# Patient Record
Sex: Female | Born: 1935 | Race: White | Hispanic: No | Smoking: Former smoker
Health system: Southern US, Community
[De-identification: ages and names within clinical notes are randomized; demographics above are authoritative.]

## PROBLEM LIST (undated history)

## (undated) DIAGNOSIS — K219 Gastro-esophageal reflux disease without esophagitis: Secondary | ICD-10-CM

## (undated) DIAGNOSIS — K589 Irritable bowel syndrome without diarrhea: Secondary | ICD-10-CM

## (undated) DIAGNOSIS — I2510 Atherosclerotic heart disease of native coronary artery without angina pectoris: Secondary | ICD-10-CM

## (undated) DIAGNOSIS — F0390 Unspecified dementia without behavioral disturbance: Secondary | ICD-10-CM

## (undated) DIAGNOSIS — G250 Essential tremor: Secondary | ICD-10-CM

## (undated) DIAGNOSIS — I10 Essential (primary) hypertension: Secondary | ICD-10-CM

## (undated) DIAGNOSIS — L409 Psoriasis, unspecified: Secondary | ICD-10-CM

## (undated) DIAGNOSIS — I4891 Unspecified atrial fibrillation: Secondary | ICD-10-CM

## (undated) DIAGNOSIS — E785 Hyperlipidemia, unspecified: Secondary | ICD-10-CM

## (undated) HISTORY — DX: Unspecified dementia, unspecified severity, without behavioral disturbance, psychotic disturbance, mood disturbance, and anxiety: F03.90

## (undated) HISTORY — PX: EYE SURGERY: SHX253

## (undated) HISTORY — PX: OTHER SURGICAL HISTORY: SHX169

## (undated) HISTORY — DX: Gastro-esophageal reflux disease without esophagitis: K21.9

## (undated) HISTORY — PX: DOPPLER ECHOCARDIOGRAPHY: SHX263

## (undated) HISTORY — DX: Hyperlipidemia, unspecified: E78.5

## (undated) HISTORY — DX: Atherosclerotic heart disease of native coronary artery without angina pectoris: I25.10

## (undated) HISTORY — PX: CARDIAC SURGERY: SHX584

## (undated) HISTORY — PX: ROTATOR CUFF REPAIR: SHX139

## (undated) HISTORY — DX: Essential tremor: G25.0

---

## 1992-05-17 HISTORY — PX: CORONARY ARTERY BYPASS GRAFT: SHX141

## 1999-12-11 ENCOUNTER — Encounter: Admission: RE | Admit: 1999-12-11 | Discharge: 1999-12-11 | Payer: Self-pay | Admitting: Obstetrics and Gynecology

## 1999-12-11 ENCOUNTER — Encounter: Payer: Self-pay | Admitting: Obstetrics and Gynecology

## 1999-12-30 ENCOUNTER — Ambulatory Visit (HOSPITAL_COMMUNITY): Admission: RE | Admit: 1999-12-30 | Discharge: 1999-12-30 | Payer: Self-pay | Admitting: Family Medicine

## 2000-09-29 ENCOUNTER — Encounter: Admission: RE | Admit: 2000-09-29 | Discharge: 2000-09-29 | Payer: Self-pay | Admitting: Orthopedic Surgery

## 2000-10-04 ENCOUNTER — Encounter: Admission: RE | Admit: 2000-10-04 | Discharge: 2000-10-04 | Payer: Self-pay | Admitting: Orthopedic Surgery

## 2000-10-04 ENCOUNTER — Encounter: Payer: Self-pay | Admitting: Orthopedic Surgery

## 2001-02-17 ENCOUNTER — Encounter: Admission: RE | Admit: 2001-02-17 | Discharge: 2001-02-17 | Payer: Self-pay | Admitting: Family Medicine

## 2001-02-17 ENCOUNTER — Encounter: Payer: Self-pay | Admitting: Family Medicine

## 2001-02-27 ENCOUNTER — Encounter: Payer: Self-pay | Admitting: Orthopedic Surgery

## 2001-02-27 ENCOUNTER — Encounter: Admission: RE | Admit: 2001-02-27 | Discharge: 2001-02-27 | Payer: Self-pay | Admitting: Orthopedic Surgery

## 2001-02-28 ENCOUNTER — Ambulatory Visit (HOSPITAL_BASED_OUTPATIENT_CLINIC_OR_DEPARTMENT_OTHER): Admission: RE | Admit: 2001-02-28 | Discharge: 2001-03-01 | Payer: Self-pay | Admitting: Orthopedic Surgery

## 2001-03-08 ENCOUNTER — Encounter: Admission: RE | Admit: 2001-03-08 | Discharge: 2001-05-01 | Payer: Self-pay | Admitting: Orthopedic Surgery

## 2002-02-23 ENCOUNTER — Encounter: Admission: RE | Admit: 2002-02-23 | Discharge: 2002-02-23 | Payer: Self-pay | Admitting: Family Medicine

## 2002-02-23 ENCOUNTER — Encounter: Payer: Self-pay | Admitting: Family Medicine

## 2002-05-31 ENCOUNTER — Ambulatory Visit (HOSPITAL_COMMUNITY): Admission: RE | Admit: 2002-05-31 | Discharge: 2002-05-31 | Payer: Self-pay | Admitting: *Deleted

## 2003-06-11 ENCOUNTER — Other Ambulatory Visit: Admission: RE | Admit: 2003-06-11 | Discharge: 2003-06-11 | Payer: Self-pay | Admitting: Obstetrics & Gynecology

## 2004-01-10 ENCOUNTER — Encounter: Admission: RE | Admit: 2004-01-10 | Discharge: 2004-01-10 | Payer: Self-pay | Admitting: Orthopedic Surgery

## 2004-03-10 ENCOUNTER — Encounter: Admission: RE | Admit: 2004-03-10 | Discharge: 2004-03-17 | Payer: Self-pay | Admitting: Orthopedic Surgery

## 2006-11-04 ENCOUNTER — Other Ambulatory Visit: Admission: RE | Admit: 2006-11-04 | Discharge: 2006-11-04 | Payer: Self-pay | Admitting: Family Medicine

## 2006-11-22 ENCOUNTER — Inpatient Hospital Stay (HOSPITAL_COMMUNITY): Admission: EM | Admit: 2006-11-22 | Discharge: 2006-11-25 | Payer: Self-pay | Admitting: Emergency Medicine

## 2006-11-23 ENCOUNTER — Ambulatory Visit: Payer: Self-pay | Admitting: Vascular Surgery

## 2006-11-23 ENCOUNTER — Encounter (INDEPENDENT_AMBULATORY_CARE_PROVIDER_SITE_OTHER): Payer: Self-pay | Admitting: Internal Medicine

## 2006-12-28 HISTORY — PX: OTHER SURGICAL HISTORY: SHX169

## 2007-07-14 ENCOUNTER — Encounter: Admission: RE | Admit: 2007-07-14 | Discharge: 2007-07-14 | Payer: Self-pay | Admitting: Orthopedic Surgery

## 2008-02-15 ENCOUNTER — Ambulatory Visit (HOSPITAL_COMMUNITY): Admission: RE | Admit: 2008-02-15 | Discharge: 2008-02-15 | Payer: Self-pay | Admitting: Obstetrics and Gynecology

## 2008-10-25 ENCOUNTER — Encounter: Admission: RE | Admit: 2008-10-25 | Discharge: 2008-10-25 | Payer: Self-pay | Admitting: Allergy and Immunology

## 2008-12-26 ENCOUNTER — Encounter: Admission: RE | Admit: 2008-12-26 | Discharge: 2009-02-10 | Payer: Self-pay | Admitting: Specialist

## 2009-07-12 ENCOUNTER — Inpatient Hospital Stay (HOSPITAL_COMMUNITY): Admission: EM | Admit: 2009-07-12 | Discharge: 2009-07-14 | Payer: Self-pay | Admitting: Emergency Medicine

## 2009-07-14 ENCOUNTER — Encounter (INDEPENDENT_AMBULATORY_CARE_PROVIDER_SITE_OTHER): Payer: Self-pay | Admitting: Internal Medicine

## 2009-07-14 ENCOUNTER — Ambulatory Visit: Payer: Self-pay | Admitting: Vascular Surgery

## 2009-07-14 ENCOUNTER — Encounter: Payer: Self-pay | Admitting: Internal Medicine

## 2010-08-05 LAB — DIFFERENTIAL
Basophils Absolute: 0 10*3/uL (ref 0.0–0.1)
Basophils Relative: 0 % (ref 0–1)
Eosinophils Absolute: 0.2 10*3/uL (ref 0.0–0.7)
Eosinophils Relative: 2 % (ref 0–5)
Lymphocytes Relative: 22 % (ref 12–46)
Lymphs Abs: 2.2 10*3/uL (ref 0.7–4.0)
Monocytes Absolute: 1 10*3/uL (ref 0.1–1.0)
Monocytes Relative: 10 % (ref 3–12)
Neutro Abs: 6.6 10*3/uL (ref 1.7–7.7)
Neutrophils Relative %: 65 % (ref 43–77)

## 2010-08-05 LAB — CK TOTAL AND CKMB (NOT AT ARMC)
CK, MB: 1.5 ng/mL (ref 0.3–4.0)
CK, MB: 2 ng/mL (ref 0.3–4.0)
CK, MB: 2.3 ng/mL (ref 0.3–4.0)
Relative Index: 1.5 (ref 0.0–2.5)
Relative Index: 1.7 (ref 0.0–2.5)
Relative Index: INVALID (ref 0.0–2.5)
Total CK: 134 U/L (ref 7–177)
Total CK: 139 U/L (ref 7–177)
Total CK: 99 U/L (ref 7–177)

## 2010-08-05 LAB — CLOSTRIDIUM DIFFICILE EIA
C difficile Toxins A+B, EIA: NEGATIVE
C difficile Toxins A+B, EIA: NEGATIVE
C difficile Toxins A+B, EIA: NEGATIVE

## 2010-08-05 LAB — BASIC METABOLIC PANEL
BUN: 21 mg/dL (ref 6–23)
CO2: 22 mEq/L (ref 19–32)
Calcium: 8.6 mg/dL (ref 8.4–10.5)
Chloride: 109 mEq/L (ref 96–112)
Creatinine, Ser: 0.83 mg/dL (ref 0.4–1.2)
GFR calc Af Amer: >60 mL/min (ref >60)
GFR calc non Af Amer: >60 mL/min (ref >60)
Glucose, Bld: 98 mg/dL (ref 70–99)
Potassium: 4.3 mEq/L (ref 3.5–5.1)
Sodium: 139 mEq/L (ref 135–145)

## 2010-08-05 LAB — URINALYSIS, ROUTINE W REFLEX MICROSCOPIC
Bilirubin Urine: NEGATIVE
Glucose, UA: NEGATIVE mg/dL
Ketones, ur: NEGATIVE mg/dL
Nitrite: NEGATIVE
Protein, ur: NEGATIVE mg/dL
Specific Gravity, Urine: 1.01 (ref 1.005–1.030)
Urobilinogen, UA: 0.2 mg/dL (ref 0.0–1.0)
pH: 5 (ref 5.0–8.0)

## 2010-08-05 LAB — CBC
HCT: 37.5 % (ref 36.0–46.0)
Hemoglobin: 12.9 g/dL (ref 12.0–15.0)
MCHC: 34.3 g/dL (ref 30.0–36.0)
MCV: 88.5 fL (ref 78.0–100.0)
Platelets: 221 10*3/uL (ref 150–400)
RBC: 4.24 MIL/uL (ref 3.87–5.11)
RDW: 15.3 % (ref 11.5–15.5)
WBC: 10.1 10*3/uL (ref 4.0–10.5)

## 2010-08-05 LAB — POCT CARDIAC MARKERS
CKMB, poc: 1.3 ng/mL (ref 1.0–8.0)
CKMB, poc: 2.8 ng/mL (ref 1.0–8.0)
Myoglobin, poc: 76.9 ng/mL (ref 12–200)
Myoglobin, poc: 77.8 ng/mL (ref 12–200)
Troponin i, poc: <0.05 ng/mL (ref 0.00–0.09)
Troponin i, poc: <0.05 ng/mL (ref 0.00–0.09)

## 2010-08-05 LAB — URINE CULTURE
Colony Count: NO GROWTH
Culture: NO GROWTH
Special Requests: NEGATIVE

## 2010-08-05 LAB — GLUCOSE, CAPILLARY
Glucose-Capillary: 106 mg/dL — ABNORMAL HIGH (ref 70–99)
Glucose-Capillary: 132 mg/dL — ABNORMAL HIGH (ref 70–99)

## 2010-08-05 LAB — HEMOGLOBIN A1C
Hgb A1c MFr Bld: 5.3 % (ref 4.6–6.1)
Mean Plasma Glucose: 105 mg/dL

## 2010-08-05 LAB — TROPONIN I
Troponin I: 0.02 ng/mL (ref 0.00–0.06)
Troponin I: <0.01 ng/mL (ref 0.00–0.06)
Troponin I: <0.01 ng/mL (ref 0.00–0.06)

## 2010-08-05 LAB — ETHANOL: Alcohol, Ethyl (B): <5 mg/dL (ref 0–10)

## 2010-08-05 LAB — TSH: TSH: 1.111 u[IU]/mL (ref 0.350–4.500)

## 2010-08-05 LAB — URINE MICROSCOPIC-ADD ON

## 2010-09-29 NOTE — H&P (Signed)
Tammy [name]                 ACCOUNT NO.:  [id number]   MEDICAL RECORD NO.:  [id number]          PATIENT TYPE:  INP   LOCATION:  1424                         FACILITY:  [facility]   PHYSICIAN:  Tammy [name], M.D.DATE OF BIRTH:  [birth year]   DATE OF ADMISSION:  11/22/2006  DATE OF DISCHARGE:                              HISTORY & PHYSICAL   ATTENDING PHYSICIAN:  Tammy [name], M.D.   PRIMARY CARE PHYSICIAN:  Tammy [name], M.D.   CHIEF COMPLAINT:  Weakness.   HISTORY OF PRESENT ILLNESS:  Patient is a 77 year old white female with  a past medical history of heavy binge-drinking alcohol, and hypertension  who reportedly has been having problems with falls for the past few days  to weeks prior to the incident that occurred last Thursday.  She has  been evaluated, and it is unclear at this time what the results of the  evaluations are, whether it is deconditioning, weakness, orthostatics,  or secondary to secretive alcohol abuse.  However, she has been seeing  Dr. Nicki [name], who is concerned that the patient is having sleep  apnea, which may or may not be related to these falls, because it has  been reported that her heart rate and blood pressure have been elevating  quite highly in the middle of the night.  Patient tells me in regards to  her alcohol abuse, that she may go several days without drinking but can  drink up to two bottles of wine a day.  Last Thursday, she says that she  got weak and she fell.  She did not actually pass out, but she was so  weak she was unable to stand and actually remained in the sitting  position for several days, unable to stand.  A workman who saw her from  outside the house, broke into the home and was able to get her up in the  bed, called family, who stayed with the family.  Said she was feeling  better but then reportedly had another fall, which finally made them  bring her into the emergency room.  In the emergency room, she was  noted  to have extensive bruising but x-ray showed no evidence of any type of  fractures.   Labs of note were a sodium of 126, potassium 3, BUN 45, creatinine 1.6.  She was also found to have a glucose of 160.  Albumin level was low at  2.6, and a CPK level was elevated in the mid 400s, although the MB and  troponin were normal.   Currently, the patient is doing well.  She complains of some generalized  soreness but is feeling better.  She denies any headaches, vision  changes, dysphagia, chest pain, palpitations, shortness of breath,  wheeze, cough, abdominal pain, hematuria, dysuria, constipation,  diarrhea.  She has general significant soreness all throughout her body,  most notably in her leg.  Review of systems is otherwise negative, and  described as above.   Patient's past medical history includes alcohol abuse and hypertension.  Possible sleep apnea, although she has yet to  complete an evaluation for  this.   MEDICATIONS:  Patient is on clobetasol, Dovonex, Fosamax, metoprolol,  Oxytrol, quinapril, and Zocor.   She has allergies to PENICILLIN.   SOCIAL HISTORY:  She denies any tobacco or drug use.  She does admit to  binge drinking, and when she does drink, she drinks about two bottles of  wine a day.   FAMILY HISTORY:  Noncontributory.   PHYSICAL EXAMINATION:  VITALS ON ADMISSION:  Temp 97.8, heart rate 97,  blood pressure 85/47 initially but then improved with IV fluids to  136/82, respirations 22, O2 sat 98% on room air.  GENERAL:  Patient is alert and oriented x3.  No apparent distress.  HEENT:  Normocephalic.  She has significant bruising all across her face  all the way down through the right side of her body.  Her mucous  membranes are dry.  NECK:  No carotid bruits.  HEART:  Regular rate and rhythm.  S1 and S2.  LUNGS:  Clear to auscultation bilaterally.  ABDOMEN:  Soft, nontender, nondistended.  Positive bowel sounds.  EXTREMITIES:  Quite sore to touch.   Difficult to screen for Denna Haggard' sign  with her generalized soreness.   LAB WORK:  White count is 7.9, H&H 10.8 and 30.9.  MCV of 83.  Platelet  count 251, 78% neutrophils.  Sodium 126, potassium 3, chloride 88,  bicarb 25, BUN 45, creatinine 1.6, glucose 159.  LFTs are noted for an  albumin of 2.6.  CPK 455, MB 4.2, troponin I less than 0.05.   X-RAY:  The x-ray of the head showed a few cerebral and cerebellar  atrophies but no acute abnormality.  No fractures of the facial bones,  paranasal sinuses, orbits.  C-spine normal.  Some bullous changes of the  lungs but no signs of any cervical fractures or otherwise.   ASSESSMENT/PLAN:  1. Fall with secondary weakness and mild rhabdomyolysis:  In regards      to the patient's rhabdomyolysis, will plan to aggressively hydrate      and follow CPK levels.  2. Acute renal failure:  See #1.  3. Fall:  The cause of these falls is unclear at this time.  She is      certainly weakened, however, post fall secondary to her      rhabdomyolysis and muscle enzyme breakdown.  We will plan to get PT      evaluation and out of bed with assistance, pain control.  Will also      check a bilateral lower extremity Doppler to rule out deep venous      thrombosis.  In regards to the fall itself, this may be from      alcohol drinking, which is the #1 possibility.  However, it may be      infection versus hypotension from blood pressure medications versus      a possible arrhythmia.  Given the fact that she has previously seen      Dr. Nicki [name], and he is screening her for sleep apnea, will      plan to at least place her on telemetry x24 hours to insure there      are no episodes of arrhythmia.  4. Alcohol abuse:  Patient tells me her last drink was about five days      ago for heavy alcohol, but she does admit      that after her first fall when she was moved back to her bed, she  drank a few beers, so while I am not going to put her on Ativan       withdrawal protocol, I have p.r.n. IV Ativan ordered in case she      does show signs of DT's.  5. Protein calorie malnutrition:  I feel that this is more chronic      secondary to her alcohol abuse.      Tammy [name], M.D.  Electronically Signed     SKK/MEDQ  D:  11/22/2006  T:  11/22/2006  Job:  [id number]   cc:   Tammy [name], M.D.  Fax: [phone]   Tammy [name], M.D.  Fax: [phone]

## 2010-09-29 NOTE — Discharge Summary (Signed)
Tammy [name], Tammy [name]                 ACCOUNT NO.:  [id number]   MEDICAL RECORD NO.:  [id number]          PATIENT TYPE:  INP   LOCATION:  1424                         [facility]ACILITY[facility]   PHYSICIAN:  Corinna L. Lendell Caprice, MDDATE OF BIRTH:  1930-10-13   DATE OF ADMISSION:  11/22/2006  DATE OF DISCHARGE:  11/25/2006                               DISCHARGE SUMMARY   DISCHARGE DIAGNOSES:  1. Recurrent falls and weakness.  2. Mild rhabdomyolysis.  3. Acute renal insufficiency, prerenal azotemia, dehydration.  4. Hyponatremia secondary to alcohol.  5. Hypokalemia secondary to alcohol.  6. Alcohol abuse.  7. Protein calorie malnutrition.  8. Multiple abrasions and contusions from frequent falling.  9. Anemia.   DISCHARGE MEDICATIONS:  Discharge medications are the same as upon  admission:  1. Clobetasol topical as needed.  2. Fosamax weekly.  3. Metoprolol 75 mg twice a day.  4. Oxytrol transdermal.  5. Quinapril, dose unknown.  6. Zocor 40 mg a day.   FOLLOW UP:  Follow up with Dr. Tenny Craw in 1-2 weeks at which time basic  metabolic panel and a total CPK should be checked as well as screening  for continued alcohol use.   DIET:  Regular.   ACTIVITY:  Fall precautions.  I am arranging home physical therapy, home  occupational therapy, and Child psychotherapist.   CONDITION ON DISCHARGE:  Stable.   CONSULTATIONS:  None.   PROCEDURES:  None.   PERTINENT LABORATORY DATA:  On admission, her sodium was 126, potassium  3.0, chloride 88, bicarbonate 25, glucose 159, BUN 45, creatinine 1.6,  albumin 2.6.  SGOT 51, SGPT 48, otherwise normal.  At discharge, her  potassium is 4.0, sodium 138, glucose 130, magnesium 1.8.  Initial CBC  was significant for a hemoglobin of 10.8, hematocrit 30.9.  Hemoglobin  at discharge is 8.8 after hydration, normal platelet count.  Initial  myoglobin was 400.  Initial total CPK was 475.  At discharge, her  creatinine was 0.73 and BUN was 12.  B12 and folate levels  were  adequate.  TSH 0.764.  Urinalysis revealed trace leukocyte esterase,  negative nitrite, negative blood, negative ketones, negative glucose,  RPR nonreactive.   SPECIAL STUDIES AND RADIOLOGY:  EKG showed a lot of baseline artifact  but showed normal sinus rhythm.  Head CT without contrast showed  diffusely enlarged ventricles and subarachnoid spaces.  No fracture,  hemorrhage.  Maxillofacial CT showed no fracture.  CT of the C-spine  showed mild reversal of the normal cervical lordosis.  No fracture or  subluxation.  Mild degenerative changes.  Chest CT showed nothing acute.  Abdominal CT showed multiple sigmoid colon diverticula, nothing acute,  cholelithiasis, and a small amount of pericholecystic fluid.   HISTORY AND HOSPITAL COURSE:  Please see H&P for complete admission  details.  The patient is a 77 year old white female patient of Dr. Tenny Craw  who admits to heavy drinking and had been falling a lot recently.  She  lives alone and apparently fell and was unable to get out of bathtub.  Finally, a handyman found her and got her  into bed.  Apparently she fell  again, and they finally brought her to the emergency room several days  later.  She admitted drinking two bottles of wine a day and felt that  she went through DTs while stuck in the bathtub.  Her vital signs were  significant for an initial blood pressure 85/47 which normalized with IV  fluids.  She had bruising all over her face, arms, right side of her  body, legs and a subacute-appearing abrasion over her right cheek.  She  was alert and oriented.  Dry mucous membranes.  She was admitted to  telemetry and given IV fluids for her dehydration and her  rhabdomyolysis.  Her CPKs decreased.  Her sodium normalized.  Her  potassium was repleted.  She worked with physical therapy and  occupational therapy which recommended skilled nursing facility versus  home health physical therapy and occupational therapy.  She refused   placement.  I am concerned about her living alone, and I have told this  to her and her friend Becky Sax.  She asked me not to call her  daughter, so I did not, but apparently her friend is able to arrange at  least over a few days supervision either at the patient's house or the  patient may stay at her house.  She had no signs of DTs during  hospitalization.  All her outpatient medications were held initially.  I  do not feel that her mildly elevated CPKs and myoglobin were a result of  the Zocor, so I believe she can start this back, but she will need a  repeat CPK in a few weeks and if elevated consider stopping the Zocor.  I am confident that her mild rhabdomyolysis was from having been stuck  in the bathtub for 2 days and multiple other falls.  I asked the social  worker to discuss options with the patient with regard to her drinking  history.  She was not receptive to any suggestions of treatment or AA.  She minimized her drinking and rationalized that it, but I did encourage  her to abstain altogether.  She was slightly anemic on admission, and  with hydration her hemoglobin dropped, but she has no active bleeding.  I have ordered stool Hemoccults, but none have been done.  This may need  to be worked up as an outpatient as well.  The patient's outpatient  medications were able to be resumed.  At this time she has stable vital  signs, is ambulating on her own, and is stable for discharge.      Corinna L. Lendell Caprice, MD  Electronically Signed     CLS/MEDQ  D:  11/25/2006  T:  11/27/2006  Job:  [id number]   cc:   C. Duane Lope, M.D.  Fax: [phone]   Nicki Guadalajara, M.D.  Fax: [phone]

## 2010-10-02 NOTE — Op Note (Signed)
   NAME:  Tammy [name], Tammy [name]                           ACCOUNT NO.:  [id number]   MEDICAL RECORD NO.:  [id number]                   PATIENT TYPE:  AMB   LOCATION:  ENDO                                 FACILITY:  [facility]   PHYSICIAN:  Georgiana Spinner, M.D.                 DATE OF BIRTH:  09-29-1930   DATE OF PROCEDURE:  05/31/2002  DATE OF DISCHARGE:                                 OPERATIVE REPORT   PROCEDURE PERFORMED:  Colonoscopy.   ENDOSCOPIST:  Georgiana Spinner, M.D.   INDICATIONS FOR PROCEDURE:  Hemoccult positivity.   ANESTHESIA:  Demerol 70 mg, Versed 7 mg.   DESCRIPTION OF PROCEDURE:  With the patient mildly sedated in the left  lateral decubitus position, the Olympus video colonoscope was inserted in  the rectum and passed under direct vision to the cecum, identified by the  ileocecal valve and appendiceal orifice.  From this point the colonoscope  was slowly withdrawn taking circumferential views of the entire colonic  mucosa stopping only to photograph diverticulum seen in the sigmoid colon as  we reached the rectum which appeared normal on direct and showed hemorrhoids  on retroflex view.  The endoscope was straightened and withdrawn.  The  patient's vital signs and pulse oximeter remained stable.  The patient  tolerated the procedure well without apparent complications.   FINDINGS:  Internal hemorrhoids, diverticulosis of the sigmoid colon.  Otherwise unremarkable examination.   PLAN:  Repeat examination in five to 10 years.                                                  Georgiana Spinner, M.D.    GMO/MEDQ  D:  05/31/2002  T:  05/31/2002  Job:  [id number]

## 2010-10-02 NOTE — Op Note (Signed)
[place]  Patient:    Tammy [name], Tammy [name] Visit Number: [id number] MRN: [id number]          Service Type: DSU Location: [facility] Attending Physician:  Cornell Barman Dictated by:   Lenard Galloway Chaney Malling, M.D. Proc. Date: 02/28/01 Admit Date:  02/28/2001                             Operative Report  PREOPERATIVE DIAGNOSIS:  Rotator cuff tear, right shoulder.  POSTOPERATIVE DIAGNOSIS:  Rotator cuff tear, right shoulder.  OPERATION PERFORMED:  Diagnostic arthroscopy, right shoulder; debridement of frayed biceps tendon; open acromioplasty and open repair of rotator cuff tear right shoulder using a 6.5 mm biocorkscrew suture anchor from [employer].  SURGEON:  Lenard Galloway. Chaney Malling, M.D.  ANESTHESIA:  General.  DESCRIPTION OF PROCEDURE:  The patient was placed on the operating table in semisitting position with the right arm hanging down.  The retrograde ureteropyelogram and shoulder was prepped with DuraPrep and draped out in the usual manner.  The arthroscope was placed in the posterior portal and the glenohumeral joint was visualized.  The articular cartilage over the humeral head and the glenoid was absolutely normal.  There was slight fraying of the anterior labrum and as this approached the biceps there was some fraying but the base of the biceps was quite stable.  It was palpated with a probe and no SLAP lesion or other systemic abnormalities were noted.  This are was debrided somewhat with intra-articular shaver.  Attention was then turned to the rotator cuff.  Where the rotator cuff inserted into the humeral head, an obvious tear could be seen.  The arthroscope was then passed to the subacromial space.  The subacromial space was cleared and again the rotator cuff tear could be seen.  This could be palpated through an anterolateral portal and the tear of the cuff elevated allowing the probe to enter the glenohumeral joint itself.  Excellent  positioning of the tear was achieved. The MRI did not show a tear.  At this point it was felt that an open repair of the rotator cuff was indicated.  An incision was made over the anterolateral aspect of the shoulder.  A mini incision was used.  A self-retaining retractor was put in place.  Bleeders were coagulated.  Deltoid fibers were released off the anterior aspect of the acromion only.  The subacromial space was then opened and clearly visualized. The bursa was resected. The tear was clearly seen.  Using a power saw, a generous Neer anterior one third acromioplasty was done.  This gave excellent access to the shoulder joint itself.  The edges of the tear could be approximated and closed anatomically.  An Arthrex biocorkscrew with suture anchor was then inserted into the humeral head.  The two limbs of the sutures brought to the edge of the tear and this pulled down to an anatomic position and sutured.  A watertight closure was achieved.  Excellent reconstruction of the rotator cuff was achieved very nicely.  Throughout the procedure, the shoulder was irrigated with copious amounts of antibiotic solution.  The deltoid fibers were then reattached with heavy Vicryl sutures.  The anterior aspect of the acromion.  The subcutaneous tissues were closed with 2-0 Vicryl and stainless steel staples were used to close the skin.  The area of surgery was then infiltrated with Marcaine.  Sterile dressings were applied.  The patient was transferred to  the recovery room in excellent condition. Technically this procedure went extremely well.  DRAINS:  None.  COMPLICATIONS:  None.  The patient will stay overnight for pain control. Dictated by:   Lenard Galloway Chaney Malling, M.D. Attending Physician:  Cornell Barman DD:  02/28/01 TD:  02/28/01 Job: [id number]

## 2011-03-02 LAB — BASIC METABOLIC PANEL
BUN: 12
BUN: 29 — ABNORMAL HIGH
CO2: 25
CO2: 27
Calcium: 7.8 — ABNORMAL LOW
Calcium: 7.8 — ABNORMAL LOW
Chloride: 100
Chloride: 110
Creatinine, Ser: 0.73
Creatinine, Ser: 0.96
GFR calc Af Amer: >60
GFR calc Af Amer: >60
GFR calc non Af Amer: 57 — ABNORMAL LOW
GFR calc non Af Amer: >60
Glucose, Bld: 126 — ABNORMAL HIGH
Glucose, Bld: 130 — ABNORMAL HIGH
Potassium: 2.8 — ABNORMAL LOW
Potassium: 4
Sodium: 132 — ABNORMAL LOW
Sodium: 138

## 2011-03-02 LAB — CBC
HCT: 23.7 — ABNORMAL LOW
HCT: 30.9 — ABNORMAL LOW
Hemoglobin: 10.8 — ABNORMAL LOW
Hemoglobin: 8.3 — ABNORMAL LOW
MCHC: 34.9
MCHC: 35.2
MCV: 83.2
MCV: 83.4
Platelets: 176
Platelets: 251
RBC: 2.85 — ABNORMAL LOW
RBC: 3.7 — ABNORMAL LOW
RDW: 14.8 — ABNORMAL HIGH
RDW: 14.9 — ABNORMAL HIGH
WBC: 4.8
WBC: 7.9

## 2011-03-02 LAB — URINALYSIS, ROUTINE W REFLEX MICROSCOPIC
Bilirubin Urine: NEGATIVE
Glucose, UA: NEGATIVE
Hgb urine dipstick: NEGATIVE
Ketones, ur: NEGATIVE
Nitrite: NEGATIVE
Protein, ur: NEGATIVE
Specific Gravity, Urine: 1.008
Urobilinogen, UA: 0.2
pH: 5.5

## 2011-03-02 LAB — DIFFERENTIAL
Basophils Absolute: 0
Basophils Relative: 1
Eosinophils Absolute: 0
Eosinophils Relative: 0
Lymphocytes Relative: 6 — ABNORMAL LOW
Lymphs Abs: 0.5 — ABNORMAL LOW
Monocytes Absolute: 1.1 — ABNORMAL HIGH
Monocytes Relative: 15 — ABNORMAL HIGH
Neutro Abs: 6.2
Neutrophils Relative %: 78 — ABNORMAL HIGH

## 2011-03-02 LAB — COMPREHENSIVE METABOLIC PANEL
ALT: 48 — ABNORMAL HIGH
AST: 51 — ABNORMAL HIGH
Albumin: 2.6 — ABNORMAL LOW
Alkaline Phosphatase: 83
BUN: 45 — ABNORMAL HIGH
CO2: 25
Calcium: 8.7
Chloride: 88 — ABNORMAL LOW
Creatinine, Ser: 1.6 — ABNORMAL HIGH
GFR calc Af Amer: 38 — ABNORMAL LOW
GFR calc non Af Amer: 32 — ABNORMAL LOW
Glucose, Bld: 159 — ABNORMAL HIGH
Potassium: 3 — ABNORMAL LOW
Sodium: 126 — ABNORMAL LOW
Total Bilirubin: 1
Total Protein: 6.1

## 2011-03-02 LAB — URINE MICROSCOPIC-ADD ON

## 2011-03-02 LAB — POCT CARDIAC MARKERS
CKMB, poc: 4.2
CKMB, poc: 6.1
Myoglobin, poc: 400
Myoglobin, poc: 455
Operator id: 4661
Operator id: 4661
Troponin i, poc: <0.05
Troponin i, poc: <0.05

## 2011-03-02 LAB — CK TOTAL AND CKMB (NOT AT ARMC)
CK, MB: 4.4 — ABNORMAL HIGH
Relative Index: 1.7
Total CK: 252 — ABNORMAL HIGH

## 2011-03-02 LAB — MAGNESIUM: Magnesium: 1.8

## 2011-03-02 LAB — CK: Total CK: 475 — ABNORMAL HIGH

## 2011-03-02 LAB — HEMOGLOBIN AND HEMATOCRIT, BLOOD
HCT: 25 — ABNORMAL LOW
Hemoglobin: 8.8 — ABNORMAL LOW

## 2011-03-02 LAB — VITAMIN B12: Vitamin B-12: 1120 — ABNORMAL HIGH (ref 211–911)

## 2011-03-02 LAB — RPR: RPR Ser Ql: NONREACTIVE

## 2011-03-02 LAB — FOLATE RBC: RBC Folate: 1559 — ABNORMAL HIGH

## 2011-03-02 LAB — TSH: TSH: 0.764

## 2011-05-06 ENCOUNTER — Emergency Department (HOSPITAL_COMMUNITY)
Admission: EM | Admit: 2011-05-06 | Discharge: 2011-05-06 | Disposition: A | Discharge status: Home / Self Care | Payer: Medicare Other | Attending: Emergency Medicine | Admitting: Emergency Medicine

## 2011-05-06 ENCOUNTER — Emergency Department (HOSPITAL_COMMUNITY): Payer: Medicare Other

## 2011-05-06 DIAGNOSIS — S39012A Strain of muscle, fascia and tendon of lower back, initial encounter: Secondary | ICD-10-CM

## 2011-05-06 DIAGNOSIS — S52509A Unspecified fracture of the lower end of unspecified radius, initial encounter for closed fracture: Secondary | ICD-10-CM | POA: Insufficient documentation

## 2011-05-06 DIAGNOSIS — W010XXA Fall on same level from slipping, tripping and stumbling without subsequent striking against object, initial encounter: Secondary | ICD-10-CM | POA: Insufficient documentation

## 2011-05-06 DIAGNOSIS — E7800 Pure hypercholesterolemia, unspecified: Secondary | ICD-10-CM | POA: Insufficient documentation

## 2011-05-06 DIAGNOSIS — I10 Essential (primary) hypertension: Secondary | ICD-10-CM | POA: Insufficient documentation

## 2011-05-06 DIAGNOSIS — M25539 Pain in unspecified wrist: Secondary | ICD-10-CM | POA: Insufficient documentation

## 2011-05-06 DIAGNOSIS — S52609A Unspecified fracture of lower end of unspecified ulna, initial encounter for closed fracture: Secondary | ICD-10-CM | POA: Insufficient documentation

## 2011-05-06 DIAGNOSIS — M545 Low back pain, unspecified: Secondary | ICD-10-CM | POA: Insufficient documentation

## 2011-05-06 DIAGNOSIS — M25559 Pain in unspecified hip: Secondary | ICD-10-CM | POA: Insufficient documentation

## 2011-05-06 DIAGNOSIS — K219 Gastro-esophageal reflux disease without esophagitis: Secondary | ICD-10-CM | POA: Insufficient documentation

## 2011-05-06 DIAGNOSIS — J45909 Unspecified asthma, uncomplicated: Secondary | ICD-10-CM | POA: Insufficient documentation

## 2011-05-06 DIAGNOSIS — IMO0001 Reserved for inherently not codable concepts without codable children: Secondary | ICD-10-CM | POA: Insufficient documentation

## 2011-05-06 DIAGNOSIS — M25439 Effusion, unspecified wrist: Secondary | ICD-10-CM | POA: Insufficient documentation

## 2011-05-06 DIAGNOSIS — IMO0002 Reserved for concepts with insufficient information to code with codable children: Secondary | ICD-10-CM | POA: Insufficient documentation

## 2011-05-06 DIAGNOSIS — S52501A Unspecified fracture of the lower end of right radius, initial encounter for closed fracture: Secondary | ICD-10-CM

## 2011-05-06 DIAGNOSIS — W19XXXA Unspecified fall, initial encounter: Secondary | ICD-10-CM

## 2011-05-06 HISTORY — DX: Gastro-esophageal reflux disease without esophagitis: K21.9

## 2011-05-06 HISTORY — DX: Psoriasis, unspecified: L40.9

## 2011-05-06 HISTORY — DX: Essential (primary) hypertension: I10

## 2011-05-06 MED ORDER — HYDROCODONE-ACETAMINOPHEN 5-325 MG PO TABS
ORAL_TABLET | ORAL | Status: AC
  Administered 2011-05-06: 1 via ORAL
  Filled 2011-05-06: qty 1

## 2011-05-06 MED ORDER — HYDROCODONE-ACETAMINOPHEN 5-325 MG PO TABS
1.0000 | ORAL_TABLET | Freq: Once | ORAL | Status: AC
  Administered 2011-05-06: 1 via ORAL

## 2011-05-06 MED ORDER — HYDROCODONE-ACETAMINOPHEN 5-325 MG PO TABS: 1.0000 | ORAL_TABLET | ORAL | Status: AC | PRN

## 2011-05-06 NOTE — ED Notes (Signed)
R radial fx on xray...waiting for splinting

## 2011-05-06 NOTE — ED Provider Notes (Signed)
History     CSN: [id number]  Arrival date & time 05/06/11  1914   First MD Initiated Contact with Patient 05/06/11 [phone]      Chief Complaint  Patient presents with  . Fall  . Hip Pain  . Wrist Pain    (Consider location/radiation/quality/duration/timing/severity/associated sxs/prior treatment) Patient is a 77 y.o. female presenting with fall. The history is provided by the patient.  Fall The accident occurred 1 to 2 hours ago. The fall occurred while walking. She landed on a hard floor. There was no blood loss. The point of impact was the right hip and right wrist. The pain is present in the right wrist. The pain is at a severity of 7/10. The pain is moderate. She was ambulatory at the scene. Pertinent negatives include no bowel incontinence, no headaches and no tingling.  Pt states she is now having pain in right wrist and right buttocks. States she was able to get up after the fall. Denies head injury, denies headache, nausea, vomiting, abdominal pain. No other complaints.  Past Medical History  Diagnosis Date  . Hypertension   . Asthma   . Psoriasis   . High cholesterol   . Acid reflux     Past Surgical History  Procedure Date  . Cardiac surgery   . Eye surgery   . Rotator cuff repair     bilateral    No family history on file.  History  Substance Use Topics  . Smoking status: Not on file  . Smokeless tobacco: Not on file  . Alcohol Use: No    OB History    Grav Para Term Preterm Abortions TAB SAB Ect Mult Living                  Review of Systems  Constitutional: Negative.   HENT: Negative.   Eyes: Negative.   Respiratory: Negative.   Cardiovascular: Negative.   Gastrointestinal: Negative.  Negative for bowel incontinence.  Genitourinary: Negative.   Musculoskeletal: Positive for myalgias and joint swelling. Negative for back pain.  Skin: Positive for wound.  Neurological: Negative for dizziness, tingling, seizures, weakness, light-headedness and  headaches.  Psychiatric/Behavioral: Negative.     Allergies  Penicillins  Home Medications  No current outpatient prescriptions on file.  BP 163/64  Pulse 71  Temp(Src) 98.1 F (36.7 C) (Oral)  Resp 20  SpO2 97%  Physical Exam  Nursing note and vitals reviewed. Constitutional: She is oriented to person, place, and time. She appears well-developed and well-nourished. No distress.  HENT:  Head: Normocephalic and atraumatic.  Eyes: Conjunctivae and EOM are normal. Pupils are equal, round, and reactive to light.  Neck: Normal range of motion. Neck supple.  Cardiovascular: Normal rate, regular rhythm and normal heart sounds.   Pulmonary/Chest: Effort normal and breath sounds normal. No respiratory distress.  Abdominal: Soft. Bowel sounds are normal. There is no tenderness.  Musculoskeletal:       Tenderness to palpation over lumbar spine, No swelling, bruising, step offs. Pain can be reproduced with palpation over right buttocks into right hip. Pain with right hip flexion, internal and external rotation. Normal right knee. Swelling over right wrist. Small skin tear about 2cm to right dorsal wrist, hemostatic. Pain with palpation over right wrist, pain with flexion, extension, ulnar and radial deviation.  Neurological: She is alert and oriented to person, place, and time.       2+ and equal patellar reflexes bilaterally. Normal sensation of LE. Normal gait  Skin: Skin is warm and dry.  Psychiatric: She has a normal mood and affect.    ED Course  Procedures (including critical care time)  Pt denies getting dizzy, light headed, denies CP, SOB. States mechanical fall. Reports pain in the back, right hip, right wrist. Will get x-rays.  Dg Lumbar Spine Complete  05/06/2011  *RADIOLOGY REPORT*  Clinical Data: Status post fall  LUMBAR SPINE - COMPLETE 4+ VIEW  Comparison: 07/14/2007  Findings:  There is a mild curvature of the lumbar spine which is convex to the right.  Multilevel disc  space narrowing and ventral spurring is noted throughout the lumbar spine.  This is similar to previous exam.  A first degree anterolisthesis of L4 on L5 is noted.  There are no fractures or subluxations identified.  IMPRESSION:  1.  No acute findings. 2.  Lumbar spondylosis.  Original Report Authenticated By: Rosealee Albee, M.D.   Dg Wrist Complete Right  05/06/2011  *RADIOLOGY REPORT*  Clinical Data: Status post fall  RIGHT WRIST - COMPLETE 3+ VIEW  Comparison: None  Findings: There is a distal radius fracture.  There is mild lateral angulation of the distal fracture fragments.  No significant displacement.  Ulnar styloid fractures also noted.  Advanced degenerative changes are present within the basilar joint.  IMPRESSION:  1.  Distal radius fracture.  Per CMS PQRS reporting requirements (PQRS Measure 24): Given the patient's age of greater than 50 and the fracture site (hip, distal radius, or spine), the patient should be tested for osteoporosis using DXA, and the appropriate treatment considered based on the DXA results.  Original Report Authenticated By: Rosealee Albee, M.D.   Dg Hip Bilateral Vito Berger  05/06/2011  *RADIOLOGY REPORT*  Clinical Data: Status post fall  BILATERAL HIP WITH PELVIS - 4+ VIEW  Comparison: 11/22/2006  Findings: Both hips appear located.  There is no fracture or subluxation identified.  No fractures or subluxations.  No radiopaque foreign bodies or soft tissue calcifications.  Mild degenerative changes involve both hips.  Evidence of degenerative disc disease is noted within the lower lumbar spine.  IMPRESSION:  1.  No acute findings. 2.  Mild DJD.  Original Report Authenticated By: Rosealee Albee, M.D.   12:03 PM Right wrist distal radius/ulnar fracture.[name] Pt prefers to go to [facility]. In fact was trying to get in with them for last several weeks for back pain, but unable to get an appointment. Called [place], orthopedics who are not on call today,  refusing to take pt because she is not yet established. Spoke with Dr. Merlyn Lot, will see int he office for follow up. Pt splinted with volar wright splint, will d/c home with pain medications and follow up with Dr. Merlyn Lot.    MDM          Lottie Mussel, PA 05/06/11 1204

## 2011-05-06 NOTE — ED Notes (Signed)
Pt to xray

## 2011-05-06 NOTE — ED Provider Notes (Signed)
Medical screening examination/treatment/procedure(s) were performed by non-physician practitioner and as supervising physician I was immediately available for consultation/collaboration.    .Face to face Exam:  General:  A&Ox3 HEENT:  Atraumatic Resp:  Normal effort Abd:  Nondistended Neuro:No focal deficits     Nelia Shi, MD 05/06/11 2214

## 2011-05-06 NOTE — ED Notes (Signed)
Pt states tripped and fell this am, c/o rt wrist pain, rt hip pain radiating through to mid abd area, denies LOC

## 2012-04-09 HISTORY — PX: OTHER SURGICAL HISTORY: SHX169

## 2012-10-03 ENCOUNTER — Encounter: Payer: Self-pay | Admitting: Cardiology

## 2012-10-06 ENCOUNTER — Ambulatory Visit: Payer: Medicare Other | Admitting: Cardiovascular Disease

## 2012-10-23 ENCOUNTER — Encounter: Payer: Self-pay | Admitting: Cardiovascular Disease

## 2012-10-25 ENCOUNTER — Other Ambulatory Visit: Payer: Self-pay | Admitting: *Deleted

## 2012-10-25 DIAGNOSIS — I110 Hypertensive heart disease with heart failure: Secondary | ICD-10-CM

## 2012-11-02 ENCOUNTER — Ambulatory Visit (HOSPITAL_COMMUNITY)
Admission: RE | Admit: 2012-11-02 | Discharge: 2012-11-02 | Disposition: A | Discharge status: Home / Self Care | Payer: Medicare Other | Source: Ambulatory Visit | Attending: Cardiovascular Disease | Admitting: Cardiovascular Disease

## 2012-11-02 DIAGNOSIS — I10 Essential (primary) hypertension: Secondary | ICD-10-CM

## 2012-11-02 DIAGNOSIS — I110 Hypertensive heart disease with heart failure: Secondary | ICD-10-CM | POA: Insufficient documentation

## 2012-11-02 NOTE — Progress Notes (Signed)
Renal Duplex Completed. Brave Dack, RDMS, RVT  

## 2012-11-07 ENCOUNTER — Encounter: Payer: Self-pay | Admitting: Cardiovascular Disease

## 2012-11-07 ENCOUNTER — Ambulatory Visit (INDEPENDENT_AMBULATORY_CARE_PROVIDER_SITE_OTHER): Payer: Medicare Other | Admitting: Cardiovascular Disease

## 2012-11-07 VITALS — BP 140/70 | HR 57 | Ht 61.5 in | Wt 131.0 lb

## 2012-11-07 DIAGNOSIS — I2510 Atherosclerotic heart disease of native coronary artery without angina pectoris: Secondary | ICD-10-CM

## 2012-11-07 DIAGNOSIS — Z951 Presence of aortocoronary bypass graft: Secondary | ICD-10-CM

## 2012-11-07 DIAGNOSIS — E785 Hyperlipidemia, unspecified: Secondary | ICD-10-CM

## 2012-11-07 DIAGNOSIS — I340 Nonrheumatic mitral (valve) insufficiency: Secondary | ICD-10-CM

## 2012-11-07 DIAGNOSIS — R5383 Other fatigue: Secondary | ICD-10-CM

## 2012-11-07 DIAGNOSIS — I701 Atherosclerosis of renal artery: Secondary | ICD-10-CM

## 2012-11-07 DIAGNOSIS — R5381 Other malaise: Secondary | ICD-10-CM

## 2012-11-07 DIAGNOSIS — Z79899 Other long term (current) drug therapy: Secondary | ICD-10-CM

## 2012-11-07 DIAGNOSIS — I10 Essential (primary) hypertension: Secondary | ICD-10-CM

## 2012-11-07 DIAGNOSIS — I059 Rheumatic mitral valve disease, unspecified: Secondary | ICD-10-CM

## 2012-11-07 NOTE — Patient Instructions (Signed)
Your physician recommends that you schedule a follow-up appointment in: 3 months  Your physician recommends that you return for lab work in: [facility].NMR LIPIDS  Your physician has requested that you have a lexiscan myoview. For further information please visit [email or web address] Please follow instruction sheet, as given.

## 2012-11-09 LAB — CBC
HCT: 34.7 % — ABNORMAL LOW (ref 36.0–46.0)
Hemoglobin: 11.8 g/dL — ABNORMAL LOW (ref 12.0–15.0)
MCH: 28.9 pg (ref 26.0–34.0)
MCHC: 34 g/dL (ref 30.0–36.0)
MCV: 85 fL (ref 78.0–100.0)
Platelets: 257 10*3/uL (ref 150–400)
RBC: 4.08 MIL/uL (ref 3.87–5.11)
RDW: 15.4 % (ref 11.5–15.5)
WBC: 7.1 10*3/uL (ref 4.0–10.5)

## 2012-11-09 LAB — COMPREHENSIVE METABOLIC PANEL
ALT: 13 U/L (ref 0–35)
AST: 19 U/L (ref 0–37)
Albumin: 4.2 g/dL (ref 3.5–5.2)
Alkaline Phosphatase: 59 U/L (ref 39–117)
BUN: 22 mg/dL (ref 6–23)
CO2: 26 mEq/L (ref 19–32)
Calcium: 9.2 mg/dL (ref 8.4–10.5)
Chloride: 105 mEq/L (ref 96–112)
Creat: 0.97 mg/dL (ref 0.50–1.10)
Glucose, Bld: 97 mg/dL (ref 70–99)
Potassium: 4.6 mEq/L (ref 3.5–5.3)
Sodium: 139 mEq/L (ref 135–145)
Total Bilirubin: 0.3 mg/dL (ref 0.3–1.2)
Total Protein: 6.6 g/dL (ref 6.0–8.3)

## 2012-11-09 LAB — TSH: TSH: 1.299 u[IU]/mL (ref 0.350–4.500)

## 2012-11-10 LAB — NMR LIPOPROFILE WITH LIPIDS
Cholesterol, Total: 128 mg/dL (ref <200)
HDL Particle Number: 33.8 umol/L (ref >=30.5)
HDL Size: 9.3 nm (ref >=9.2)
HDL-C: 50 mg/dL (ref >=40)
LDL (calc): 61 mg/dL (ref <100)
LDL Particle Number: 856 nmol/L (ref <1000)
LDL Size: 20.4 nm — ABNORMAL LOW (ref >20.5)
LP-IR Score: 26 (ref <=45)
Large HDL-P: 8.7 umol/L (ref >=4.8)
Large VLDL-P: 1.1 nmol/L (ref <=2.7)
Small LDL Particle Number: 468 nmol/L (ref <=527)
Triglycerides: 84 mg/dL (ref <150)
VLDL Size: 43.7 nm (ref <=46.6)

## 2012-11-15 ENCOUNTER — Encounter (HOSPITAL_COMMUNITY): Payer: Self-pay

## 2012-11-15 ENCOUNTER — Ambulatory Visit (HOSPITAL_COMMUNITY)
Admission: RE | Admit: 2012-11-15 | Discharge: 2012-11-15 | Disposition: A | Discharge status: Home / Self Care | Payer: Medicare Other | Source: Ambulatory Visit | Attending: Internal Medicine | Admitting: Internal Medicine

## 2012-11-15 DIAGNOSIS — R42 Dizziness and giddiness: Secondary | ICD-10-CM | POA: Insufficient documentation

## 2012-11-15 DIAGNOSIS — Z951 Presence of aortocoronary bypass graft: Secondary | ICD-10-CM

## 2012-11-15 DIAGNOSIS — I10 Essential (primary) hypertension: Secondary | ICD-10-CM | POA: Insufficient documentation

## 2012-11-15 DIAGNOSIS — Z87891 Personal history of nicotine dependence: Secondary | ICD-10-CM | POA: Insufficient documentation

## 2012-11-15 DIAGNOSIS — R0602 Shortness of breath: Secondary | ICD-10-CM | POA: Insufficient documentation

## 2012-11-15 DIAGNOSIS — Z8249 Family history of ischemic heart disease and other diseases of the circulatory system: Secondary | ICD-10-CM | POA: Insufficient documentation

## 2012-11-15 MED ORDER — REGADENOSON 0.4 MG/5ML IV SOLN
0.4000 mg | Freq: Once | INTRAVENOUS | Status: AC
  Administered 2012-11-15: 0.4 mg via INTRAVENOUS

## 2012-11-15 MED ORDER — TECHNETIUM TC 99M SESTAMIBI GENERIC - CARDIOLITE
30.2000 | Freq: Once | INTRAVENOUS | Status: AC | PRN
  Administered 2012-11-15: 30.2 via INTRAVENOUS

## 2012-11-15 MED ORDER — TECHNETIUM TC 99M SESTAMIBI GENERIC - CARDIOLITE
10.5000 | Freq: Once | INTRAVENOUS | Status: AC | PRN
  Administered 2012-11-15: 11 via INTRAVENOUS

## 2012-11-15 NOTE — Procedures (Addendum)
[facility] [address] [place] [address] [phone]  Cardiology Nuclear Med Study  Tammy [name] is a 79 y.o. female     MRN : [id number]     DOB: 1930-11-24  Procedure Date: 11/15/2012  Nuclear Med Background Indication for Stress Test:  Graft Patency History:  CABG 1994 Cardiac Risk Factors: Family History - CAD, History of Smoking, Hypertension and Lipids  Symptoms:  Dizziness and SOB   Nuclear Pre-Procedure Caffeine/Decaff Intake:  7:30pm NPO After: 5:30am   IV Site: R Antecubital  IV 0.9% NS with Angio Cath:  22g  Chest Size (in):  n/a IV Started by: Koren Shiver, CNMT  Height: 5\' 1"  (1.549 m)  Cup Size: 36D  BMI:  Body mass index is 24.76 kg/(m^2). Weight:  131 lb (59.421 kg)   Tech Comments:  n/a    Nuclear Med Study 1 or 2 day study: 1 day  Stress Test Type:  Lexiscan  Order Authorizing Provider:  Nicki Guadalajara, MD   Resting Radionuclide: Technetium 56m Sestamibi  Resting Radionuclide Dose: 10.5 mCi   Stress Radionuclide:  Technetium 41m Sestamibi  Stress Radionuclide Dose: 30.2 mCi           Stress Protocol Rest HR: 59 Stress HR: 82  Rest BP: 195/74 Stress BP: 162/71  Exercise Time (min): n/a METS: n/a   Predicted Max HR: 140 bpm % Max HR: 58.57 bpm Rate Pressure Product: 21308  Dose of Adenosine (mg):  n/a Dose of Lexiscan: 0.4 mg  Dose of Atropine (mg): n/a Dose of Dobutamine: n/a mcg/kg/min (at max HR)  Stress Test Technologist: Esperanza Sheets, CCT Nuclear Technologist: Gonzella Lex, CNMT   Rest Procedure:  Myocardial perfusion imaging was performed at rest 45 minutes following the intravenous administration of Technetium 25m Sestamibi. Stress Procedure:  The patient received IV dobutamine and no IV atropine.  There were no significant changes with infusion.  Technetium 28m Sestamibi was injected at peak heart rate and quantitative spect images were obtained after a 45 minute  delay.  Transient Ischemic Dilatation (Normal <1.22):  1.01 Lung/Heart Ratio (Normal <0.45):  0.26 QGS EDV:  55 ml QGS ESV:  11 ml LV Ejection Fraction: 80%  Signed by Gonzella Lex, CNMT  PHYSICIAN INTERPRETATION  Rest ECG: NSR with non-specific ST-T wave changes  Stress ECG: No significant change from baseline ECG and No significant ST segment change suggestive of ischemia.  QPS Raw Data Images:  Mild breast attenuation.  Normal left ventricular size. There is interference from nuclear activity from structures below the diaphragm. This does not affect the ability to read the study.  There is significant intense tracer uptake in the subdiaphragmatic Hepatic & Bowel structures. Stress Images:  Normal homogeneous uptake in all areas of the myocardium. Rest Images:  Normal homogeneous uptake in all areas of the myocardium. Subtraction (SDS):  There is no evidence of scar or ischemia.  Impression Exercise Capacity:  Lexiscan with no exercise. BP Response:  Normal blood pressure response. Clinical Symptoms:  There is dyspnea. ECG Impression:  No significant ECG changes with Lexiscan. Comparison with Prior Nuclear Study: No significant change from previous study  Overall Impression:  Normal stress nuclear study. and Low risk stress nuclear study with no scintigraphic evidence of inducible ischemia or infarction.  LV Wall Motion:  NL LV Function; NL Wall Motion - Increased EF is overestimated due to low LV volumes.   Marykay Lex, MD  11/15/2012 5:40 PM

## 2012-11-16 ENCOUNTER — Encounter: Payer: Self-pay | Admitting: Cardiovascular Disease

## 2012-11-16 DIAGNOSIS — E785 Hyperlipidemia, unspecified: Secondary | ICD-10-CM | POA: Insufficient documentation

## 2012-11-16 DIAGNOSIS — I25119 Atherosclerotic heart disease of native coronary artery with unspecified angina pectoris: Secondary | ICD-10-CM | POA: Insufficient documentation

## 2012-11-16 DIAGNOSIS — I10 Essential (primary) hypertension: Secondary | ICD-10-CM | POA: Insufficient documentation

## 2012-11-16 DIAGNOSIS — I340 Nonrheumatic mitral (valve) insufficiency: Secondary | ICD-10-CM | POA: Insufficient documentation

## 2012-11-16 DIAGNOSIS — I701 Atherosclerosis of renal artery: Secondary | ICD-10-CM | POA: Insufficient documentation

## 2012-11-16 NOTE — Progress Notes (Signed)
Patient ID: Tammy [name], female   DOB: January 20, 1931, 79 y.o.   MRN: [id number]     HPI: Tammy [name], is a 79 y.o. female who presents to the office today for eight-month cardiology evaluation. Tammy [name] has established coronary artery disease in 1994 underwent CABG surgery after she was found to have high-grade left main stenosis. Her last nuclear study was in June 2012 showed normal perfusion. An echo Doppler study in March 2003 showed mild-to-moderate mitral regurgitation, moderate tricuspid regurgitation, mild-to-moderate pulmonary hypertension with estimated RV systolic pressure 45 mm, mild to moderate LA dilatation, aortic sclerosis/calcification, and an ejection fraction greater than 55%. She has documented right renal artery stenosis on renal duplex scanning with the last scan in November 2013 suggesting a equal or greater than 60% diameter reduction by velocity in the right renal artery. The kidneys are otherwise normal in size, symmetrical in shape and had normal cortex and medulla. She does have a history of  hypertension and hyperlipidemia. In the past she also has had some issues with cellulitis of her lower extremity. Over the past 6 months, she denies recent chest pain. At times there is a mild fatigue. She is unaware of palpitations.  Past Medical History  Diagnosis Date  . Hypertension   . Psoriasis   . High cholesterol   . Acid reflux   . Hyperlipidemia   . Asthma     Asthmatic COPD  . GERD (gastroesophageal reflux disease)   . Coronary artery disease     Echo 07/27/2011   . Coronary artery stenosis     , High-grade left main    Past Surgical History  Procedure Laterality Date  . Cardiac surgery    . Eye surgery    . Rotator cuff repair      bilateral  . Coronary artery bypass graft  1994    After catheterization showed 90% ostial left main stenosis  . Doppler echocardiography      Study showed mild to moderate MR with moderate TR, mild to moderate pulmonary  hypertension with an ejection fraction greater that 55%.   . Nuclear perfusion  10/2010, 11/15/2012    Showed normal perfusion, No Ischemia or Infarction, Normal EF  . 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 Right proximal 275 and Mid 152.  . Sleep study  12/28/2006    AHI during total sleep time (3h 19 minutes) was 1.81/hr and during REM sleep at 17.78/hr. Mild sleep apnea during REM sleep.Oxygen staturated rate during REM and NREM was 93.0%.    Allergies  Allergen Reactions  . Penicillins Rash    Current Outpatient Prescriptions  Medication Sig Dispense Refill  . amLODipine (NORVASC) 10 MG tablet Take 10 mg by mouth daily.        . beclomethasone (QVAR) 80 MCG/ACT inhaler Inhale 2 puffs into the lungs 2 (two) times daily.        . Chromium 200 MCG CAPS Take 1 capsule by mouth daily. Hold while in hospital       . co-enzyme Q-10 30 MG capsule Take 200 mg by mouth daily. Hold while in hospital       . donepezil (ARICEPT) 10 MG tablet Take 10 mg by mouth every morning.        . etodolac (LODINE) 400 MG tablet Take 400 mg by mouth 2 (two) times daily.        . fish oil-omega-3  fatty acids 1000 MG capsule Take 1 g by mouth daily.        [name] FLUoxetine (PROZAC) 20 MG capsule Take 20 capsules by mouth daily.      . fluticasone (FLONASE) 50 MCG/ACT nasal spray Place 2 sprays into the nose daily.        . hydrochlorothiazide (HYDRODIURIL) 25 MG tablet Take 25 mg by mouth daily.        . metoprolol (LOPRESSOR) 50 MG tablet Take 50 mg by mouth 2 (two) times daily.        . mirtazapine (REMERON) 15 MG tablet Take 15 tablets by mouth at bedtime.      . Multiple Vitamin (MULITIVITAMIN WITH MINERALS) TABS Take 1 tablet by mouth daily.        [name] omeprazole (PRILOSEC) 20 MG capsule Take 20 mg by mouth daily.        . quinapril (ACCUPRIL) 40 MG tablet Take 40 mg by mouth at bedtime.        . ranitidine (ZANTAC) 300 MG tablet Take 300 mg by  mouth at bedtime.        . simvastatin (ZOCOR) 20 MG tablet Take 20 mg by mouth at bedtime.         No current facility-administered medications for this visit.    Socially she denies any recent tobacco use. She remains active.  ROS is negative for fevers, chills or night sweats. He does have issues at times with psoriasis also has issues with intermittent ankle swelling. Is also a history of asthma. She denies chest pressure. She denies palpitations. She denies bleeding she does have right renal artery stenosis. 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 She denies myalgias. Other system review is negative.  PE BP 140/70  Pulse 57  Ht 5' 1.5" (1.562 m)  Wt 131 lb (59.421 kg)  BMI 24.35 kg/m2  General: Alert, oriented, no distress.  Skin: normal turgor, no rashes HEENT: Normocephalic, atraumatic. Pupils round and reactive; sclera anicteric;no lid lag.  Nose without nasal septal hypertrophy Mouth/Parynx benign; Mallinpatti scale 2 Neck: No JVD, no carotid briuts Lungs: clear to ausculatation and percussion; no wheezing or rales Heart: RRR, s1 s2 normal 1/6 systolic murmur. Abdomen: soft, nontender; no hepatosplenomehaly, BS+; abdominal aorta nontender and not dilated by palpation. Pulses 2+ Extremities: Trivial ankle swelling left greater than right. no clubbing cyanosis, Homan's sign negative  Neurologic: grossly nonfocal  ECG  sinus tachycardia with PAC at 57 beats per minute.  LABS:  BMET    Component Value Date/Time   NA 139 11/09/2012 0805   K 4.6 11/09/2012 0805   CL 105 11/09/2012 0805   CO2 26 11/09/2012 0805   GLUCOSE 97 11/09/2012 0805   BUN 22 11/09/2012 0805   CREATININE 0.97 11/09/2012 0805   CREATININE 0.83 07/11/2009 2337   CALCIUM 9.2 11/09/2012 0805   GFRNONAA >60 07/11/2009 2337   GFRAA  Value: >60        The eGFR has been calculated using the MDRD equation. This calculation has not been validated in all  clinical situations. eGFR's persistently <60 mL/min signify possible Chronic Kidney Disease. 07/11/2009 2337     Hepatic Function Panel     Component Value Date/Time   PROT 6.6 11/09/2012 0805   ALBUMIN 4.2 11/09/2012 0805   AST 19 11/09/2012 0805   ALT 13 11/09/2012 0805   ALKPHOS 59 11/09/2012 0805   BILITOT 0.3 11/09/2012 0805  CBC    Component Value Date/Time   WBC 7.1 11/09/2012 0805   RBC 4.08 11/09/2012 0805   HGB 11.8* 11/09/2012 0805   HCT 34.7* 11/09/2012 0805   PLT 257 11/09/2012 0805   MCV 85.0 11/09/2012 0805   MCH 28.9 11/09/2012 0805   MCHC 34.0 11/09/2012 0805   RDW 15.4 11/09/2012 0805   LYMPHSABS 2.2 07/11/2009 2337   MONOABS 1.0 07/11/2009 2337   EOSABS 0.2 07/11/2009 2337   BASOSABS 0.0 07/11/2009 2337     BNP No results found for this basename: probnp    Lipid Panel     Component Value Date/Time   TRIG 84 11/09/2012 0805   LDLCALC 61 11/09/2012 0805     RADIOLOGY: No results found.    ASSESSMENT AND PLAN: This pain is now 79 years old. She is 20 years status post CBG revascularization surgery. Last nuclear perfusion study was over 2 years ago. I am checking laboratory consisting of CBC, CMet,  NMR lipoprofile and TSH level. I am scheduling her for a two-year followup nuclear perfusion study, 20 years after her bypass surgery to assess continued graft patency, scar/ischemia adjustments will be made based on her blood work results if necessary. I will see her in 3 months for followup evaluation.     Lennette Bihari, MD, [facility]  11/16/2012 2:13 PM

## 2012-11-28 ENCOUNTER — Encounter: Payer: Self-pay | Admitting: *Deleted

## 2012-11-28 NOTE — Progress Notes (Signed)
Letter sent to patient.

## 2012-11-29 ENCOUNTER — Telehealth: Payer: Self-pay | Admitting: Cardiovascular Disease

## 2012-11-29 NOTE — Telephone Encounter (Signed)
Wants lab results from 3 or 4 wks ago please! Also wants to talk to you about her medicine!

## 2012-12-01 ENCOUNTER — Telehealth: Payer: Self-pay | Admitting: *Deleted

## 2012-12-01 NOTE — Telephone Encounter (Signed)
Please review and advise.

## 2012-12-01 NOTE — Telephone Encounter (Signed)
Routed note to Dr. Tresa Endo.

## 2012-12-01 NOTE — Telephone Encounter (Signed)
Informed patient Dr. Tresa Endo wants he to restart her crestor. A. New prescription will be sent to her pharmacy.

## 2012-12-05 ENCOUNTER — Encounter: Payer: Self-pay | Admitting: *Deleted

## 2013-01-09 ENCOUNTER — Other Ambulatory Visit: Payer: Self-pay | Admitting: Allergy and Immunology

## 2013-01-09 DIAGNOSIS — M545 Low back pain, unspecified: Secondary | ICD-10-CM

## 2013-01-09 DIAGNOSIS — IMO0002 Reserved for concepts with insufficient information to code with codable children: Secondary | ICD-10-CM

## 2013-01-18 ENCOUNTER — Ambulatory Visit
Admission: RE | Admit: 2013-01-18 | Discharge: 2013-01-18 | Disposition: A | Discharge status: Home / Self Care | Payer: Medicare Other | Source: Ambulatory Visit | Attending: Allergy and Immunology | Admitting: Allergy and Immunology

## 2013-01-18 DIAGNOSIS — M545 Low back pain, unspecified: Secondary | ICD-10-CM

## 2013-01-18 DIAGNOSIS — IMO0002 Reserved for concepts with insufficient information to code with codable children: Secondary | ICD-10-CM

## 2013-01-18 MED ORDER — GADOBENATE DIMEGLUMINE 529 MG/ML IV SOLN
10.0000 mL | Freq: Once | INTRAVENOUS | Status: AC | PRN
  Administered 2013-01-18: 10 mL via INTRAVENOUS

## 2013-01-22 ENCOUNTER — Other Ambulatory Visit: Payer: Self-pay | Admitting: Gastroenterology

## 2013-01-22 DIAGNOSIS — R1032 Left lower quadrant pain: Secondary | ICD-10-CM

## 2013-01-24 ENCOUNTER — Ambulatory Visit
Admission: RE | Admit: 2013-01-24 | Discharge: 2013-01-24 | Disposition: A | Discharge status: Home / Self Care | Payer: Medicare Other | Source: Ambulatory Visit | Attending: Gastroenterology | Admitting: Gastroenterology

## 2013-01-24 ENCOUNTER — Other Ambulatory Visit: Payer: Medicare Other

## 2013-01-24 DIAGNOSIS — R1032 Left lower quadrant pain: Secondary | ICD-10-CM

## 2013-01-24 MED ORDER — IOHEXOL 300 MG/ML  SOLN
100.0000 mL | Freq: Once | INTRAMUSCULAR | Status: AC | PRN
  Administered 2013-01-24: 100 mL via INTRAVENOUS

## 2013-02-08 ENCOUNTER — Encounter: Payer: Self-pay | Admitting: Cardiovascular Disease

## 2013-02-08 ENCOUNTER — Ambulatory Visit (INDEPENDENT_AMBULATORY_CARE_PROVIDER_SITE_OTHER): Payer: Medicare Other | Admitting: Cardiovascular Disease

## 2013-02-08 VITALS — BP 140/80 | HR 68 | Ht 61.0 in | Wt 129.5 lb

## 2013-02-08 DIAGNOSIS — I10 Essential (primary) hypertension: Secondary | ICD-10-CM

## 2013-02-08 DIAGNOSIS — I701 Atherosclerosis of renal artery: Secondary | ICD-10-CM

## 2013-02-08 DIAGNOSIS — I2510 Atherosclerotic heart disease of native coronary artery without angina pectoris: Secondary | ICD-10-CM

## 2013-02-08 DIAGNOSIS — I340 Nonrheumatic mitral (valve) insufficiency: Secondary | ICD-10-CM

## 2013-02-08 DIAGNOSIS — E785 Hyperlipidemia, unspecified: Secondary | ICD-10-CM

## 2013-02-08 DIAGNOSIS — I059 Rheumatic mitral valve disease, unspecified: Secondary | ICD-10-CM

## 2013-02-08 NOTE — Patient Instructions (Addendum)
Your physician has recommended you make the following change in your medication: cut the simvastatin into half. Increase the coQ 10 to 300 mg.  Your physician recommends that you schedule a follow-up appointment in: 6 months.

## 2013-02-10 ENCOUNTER — Encounter: Payer: Self-pay | Admitting: Cardiovascular Disease

## 2013-02-10 NOTE — Progress Notes (Signed)
Patient ID: Tammy [name], female   DOB: 1931-03-20, 79 y.o.   MRN: [id number]     HPI: Tammy [name], is a 79 y.o. female who presents to the office today for followup cardiology evaluation. I last saw her 3 months ago. Tammy [name] has established coronary artery disease in 1994 underwent CABG surgery after she was found to have high-grade left main stenosis. A nuclear perfusion study in June 2012 showed normal perfusion. An echo Doppler study in March 2013 showed mild-to-moderate mitral regurgitation, moderate tricuspid regurgitation, mild-to-moderate pulmonary hypertension with estimated RV systolic pressure 45 mm, mild to moderate LA dilatation, aortic sclerosis/calcification, and an ejection fraction greater than 55%. She has documented right renal artery stenosis on renal duplex scanning with the last scan in November 2013 suggesting a equal or greater than 60% diameter reduction by velocity in the right renal artery. The kidneys are otherwise normal in size, symmetrical in shape and had normal cortex and medulla. She does have a history of  hypertension and hyperlipidemia. In the past she also has had some issues with cellulitis of her lower extremity.   Tammy [name] recently underwent a two-year followup nuclear perfusion study which was done on 11/16/2011 no significant EKG changes were demonstrated. The study was low risk and did not show any area of scar or infarction. This is unchanged from 2 years previously.  His brain denies chest pain. She states she may need low back surgery. Recently, she started to notice some leg soreness. She denies arthralgias or definitive muscle tenderness.  Past Medical History  Diagnosis Date  . Hypertension   . Psoriasis   . High cholesterol   . Acid reflux   . Hyperlipidemia   . Asthma     Asthmatic COPD  . GERD (gastroesophageal reflux disease)   . Coronary artery disease     Echo 07/27/2011   . Coronary artery stenosis     , High-grade left main     Past Surgical History  Procedure Laterality Date  . Cardiac surgery    . Eye surgery    . Rotator cuff repair      bilateral  . Coronary artery bypass graft  1994    After catheterization showed 90% ostial left main stenosis  . Doppler echocardiography      Study showed mild to moderate MR with moderate TR, mild to moderate pulmonary hypertension with an ejection fraction greater that 55%.   . Nuclear perfusion  10/2010, 11/15/2012    Showed normal perfusion, No Ischemia or Infarction, Normal EF  . 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 Right proximal 275 and Mid 152.  . Sleep study  12/28/2006    AHI during total sleep time (3h 19 minutes) was 1.81/hr and during REM sleep at 17.78/hr. Mild sleep apnea during REM sleep.Oxygen staturated rate during REM and NREM was 93.0%.    Allergies  Allergen Reactions  . Penicillins Rash    Current Outpatient Prescriptions  Medication Sig Dispense Refill  . amLODipine (NORVASC) 10 MG tablet Take 10 mg by mouth daily.        . beclomethasone (QVAR) 80 MCG/ACT inhaler Inhale 2 puffs into the lungs 2 (two) times daily.        [name] co-enzyme Q-10 30 MG capsule Take 200 mg by mouth daily. Hold while in hospital       . donepezil (ARICEPT) 10 MG tablet Take 10  mg by mouth every morning.        . etodolac (LODINE) 400 MG tablet Take 400 mg by mouth 2 (two) times daily.        . fish oil-omega-3 fatty acids 1000 MG capsule Take 1 g by mouth daily. 2 caps in the AM 3 caps in the PM      . FLUoxetine (PROZAC) 20 MG capsule Take 20 capsules by mouth daily.      . fluticasone (FLONASE) 50 MCG/ACT nasal spray Place 2 sprays into the nose daily.        . hydrochlorothiazide (HYDRODIURIL) 25 MG tablet Take 25 mg by mouth daily.        . hydrOXYzine (ATARAX/VISTARIL) 10 MG tablet Take 10 mg by mouth as needed for itching.      . metoprolol (LOPRESSOR) 100 MG tablet Take 100 mg by mouth 2  (two) times daily.      . mirtazapine (REMERON) 15 MG tablet Take 15 tablets by mouth at bedtime.      . Multiple Vitamin (MULITIVITAMIN WITH MINERALS) TABS Take 1 tablet by mouth daily.        [name] omeprazole (PRILOSEC) 20 MG capsule Take 20 mg by mouth daily.        . quinapril (ACCUPRIL) 40 MG tablet Take 40 mg by mouth at bedtime.        . ranitidine (ZANTAC) 300 MG tablet Take 300 mg by mouth at bedtime.        . simvastatin (ZOCOR) 20 MG tablet Take 20 mg by mouth at bedtime.        . TURMERIC PO Take by mouth 2 (two) times daily.      . VENTOLIN HFA 108 (90 BASE) MCG/ACT inhaler        No current facility-administered medications for this visit.    Socially she denies any recent tobacco use. She remains active.  ROS is negative for fevers, chills or night sweats. He does have issues at times with psoriasis also has issues with intermittent ankle swelling. Is also a history of asthma. She denies chest pressure. She denies palpitations. She denies bleeding she does have right renal artery stenosis. 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 She has noted some vague leg discomfort at night. She denies restless legs.. Other system review is negative.  PE BP 140/80  Pulse 68  Ht 5\' 1"  (1.549 m)  Wt 129 lb 8 oz (58.741 kg)  BMI 24.48 kg/m2  General: Alert, oriented, no distress.  Skin: normal turgor, no rashes HEENT: Normocephalic, atraumatic. Pupils round and reactive; sclera anicteric;no lid lag.  Nose without nasal septal hypertrophy Mouth/Parynx benign; Mallinpatti scale 2 Neck: No JVD, no carotid briuts Lungs: clear to ausculatation and percussion; no wheezing or rales Heart: RRR, s1 s2 normal 1/6 systolic murmur. Abdomen: soft, nontender; no hepatosplenomehaly, BS+; abdominal aorta nontender and not dilated by palpation. Pulses 2+ Extremities: Trivial ankle swelling left greater than right. no clubbing cyanosis, Homan's  sign negative  Neurologic: grossly nonfocal  ECG: Normal sinus rhythm at 68 beats per minute. One isolated PVC. Nonspecific ST changes.  LABS:  BMET    Component Value Date/Time   NA 139 11/09/2012 0805   K 4.6 11/09/2012 0805   CL 105 11/09/2012 0805   CO2 26 11/09/2012 0805   GLUCOSE 97 11/09/2012 0805   BUN 22 11/09/2012 0805   CREATININE 0.97 11/09/2012 0805   CREATININE 0.83  07/11/2009 2337   CALCIUM 9.2 11/09/2012 0805   GFRNONAA >60 07/11/2009 2337   GFRAA  Value: >60        The eGFR has been calculated using the MDRD equation. This calculation has not been validated in all clinical situations. eGFR's persistently <60 mL/min signify possible Chronic Kidney Disease. 07/11/2009 2337     Hepatic Function Panel     Component Value Date/Time   PROT 6.6 11/09/2012 0805   ALBUMIN 4.2 11/09/2012 0805   AST 19 11/09/2012 0805   ALT 13 11/09/2012 0805   ALKPHOS 59 11/09/2012 0805   BILITOT 0.3 11/09/2012 0805     CBC    Component Value Date/Time   WBC 7.1 11/09/2012 0805   RBC 4.08 11/09/2012 0805   HGB 11.8* 11/09/2012 0805   HCT 34.7* 11/09/2012 0805   PLT 257 11/09/2012 0805   MCV 85.0 11/09/2012 0805   MCH 28.9 11/09/2012 0805   MCHC 34.0 11/09/2012 0805   RDW 15.4 11/09/2012 0805   LYMPHSABS 2.2 07/11/2009 2337   MONOABS 1.0 07/11/2009 2337   EOSABS 0.2 07/11/2009 2337   BASOSABS 0.0 07/11/2009 2337     BNP No results found for this basename: probnp    Lipid Panel     Component Value Date/Time   TRIG 84 11/09/2012 0805   LDLCALC 61 11/09/2012 0805     RADIOLOGY: No results found.    ASSESSMENT AND PLAN: Ms. Gravette is now 20 years status post her CABG revascularization surgery which was done in 1994. Her most recent nuclear perfusion study is unchanged from 2 years previously and continues to show fairly normal perfusion without scar or ischemia. Several months ago, she did have an aunt or lipoprotein which was excellent and showed an LDL particle #856 and calculated LDL at  61. Her triglycerides were 84, HDL 50, and total cholesterol 128. I'm recommending that she try reducing her simvastatin from 20 mg to 10 mg and increase her Cardizem Q10 supplementation to 300 mg daily. If she does require back surgery I will give her operative clearance. I will see her in 6 months for followup evaluation.    Lennette Bihari, MD, [facility]  02/10/2013 7:26 PM

## 2013-02-12 ENCOUNTER — Other Ambulatory Visit: Payer: Self-pay | Admitting: *Deleted

## 2013-02-12 ENCOUNTER — Telehealth: Payer: Self-pay | Admitting: Cardiovascular Disease

## 2013-02-12 MED ORDER — QUINAPRIL HCL 40 MG PO TABS: 40.0000 mg | ORAL_TABLET | Freq: Every day | ORAL | Status: DC

## 2013-02-12 MED ORDER — AMLODIPINE BESYLATE 10 MG PO TABS: 10.0000 mg | ORAL_TABLET | Freq: Every day | ORAL | Status: DC

## 2013-02-12 NOTE — Telephone Encounter (Signed)
Returned call.  Pt stated refills went to [facility] instead of [facility].  Informed refills will be resent to mail order pharmacy.  Pt verbalized understanding and agreed w/ plan.  Refill(s) sent to pharmacy.

## 2013-02-12 NOTE — Telephone Encounter (Signed)
Her medication refills went to [facility] instead of her mail order pharmacy.  [phone] ([facility])

## 2013-03-30 ENCOUNTER — Other Ambulatory Visit: Payer: Self-pay | Admitting: *Deleted

## 2013-03-30 MED ORDER — METOPROLOL TARTRATE 100 MG PO TABS: 100.0000 mg | ORAL_TABLET | Freq: Two times a day (BID) | ORAL | Status: DC

## 2013-03-30 NOTE — Telephone Encounter (Signed)
Rx was sent to pharmacy electronically. 

## 2013-04-06 ENCOUNTER — Telehealth (HOSPITAL_COMMUNITY): Payer: Self-pay | Admitting: *Deleted

## 2013-04-14 ENCOUNTER — Other Ambulatory Visit: Payer: Self-pay

## 2013-04-14 MED ORDER — DONEPEZIL HCL 10 MG PO TABS: 10.0000 mg | ORAL_TABLET | Freq: Every day | ORAL | Status: DC

## 2013-04-20 ENCOUNTER — Telehealth (HOSPITAL_COMMUNITY): Payer: Self-pay | Admitting: *Deleted

## 2013-08-22 ENCOUNTER — Other Ambulatory Visit: Payer: Self-pay | Admitting: *Deleted

## 2013-08-22 MED ORDER — HYDROCHLOROTHIAZIDE 25 MG PO TABS: 25.0000 mg | ORAL_TABLET | Freq: Every day | ORAL | Status: DC

## 2013-08-22 NOTE — Telephone Encounter (Signed)
Rx refill sent to patient pharmacy   

## 2013-11-02 ENCOUNTER — Ambulatory Visit (INDEPENDENT_AMBULATORY_CARE_PROVIDER_SITE_OTHER): Payer: Medicare Other | Admitting: Cardiovascular Disease

## 2013-11-02 ENCOUNTER — Encounter (HOSPITAL_COMMUNITY): Payer: Self-pay | Admitting: *Deleted

## 2013-11-02 ENCOUNTER — Encounter: Payer: Self-pay | Admitting: Cardiovascular Disease

## 2013-11-02 VITALS — BP 130/52 | HR 55 | Ht 61.0 in | Wt 136.3 lb

## 2013-11-02 DIAGNOSIS — R5381 Other malaise: Secondary | ICD-10-CM

## 2013-11-02 DIAGNOSIS — R5383 Other fatigue: Secondary | ICD-10-CM

## 2013-11-02 DIAGNOSIS — I2510 Atherosclerotic heart disease of native coronary artery without angina pectoris: Secondary | ICD-10-CM

## 2013-11-02 DIAGNOSIS — R0602 Shortness of breath: Secondary | ICD-10-CM

## 2013-11-02 DIAGNOSIS — I272 Pulmonary hypertension, unspecified: Secondary | ICD-10-CM | POA: Insufficient documentation

## 2013-11-02 DIAGNOSIS — R531 Weakness: Secondary | ICD-10-CM | POA: Insufficient documentation

## 2013-11-02 DIAGNOSIS — I10 Essential (primary) hypertension: Secondary | ICD-10-CM

## 2013-11-02 DIAGNOSIS — R5382 Chronic fatigue, unspecified: Secondary | ICD-10-CM

## 2013-11-02 DIAGNOSIS — I059 Rheumatic mitral valve disease, unspecified: Secondary | ICD-10-CM

## 2013-11-02 DIAGNOSIS — E785 Hyperlipidemia, unspecified: Secondary | ICD-10-CM

## 2013-11-02 DIAGNOSIS — I340 Nonrheumatic mitral (valve) insufficiency: Secondary | ICD-10-CM

## 2013-11-02 DIAGNOSIS — I701 Atherosclerosis of renal artery: Secondary | ICD-10-CM

## 2013-11-02 DIAGNOSIS — I2789 Other specified pulmonary heart diseases: Secondary | ICD-10-CM

## 2013-11-02 LAB — COMPREHENSIVE METABOLIC PANEL
ALT: 15 U/L (ref 0–35)
AST: 18 U/L (ref 0–37)
Albumin: 4.1 g/dL (ref 3.5–5.2)
Alkaline Phosphatase: 66 U/L (ref 39–117)
BUN: 16 mg/dL (ref 6–23)
CO2: 28 mEq/L (ref 19–32)
Calcium: 9.8 mg/dL (ref 8.4–10.5)
Chloride: 91 mEq/L — ABNORMAL LOW (ref 96–112)
Creat: 1.21 mg/dL — ABNORMAL HIGH (ref 0.50–1.10)
Glucose, Bld: 116 mg/dL — ABNORMAL HIGH (ref 70–99)
Potassium: 3.7 mEq/L (ref 3.5–5.3)
Sodium: 130 mEq/L — ABNORMAL LOW (ref 135–145)
Total Bilirubin: 0.6 mg/dL (ref 0.2–1.2)
Total Protein: 7 g/dL (ref 6.0–8.3)

## 2013-11-02 LAB — TSH: TSH: 0.917 u[IU]/mL (ref 0.350–4.500)

## 2013-11-02 LAB — CBC
HCT: 37.8 % (ref 36.0–46.0)
Hemoglobin: 13 g/dL (ref 12.0–15.0)
MCH: 29.1 pg (ref 26.0–34.0)
MCHC: 34.4 g/dL (ref 30.0–36.0)
MCV: 84.8 fL (ref 78.0–100.0)
Platelets: 373 10*3/uL (ref 150–400)
RBC: 4.46 MIL/uL (ref 3.87–5.11)
RDW: 14.1 % (ref 11.5–15.5)
WBC: 8.3 10*3/uL (ref 4.0–10.5)

## 2013-11-02 LAB — LIPID PANEL
Cholesterol: 176 mg/dL (ref 0–200)
HDL: 64 mg/dL (ref >39)
LDL Cholesterol: 82 mg/dL (ref 0–99)
Total CHOL/HDL Ratio: 2.8 Ratio
Triglycerides: 151 mg/dL — ABNORMAL HIGH (ref <150)
VLDL: 30 mg/dL (ref 0–40)

## 2013-11-02 NOTE — Patient Instructions (Addendum)
Your physician has recommended you make the following change in your medication: CUT THE AMLODIPINE INTO HALF. TAKE 1/2/ TABLET DAILY. ( 5 MG)  Your physician has requested that you have a renal artery duplex. During this test, an ultrasound is used to evaluate blood flow to the kidneys. Allow one hour for this exam. Do not eat after midnight the day before and avoid carbonated beverages. Take your medications as you usually do.   Your physician has requested that you have an echocardiogram. Echocardiography is a painless test that uses sound waves to create images of your heart. It provides your doctor with information about the size and shape of your heart and how well your heart's chambers and valves are working. This procedure takes approximately one hour. There are no restrictions for this procedure.  Your physician recommends that you return for lab work fasting.  Your physician recommends that you schedule a follow-up appointment in: 2 months.

## 2013-11-02 NOTE — Progress Notes (Signed)
Patient ID: Tammy [name], female   DOB: 02-27-1931, 80 y.o.   MRN: [id number]     HPI: Tammy [name] is a 80 y.o. female who presents to the office today for a 9 month followup cardiology evaluation.   Tammy [name] has established coronary artery disease andunderwent CABG surgery in 1994 after she was found to have high-grade left main stenosis. A nuclear perfusion study in June 2011 showed normal perfusion. An echo Doppler study in March 2013 showed mild-to-moderate mitral regurgitation, moderate tricuspid regurgitation, mild-to-moderate pulmonary hypertension with estimated RV systolic pressure 45 mm, mild to moderate LA dilatation, aortic sclerosis/calcification, and an ejection fraction greater than 55%. She has documented right renal artery stenosis on renal duplex scanning with the last scan in November 2013 suggesting a equal or greater than 60% diameter reduction by velocity in the right renal artery. The kidneys are otherwise normal in size, symmetrical in shape and had normal cortex and medulla. She does have a history of  hypertension and hyperlipidemia. In the past she also has had some issues with cellulitis of her lower extremity.   Tammy [name] underwent a two-year followup nuclear perfusion study on 11/16/2011; no significant EKG changes were demonstrated. The study was low risk and did not show any area of scar or infarction. This is unchanged from 2 years previously.  Since I last saw her, she has had difficulty with diverticulitis.  She notes significant fatigue.  She admits to significant shortness of breath, particularly with activity.  She denies associated chest pressure.  She admits to leg swelling, left greater than right.  She presents now for followup evaluation.  Past Medical History  Diagnosis Date  . Hypertension   . Psoriasis   . High cholesterol   . Acid reflux   . Hyperlipidemia   . Asthma     Asthmatic COPD  . GERD (gastroesophageal reflux disease)   . Coronary  artery disease     Echo 07/27/2011   . Coronary artery stenosis     , High-grade left main    Past Surgical History  Procedure Laterality Date  . Cardiac surgery    . Eye surgery    . Rotator cuff repair      bilateral  . Coronary artery bypass graft  1994    After catheterization showed 90% ostial left main stenosis  . Doppler echocardiography      Study showed mild to moderate MR with moderate TR, mild to moderate pulmonary hypertension with an ejection fraction greater that 55%.   . Nuclear perfusion  10/2010, 11/15/2012    Showed normal perfusion, No Ischemia or Infarction, Normal EF  . 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 Right proximal 275 and Mid 152.  . Sleep study  12/28/2006    AHI during total sleep time (3h 19 minutes) was 1.81/hr and during REM sleep at 17.78/hr. Mild sleep apnea during REM sleep.Oxygen staturated rate during REM and NREM was 93.0%.    Allergies  Allergen Reactions  . Penicillins Rash    Current Outpatient Prescriptions  Medication Sig Dispense Refill  . amLODipine (NORVASC) 10 MG tablet Take 1 tablet (10 mg total) by mouth daily.  90 tablet  3  . beclomethasone (QVAR) 80 MCG/ACT inhaler Inhale 2 puffs into the lungs 2 (two) times daily.        [name] co-enzyme Q-10 30 MG capsule Take 200 mg by mouth daily.  Hold while in hospital       . donepezil (ARICEPT) 10 MG tablet Take 1 tablet (10 mg total) by mouth daily.  90 tablet  0  . etodolac (LODINE) 400 MG tablet Take 400 mg by mouth 2 (two) times daily.        . fish oil-omega-3 fatty acids 1000 MG capsule Take 1 g by mouth daily. 2 caps in the AM 3 caps in the PM      . FLUoxetine (PROZAC) 20 MG capsule Take 20 capsules by mouth daily.      . fluticasone (FLONASE) 50 MCG/ACT nasal spray Place 2 sprays into the nose daily.        . hydrochlorothiazide (HYDRODIURIL) 25 MG tablet Take 1 tablet (25 mg total) by mouth daily.  90 tablet  0    . hydrOXYzine (ATARAX/VISTARIL) 10 MG tablet Take 10 mg by mouth as needed for itching.       . metoprolol (LOPRESSOR) 100 MG tablet Take 1 tablet (100 mg total) by mouth 2 (two) times daily.  180 tablet  3  . mirtazapine (REMERON) 15 MG tablet Take 15 tablets by mouth at bedtime.      . Multiple Vitamin (MULITIVITAMIN WITH MINERALS) TABS Take 1 tablet by mouth daily.        [name] omeprazole (PRILOSEC) 20 MG capsule Take 20 mg by mouth daily.        . quinapril (ACCUPRIL) 40 MG tablet Take 1 tablet (40 mg total) by mouth at bedtime.  90 tablet  3  . ranitidine (ZANTAC) 300 MG tablet Take 300 mg by mouth at bedtime.        . simvastatin (ZOCOR) 20 MG tablet Take 20 mg by mouth at bedtime.        . TURMERIC PO Take by mouth 2 (two) times daily.      . VENTOLIN HFA 108 (90 BASE) MCG/ACT inhaler        No current facility-administered medications for this visit.    Socially , she is divorced.  She denies any recent tobacco use. She has not been able to exercise regularly.  ROS General: Negative; No fevers, chills, or night sweats; was in for fatigue. HEENT: Negative; No changes in vision or hearing, sinus congestion, difficulty swallowing Pulmonary: Negative; No cough, wheezing, shortness of breath, hemoptysis Cardiovascular: See history of present illness; positive for exertional dyspnea No chest pain, presyncope, syncope, palpitations; positive for leg GI: Positive for diverticulitis, and frequent diarrhea;  GU: Positive for renal artery stenosis; No dysuria, hematuria, or difficulty voiding Musculoskeletal: Negative; no myalgias, joint pain, or weakness Hematologic/Oncology: Negative; no easy bruising, bleeding Endocrine: Negative; no heat/cold intolerance; no diabetes Neuro: Negative; no changes in balance, headaches Skin: Negative; No rashes or skin lesions Psychiatric: Negative; No behavioral problems, depression Sleep: Negative; No snoring, daytime sleepiness, hypersomnolence, bruxism,  restless legs, hypnogognic hallucinations, no cataplexy Other comprehensive 14 point system review is negative.  ROS is negative for fevers, chills or night sweats. He does have issues at times with psoriasis also has issues with intermittent ankle swelling. Is also a history of asthma. She denies chest pressure. She denies palpitations. She denies bleeding she does have right renal artery stenosis. 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 She has noted some vague leg discomfort at night. She denies restless legs.. Other system review is negative.  PE BP 130/52  Pulse 55  Ht _0  (1.549 m)  Wt 136 lb 4.8 oz (61.825 kg)  BMI 25.77 kg/m2  General: Alert, oriented, no distress.  Skin: normal turgor, no rashes HEENT: Normocephalic, atraumatic. Pupils round and reactive; sclera anicteric;no lid lag.  Nose without nasal septal hypertrophy Mouth/Parynx benign; Mallinpatti scale 2 Neck: No JVD, no carotid bruits with normal carotid Chest wall: Nontender to palpation Lungs: clear to ausculatation and percussion; no wheezing or rales Heart: RRR, s1 s2 normal 1/6 systolic murmur.  No S3 or S4 gallop.  No diastolic murmur, rubs, thrills or heaves. Abdomen: soft, nontender; no hepatosplenomehaly, BS+; abdominal aorta nontender and not dilated by palpation. Back: No CVA tenderness Pulses 2+ Extremities: 1+ ankle swelling left greater than right. no clubbing cyanosis, Homan's sign negative  Neurologic: grossly nonfocal  ECG (independently read by me): Sinus bradycardia 55 beats per minute.  Nonspecific ST-T changes.  QTc interval 430 ms.  Borderline first degree AV block with a PR interval of 202 ms.  Prior September 2014 ECG: Normal sinus rhythm at 68 beats per minute. One isolated PVC. Nonspecific ST changes.  LABS:  BMET    Component Value Date/Time   NA 139 11/09/2012 0805   K 4.6 11/09/2012 0805   CL 105 11/09/2012 0805   CO2 26  11/09/2012 0805   GLUCOSE 97 11/09/2012 0805   BUN 22 11/09/2012 0805   CREATININE 0.97 11/09/2012 0805   CREATININE 0.83 07/11/2009 2337   CALCIUM 9.2 11/09/2012 0805   GFRNONAA >60 07/11/2009 2337   GFRAA  Value: >60        The eGFR has been calculated using the MDRD equation. This calculation has not been validated in all clinical situations. eGFR's persistently <60 mL/min signify possible Chronic Kidney Disease. 07/11/2009 2337     Hepatic Function Panel     Component Value Date/Time   PROT 6.6 11/09/2012 0805   ALBUMIN 4.2 11/09/2012 0805   AST 19 11/09/2012 0805   ALT 13 11/09/2012 0805   ALKPHOS 59 11/09/2012 0805   BILITOT 0.3 11/09/2012 0805     CBC    Component Value Date/Time   WBC 7.1 11/09/2012 0805   RBC 4.08 11/09/2012 0805   HGB 11.8* 11/09/2012 0805   HCT 34.7* 11/09/2012 0805   PLT 257 11/09/2012 0805   MCV 85.0 11/09/2012 0805   MCH 28.9 11/09/2012 0805   MCHC 34.0 11/09/2012 0805   RDW 15.4 11/09/2012 0805   LYMPHSABS 2.2 07/11/2009 2337   MONOABS 1.0 07/11/2009 2337   EOSABS 0.2 07/11/2009 2337   BASOSABS 0.0 07/11/2009 2337     BNP No results found for this basename: probnp    Lipid Panel     Component Value Date/Time   TRIG 84 11/09/2012 0805   LDLCALC 61 11/09/2012 0805     RADIOLOGY: No results found.    ASSESSMENT AND PLAN: Tammy [name] is 21 years status post her CABG revascularization surgery.  Her nuclear perfusion study in 2013 continued to show fairly normal perfusion without scar or ischemia.  Recently, she has had issues with significant fatigue.  She also notes shortness of breath.  There is leg swelling.  She has a history of hypertension and currently is treated with Accupril 40 mg, Lopressor 100 mg twice a day, HCTZ, 25 mg, in addition to amlodipine, 10 mg.  I am recommending she reduce her amlodipine to 5 mg to see if this can improve her peripheral edema.  I recommended complete discontinuance of Lodine, which he has only rarely been taking.  She  does have a history of documented pulmonary hypertension on her last echo several years ago.  She did have renal artery stenosis documented on duplex imaging and did not return.  Last year for study.  I'm scheduling her for followup renal duplex study to reevaluate her renal artery stenosis.  In light of her dyspnea, she is also will have an 2-D echo Doppler study.  A complete set of blood work will be obtained.  I will see her back in the office in 2 months for reevaluation.   [name], MD, [facility]  11/02/2013 9:24 AM

## 2013-11-06 ENCOUNTER — Other Ambulatory Visit: Payer: Self-pay | Admitting: *Deleted

## 2013-11-06 MED ORDER — HYDROCHLOROTHIAZIDE 25 MG PO TABS: 25.0000 mg | ORAL_TABLET | Freq: Every day | ORAL | Status: DC

## 2013-11-06 MED ORDER — SIMVASTATIN 20 MG PO TABS
20.0000 mg | ORAL_TABLET | Freq: Every day | ORAL | Status: DC
Start: 2013-11-06 — End: 2014-11-29

## 2013-11-06 NOTE — Telephone Encounter (Signed)
Rx was sent to pharmacy electronically. 

## 2013-11-12 ENCOUNTER — Ambulatory Visit (HOSPITAL_COMMUNITY)
Admission: RE | Admit: 2013-11-12 | Discharge: 2013-11-12 | Disposition: A | Discharge status: Home / Self Care | Payer: Medicare Other | Source: Ambulatory Visit | Attending: Cardiology | Admitting: Cardiology

## 2013-11-12 ENCOUNTER — Ambulatory Visit (HOSPITAL_BASED_OUTPATIENT_CLINIC_OR_DEPARTMENT_OTHER)
Admission: RE | Admit: 2013-11-12 | Discharge: 2013-11-12 | Disposition: A | Discharge status: Home / Self Care | Payer: Medicare Other | Source: Ambulatory Visit | Attending: Cardiovascular Disease | Admitting: Cardiovascular Disease

## 2013-11-12 ENCOUNTER — Encounter: Payer: Self-pay | Admitting: *Deleted

## 2013-11-12 DIAGNOSIS — R0602 Shortness of breath: Secondary | ICD-10-CM

## 2013-11-12 DIAGNOSIS — I701 Atherosclerosis of renal artery: Secondary | ICD-10-CM | POA: Insufficient documentation

## 2013-11-12 DIAGNOSIS — I517 Cardiomegaly: Secondary | ICD-10-CM

## 2013-11-12 DIAGNOSIS — I10 Essential (primary) hypertension: Secondary | ICD-10-CM

## 2013-11-12 NOTE — Progress Notes (Signed)
2D Echo Performed 11/12/2013    Tammy [name], RCS

## 2013-11-12 NOTE — Progress Notes (Signed)
Renal Duplex Completed. Caran Storck, BS, RDMS, RVT  

## 2013-11-13 ENCOUNTER — Encounter: Payer: Self-pay | Admitting: Nurse Practitioner

## 2013-11-13 ENCOUNTER — Ambulatory Visit (INDEPENDENT_AMBULATORY_CARE_PROVIDER_SITE_OTHER): Payer: Medicare Other | Admitting: Nurse Practitioner

## 2013-11-13 VITALS — BP 129/55 | HR 66 | Ht 60.0 in | Wt 137.0 lb

## 2013-11-13 DIAGNOSIS — R413 Other amnesia: Secondary | ICD-10-CM

## 2013-11-13 DIAGNOSIS — R259 Unspecified abnormal involuntary movements: Secondary | ICD-10-CM | POA: Insufficient documentation

## 2013-11-13 MED ORDER — DONEPEZIL HCL 10 MG PO TABS: 10.0000 mg | ORAL_TABLET | Freq: Every day | ORAL | Status: DC

## 2013-11-13 NOTE — Progress Notes (Signed)
[facility]  PATIENT: Tammy [name] DOB: March 14, 1931   REASON FOR VISIT: Followup for memory   HISTORY OF PRESENT ILLNESS:Mrs. Tammy [name], a 80 year old female presents to the office with a complaint of memory loss, she also has a tremor which is long-standing and started when she was 80 years old. She was last seen 03/22/2012.  She is alone at todays visit.  The patient states, that her memory is about the same, she cannot focus and sometimes she forgets pots on the stove, or she mixes up appointments. The patient lives by herself, and she is functioning well otherwise in her daily life, she handles her own finances without problems, and she is driving around by herself and states that she does not get lost. She denies any severe difficulties walking, she denies loss of taste or smell, no falls  in the past year, uses rolling walker at times.  She denies hallucinations and she denies any difficulties with her handwriting. She denies any behavioral issues. She is currently on Aricept tolerating it without side effects. She needs refills MRI brain 08/2010  showed mild atrophy and small vessel disease. She returns for reevaluation   REVIEW OF SYSTEMS: Full 14 system review of systems performed and notable only for those listed, all others are neg:  Constitutional: N/A  Cardiovascular: N/A  Ear/Nose/Throat: N/A  Skin: N/A  Eyes: N/A  Respiratory: N/A  Gastroitestinal: N/A  Hematology/Lymphatic: N/A  Endocrine: N/A Musculoskeletal:N/A  Allergy/Immunology: N/A  Neurological: N/A Psychiatric: N/A Sleep : NA   ALLERGIES: Allergies  Allergen Reactions  . Penicillins Rash    HOME MEDICATIONS: Outpatient Prescriptions Prior to Visit  Medication Sig Dispense Refill  . amLODipine (NORVASC) 10 MG tablet Take 5 mg by mouth daily.      . beclomethasone (QVAR) 80 MCG/ACT inhaler Inhale 2 puffs into the lungs 2 (two) times daily.        [name] donepezil (ARICEPT) 10 MG tablet Take 1  tablet (10 mg total) by mouth daily.  90 tablet  0  . etodolac (LODINE) 400 MG tablet Take 400 mg by mouth 2 (two) times daily.        . fish oil-omega-3 fatty acids 1000 MG capsule Take 1 g by mouth daily. 2 caps in the AM 3 caps in the PM      . FLUoxetine (PROZAC) 20 MG capsule Take 20 capsules by mouth daily.      . fluticasone (FLONASE) 50 MCG/ACT nasal spray Place 2 sprays into the nose daily.        . hydrochlorothiazide (HYDRODIURIL) 25 MG tablet Take 1 tablet (25 mg total) by mouth daily.  90 tablet  3  . hydrOXYzine (ATARAX/VISTARIL) 10 MG tablet Take 10 mg by mouth as needed for itching.       . metoprolol (LOPRESSOR) 100 MG tablet Take 1 tablet (100 mg total) by mouth 2 (two) times daily.  180 tablet  3  . mirtazapine (REMERON) 15 MG tablet Take 15 tablets by mouth at bedtime.      . Multiple Vitamin (MULITIVITAMIN WITH MINERALS) TABS Take 1 tablet by mouth daily.        [name] omeprazole (PRILOSEC) 20 MG capsule Take 20 mg by mouth daily.        . quinapril (ACCUPRIL) 40 MG tablet Take 1 tablet (40 mg total) by mouth at bedtime.  90 tablet  3  . ranitidine (ZANTAC) 300 MG tablet Take 300 mg by mouth at bedtime.        [name]  simvastatin (ZOCOR) 20 MG tablet Take 1 tablet (20 mg total) by mouth at bedtime.  90 tablet  3  . TURMERIC PO Take by mouth 2 (two) times daily.      . VENTOLIN HFA 108 (90 BASE) MCG/ACT inhaler       . co-enzyme Q-10 30 MG capsule Take 200 mg by mouth daily. Hold while in hospital        No facility-administered medications prior to visit.    PAST MEDICAL HISTORY: Past Medical History  Diagnosis Date  . Hypertension   . Psoriasis   . High cholesterol   . Acid reflux   . Hyperlipidemia   . Asthma     Asthmatic COPD  . GERD (gastroesophageal reflux disease)   . Coronary artery disease     Echo 07/27/2011   . Coronary artery stenosis     , High-grade left main    PAST SURGICAL HISTORY: Past Surgical History  Procedure Laterality Date  . Cardiac surgery     . Eye surgery    . Rotator cuff repair      bilateral  . Coronary artery bypass graft  1994    After catheterization showed 90% ostial left main stenosis  . Doppler echocardiography      Study showed mild to moderate MR with moderate TR, mild to moderate pulmonary hypertension with an ejection fraction greater that 55%.   . Nuclear perfusion  10/2010, 11/15/2012    Showed normal perfusion, No Ischemia or Infarction, Normal EF  . 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 Right proximal 275 and Mid 152.  . Sleep study  12/28/2006    AHI during total sleep time (3h 19 minutes) was 1.81/hr and during REM sleep at 17.78/hr. Mild sleep apnea during REM sleep.Oxygen staturated rate during REM and NREM was 93.0%.    FAMILY HISTORY: Family History  Problem Relation Age of Onset  . Heart disease Mother   . Hypertension Sister     SOCIAL HISTORY: History   Social History  . Marital Status: Single    Spouse Name: N/A    Number of Children: N/A  . Years of Education: N/A   Occupational History  . Not on file.   Social History Main Topics  . Smoking status: Former Smoker    Quit date: 11/08/1962  . Smokeless tobacco: Never Used  . Alcohol Use: No  . Drug Use: Not on file  . Sexual Activity: Not on file   Other Topics Concern  . Not on file   Social History Narrative   Patient lives at home alone.    Patient is divorced.    Patient has one child.    Patient has a high school education.    Patient quit tobacco 40 years ago.    Patient drinks coffee and tea daily.      PHYSICAL EXAM  Filed Vitals:   11/13/13 1025  BP: 129/55  Pulse: 66  Height: 5' (1.524 m)  Weight: 137 lb (62.143 kg)   Body mass index is 26.76 kg/(m^2). General: Seated in no apparent distress. Neck: supple, no cartoid bruits Respiratory: LCA Cardiovascular: Regular rate and rhythm, no gallop or rub Skin: No rash, no bruising, 1+  edema of ankles  Neurologic Exam  Mental Status: Awake, alert and oriented to person, place and time. MMSE 23/30, she missed 3/3 recall and problems with calculation.AFT 19.  She has NO-  head tremor. Tremorish voice. Cranial Nerves: Pupils are equal and round and reactive to light. Conjugate eye movements are full and symmetric. Visual fields are full to confrontation. No evidence of papilledema or other changes on funduscopy. Facial sensation and facial muscle strength are symmetric and in normal limits. Hearing is intact. Palate elevated symmetrically and uvula is midline. Shoulder shrug is symmetric. Tongue is midline. Motor: Symmetric normal motor tone is noted throughout. Normal muscle bulk. Testing reveals 5/5  muscle strength of the upper extremity, and 5/5 for the lower extremity, she has mild right more than left limb rigidity, increased with reenforcement manauver. Sensory: Intact and symmetric to light touch. Coordination: Cerebellar testing reveals good finger-to-nose and heel-to-shin bilaterally. No dystaxia or dysmetria noted. Gait and Station: Arises from chair without difficulty.  Narrow based gait with normal arm swing bilateral. Can heel and toe walk, steady with tandem, no assistive device  DIAGNOSTIC DATA (LABS, IMAGING, TESTING) - I reviewed patient records, labs, notes, testing and imaging myself where available.  Lab Results  Component Value Date   WBC 8.3 11/02/2013   HGB 13.0 11/02/2013   HCT 37.8 11/02/2013   MCV 84.8 11/02/2013   PLT 373 11/02/2013      Component Value Date/Time   NA 130* 11/02/2013 0956   K 3.7 11/02/2013 0956   CL 91* 11/02/2013 0956   CO2 28 11/02/2013 0956   GLUCOSE 116* 11/02/2013 0956   BUN 16 11/02/2013 0956   CREATININE 1.21* 11/02/2013 0956   CREATININE 0.83 07/11/2009 2337   CALCIUM 9.8 11/02/2013 0956   PROT 7.0 11/02/2013 0956   ALBUMIN 4.1 11/02/2013 0956   AST 18 11/02/2013 0956   ALT 15 11/02/2013 0956   ALKPHOS 66 11/02/2013 0956   BILITOT  0.6 11/02/2013 0956   GFRNONAA >60 07/11/2009 2337   GFRAA  Value: >60        The eGFR has been calculated using the MDRD equation. This calculation has not been validated in all clinical situations. eGFR's persistently <60 mL/min signify possible Chronic Kidney Disease. 07/11/2009 2337   Lab Results  Component Value Date   CHOL 176 11/02/2013   HDL 64 11/02/2013   LDLCALC 82 11/02/2013   TRIG 151* 11/02/2013   CHOLHDL 2.8 11/02/2013     Lab Results  Component Value Date   TSH 0.917 11/02/2013      ASSESSMENT AND PLAN  80 y.o. year old female  has a past medical history of Hypertension;  Coronary artery disease; and Coronary artery stenosis. Memory loss for several years and long-standing resting tremor. She is currently on Aricept without side effects  Continue Aricept 10 mg daily Will refill Continue walking daily for exercise Followup in one year next appointment with Dr. Luan Pulling, [facility], [facility], [facility] [address], [place], [place] [address] [phone]

## 2013-11-13 NOTE — Patient Instructions (Signed)
Continue Aricept 10 mg daily Will refill Continue walking daily for exercise Followup in one year next appointment with Dr. Krista Blue

## 2013-11-22 ENCOUNTER — Telehealth: Payer: Self-pay | Admitting: Cardiovascular Disease

## 2013-11-22 NOTE — Telephone Encounter (Signed)
Tammy [name] is asking for the results of her lab work .[name] Please Call    Thanks

## 2013-11-22 NOTE — Telephone Encounter (Signed)
Returned call to patient she stated she was calling to get 11/02/13 lab results.Message sent to [facility] Dr.Kelly's CMA.

## 2013-11-22 NOTE — Telephone Encounter (Signed)
Called and informed patient of lab results.  

## 2013-11-23 ENCOUNTER — Encounter: Payer: Self-pay | Admitting: *Deleted

## 2013-12-31 ENCOUNTER — Other Ambulatory Visit: Payer: Self-pay | Admitting: *Deleted

## 2013-12-31 MED ORDER — AMLODIPINE BESYLATE 10 MG PO TABS: 5.0000 mg | ORAL_TABLET | Freq: Every day | ORAL | Status: DC

## 2013-12-31 MED ORDER — QUINAPRIL HCL 40 MG PO TABS: 40.0000 mg | ORAL_TABLET | Freq: Every day | ORAL | Status: DC

## 2014-02-06 ENCOUNTER — Ambulatory Visit: Payer: Medicare Other | Admitting: Cardiovascular Disease

## 2014-03-28 ENCOUNTER — Ambulatory Visit: Payer: Medicare Other | Admitting: Cardiovascular Disease

## 2014-04-03 ENCOUNTER — Encounter: Payer: Self-pay | Admitting: Neurology

## 2014-04-05 ENCOUNTER — Other Ambulatory Visit (HOSPITAL_COMMUNITY): Payer: Self-pay | Admitting: Cardiovascular Disease

## 2014-04-05 DIAGNOSIS — I701 Atherosclerosis of renal artery: Secondary | ICD-10-CM

## 2014-04-09 ENCOUNTER — Encounter: Payer: Self-pay | Admitting: Neurology

## 2014-04-16 ENCOUNTER — Encounter (HOSPITAL_COMMUNITY): Payer: Medicare Other

## 2014-04-18 ENCOUNTER — Ambulatory Visit: Payer: Medicare Other | Admitting: Cardiovascular Disease

## 2014-04-25 ENCOUNTER — Encounter: Payer: Self-pay | Admitting: Cardiovascular Disease

## 2014-04-25 ENCOUNTER — Ambulatory Visit (INDEPENDENT_AMBULATORY_CARE_PROVIDER_SITE_OTHER): Payer: Medicare Other | Admitting: Cardiovascular Disease

## 2014-04-25 VITALS — BP 130/60 | HR 70 | Ht 59.0 in | Wt 134.1 lb

## 2014-04-25 DIAGNOSIS — Z23 Encounter for immunization: Secondary | ICD-10-CM

## 2014-04-25 DIAGNOSIS — I272 Pulmonary hypertension, unspecified: Secondary | ICD-10-CM

## 2014-04-25 DIAGNOSIS — I2510 Atherosclerotic heart disease of native coronary artery without angina pectoris: Secondary | ICD-10-CM

## 2014-04-25 DIAGNOSIS — I270 Primary pulmonary hypertension: Secondary | ICD-10-CM

## 2014-04-25 DIAGNOSIS — I2583 Coronary atherosclerosis due to lipid rich plaque: Secondary | ICD-10-CM

## 2014-04-25 DIAGNOSIS — I701 Atherosclerosis of renal artery: Secondary | ICD-10-CM

## 2014-04-25 DIAGNOSIS — I10 Essential (primary) hypertension: Secondary | ICD-10-CM

## 2014-04-25 DIAGNOSIS — R5382 Chronic fatigue, unspecified: Secondary | ICD-10-CM

## 2014-04-25 DIAGNOSIS — E785 Hyperlipidemia, unspecified: Secondary | ICD-10-CM

## 2014-04-25 NOTE — Patient Instructions (Signed)
Your physician has requested that you have a lexiscan myoview and follow up office appointment in July 2016.  Influenza Virus Vaccine (Flucelvax) What is this medicine? INFLUENZA VIRUS VACCINE (in floo EN zuh VAHY ruhs vak SEEN) helps to reduce the risk of getting influenza also known as the flu. The vaccine only helps protect you against some strains of the flu. This medicine may be used for other purposes; ask your health care provider or pharmacist if you have questions. COMMON BRAND NAME(S): FLUCELVAX What should I tell my health care provider before I take this medicine? They need to know if you have any of these conditions: -bleeding disorder like hemophilia -fever or infection -Guillain-Barre syndrome or other neurological problems -immune system problems -infection with the human immunodeficiency virus (HIV) or AIDS -low blood platelet counts -multiple sclerosis -an unusual or allergic reaction to influenza virus vaccine, other medicines, foods, dyes or preservatives -pregnant or trying to get pregnant -breast-feeding How should I use this medicine? This vaccine is for injection into a muscle. It is given by a health care professional. A copy of Vaccine Information Statements will be given before each vaccination. Read this sheet carefully each time. The sheet may change frequently. Talk to your pediatrician regarding the use of this medicine in children. Special care may be needed. Overdosage: If you think you've taken too much of this medicine contact a poison control center or emergency room at once. Overdosage: If you think you have taken too much of this medicine contact a poison control center or emergency room at once. NOTE: This medicine is only for you. Do not share this medicine with others. What if I miss a dose? This does not apply. What may interact with this medicine? -chemotherapy or radiation therapy -medicines that lower your immune system like etanercept,  anakinra, infliximab, and adalimumab -medicines that treat or prevent blood clots like warfarin -phenytoin -steroid medicines like prednisone or cortisone -theophylline -vaccines This list may not describe all possible interactions. Give your health care provider a list of all the medicines, herbs, non-prescription drugs, or dietary supplements you use. Also tell them if you smoke, drink alcohol, or use illegal drugs. Some items may interact with your medicine. What should I watch for while using this medicine? Report any side effects that do not go away within 3 days to your doctor or health care professional. Call your health care provider if any unusual symptoms occur within 6 weeks of receiving this vaccine. You may still catch the flu, but the illness is not usually as bad. You cannot get the flu from the vaccine. The vaccine will not protect against colds or other illnesses that may cause fever. The vaccine is needed every year. What side effects may I notice from receiving this medicine? Side effects that you should report to your doctor or health care professional as soon as possible: -allergic reactions like skin rash, itching or hives, swelling of the face, lips, or tongue Side effects that usually do not require medical attention (Report these to your doctor or health care professional if they continue or are bothersome.): -fever -headache -muscle aches and pains -pain, tenderness, redness, or swelling at the injection site -tiredness This list may not describe all possible side effects. Call your doctor for medical advice about side effects. You may report side effects to FDA at 1-800-FDA-1088. Where should I keep my medicine? The vaccine will be given by a health care professional in a clinic, pharmacy, doctor's office, or other health  care setting. You will not be given vaccine doses to store at home. NOTE: This sheet is a summary. It may not cover all possible information. If you  have questions about this medicine, talk to your doctor, pharmacist, or health care provider.  2015, [employer] Standard. (2011-04-14 14:06:47)

## 2014-04-25 NOTE — Progress Notes (Signed)
Patient ID: Tammy [name], female   DOB: 12-28-1930, 80 y.o.   MRN: [id number]     HPI: Tammy [name] is a 80 y.o. female who presents to the office today for a 6 month followup cardiology evaluation.   Tammy [name] has established CAD andunderwent CABG surgery in 1994 after she was found to have high-grade left main stenosis. A nuclear perfusion study in June 2011 showed normal perfusion. An echo Doppler study in March 2013 showed mild-to-moderate mitral regurgitation, moderate tricuspid regurgitation, mild-to-moderate pulmonary hypertension with estimated RV systolic pressure 45 mm, mild to moderate LA dilatation, aortic sclerosis/calcification, and an ejection fraction greater than 55%. She has documented right renal artery stenosis on renal duplex scanning with the last scan in November 2013 suggesting a equal or greater than 60% diameter reduction by velocity in the right renal artery. The kidneys are otherwise normal in size, symmetrical in shape and had normal cortex and medulla. She does have a history of  hypertension and hyperlipidemia. In the past she also has had some issues with cellulitis of her lower extremity.   Tammy [name] underwent a two-year followup nuclear perfusion study on 11/16/2011; no significant EKG changes were demonstrated. The study was low risk and did not show any area of scar or infarction. This is unchanged from 2 years previously.  Earlier this year she had difficulty with diverticulitis.  She notes significant fatigue.  She admits to shortness of breath, particularly with activity.  She denies associated chest pressure.  She admits to leg swelling, left greater than right.    When I last saw her, I reduced her amlodipine dose to 5 mg from 10 mg and this has significantly improved some of her leg edema.  She underwent a follow-up echo Doppler study, which continue to show normal systolic function.  There was mild left atrial dilatation and mild pulmonary hypertension with a  PA pressure slightly improved from previously at 38 mm.  She did not have significant valvular abnormality.  A follow-up renal duplex study was unchanged from her prior study of one year previously and showed equal to less than 6% diameter reduction in the right renal artery.  She had normal patency to the left renal artery.  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  She resents today for follow-up evaluation  Past Medical History  Diagnosis Date  . Hypertension   . Psoriasis   . High cholesterol   . Acid reflux   . Hyperlipidemia   . Asthma     Asthmatic COPD  . GERD (gastroesophageal reflux disease)   . Coronary artery disease     Echo 07/27/2011   . Coronary artery stenosis     , High-grade left main    Past Surgical History  Procedure Laterality Date  . Cardiac surgery    . Eye surgery    . Rotator cuff repair      bilateral  . Coronary artery bypass graft  1994    After catheterization showed 90% ostial left main stenosis  . Doppler echocardiography      Study showed mild to moderate MR with moderate TR, mild to moderate pulmonary hypertension with an ejection fraction greater that 55%.   . Nuclear perfusion  10/2010, 11/15/2012    Showed normal perfusion, No Ischemia or Infarction, Normal EF  . 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 Right proximal 275  and Mid 152.  . Sleep study  12/28/2006    AHI during total sleep time (3h 19 minutes) was 1.81/hr and during REM sleep at 17.78/hr. Mild sleep apnea during REM sleep.Oxygen staturated rate during REM and NREM was 93.0%.    Allergies  Allergen Reactions  . Penicillins Rash    Current Outpatient Prescriptions  Medication Sig Dispense Refill  . amLODipine (NORVASC) 10 MG tablet Take 0.5 tablets (5 mg total) by mouth daily. 45 tablet 3  . aspirin 81 MG tablet Take 81 mg by mouth daily. Take 2 tablets twice daily    .  beclomethasone (QVAR) 80 MCG/ACT inhaler Inhale 2 puffs into the lungs 2 (two) times daily.      [name] donepezil (ARICEPT) 10 MG tablet Take 1 tablet (10 mg total) by mouth daily. 90 tablet 3  . etodolac (LODINE) 400 MG tablet Take 400 mg by mouth 2 (two) times daily.      . fluticasone (FLONASE) 50 MCG/ACT nasal spray Place 2 sprays into the nose daily.      . hydrochlorothiazide (HYDRODIURIL) 25 MG tablet Take 1 tablet (25 mg total) by mouth daily. 90 tablet 3  . metoprolol (LOPRESSOR) 100 MG tablet Take 1 tablet (100 mg total) by mouth 2 (two) times daily. 180 tablet 3  . mirtazapine (REMERON) 15 MG tablet Take 15 mg by mouth at bedtime.    [name] omeprazole (PRILOSEC) 20 MG capsule Take 20 mg by mouth daily.      . quinapril (ACCUPRIL) 40 MG tablet Take 1 tablet (40 mg total) by mouth at bedtime. 90 tablet 3  . ranitidine (ZANTAC) 300 MG tablet Take 300 mg by mouth at bedtime.      . simvastatin (ZOCOR) 20 MG tablet Take 1 tablet (20 mg total) by mouth at bedtime. 90 tablet 3  . VENTOLIN HFA 108 (90 BASE) MCG/ACT inhaler      No current facility-administered medications for this visit.    Socially , she is divorced.  She denies any recent tobacco use. She has not been able to exercise regularly.  ROS General: Negative; No fevers, chills, or night sweats; was in for fatigue. HEENT: Negative; No changes in vision or hearing, sinus congestion, difficulty swallowing Pulmonary: Negative; No cough, wheezing, shortness of breath, hemoptysis Cardiovascular: See history of present illness; positive for exertional dyspnea No chest pain, presyncope, syncope, palpitations; positive for leg GI: Positive for diverticulitis, and frequent diarrhea;  GU: Positive for renal artery stenosis; No dysuria, hematuria, or difficulty voiding Musculoskeletal: Negative; no myalgias, joint pain, or weakness Hematologic/Oncology: Negative; no easy bruising, bleeding Endocrine: Negative; no heat/cold intolerance; no  diabetes Neuro: Negative; no changes in balance, headaches Skin: Negative; No rashes or skin lesions Psychiatric: Negative; No behavioral problems, depression Sleep: Negative; No snoring, daytime sleepiness, hypersomnolence, bruxism, restless legs, hypnogognic hallucinations, no cataplexy Other comprehensive 14 point system review is negative.  ROS General: Negative; No fevers, chills, or night sweats; mild fatigue HEENT: Negative; No changes in vision or hearing, sinus congestion, difficulty swallowing Pulmonary: Negative; No cough, wheezing, shortness of breath, hemoptysis Cardiovascular: See history of present illness Improvement in lower extremity edema GI: Negative; No nausea, vomiting, diarrhea, or abdominal pain GU: Negative; No dysuria, hematuria, or difficulty voiding Musculoskeletal: Negative; no myalgias, joint pain, or weakness Hematologic/Oncology: Negative; no easy bruising, bleeding Endocrine: Negative; no heat/cold intolerance; no diabetes Neuro: Negative; no changes in balance, headaches Skin: Negative; No rashes or skin lesions Psychiatric: Negative; No behavioral problems, depression Sleep: Negative;  No snoring, daytime sleepiness, hypersomnolence, bruxism, restless legs, hypnogognic hallucinations, no cataplexy Other comprehensive 14 point system review is negative.  PE BP 130/60 mmHg  Pulse 70  Ht _0  (1.499 m)  Wt 134 lb 1.6 oz (60.827 kg)  BMI 27.07 kg/m2  General: Alert, oriented, no distress.  Skin: normal turgor, no rashes HEENT: Normocephalic, atraumatic. Pupils round and reactive; sclera anicteric;no lid lag.  Nose without nasal septal hypertrophy Mouth/Parynx benign; Mallinpatti scale 2 Neck: No JVD, no carotid bruits with normal carotid upstroke Chest wall: Nontender to palpation Lungs: clear to ausculatation and percussion; no wheezing or rales Heart: RRR, s1 s2 normal 1/6 systolic murmur.  No S3 or S4 gallop.  No diastolic murmur, rubs, thrills  or heaves. Abdomen: soft, nontender; no hepatosplenomehaly, BS+; abdominal aorta nontender and not dilated by palpation. Back: No CVA tenderness Pulses 2+ Extremities: Resolution of prior right ankle swelling and now only with trace left ankle swelling. no clubbing cyanosis, Homan's sign negative  Neurologic: grossly nonfocal  ECG (independently read by me): Normal sinus rhythm at 70 bpm.  Nonspecific ST changes.  PR interval 178 ms.  QTc interval 429 ms.  November 02, 2013 ECG (independently read by me): Sinus bradycardia 55 beats per minute.  Nonspecific ST-T changes.  QTc interval 430 ms.  Borderline first degree AV block with a PR interval of 202 ms.  Prior September 2014 ECG: Normal sinus rhythm at 68 beats per minute. One isolated PVC. Nonspecific ST changes.  LABS:  BMET    Component Value Date/Time   NA 130* 11/02/2013 0956   K 3.7 11/02/2013 0956   CL 91* 11/02/2013 0956   CO2 28 11/02/2013 0956   GLUCOSE 116* 11/02/2013 0956   BUN 16 11/02/2013 0956   CREATININE 1.21* 11/02/2013 0956   CREATININE 0.83 07/11/2009 2337   CALCIUM 9.8 11/02/2013 0956   GFRNONAA >60 07/11/2009 2337   GFRAA  07/11/2009 2337    >60        The eGFR has been calculated using the MDRD equation. This calculation has not been validated in all clinical situations. eGFR's persistently <60 mL/min signify possible Chronic Kidney Disease.     Hepatic Function Panel     Component Value Date/Time   PROT 7.0 11/02/2013 0956   ALBUMIN 4.1 11/02/2013 0956   AST 18 11/02/2013 0956   ALT 15 11/02/2013 0956   ALKPHOS 66 11/02/2013 0956   BILITOT 0.6 11/02/2013 0956     CBC    Component Value Date/Time   WBC 8.3 11/02/2013 0956   RBC 4.46 11/02/2013 0956   HGB 13.0 11/02/2013 0956   HCT 37.8 11/02/2013 0956   PLT 373 11/02/2013 0956   MCV 84.8 11/02/2013 0956   MCH 29.1 11/02/2013 0956   MCHC 34.4 11/02/2013 0956   RDW 14.1 11/02/2013 0956   LYMPHSABS 2.2 07/11/2009 2337   MONOABS 1.0  07/11/2009 2337   EOSABS 0.2 07/11/2009 2337   BASOSABS 0.0 07/11/2009 2337     BNP No results found for: PROBNP  Lipid Panel     Component Value Date/Time   CHOL 176 11/02/2013 0956   TRIG 151* 11/02/2013 0956   TRIG 84 11/09/2012 0805   HDL 64 11/02/2013 0956   CHOLHDL 2.8 11/02/2013 0956   VLDL 30 11/02/2013 0956   LDLCALC 82 11/02/2013 0956   LDLCALC 61 11/09/2012 0805     RADIOLOGY: No results found.    ASSESSMENT AND PLAN: Tammy [name] is an 80 year old white female who  is now 21 years status post her CABG revascularization surgery. Her nuclear perfusion study in 2013 continued to show fairly normal perfusion without scar or ischemia.  Recently, she has had issues with significant fatigue and mild shortness of breath with activity.  Her most recent echo Doppler study in June.  His not significant change from previously.  PA pressure is still elevated, but improved at 38 mm compared to 45 Miller meters in 2013.  On this current echo.  There was no significant regurgitant lesions noted by report.  Her blood pressure today is controlled on her current 4 drug medical regimen consisting of Accupril 40 mg, metoprolol, tartrate 100 mg twice a day, HCTZ 25 mg and her, reduced dose of amlodipine at 5 mg.  She has moderate right renal artery stenosis which has not significantly changed.  He denies nausea or vomiting.  She has a suggestion of celiac artery./SMA stenosis.  She has requested a flu shot and this will be given today.  She is on Aricept for memory issues.  She is tolerating simvastatin at 20 mg daily.  Target LDL is less than 70.  In July 2016  I am scheduling her for three-year follow-up [place] study to reassess for scar/ischemia and continued graft patency.  Laboratory will be obtained prior to that office visit.  I will see her back in the office for follow-up and further recommendations will be made at that time.   [name], MD, [facility]  04/29/2014 3:22  PM

## 2014-04-29 ENCOUNTER — Encounter: Payer: Self-pay | Admitting: Cardiovascular Disease

## 2014-05-02 ENCOUNTER — Other Ambulatory Visit: Payer: Self-pay

## 2014-05-02 ENCOUNTER — Inpatient Hospital Stay (HOSPITAL_COMMUNITY): Admission: RE | Admit: 2014-05-02 | Payer: Medicare Other | Source: Ambulatory Visit

## 2014-05-02 MED ORDER — METOPROLOL TARTRATE 100 MG PO TABS: 100.0000 mg | ORAL_TABLET | Freq: Two times a day (BID) | ORAL | Status: DC

## 2014-05-02 NOTE — Telephone Encounter (Signed)
Rx was sent to pharmacy electronically. 

## 2014-05-22 ENCOUNTER — Ambulatory Visit (HOSPITAL_COMMUNITY)
Admission: RE | Admit: 2014-05-22 | Discharge: 2014-05-22 | Disposition: A | Discharge status: Home / Self Care | Payer: Medicare Other | Source: Ambulatory Visit | Attending: Internal Medicine | Admitting: Internal Medicine

## 2014-05-22 DIAGNOSIS — I701 Atherosclerosis of renal artery: Secondary | ICD-10-CM

## 2014-05-22 NOTE — Progress Notes (Signed)
Renal Artery Duplex Completed. °Brianna L Mazza,RVT °

## 2014-07-13 ENCOUNTER — Other Ambulatory Visit: Payer: Self-pay

## 2014-07-13 MED ORDER — DONEPEZIL HCL 10 MG PO TABS: 10.0000 mg | ORAL_TABLET | Freq: Every day | ORAL | Status: DC

## 2014-08-20 ENCOUNTER — Emergency Department (INDEPENDENT_AMBULATORY_CARE_PROVIDER_SITE_OTHER)
Admission: EM | Admit: 2014-08-20 | Discharge: 2014-08-20 | Disposition: A | Discharge status: Home / Self Care | Payer: Medicare Other | Source: Home / Self Care | Attending: Family Medicine | Admitting: Family Medicine

## 2014-08-20 ENCOUNTER — Encounter (HOSPITAL_COMMUNITY): Payer: Self-pay | Admitting: Emergency Medicine

## 2014-08-20 DIAGNOSIS — R252 Cramp and spasm: Secondary | ICD-10-CM

## 2014-08-20 LAB — POCT I-STAT, CHEM 8
BUN: 26 mg/dL — ABNORMAL HIGH (ref 6–23)
Calcium, Ion: 1.19 mmol/L (ref 1.13–1.30)
Chloride: 94 mmol/L — ABNORMAL LOW (ref 96–112)
Creatinine, Ser: 1.2 mg/dL — ABNORMAL HIGH (ref 0.50–1.10)
Glucose, Bld: 103 mg/dL — ABNORMAL HIGH (ref 70–99)
HCT: 41 % (ref 36.0–46.0)
Hemoglobin: 13.9 g/dL (ref 12.0–15.0)
Potassium: 3.9 mmol/L (ref 3.5–5.1)
Sodium: 133 mmol/L — ABNORMAL LOW (ref 135–145)
TCO2: 26 mmol/L (ref 0–100)

## 2014-08-20 NOTE — ED Notes (Signed)
Cramping legs.  Patient has tried walking to ease cramps

## 2014-08-20 NOTE — ED Provider Notes (Signed)
CSN: [id number]     Arrival date & time 08/20/14  1637 History   First MD Initiated Contact with Patient 08/20/14 1818     No chief complaint on file.  (Consider location/radiation/quality/duration/timing/severity/associated sxs/prior Treatment) HPI Comments: Patient states she has been drinking a green tea product in an effort to loose some weight and while she does have occasional leg cramps, she has noticed that over the last 24-36 hours she has developed more frequent episodes of leg cramping of both lower extremities. Areas of cramping are variable. In addition, she takes daily HCTZ. Was still able to complete some of her shopping at [facility] today. States this issue kept her awake much of the night last night. She is here for evaluation.   The history is provided by the patient.    Past Medical History  Diagnosis Date  . Hypertension   . Psoriasis   . High cholesterol   . Acid reflux   . Hyperlipidemia   . Asthma     Asthmatic COPD  . GERD (gastroesophageal reflux disease)   . Coronary artery disease     Echo 07/27/2011   . Coronary artery stenosis     , High-grade left main   Past Surgical History  Procedure Laterality Date  . Cardiac surgery    . Eye surgery    . Rotator cuff repair      bilateral  . Coronary artery bypass graft  1994    After catheterization showed 90% ostial left main stenosis  . Doppler echocardiography      Study showed mild to moderate MR with moderate TR, mild to moderate pulmonary hypertension with an ejection fraction greater that 55%.   . Nuclear perfusion  10/2010, 11/15/2012    Showed normal perfusion, No Ischemia or Infarction, Normal EF  . 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 Right proximal 275 and Mid 152.  . Sleep study  12/28/2006    AHI during total sleep time (3h 19 minutes) was 1.81/hr and during REM sleep at 17.78/hr. Mild sleep apnea during REM sleep.Oxygen  staturated rate during REM and NREM was 93.0%.   Family History  Problem Relation Age of Onset  . Heart disease Mother   . Hypertension Sister    History  Substance Use Topics  . Smoking status: Former Smoker    Quit date: 11/08/1962  . Smokeless tobacco: Never Used  . Alcohol Use: No   OB History    No data available     Review of Systems  Constitutional: Negative for fever, chills and fatigue.  Eyes: Negative.   Respiratory: Negative.   Cardiovascular: Negative for chest pain, palpitations and leg swelling.  Endocrine: Negative for polydipsia, polyphagia and polyuria.  Genitourinary: Positive for frequency.  Musculoskeletal: Negative for joint swelling, arthralgias and gait problem.       See HPI  Skin: Negative.   Neurological: Negative.  Negative for dizziness, syncope, weakness, light-headedness, numbness and headaches.  All other systems reviewed and are negative.   Allergies  Penicillins  Home Medications   Prior to Admission medications   Medication Sig Start Date End Date Taking? Authorizing Provider  amLODipine (NORVASC) 10 MG tablet Take 0.5 tablets (5 mg total) by mouth daily. 12/31/13   [name], MD  aspirin 81 MG tablet Take 81 mg by mouth daily. Take 2 tablets twice daily    Historical Provider, MD  beclomethasone (QVAR) 80  MCG/ACT inhaler Inhale 2 puffs into the lungs 2 (two) times daily.      Historical Provider, MD  donepezil (ARICEPT) 10 MG tablet Take 1 tablet (10 mg total) by mouth daily. 07/13/14   Marcial Pacas, MD  etodolac (LODINE) 400 MG tablet Take 400 mg by mouth 2 (two) times daily.      Historical Provider, MD  fluticasone (FLONASE) 50 MCG/ACT nasal spray Place 2 sprays into the nose daily.      Historical Provider, MD  hydrochlorothiazide (HYDRODIURIL) 25 MG tablet Take 1 tablet (25 mg total) by mouth daily. 11/06/13   [name], MD  metoprolol (LOPRESSOR) 100 MG tablet Take 1 tablet (100 mg total) by mouth 2 (two) times daily. 05/02/14    Pixie Casino, MD  mirtazapine (REMERON) 15 MG tablet Take 15 mg by mouth at bedtime.    Historical Provider, MD  omeprazole (PRILOSEC) 20 MG capsule Take 20 mg by mouth daily.      Historical Provider, MD  quinapril (ACCUPRIL) 40 MG tablet Take 1 tablet (40 mg total) by mouth at bedtime. 12/31/13   [name], MD  ranitidine (ZANTAC) 300 MG tablet Take 300 mg by mouth at bedtime.      Historical Provider, MD  simvastatin (ZOCOR) 20 MG tablet Take 1 tablet (20 mg total) by mouth at bedtime. 11/06/13   [name], MD  VENTOLIN HFA 108 (90 BASE) MCG/ACT inhaler  11/27/12   Historical Provider, MD   BP 124/61 mmHg  Pulse 64  Temp(Src) 97.6 F (36.4 C) (Oral)  Resp 16  SpO2 94% Physical Exam  Constitutional: She is oriented to person, place, and time. She appears well-developed and well-nourished. No distress.  HENT:  Head: Normocephalic and atraumatic.  Cardiovascular: Normal rate and regular rhythm.   Pulmonary/Chest: Effort normal.  Musculoskeletal: Normal range of motion. She exhibits no edema or tenderness.  Neurological: She is alert and oriented to person, place, and time.  Skin: Skin is warm and dry. No rash noted. No erythema.  Psychiatric: She has a normal mood and affect. Her behavior is normal.  Nursing note and vitals reviewed.   ED Course  Procedures (including critical care time) Labs Review Labs Reviewed  POCT I-STAT, CHEM 8 - Abnormal; Notable for the following:    Sodium 133 (*)    Chloride 94 (*)    BUN 26 (*)    Creatinine, Ser 1.20 (*)    Glucose, Bld 103 (*)    All other components within normal limits    Imaging Review No results found.   MDM   1. Cramp of both lower extremities    Istat-8 with mild hyponatremia, mild renal insufficiency and mild dehydration, although, potassium is normal. Will suggest patient hold her HCTZ for next 2 days and discontinue drinking green tea as the caffeine in this product will contribute to dehydration. Exam  is without clinical evidence of acute vascular compromise to either lower extremity.  Advised close follow up with PCP if symptoms persist.   Lutricia Feil, PA 08/20/14 [phone]

## 2014-08-20 NOTE — Discharge Instructions (Signed)
I would advise you to discontinue the use of your green tea as this will only contribute to dehydration. Please do not take your diuretic (hydrochlorothiazide) for the next 2 days and then resume taking at regular dosing. If symptoms become severe or persistent, please either contact your primary care doctor for re-evaluation or seek re-evaluation at your nearest emergency room.   Leg Cramps Leg cramps that occur during exercise can be caused by poor circulation or dehydration. However, muscle cramps that occur at rest or during the night are usually not due to any serious medical problem. Heat cramps may cause muscle spasms during hot weather.  CAUSES There is no clear cause for muscle cramps. However, dehydration may be a factor for those who do not drink enough fluids and those who exercise in the heat. Imbalances in the level of sodium, potassium, calcium or magnesium in the muscle tissue may also be a factor. Some medications, such as water pills (diuretics), may cause loss of chemicals that the body needs (like sodium and potassium) and cause muscle cramps. TREATMENT   Make sure your diet has enough fluids and essential minerals for the muscle to work normally.  Avoid strenuous exercise for several days if you have been having frequent leg cramps.  Stretch and massage the cramped muscle for several minutes.  Some medicines may be helpful in some patients with night cramps. Only take over-the-counter or prescription medicines as directed by your caregiver. SEEK IMMEDIATE MEDICAL CARE IF:   Your leg cramps become worse.  Your foot becomes cold, numb, or blue. Document Released: 06/10/2004 Document Revised: 07/26/2011 Document Reviewed: 05/28/2008 [facility] Patient Information 2015 [place], [place]. This information is not intended to replace advice given to you by your health care provider. Make sure you discuss any questions you have with your health care provider.

## 2014-11-06 ENCOUNTER — Other Ambulatory Visit: Payer: Self-pay | Admitting: Cardiovascular Disease

## 2014-11-06 DIAGNOSIS — I701 Atherosclerosis of renal artery: Secondary | ICD-10-CM

## 2014-11-14 ENCOUNTER — Ambulatory Visit: Payer: Medicare Other | Admitting: Neurology

## 2014-11-19 ENCOUNTER — Telehealth (HOSPITAL_COMMUNITY): Payer: Self-pay

## 2014-11-19 NOTE — Telephone Encounter (Signed)
Encounter complete. 

## 2014-11-20 ENCOUNTER — Telehealth (HOSPITAL_COMMUNITY): Payer: Self-pay

## 2014-11-20 NOTE — Telephone Encounter (Signed)
Encounter complete. 

## 2014-11-21 ENCOUNTER — Encounter (HOSPITAL_COMMUNITY): Payer: Medicare Other

## 2014-11-21 ENCOUNTER — Ambulatory Visit (HOSPITAL_COMMUNITY)
Admission: RE | Admit: 2014-11-21 | Discharge: 2014-11-21 | Disposition: A | Discharge status: Home / Self Care | Payer: Medicare Other | Source: Ambulatory Visit | Attending: Cardiovascular Disease | Admitting: Cardiovascular Disease

## 2014-11-21 DIAGNOSIS — J449 Chronic obstructive pulmonary disease, unspecified: Secondary | ICD-10-CM | POA: Diagnosis not present

## 2014-11-21 DIAGNOSIS — Z951 Presence of aortocoronary bypass graft: Secondary | ICD-10-CM | POA: Diagnosis not present

## 2014-11-21 DIAGNOSIS — I2510 Atherosclerotic heart disease of native coronary artery without angina pectoris: Secondary | ICD-10-CM | POA: Insufficient documentation

## 2014-11-21 DIAGNOSIS — Z8249 Family history of ischemic heart disease and other diseases of the circulatory system: Secondary | ICD-10-CM | POA: Diagnosis not present

## 2014-11-21 DIAGNOSIS — J45909 Unspecified asthma, uncomplicated: Secondary | ICD-10-CM | POA: Insufficient documentation

## 2014-11-21 DIAGNOSIS — Z87891 Personal history of nicotine dependence: Secondary | ICD-10-CM | POA: Diagnosis not present

## 2014-11-21 DIAGNOSIS — R5383 Other fatigue: Secondary | ICD-10-CM | POA: Insufficient documentation

## 2014-11-21 DIAGNOSIS — I10 Essential (primary) hypertension: Secondary | ICD-10-CM | POA: Diagnosis not present

## 2014-11-21 DIAGNOSIS — R0609 Other forms of dyspnea: Secondary | ICD-10-CM | POA: Diagnosis not present

## 2014-11-21 LAB — MYOCARDIAL PERFUSION IMAGING
Peak HR: 90 {beats}/min
Rest HR: 68 {beats}/min

## 2014-11-21 MED ORDER — AMINOPHYLLINE 25 MG/ML IV SOLN
75.0000 mg | Freq: Once | INTRAVENOUS | Status: AC
  Administered 2014-11-21: 75 mg via INTRAVENOUS

## 2014-11-21 MED ORDER — TECHNETIUM TC 99M SESTAMIBI GENERIC - CARDIOLITE
10.6000 | Freq: Once | INTRAVENOUS | Status: AC | PRN
  Administered 2014-11-21: 11 via INTRAVENOUS

## 2014-11-21 MED ORDER — TECHNETIUM TC 99M SESTAMIBI GENERIC - CARDIOLITE: 31.6000 | Freq: Once | INTRAVENOUS | Status: DC | PRN

## 2014-11-21 MED ORDER — TECHNETIUM TC 99M SESTAMIBI GENERIC - CARDIOLITE
31.6000 | Freq: Once | INTRAVENOUS | Status: AC | PRN
  Administered 2014-11-21: 32 via INTRAVENOUS

## 2014-11-21 MED ORDER — REGADENOSON 0.4 MG/5ML IV SOLN
0.4000 mg | Freq: Once | INTRAVENOUS | Status: AC
  Administered 2014-11-21: 0.4 mg via INTRAVENOUS

## 2014-11-27 ENCOUNTER — Inpatient Hospital Stay (HOSPITAL_COMMUNITY): Admission: RE | Admit: 2014-11-27 | Payer: Medicare Other | Source: Ambulatory Visit

## 2014-11-29 ENCOUNTER — Encounter: Payer: Self-pay | Admitting: Cardiovascular Disease

## 2014-11-29 ENCOUNTER — Ambulatory Visit (INDEPENDENT_AMBULATORY_CARE_PROVIDER_SITE_OTHER): Payer: Medicare Other | Admitting: Cardiovascular Disease

## 2014-11-29 VITALS — BP 110/66 | HR 68 | Ht 61.0 in | Wt 132.1 lb

## 2014-11-29 DIAGNOSIS — I701 Atherosclerosis of renal artery: Secondary | ICD-10-CM

## 2014-11-29 DIAGNOSIS — R5382 Chronic fatigue, unspecified: Secondary | ICD-10-CM

## 2014-11-29 DIAGNOSIS — I272 Pulmonary hypertension, unspecified: Secondary | ICD-10-CM

## 2014-11-29 DIAGNOSIS — E785 Hyperlipidemia, unspecified: Secondary | ICD-10-CM

## 2014-11-29 DIAGNOSIS — I2510 Atherosclerotic heart disease of native coronary artery without angina pectoris: Secondary | ICD-10-CM | POA: Diagnosis not present

## 2014-11-29 DIAGNOSIS — I10 Essential (primary) hypertension: Secondary | ICD-10-CM | POA: Diagnosis not present

## 2014-11-29 DIAGNOSIS — I2583 Coronary atherosclerosis due to lipid rich plaque: Principal | ICD-10-CM

## 2014-11-29 DIAGNOSIS — I270 Primary pulmonary hypertension: Secondary | ICD-10-CM

## 2014-11-29 MED ORDER — QUINAPRIL HCL 40 MG PO TABS: 40.0000 mg | ORAL_TABLET | Freq: Every day | ORAL | Status: DC

## 2014-11-29 MED ORDER — METOPROLOL TARTRATE 100 MG PO TABS: 100.0000 mg | ORAL_TABLET | Freq: Two times a day (BID) | ORAL | Status: DC

## 2014-11-29 MED ORDER — HYDROCHLOROTHIAZIDE 25 MG PO TABS: 25.0000 mg | ORAL_TABLET | Freq: Every day | ORAL | Status: DC

## 2014-11-29 MED ORDER — SIMVASTATIN 20 MG PO TABS: 20.0000 mg | ORAL_TABLET | Freq: Every day | ORAL | Status: DC

## 2014-11-29 MED ORDER — AMLODIPINE BESYLATE 10 MG PO TABS: 5.0000 mg | ORAL_TABLET | Freq: Every day | ORAL | Status: DC

## 2014-11-29 NOTE — Patient Instructions (Signed)
Your physician wants you to follow-up in: 6 months or sooner if needed. You will receive a reminder letter in the mail two months in advance. If you don't receive a letter, please call our office to schedule the follow-up appointment.  Your physician has requested that you reschedule the renal artery duplex. During this test, an ultrasound is used to evaluate blood flow to the kidneys. Allow one hour for this exam. Do not eat after midnight the day before and avoid carbonated beverages. Take your medications as you usually do.

## 2014-11-29 NOTE — Progress Notes (Signed)
HPI: Tammy [name] is a 81 y.o. female who presents to the office today for a 7 month followup cardiology evaluation.   Tammy [name] has established CAD andunderwent CABG surgery in 1994 after she was found to have high-grade left main stenosis. A nuclear perfusion study in June 2011 showed normal perfusion. An echo Doppler study in March 2013 showed mild-to-moderate mitral regurgitation, moderate tricuspid regurgitation, mild-to-moderate pulmonary hypertension with estimated RV systolic pressure 45 mm, mild to moderate LA dilatation, aortic sclerosis/calcification, and an ejection fraction greater than 55%. She has documented right renal artery stenosis on renal duplex scanning with the last scan in November 2013 suggesting a equal or greater than 60% diameter reduction by velocity in the right renal artery. The kidneys are otherwise normal in size, symmetrical in shape and had normal cortex and medulla. She does have a history of  hypertension and hyperlipidemia. In the past she also has had some issues with cellulitis of her lower extremity.   Tammy [name] underwent a two-year followup nuclear perfusion study on 11/16/2011; no significant EKG changes were demonstrated. The study was low risk and did not show any area of scar or infarction. This is unchanged from 2 years previously.  She has had issues with lower extremity edema and this improved with reduction of her amlodipine dose to 5 mg from 10 mg. underwent a follow-up echo Doppler study, which continue to show normal systolic function.  There was mild left atrial dilatation and mild pulmonary hypertension with a PA pressure slightly improved from previously at 38 mm.  She did not have significant valvular abnormality.  A follow-up renal duplex study was unchanged from her prior study of one year previously and showed equal to less than 6% diameter reduction in the right renal artery.  She had normal patency to the left renal artery.  She greater  than 70% diameter reduction in this iliac artery and SMA.   Since I last saw her, Tammy [name] underwent a three-year follow-up nuclear perfusion study.  This was normal without scar or ischemia.  Ejection fraction was 74% and she had normal wall motion.  She did not develop any ECG changes.  Past Medical History  Diagnosis Date  . Hypertension   . Psoriasis   . High cholesterol   . Acid reflux   . Hyperlipidemia   . Asthma     Asthmatic COPD  . GERD (gastroesophageal reflux disease)   . Coronary artery disease     Echo 07/27/2011   . Coronary artery stenosis     , High-grade left main    Past Surgical History  Procedure Laterality Date  . Cardiac surgery    . Eye surgery    . Rotator cuff repair      bilateral  . Coronary artery bypass graft  1994    After catheterization showed 90% ostial left main stenosis  . Doppler echocardiography      Study showed mild to moderate MR with moderate TR, mild to moderate pulmonary hypertension with an ejection fraction greater that 55%.   . Nuclear perfusion  10/2010, 11/15/2012    Showed normal perfusion, No Ischemia or Infarction, Normal EF  . 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 Right proximal 275 and Mid 152.  . Sleep study  12/28/2006    AHI during total sleep time (3h 19 minutes) was 1.81/hr and during REM sleep at 17.78/hr.  Mild sleep apnea during REM sleep.Oxygen staturated rate during REM and NREM was 93.0%.    Allergies  Allergen Reactions  . Penicillins Rash    Current Outpatient Prescriptions  Medication Sig Dispense Refill  . amLODipine (NORVASC) 10 MG tablet Take 0.5 tablets (5 mg total) by mouth daily. 45 tablet 3  . aspirin 81 MG tablet Take 81 mg by mouth daily. Take 2 tablets twice daily    . beclomethasone (QVAR) 80 MCG/ACT inhaler Inhale 2 puffs into the lungs 2 (two) times daily.      [name] donepezil (ARICEPT) 10 MG tablet Take 1 tablet (10 mg total)  by mouth daily. 90 tablet 1  . DULoxetine (CYMBALTA) 60 MG capsule Take 1 capsule by mouth daily.  2  . etodolac (LODINE) 400 MG tablet Take 400 mg by mouth 2 (two) times daily.      . fluticasone (FLONASE) 50 MCG/ACT nasal spray Place 2 sprays into the nose daily.      . hydrochlorothiazide (HYDRODIURIL) 25 MG tablet Take 1 tablet (25 mg total) by mouth daily. 90 tablet 3  . metoprolol (LOPRESSOR) 100 MG tablet Take 1 tablet (100 mg total) by mouth 2 (two) times daily. 180 tablet 3  . mirtazapine (REMERON) 15 MG tablet Take 15 mg by mouth at bedtime.    [name] omeprazole (PRILOSEC) 20 MG capsule Take 20 mg by mouth daily.      . quinapril (ACCUPRIL) 40 MG tablet Take 1 tablet (40 mg total) by mouth at bedtime. 90 tablet 3  . ranitidine (ZANTAC) 300 MG tablet Take 300 mg by mouth at bedtime.      . simvastatin (ZOCOR) 20 MG tablet Take 1 tablet (20 mg total) by mouth at bedtime. 90 tablet 3  . VENTOLIN HFA 108 (90 BASE) MCG/ACT inhaler      No current facility-administered medications for this visit.    Socially , she is divorced.  She denies any recent tobacco use. She has not been able to exercise regularly.  ROS General: Negative; No fevers, chills, or night sweats; was in for fatigue. HEENT: Negative; No changes in vision or hearing, sinus congestion, difficulty swallowing Pulmonary: Negative; No cough, wheezing, shortness of breath, hemoptysis Cardiovascular: See history of present illness; positive for exertional dyspnea No chest pain, presyncope, syncope, palpitations; positive for leg GI: Positive for diverticulitis, and frequent diarrhea;  GU: Positive for renal artery stenosis; No dysuria, hematuria, or difficulty voiding Musculoskeletal: Negative; no myalgias, joint pain, or weakness Hematologic/Oncology: Negative; no easy bruising, bleeding Endocrine: Negative; no heat/cold intolerance; no diabetes Neuro: Negative; no changes in balance, headaches Skin: Negative; No rashes or skin  lesions Psychiatric: Negative; No behavioral problems, depression Sleep: Negative; No snoring, daytime sleepiness, hypersomnolence, bruxism, restless legs, hypnogognic hallucinations, no cataplexy Other comprehensive 14 point system review is negative.   PE BP 110/66 mmHg  Pulse 68  Ht 5' 1"  (1.549 m)  Wt 132 lb 1.6 oz (59.92 kg)  BMI 24.97 kg/m2   Wt Readings from Last 3 Encounters:  11/29/14 132 lb 1.6 oz (59.92 kg)  11/21/14 134 lb (60.782 kg)  04/25/14 134 lb 1.6 oz (60.827 kg)   General: Alert, oriented, no distress.  Skin: normal turgor, no rashes HEENT: Normocephalic, atraumatic. Pupils round and reactive; sclera anicteric;no lid lag.  Nose without nasal septal hypertrophy Mouth/Parynx benign; Mallinpatti scale 2 Neck: No JVD, no carotid bruits with normal carotid upstroke Chest wall: Nontender to palpation Lungs: clear to ausculatation and percussion; no wheezing or rales  Heart: RRR, s1 s2 normal 1/6 systolic murmur.  No S3 or S4 gallop.  No diastolic murmur, rubs, thrills or heaves. Abdomen: soft, nontender; no hepatosplenomehaly, BS+; abdominal aorta nontender and not dilated by palpation. Back: No CVA tenderness Pulses 2+ Extremities: Resolution of prior right ankle swelling and now only with trace left ankle swelling. no clubbing cyanosis, Homan's sign negative  Neurologic: grossly nonfocal  ECG (independently read by me): Normal sinus rhythm at 70 bpm.  Nonspecific ST changes.  PR interval 178 ms.  QTc interval 429 ms.  November 02, 2013 ECG (independently read by me): Sinus bradycardia 55 beats per minute.  Nonspecific ST-T changes.  QTc interval 430 ms.  Borderline first degree AV block with a PR interval of 202 ms.  Prior September 2014 ECG: Normal sinus rhythm at 68 beats per minute. One isolated PVC. Nonspecific ST changes.  LABS:  BMP Latest Ref Rng 08/20/2014 11/02/2013 11/09/2012  Glucose 70 - 99 mg/dL 103(H) 116(H) 97  BUN 6 - 23 mg/dL 26(H) 16 22  Creatinine  0.50 - 1.10 mg/dL 1.20(H) 1.21(H) 0.97  Sodium 135 - 145 mmol/L 133(L) 130(L) 139  Potassium 3.5 - 5.1 mmol/L 3.9 3.7 4.6  Chloride 96 - 112 mmol/L 94(L) 91(L) 105  CO2 19 - 32 mEq/L - 28 26  Calcium 8.4 - 10.5 mg/dL - 9.8 9.2   Hepatic Function Latest Ref Rng 11/02/2013 11/09/2012 11/22/2006  Total Protein 6.0 - 8.3 g/dL 7.0 6.6 6.1  Albumin 3.5 - 5.2 g/dL 4.1 4.2 2.6(L)  AST 0 - 37 U/L 18 19 51(H)  ALT 0 - 35 U/L 15 13 48(H)  Alk Phosphatase 39 - 117 U/L 66 59 83  Total Bilirubin 0.2 - 1.2 mg/dL 0.6 0.3 1.0   CBC Latest Ref Rng 08/20/2014 11/02/2013 11/09/2012  WBC 4.0 - 10.5 K/uL - 8.3 7.1  Hemoglobin 12.0 - 15.0 g/dL 13.9 13.0 11.8(L)  Hematocrit 36.0 - 46.0 % 41.0 37.8 34.7(L)  Platelets 150 - 400 K/uL - 373 257   Lab Results  Component Value Date   MCV 84.8 11/02/2013   MCV 85.0 11/09/2012   MCV 88.5 07/11/2009   Lab Results  Component Value Date   TSH 0.917 11/02/2013   Lab Results  Component Value Date   HGBA1C  07/12/2009    5.3 (NOTE) The ADA recommends the following therapeutic goal for glycemic control related to Hgb A1c measurement: Goal of therapy: <6.5 Hgb A1c  Reference: American Diabetes Association: Clinical Practice Recommendations 2010, Diabetes Care, 2010, 33: (Suppl  1).   Lipid Panel     Component Value Date/Time   CHOL 176 11/02/2013 0956   CHOL 128 11/09/2012 0805   TRIG 151* 11/02/2013 0956   TRIG 84 11/09/2012 0805   HDL 64 11/02/2013 0956   HDL 50 11/09/2012 0805   CHOLHDL 2.8 11/02/2013 0956   VLDL 30 11/02/2013 0956   LDLCALC 82 11/02/2013 0956   LDLCALC 61 11/09/2012 0805    RADIOLOGY: No results found.    ASSESSMENT AND PLAN: Tammy [name] is an 81 year old white female who is  22 years status post her CABG revascularization surgery. She has had issues with significant fatigue and mild shortness of breath with activity.  Her most recent echo Doppler study in June 2015 was not significantly change from previously.  PA pressure is still  elevated, but improved at 38 mm compared to 45 mmHg in 2013.  There was no significant regurgitant lesions noted by report.  She has a history  of hypertension and her blood pressure today is well controlled on amlodipine 5 mg, HCTZ 25 mg, metoprolol 100 mg twice a day, and quinapril 40 mg daily.  I reviewed her most recent nuclear perfusion study which was completed on 11/21/2014.  This continues to show normal perfusion without scar or ischemia and normal function.  She has a history of hyperlipidemia and has been tolerating simvastatin 20 mg.  She has right renal artery stenosis which we have been following with duplex imaging.  She is scheduled for follow-up renal Doppler study, but this has not yet been done.  I reviewed her most recent laboratory from April 2016.  Creatinine is 1.2.  I will see her in 6 months for cardiology reevaluation.  Time spent: 25 minutes   [name], MD, [facility]  11/29/2014 1:17 PM

## 2014-12-17 ENCOUNTER — Ambulatory Visit (HOSPITAL_COMMUNITY)
Admission: RE | Admit: 2014-12-17 | Discharge: 2014-12-17 | Disposition: A | Discharge status: Home / Self Care | Payer: Medicare Other | Source: Ambulatory Visit | Attending: Cardiovascular Disease | Admitting: Cardiovascular Disease

## 2014-12-17 DIAGNOSIS — I701 Atherosclerosis of renal artery: Secondary | ICD-10-CM | POA: Diagnosis present

## 2014-12-18 ENCOUNTER — Encounter: Payer: Self-pay | Admitting: *Deleted

## 2014-12-25 ENCOUNTER — Other Ambulatory Visit: Payer: Self-pay | Admitting: Cardiovascular Disease

## 2014-12-25 NOTE — Telephone Encounter (Signed)
Rx(s) sent to pharmacy electronically.  

## 2015-01-02 ENCOUNTER — Telehealth: Payer: Self-pay | Admitting: Cardiovascular Disease

## 2015-01-02 NOTE — Telephone Encounter (Signed)
Patient c/o Palpitations:  High priority if patient c/o lightheadedness and shortness of breath.  1. How long have you been having palpitations? 3-4 wks   2. Are you currently experiencing lightheadedness and shortness of breath? lightheadedness  3. Have you checked your BP and heart rate? (document readings) she didn't write it down   4. Are you experiencing any other symptoms? She denied any other symptoms

## 2015-01-02 NOTE — Telephone Encounter (Signed)
Spoke w/ patient.  She notes for ~3 weeks she has woken up feeling panicked, shaky around or after midnight.  She checks BP, notes BP and HR high. Last night was 167/108 w/ HR 90. She notes normal HR in 70s.  VS taken this AM: BP 168/70 w/ HR 71.  She thought might be panic attacks, might go away - notes never had this problem before, however.  Patient also explains just prior to 3 weeks ago she'd had a couple episodes of nightmares and had on her own stopped her Cymbalta. She did not inform PCP.  I advised informing PCP of med change - she reassures me she tapered the dose rather than stopping abruptly. I reinforced contacting PCP to apprise of med change. Informed her I would route additional concerns to Dr. Claiborne Billings.

## 2015-01-06 NOTE — Telephone Encounter (Signed)
Contacted pt. She denied any problems w/ waking up gasping for breath or related - states problem occurred before sleep typically.  Reinforced instruction to seek PCP care. Pt voiced understanding.

## 2015-01-06 NOTE — Telephone Encounter (Signed)
Agree; also make certain not any sleep apnea if  Awakening gasping for breath

## 2015-02-18 ENCOUNTER — Ambulatory Visit (INDEPENDENT_AMBULATORY_CARE_PROVIDER_SITE_OTHER): Payer: Medicare Other | Admitting: Allergy and Immunology

## 2015-02-18 ENCOUNTER — Encounter: Payer: Self-pay | Admitting: Allergy and Immunology

## 2015-02-18 VITALS — BP 114/82 | HR 68 | Resp 16 | Ht 59.0 in

## 2015-02-18 DIAGNOSIS — K219 Gastro-esophageal reflux disease without esophagitis: Secondary | ICD-10-CM

## 2015-02-18 DIAGNOSIS — J4540 Moderate persistent asthma, uncomplicated: Secondary | ICD-10-CM

## 2015-02-18 DIAGNOSIS — J387 Other diseases of larynx: Secondary | ICD-10-CM | POA: Diagnosis not present

## 2015-02-18 DIAGNOSIS — J3089 Other allergic rhinitis: Secondary | ICD-10-CM | POA: Diagnosis not present

## 2015-02-18 NOTE — Progress Notes (Signed)
[place]  Follow-up Note  Subjective  Tammy [name] is a 81 y.o. female who returns to the [facility] [place] in re-evaluation of the following:  HPI Comments: . 1. Moderate persistent asthma, uncomplicated Tammy [name] returns to this clinic noting improvement with her coughing and wheezing and SOB as a result of therapy initiated on August 6th. However, she still has dyspnea when she atempt to exert herself although she can walk at a slow pace within her house and across parking lots without too much difficulty. Her requirement for SABA has dropped to less than one time per week. She does not awaken at night with asthma symptoms. She continues on a combination of Dulera 200 and Qvar 80, two puffs twice daily for each.  2. LPRD (laryngopharyngeal reflux disease) Tammy [name] believes her reflux is doing well on her current therapy which includes omperazole 20mg  twice daily plus ranitidine 300mg  in evening.  3. Other allergic rhinitis Tammy [name] has not had any significant rhinitic symptoms while using her flonase on most days.      Current outpatient prescriptions:  .  amLODipine (NORVASC) 10 MG tablet, Take 0.5 tablets (5 mg total) by mouth daily., Disp: 45 tablet, Rfl: 3 .  aspirin 81 MG tablet, Take 81 mg by mouth daily. Take 2 tablets twice daily, Disp: , Rfl:  .  beclomethasone (QVAR) 80 MCG/ACT inhaler, Inhale 2 puffs into the lungs 2 (two) times daily.  , Disp: , Rfl:  .  donepezil (ARICEPT) 10 MG tablet, Take 1 tablet (10 mg total) by mouth daily., Disp: 90 tablet, Rfl: 1 .  etodolac (LODINE) 400 MG tablet, Take 400 mg by mouth 2 (two) times daily.  , Disp: , Rfl:  .  fluticasone (FLONASE) 50 MCG/ACT nasal spray, Place 2 sprays into the nose daily.  , Disp: , Rfl:  .  hydrochlorothiazide (HYDRODIURIL) 25 MG tablet, Take 1 tablet (25 mg total) by mouth daily., Disp: 90 tablet, Rfl: 3 .  metoprolol (LOPRESSOR) 100 MG tablet, Take  1 tablet (100 mg total) by mouth 2 (two) times daily., Disp: 180 tablet, Rfl: 3 .  mirtazapine (REMERON) 15 MG tablet, Take 15 mg by mouth at bedtime., Disp: , Rfl:  .  omeprazole (PRILOSEC) 20 MG capsule, Take 20 mg by mouth daily.  , Disp: , Rfl:  .  quinapril (ACCUPRIL) 40 MG tablet, TAKE 1 BY MOUTH AT BEDTIME, Disp: 90 tablet, Rfl: 3 .  ranitidine (ZANTAC) 300 MG tablet, Take 300 mg by mouth at bedtime.  , Disp: , Rfl:  .  simvastatin (ZOCOR) 20 MG tablet, Take 1 tablet (20 mg total) by mouth at bedtime., Disp: 90 tablet, Rfl: 3 .  VENTOLIN HFA 108 (90 BASE) MCG/ACT inhaler, , Disp: , Rfl:  .  DULoxetine (CYMBALTA) 60 MG capsule, Take 1 capsule by mouth daily., Disp: , Rfl: 2 .  quinapril (ACCUPRIL) 40 MG tablet, Take 1 tablet (40 mg total) by mouth at bedtime. (Patient not taking: Reported on 02/18/2015), Disp: 90 tablet, Rfl: 3  No orders of the defined types were placed in this encounter.    Allergies  Allergen Reactions  . Penicillins Rash    Review of Systems  Constitutional: Negative.   HENT: Negative.   Eyes: Negative.   Respiratory: Negative.   Cardiovascular:       Dyspnea on exertion  Gastrointestinal: Negative.   Skin: Negative.      Objective:   Filed Vitals:  02/18/15 1659  BP: 114/82  Pulse: 68  Resp: 16    Physical Exam  Constitutional: She is oriented to person, place, and time and well-developed, well-nourished, and in no distress. No distress.  HENT:  Right Ear: External ear normal.  Left Ear: External ear normal.  Mouth/Throat: Oropharynx is clear and moist.  Eyes: Conjunctivae are normal.  Neck: No JVD present. No tracheal deviation present. No thyromegaly present.  Cardiovascular: Normal rate, regular rhythm and normal heart sounds.  Exam reveals no gallop.   No murmur heard. Pulmonary/Chest: No stridor. No respiratory distress. She has no wheezes. She has no rales. She exhibits no tenderness.  Musculoskeletal: She exhibits no edema.   Lymphadenopathy:    She has no cervical adenopathy.  Neurological: She is alert and oriented to person, place, and time.  Skin: No rash noted. She is not diaphoretic. No erythema. No pallor.    Diagnostics:    Spirometry was performed and demonstrated an FEV1 of 1.03 at 65 % of predicted.  Blood test results from 08 January 2015 identified a eosinophil count of 200 and an IgE level of 89 IU/ML  Assessment and Plan:   1. Moderate persistent asthma, uncomplicated   2. LPRD (laryngopharyngeal reflux disease)   3. Other allergic rhinitis         1. Continue:   A. Dulera 200 two puffs two times per day  B. Qvar 80 two puffs two times per day  C. Omeprazole 20mg  one tablet two times per day  D. Ranitidine 300 one time per day - PM  E. flonase one spray each nostril one time per day  2. If Needed;   A. Ventolin HFA  B. Cetirizine   3. Get flu vaccine  4. Submit for Xolair approval  5. Return in 8 weeks  I will submit Tammy [name] for omalizumab approval in the hope of getting better control of her asthma while decreasing her medication requirement. Her respiratory symptoms are most likely a collection of various heart and lung issues, including pulmonary hypertension and asthma and we will give her a 6 month trial of omalizumab to see if this asthma directed therapy help her symptoms. If there is no change at that point then we will d/c this biological agent.    Allena Katz, MD [place]

## 2015-02-18 NOTE — Patient Instructions (Addendum)
  1. Continue:   A. Dulera 200 two puffs two times per day  B. Qvar 80 two puffs two times per day  C. Omeprazole 20mg  one tablet two times per day  D. Ranitidine 300 one time per day - PM  E. flonase one spray each nostril one time per day  2. If Needed;   A. Ventolin HFA  B. Cetirizine   3. Get flu vaccine  4. Submit for Xolair approval  5. Return in 8 weeks

## 2015-04-25 ENCOUNTER — Other Ambulatory Visit: Payer: Self-pay | Admitting: Allergy and Immunology

## 2015-07-18 ENCOUNTER — Other Ambulatory Visit: Payer: Self-pay | Admitting: Allergy and Immunology

## 2015-07-18 MED ORDER — FLUTICASONE PROPIONATE 50 MCG/ACT NA SUSP: NASAL | Status: DC

## 2015-07-18 MED ORDER — BECLOMETHASONE DIPROPIONATE 80 MCG/ACT IN AERS: 2.0000 | INHALATION_SPRAY | Freq: Two times a day (BID) | RESPIRATORY_TRACT | Status: DC

## 2015-07-18 NOTE — Telephone Encounter (Signed)
Pt called and needs rx called into a new pharmacy qvar 80 and flonase to [place]. Pt number [phone].

## 2015-07-21 ENCOUNTER — Telehealth: Payer: Self-pay | Admitting: Cardiovascular Disease

## 2015-07-21 MED ORDER — AMLODIPINE BESYLATE 10 MG PO TABS: 5.0000 mg | ORAL_TABLET | Freq: Every day | ORAL | Status: DC

## 2015-07-21 MED ORDER — HYDROCHLOROTHIAZIDE 25 MG PO TABS: 25.0000 mg | ORAL_TABLET | Freq: Every day | ORAL | Status: DC

## 2015-07-21 MED ORDER — METOPROLOL TARTRATE 100 MG PO TABS: 100.0000 mg | ORAL_TABLET | Freq: Two times a day (BID) | ORAL | Status: DC

## 2015-07-21 MED ORDER — OMEPRAZOLE 20 MG PO CPDR: 20.0000 mg | DELAYED_RELEASE_CAPSULE | Freq: Every day | ORAL | Status: DC

## 2015-07-21 MED ORDER — SIMVASTATIN 20 MG PO TABS: 20.0000 mg | ORAL_TABLET | Freq: Every day | ORAL | Status: DC

## 2015-07-21 MED ORDER — QUINAPRIL HCL 40 MG PO TABS: 40.0000 mg | ORAL_TABLET | Freq: Every day | ORAL | Status: DC

## 2015-07-21 MED ORDER — RANITIDINE HCL 300 MG PO TABS: 300.0000 mg | ORAL_TABLET | Freq: Every day | ORAL | Status: DC

## 2015-07-21 NOTE — Telephone Encounter (Signed)
New Message  Pt requesting to speka w/ RN concerning her meds- could not remember name. Please call back and discuss.

## 2015-07-21 NOTE — Telephone Encounter (Signed)
Pt called, needed all cardiac medications refilled to local pharmacy - confirmed she uses [facility] [address] sent. Pt also set up for return appt w/ Dr. Claiborne Billings. Advised to call if further concerns.

## 2015-08-05 ENCOUNTER — Ambulatory Visit (INDEPENDENT_AMBULATORY_CARE_PROVIDER_SITE_OTHER): Payer: Medicare HMO | Admitting: Allergy and Immunology

## 2015-08-05 VITALS — BP 118/70 | HR 84 | Resp 16 | Ht 60.0 in | Wt 132.0 lb

## 2015-08-05 DIAGNOSIS — K219 Gastro-esophageal reflux disease without esophagitis: Secondary | ICD-10-CM

## 2015-08-05 DIAGNOSIS — J387 Other diseases of larynx: Secondary | ICD-10-CM | POA: Diagnosis not present

## 2015-08-05 DIAGNOSIS — J4540 Moderate persistent asthma, uncomplicated: Secondary | ICD-10-CM | POA: Diagnosis not present

## 2015-08-05 DIAGNOSIS — J3089 Other allergic rhinitis: Secondary | ICD-10-CM | POA: Diagnosis not present

## 2015-08-05 MED ORDER — OMEPRAZOLE 20 MG PO CPDR
20.0000 mg | DELAYED_RELEASE_CAPSULE | Freq: Two times a day (BID) | ORAL | Status: DC
Start: 2015-08-05 — End: 2016-06-17

## 2015-08-05 MED ORDER — FLUTICASONE FUROATE-VILANTEROL 200-25 MCG/INH IN AEPB: 1.0000 | INHALATION_SPRAY | Freq: Every day | RESPIRATORY_TRACT | Status: DC

## 2015-08-05 NOTE — Progress Notes (Signed)
Follow-up Note  Referring Provider: Harle Battiest, MD Primary Provider: Harle Battiest, MD Date of Office Visit: 08/05/2015  Subjective:   Tammy [name] (DOB: 31-May-1930) is a 82 y.o. female who returns to the [facility] on 08/05/2015 in re-evaluation of the following:  HPI Comments: Tammy [name] returns to this clinic in reevaluation of her respiratory tract issues. She has a combination of various heart and lung issues including pulmonary hypertension and asthma that has been somewhat active over the course of the past several years especially this past winter. She unfortunately cannot afford her medications because of the expense involved with these inhalers and she's only using them on a intermittent basis. At this point she is doing relatively well regarding her breathing and does not need to use any short acting bronchodilator. She does not really exercise any large degree. We tried to get her on Xolair but for some logistical issue this was unsuccessful.  One of Tammy [name]'s big problems recently is the fact that she's had chronic diarrhea that is really gotten out of control. She is seen Dr. Amedeo Plenty in the past for this issue has performed a colonoscopy and told her that she had diverticulitis. She uses Imodium on a daily basis. She scheduled to see [facility] GI department this week to further investigate this issue. It does not sound as though she's ever tried colestipol.  Her nose is been doing quite well on her current medical therapy. She does not believe that she's had much problems with either reflux or problems with her throat while consistently using treatment directed against reflux.     Medication List       This list is accurate as of: 08/05/15  3:41 PM.  Always use your most recent med list.               amLODipine 10 MG tablet  Commonly known as:  NORVASC  Take 0.5 tablets (5 mg total) by mouth daily.     aspirin 81 MG tablet  Take  81 mg by mouth daily. Take 2 tablets twice daily     beclomethasone 80 MCG/ACT inhaler  Commonly known as:  QVAR  Inhale 2 puffs into the lungs 2 (two) times daily.     donepezil 10 MG tablet  Commonly known as:  ARICEPT  Take 1 tablet (10 mg total) by mouth daily.     DULoxetine 60 MG capsule  Commonly known as:  CYMBALTA  Take 1 capsule by mouth daily. Reported on 08/05/2015     etodolac 400 MG tablet  Commonly known as:  LODINE  Take 400 mg by mouth 2 (two) times daily.     fluticasone 50 MCG/ACT nasal spray  Commonly known as:  FLONASE  USE 1 TO 2 SPRAYS IN EACH NOSTRIL ONCE DAILY FOR STUFFY NOSE OR DRAINAGE     hydrochlorothiazide 25 MG tablet  Commonly known as:  HYDRODIURIL  Take 1 tablet (25 mg total) by mouth daily.     metoprolol 100 MG tablet  Commonly known as:  LOPRESSOR  Take 1 tablet (100 mg total) by mouth 2 (two) times daily.     mirtazapine 15 MG tablet  Commonly known as:  REMERON  Take 15 mg by mouth at bedtime. Reported on 08/05/2015     mometasone-formoterol 100-5 MCG/ACT Aero  Commonly known as:  DULERA  Inhale 2 puffs into the lungs 2 (two) times daily.     omeprazole 20 MG  capsule  Commonly known as:  PRILOSEC  Take 1 capsule (20 mg total) by mouth daily.     quinapril 40 MG tablet  Commonly known as:  ACCUPRIL  Take 1 tablet (40 mg total) by mouth daily.     ranitidine 300 MG tablet  Commonly known as:  ZANTAC  Take 1 tablet (300 mg total) by mouth at bedtime.     simvastatin 20 MG tablet  Commonly known as:  ZOCOR  Take 1 tablet (20 mg total) by mouth at bedtime.     VENTOLIN HFA 108 (90 Base) MCG/ACT inhaler  Generic drug:  albuterol        Past Medical History  Diagnosis Date  . Hypertension   . Psoriasis   . High cholesterol   . Acid reflux   . Hyperlipidemia   . Asthma     Asthmatic COPD  . GERD (gastroesophageal reflux disease)   . Coronary artery disease     Echo 07/27/2011   . Coronary artery stenosis     ,  High-grade left main    Past Surgical History  Procedure Laterality Date  . Cardiac surgery    . Eye surgery    . Rotator cuff repair      bilateral  . Coronary artery bypass graft  1994    After catheterization showed 90% ostial left main stenosis  . Doppler echocardiography      Study showed mild to moderate MR with moderate TR, mild to moderate pulmonary hypertension with an ejection fraction greater that 55%.   . Nuclear perfusion  10/2010, 11/15/2012    Showed normal perfusion, No Ischemia or Infarction, Normal EF  . 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 Right proximal 275 and Mid 152.  . Sleep study  12/28/2006    AHI during total sleep time (3h 19 minutes) was 1.81/hr and during REM sleep at 17.78/hr. Mild sleep apnea during REM sleep.Oxygen staturated rate during REM and NREM was 93.0%.    Allergies  Allergen Reactions  . Penicillins Rash    Review of systems negative except as noted in HPI / PMHx or noted below:  Review of Systems  Constitutional: Negative.   HENT: Negative.   Eyes: Negative.   Respiratory: Negative.   Cardiovascular: Negative.   Gastrointestinal: Negative.   Genitourinary: Negative.   Musculoskeletal: Negative.   Skin: Negative.   Neurological: Negative.   Endo/Heme/Allergies: Negative.   Psychiatric/Behavioral: Negative.      Objective:   Filed Vitals:   08/05/15 1515  BP: 118/70  Pulse: 84  Resp: 16   Height: 5' (152.4 cm)  Weight: 132 lb (59.875 kg)   Physical Exam  Constitutional: She is well-developed, well-nourished, and in no distress.  Raspy voice  HENT:  Head: Normocephalic.  Right Ear: Tympanic membrane, external ear and ear canal normal.  Left Ear: Tympanic membrane, external ear and ear canal normal.  Nose: Nose normal. No mucosal edema or rhinorrhea.  Mouth/Throat: Uvula is midline, oropharynx is clear and moist and mucous membranes are normal. No  oropharyngeal exudate.  Eyes: Conjunctivae are normal.  Neck: Trachea normal. No tracheal tenderness present. No tracheal deviation present. No thyromegaly present.  Cardiovascular: Normal rate, regular rhythm, S1 normal, S2 normal and normal heart sounds.   No murmur heard. Pulmonary/Chest: Breath sounds normal. No stridor. No respiratory distress. She has no wheezes. She has no rales.  Musculoskeletal: She exhibits no  edema.  Lymphadenopathy:       Head (right side): No tonsillar adenopathy present.       Head (left side): No tonsillar adenopathy present.    She has no cervical adenopathy.    She has no axillary adenopathy.  Neurological: She is alert. Gait normal.  Skin: No rash noted. She is not diaphoretic. No erythema. Nails show no clubbing.  Psychiatric: Mood and affect normal.    Diagnostics:    Spirometry was performed and demonstrated an FEV1 of 0.75 at 46 % of predicted.  The patient had an Asthma Control Test with the following results:  .    Assessment and Plan:   1. Moderate persistent asthma, uncomplicated   2. LPRD (laryngopharyngeal reflux disease)   3. Other allergic rhinitis      1. Use the following every day:   A. Breo 200 one inhalation one time per day (No Ovar or Dulera)  B. Omeprazole 20mg  one tablet two times per day  C. Ranitidine 300 one time per day - PM  D. flonase one spray each nostril one time per day  2. If Needed;   A. Ventolin HFA  B. Cetirizine  3. Submit for Xolair approval  4. Colestipol? Ask GI doctor during [facility] visit  5. Return in 8 weeks  I will attempt to get Eveleigh on Xolair in the hope of minimizing her respiratory tract symptoms and minimizing the use of her medications which basically she just cannot afford. I'll see her back in his clinic in 8 weeks. Hopefully by that time we can work through the logistics of getting Xolair approved. She'll contact me during the interval should there be a significant problem.  Allena Katz, MD [place]

## 2015-08-05 NOTE — Patient Instructions (Addendum)
  1. Use the following every day:   A. Breo 200 one inhalation one time per day (No Qvar or Dulera)  B. Omeprazole 20mg  one tablet two times per day  C. Ranitidine 300 one time per day - PM  D. flonase one spray each nostril one time per day  2. If Needed;   A. Ventolin HFA  B. Cetirizine  3. Submit for Xolair approval  4. Colestipol? Ask GI doctor during [facility] visit  5. Return in 8 weeks

## 2015-08-06 ENCOUNTER — Encounter: Payer: Self-pay | Admitting: Allergy and Immunology

## 2015-08-07 DIAGNOSIS — R159 Full incontinence of feces: Secondary | ICD-10-CM | POA: Insufficient documentation

## 2015-08-07 DIAGNOSIS — K529 Noninfective gastroenteritis and colitis, unspecified: Secondary | ICD-10-CM | POA: Insufficient documentation

## 2015-08-07 DIAGNOSIS — R152 Fecal urgency: Secondary | ICD-10-CM | POA: Insufficient documentation

## 2015-08-07 DIAGNOSIS — Z800 Family history of malignant neoplasm of digestive organs: Secondary | ICD-10-CM | POA: Insufficient documentation

## 2015-09-10 ENCOUNTER — Ambulatory Visit (INDEPENDENT_AMBULATORY_CARE_PROVIDER_SITE_OTHER): Payer: Medicare HMO | Admitting: Cardiovascular Disease

## 2015-09-10 ENCOUNTER — Encounter: Payer: Self-pay | Admitting: Cardiovascular Disease

## 2015-09-10 VITALS — BP 118/62 | HR 64 | Ht 60.0 in | Wt 141.1 lb

## 2015-09-10 DIAGNOSIS — I272 Other secondary pulmonary hypertension: Secondary | ICD-10-CM

## 2015-09-10 DIAGNOSIS — I10 Essential (primary) hypertension: Secondary | ICD-10-CM | POA: Diagnosis not present

## 2015-09-10 DIAGNOSIS — I2583 Coronary atherosclerosis due to lipid rich plaque: Secondary | ICD-10-CM

## 2015-09-10 DIAGNOSIS — I2510 Atherosclerotic heart disease of native coronary artery without angina pectoris: Secondary | ICD-10-CM | POA: Diagnosis not present

## 2015-09-10 DIAGNOSIS — E785 Hyperlipidemia, unspecified: Secondary | ICD-10-CM

## 2015-09-10 DIAGNOSIS — R0609 Other forms of dyspnea: Secondary | ICD-10-CM

## 2015-09-10 DIAGNOSIS — I340 Nonrheumatic mitral (valve) insufficiency: Secondary | ICD-10-CM | POA: Diagnosis not present

## 2015-09-10 DIAGNOSIS — R0600 Dyspnea, unspecified: Secondary | ICD-10-CM

## 2015-09-10 MED ORDER — FUROSEMIDE 20 MG PO TABS: 20.0000 mg | ORAL_TABLET | Freq: Every day | ORAL | Status: DC

## 2015-09-10 NOTE — Patient Instructions (Signed)
Your physician has requested that you have an echocardiogram. Echocardiography is a painless test that uses sound waves to create images of your heart. It provides your doctor with information about the size and shape of your heart and how well your heart's chambers and valves are working. This procedure takes approximately one hour. There are no restrictions for this procedure.   Your physician recommends that you return for lab work in: 2 weeks.  Your physician has recommended you make the following change in your medication:   1.) STOP the HCTZ.  2.) start new furosemide prescription.  Your physician recommends that you schedule a follow-up appointment in: 3 months.

## 2015-09-11 ENCOUNTER — Encounter: Payer: Self-pay | Admitting: Cardiovascular Disease

## 2015-09-11 DIAGNOSIS — R0600 Dyspnea, unspecified: Secondary | ICD-10-CM | POA: Insufficient documentation

## 2015-09-11 DIAGNOSIS — R0609 Other forms of dyspnea: Secondary | ICD-10-CM | POA: Insufficient documentation

## 2015-09-11 NOTE — Progress Notes (Signed)
Patient ID: Tammy [name], female   DOB: July 20, 1930, 82 y.o.   MRN: [id number]     Primary M.D.: Gus Height  HPI: Tammy [name] is a 82 y.o. female who presents to the office today for a 7 month followup cardiology evaluation.   Tammy [name] has established CAD andunderwent CABG surgery in 1994 after she was found to have high-grade left main stenosis. A nuclear perfusion study in June 2011 showed normal perfusion. An echo Doppler study in March 2013 showed mild-to-moderate mitral regurgitation, moderate tricuspid regurgitation, mild-to-moderate pulmonary hypertension with estimated RV systolic pressure 45 mm, mild to moderate LA dilatation, aortic sclerosis/calcification, and an ejection fraction greater than 55%. She has documented right renal artery stenosis on renal duplex scanning with the last scan in November 2013 suggesting a equal or greater than 60% diameter reduction by velocity in the right renal artery. The kidneys are otherwise normal in size, symmetrical in shape and had normal cortex and medulla. She does have a history of  hypertension and hyperlipidemia. In the past she also has had some issues with cellulitis of her lower extremity.   A two-year followup nuclear perfusion study on 11/16/2011; no significant EKG changes were demonstrated. The study was low risk and did not show any area of scar or infarction. This is unchanged from 2 years previously.  She has had issues with lower extremity edema and this improved with reduction of her amlodipine dose to 5 mg from 10 mg. A follow-up echo Doppler study continued to show normal systolic function.  There was mild left atrial dilatation and mild pulmonary hypertension with a PA pressure slightly improved from previously at 38 mm.  She did not have significant valvular abnormality.  A follow-up renal duplex study was unchanged from her prior study of one year previously and showed equal to less than 6% diameter reduction in the right renal  artery.  She had normal patency to the left renal artery.  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   She underwent a follow-up nuclear perfusion study in July 2016.  This was normal without scar or ischemia.  Ejection fraction was 74% and she had normal wall motion.  She did not develop any ECG changes.  Presently, she denies any episodes of chest pain.  She has a history of asthma.  She has noticed more shortness of breath and has been having more difficulty with leg swelling, particularly left greater than right despite taking hydrochlorothiazide 25 mg daily.  She has been taking Lodine 43 disc.  She denies palpitations.  She states lab work was recently done by Dr. Harrington Challenger.  She presents for follow-up evaluation.  Past Medical History  Diagnosis Date  . Hypertension   . Psoriasis   . High cholesterol   . Acid reflux   . Hyperlipidemia   . Asthma     Asthmatic COPD  . GERD (gastroesophageal reflux disease)   . Coronary artery disease     Echo 07/27/2011   . Coronary artery stenosis     , High-grade left main    Past Surgical History  Procedure Laterality Date  . Cardiac surgery    . Eye surgery    . Rotator cuff repair      bilateral  . Coronary artery bypass graft  1994    After catheterization showed 90% ostial left main stenosis  . Doppler echocardiography      Study showed mild to moderate MR with moderate  TR, mild to moderate pulmonary hypertension with an ejection fraction greater that 55%.   . Nuclear perfusion  10/2010, 11/15/2012    Showed normal perfusion, No Ischemia or Infarction, Normal EF  . 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 Right proximal 275 and Mid 152.  . Sleep study  12/28/2006    AHI during total sleep time (3h 19 minutes) was 1.81/hr and during REM sleep at 17.78/hr. Mild sleep apnea during REM sleep.Oxygen staturated rate during REM and NREM was 93.0%.     Allergies  Allergen Reactions  . Penicillins Rash and Hives    Current Outpatient Prescriptions  Medication Sig Dispense Refill  . amLODipine (NORVASC) 10 MG tablet Take 0.5 tablets (5 mg total) by mouth daily. 45 tablet 3  . aspirin 81 MG tablet Take 81 mg by mouth daily. Take 2 tablets twice daily    . donepezil (ARICEPT) 10 MG tablet Take 1 tablet (10 mg total) by mouth daily. 90 tablet 1  . DULoxetine (CYMBALTA) 60 MG capsule Take 1 capsule by mouth daily. Reported on 08/05/2015  2  . etodolac (LODINE) 400 MG tablet Take 400 mg by mouth 2 (two) times daily.      . fluticasone (FLONASE) 50 MCG/ACT nasal spray USE 1 TO 2 SPRAYS IN EACH NOSTRIL ONCE DAILY FOR STUFFY NOSE OR DRAINAGE 16 g 2  . metoprolol (LOPRESSOR) 100 MG tablet Take 1 tablet (100 mg total) by mouth 2 (two) times daily. 180 tablet 3  . mirtazapine (REMERON) 15 MG tablet Take 15 mg by mouth at bedtime. Reported on 08/05/2015    . mometasone-formoterol (DULERA) 100-5 MCG/ACT AERO Inhale 2 puffs into the lungs 2 (two) times daily.    [name] omeprazole (PRILOSEC) 20 MG capsule Take 1 capsule (20 mg total) by mouth 2 (two) times daily before a meal. 60 capsule 5  . quinapril (ACCUPRIL) 40 MG tablet Take 1 tablet (40 mg total) by mouth daily. 90 tablet 3  . ranitidine (ZANTAC) 300 MG tablet Take 1 tablet (300 mg total) by mouth at bedtime. 90 tablet 3  . simvastatin (ZOCOR) 20 MG tablet Take 1 tablet (20 mg total) by mouth at bedtime. 90 tablet 3  . VENTOLIN HFA 108 (90 BASE) MCG/ACT inhaler     . furosemide (LASIX) 20 MG tablet Take 1 tablet (20 mg total) by mouth daily. 30 tablet 6   No current facility-administered medications for this visit.    Socially , she is divorced.  She denies any recent tobacco use. She has not been able to exercise regularly.  ROS General: Negative; No fevers, chills, or night sweats; was in for fatigue. HEENT: Negative; No changes in vision or hearing, sinus congestion, difficulty  swallowing Pulmonary: Negative; No cough, wheezing, shortness of breath, hemoptysis Cardiovascular: See history of present illness; positive for leg edema GI: Positive for diverticulitis, and frequent diarrhea;  GU: Positive for renal artery stenosis; No dysuria, hematuria, or difficulty voiding Musculoskeletal: Negative; no myalgias, joint pain, or weakness Hematologic/Oncology: Negative; no easy bruising, bleeding Endocrine: Negative; no heat/cold intolerance; no diabetes Neuro: Negative; no changes in balance, headaches Skin: Negative; No rashes or skin lesions Psychiatric: Negative; No behavioral problems, depression Sleep: Negative; No snoring, daytime sleepiness, hypersomnolence, bruxism, restless legs, hypnogognic hallucinations, no cataplexy Other comprehensive 14 point system review is negative.   PE BP 118/62 mmHg  Pulse 64  Ht 5' (1.524 m)  Wt 141 lb  1 oz (63.986 kg)  BMI 27.55 kg/m2   Wt Readings from Last 3 Encounters:  09/10/15 141 lb 1 oz (63.986 kg)  08/05/15 132 lb (59.875 kg)  11/29/14 132 lb 1.6 oz (59.92 kg)   General: Alert, oriented, no distress.  Skin: normal turgor, no rashes HEENT: Normocephalic, atraumatic. Pupils round and reactive; sclera anicteric;no lid lag.  Nose without nasal septal hypertrophy Mouth/Parynx benign; Mallinpatti scale 2 Neck: No JVD, no carotid bruits with normal carotid upstroke Chest wall: Nontender to palpation Lungs: clear to ausculatation and percussion; no wheezing or rales Heart: RRR, s1 s2 normal 1/6 systolic murmur.  No S3 or S4 gallop.  No diastolic murmur, rubs, thrills or heaves. Abdomen: soft, nontender; no hepatosplenomehaly, BS+; abdominal aorta nontender and not dilated by palpation. Back: No CVA tenderness Pulses 2+ Extremities: Bilateral lower extremity swelling, left leg greater than right.. no clubbing cyanosis, Homan's sign negative  Neurologic: grossly nonfocal  ECG (independently read by me): Normal sinus  rhythm at 68 bpm.  Normal intervals.  No ST segment changes.  July 2016 ECG (independently read by me): Normal sinus rhythm at 70 bpm.  Nonspecific ST changes.  PR interval 178 ms.  QTc interval 429 ms.  November 02, 2013 ECG (independently read by me): Sinus bradycardia 55 beats per minute.  Nonspecific ST-T changes.  QTc interval 430 ms.  Borderline first degree AV block with a PR interval of 202 ms.  Prior September 2014 ECG: Normal sinus rhythm at 68 beats per minute. One isolated PVC. Nonspecific ST changes.  LABS:  BMP Latest Ref Rng 08/20/2014 11/02/2013 11/09/2012  Glucose 70 - 99 mg/dL 103(H) 116(H) 97  BUN 6 - 23 mg/dL 26(H) 16 22  Creatinine 0.50 - 1.10 mg/dL 1.20(H) 1.21(H) 0.97  Sodium 135 - 145 mmol/L 133(L) 130(L) 139  Potassium 3.5 - 5.1 mmol/L 3.9 3.7 4.6  Chloride 96 - 112 mmol/L 94(L) 91(L) 105  CO2 19 - 32 mEq/L - 28 26  Calcium 8.4 - 10.5 mg/dL - 9.8 9.2   Hepatic Function Latest Ref Rng 11/02/2013 11/09/2012 11/22/2006  Total Protein 6.0 - 8.3 g/dL 7.0 6.6 6.1  Albumin 3.5 - 5.2 g/dL 4.1 4.2 2.6(L)  AST 0 - 37 U/L 18 19 51(H)  ALT 0 - 35 U/L 15 13 48(H)  Alk Phosphatase 39 - 117 U/L 66 59 83  Total Bilirubin 0.2 - 1.2 mg/dL 0.6 0.3 1.0   CBC Latest Ref Rng 08/20/2014 11/02/2013 11/09/2012  WBC 4.0 - 10.5 K/uL - 8.3 7.1  Hemoglobin 12.0 - 15.0 g/dL 13.9 13.0 11.8(L)  Hematocrit 36.0 - 46.0 % 41.0 37.8 34.7(L)  Platelets 150 - 400 K/uL - 373 257   Lab Results  Component Value Date   MCV 84.8 11/02/2013   MCV 85.0 11/09/2012   MCV 88.5 07/11/2009   Lab Results  Component Value Date   TSH 0.917 11/02/2013   Lab Results  Component Value Date   HGBA1C  07/12/2009    5.3 (NOTE) The ADA recommends the following therapeutic goal for glycemic control related to Hgb A1c measurement: Goal of therapy: <6.5 Hgb A1c  Reference: American Diabetes Association: Clinical Practice Recommendations 2010, Diabetes Care, 2010, 33: (Suppl  1).   Lipid Panel     Component Value  Date/Time   CHOL 176 11/02/2013 0956   CHOL 128 11/09/2012 0805   TRIG 151* 11/02/2013 0956   TRIG 84 11/09/2012 0805   HDL 64 11/02/2013 0956   HDL 50 11/09/2012 0805  CHOLHDL 2.8 11/02/2013 0956   VLDL 30 11/02/2013 0956   LDLCALC 82 11/02/2013 0956   LDLCALC 61 11/09/2012 0805    RADIOLOGY: No results found.    ASSESSMENT AND PLAN: Tammy [name] is an 82 year old white female who is  23 years status post her CABG revascularization surgery in 1994. She has had issues with significant fatigue and mild shortness of breath with activity.  Her last echo Doppler study in June 2015 was not significantly change from previously.  PA pressure is still elevated, but improved at 38 mm compared to 45 mmHg in 2013.  Her last nuclear perfusion study in July 2016 continues to show normal perfusion without scar or ischemia.  She's not having anginal symptoms.  Her blood pressure today is controlled on her regimen consisting of amlodipine 5 mg, HCTZ 25 mg, metoprolol 100 mg twice a day, quinapril 40 mg daily.  However, she continues to have significant leg swelling despite HCTZ.  I have recommended that she discontinue HC TZ and will start furosemide initially at 20 mg.  I will try to obtain the blood work that had been recently done by Dr. Harrington Challenger.  I will recheck a be met in 2 weeks following change to furosemide.  She has a history of hyperlipidemia and continues to be on simvastatin 20 mg.  She denies myalgias.  There is GERD which is controlled with ranitidine.  I have suggested she try reducing her lower 9 and only take as an as-needed basis.  She has noticed more shortness of breath.  I have suggested a follow-up echo Doppler evaluation.  I will see her in 3 months for reevaluation. Time spent: 25 minutes   [name], MD, [facility]  09/11/2015 1:00 PM

## 2015-10-01 ENCOUNTER — Other Ambulatory Visit: Payer: Self-pay

## 2015-10-01 ENCOUNTER — Other Ambulatory Visit: Payer: Medicare HMO

## 2015-10-01 ENCOUNTER — Ambulatory Visit (HOSPITAL_COMMUNITY): Payer: Medicare HMO | Attending: Cardiology

## 2015-10-01 DIAGNOSIS — I10 Essential (primary) hypertension: Secondary | ICD-10-CM | POA: Diagnosis not present

## 2015-10-01 DIAGNOSIS — E785 Hyperlipidemia, unspecified: Secondary | ICD-10-CM | POA: Insufficient documentation

## 2015-10-01 DIAGNOSIS — I2510 Atherosclerotic heart disease of native coronary artery without angina pectoris: Secondary | ICD-10-CM | POA: Diagnosis not present

## 2015-10-02 LAB — BASIC METABOLIC PANEL
BUN: 26 mg/dL — ABNORMAL HIGH (ref 7–25)
CO2: 21 mmol/L (ref 20–31)
Calcium: 9 mg/dL (ref 8.6–10.4)
Chloride: 104 mmol/L (ref 98–110)
Creat: 1.11 mg/dL — ABNORMAL HIGH (ref 0.60–0.88)
Glucose, Bld: 93 mg/dL (ref 65–99)
Potassium: 4.6 mmol/L (ref 3.5–5.3)
Sodium: 141 mmol/L (ref 135–146)

## 2015-10-07 ENCOUNTER — Ambulatory Visit: Payer: Medicare HMO | Admitting: Allergy and Immunology

## 2015-12-22 ENCOUNTER — Ambulatory Visit: Payer: Medicare HMO | Admitting: Cardiovascular Disease

## 2015-12-31 ENCOUNTER — Inpatient Hospital Stay (HOSPITAL_COMMUNITY)
Admission: EM | Admit: 2015-12-31 | Discharge: 2016-01-06 | DRG: 309 | Disposition: A | Discharge status: Skilled Nursing Facility | Payer: Medicare HMO | Attending: Internal Medicine | Admitting: Internal Medicine

## 2015-12-31 ENCOUNTER — Emergency Department (HOSPITAL_COMMUNITY): Payer: Medicare HMO

## 2015-12-31 ENCOUNTER — Encounter (HOSPITAL_COMMUNITY): Payer: Self-pay

## 2015-12-31 DIAGNOSIS — R413 Other amnesia: Secondary | ICD-10-CM | POA: Diagnosis present

## 2015-12-31 DIAGNOSIS — I480 Paroxysmal atrial fibrillation: Secondary | ICD-10-CM | POA: Diagnosis not present

## 2015-12-31 DIAGNOSIS — Z79899 Other long term (current) drug therapy: Secondary | ICD-10-CM

## 2015-12-31 DIAGNOSIS — L409 Psoriasis, unspecified: Secondary | ICD-10-CM | POA: Diagnosis present

## 2015-12-31 DIAGNOSIS — I2510 Atherosclerotic heart disease of native coronary artery without angina pectoris: Secondary | ICD-10-CM | POA: Diagnosis present

## 2015-12-31 DIAGNOSIS — Z9181 History of falling: Secondary | ICD-10-CM

## 2015-12-31 DIAGNOSIS — Y92009 Unspecified place in unspecified non-institutional (private) residence as the place of occurrence of the external cause: Secondary | ICD-10-CM | POA: Diagnosis present

## 2015-12-31 DIAGNOSIS — E860 Dehydration: Secondary | ICD-10-CM

## 2015-12-31 DIAGNOSIS — I959 Hypotension, unspecified: Secondary | ICD-10-CM | POA: Diagnosis not present

## 2015-12-31 DIAGNOSIS — J449 Chronic obstructive pulmonary disease, unspecified: Secondary | ICD-10-CM | POA: Diagnosis present

## 2015-12-31 DIAGNOSIS — Z8249 Family history of ischemic heart disease and other diseases of the circulatory system: Secondary | ICD-10-CM

## 2015-12-31 DIAGNOSIS — W19XXXA Unspecified fall, initial encounter: Secondary | ICD-10-CM | POA: Diagnosis present

## 2015-12-31 DIAGNOSIS — I10 Essential (primary) hypertension: Secondary | ICD-10-CM | POA: Diagnosis present

## 2015-12-31 DIAGNOSIS — F0390 Unspecified dementia without behavioral disturbance: Secondary | ICD-10-CM | POA: Diagnosis present

## 2015-12-31 DIAGNOSIS — I272 Other secondary pulmonary hypertension: Secondary | ICD-10-CM | POA: Diagnosis present

## 2015-12-31 DIAGNOSIS — K219 Gastro-esophageal reflux disease without esophagitis: Secondary | ICD-10-CM | POA: Diagnosis present

## 2015-12-31 DIAGNOSIS — Z880 Allergy status to penicillin: Secondary | ICD-10-CM

## 2015-12-31 DIAGNOSIS — R410 Disorientation, unspecified: Secondary | ICD-10-CM | POA: Diagnosis present

## 2015-12-31 DIAGNOSIS — F10239 Alcohol dependence with withdrawal, unspecified: Secondary | ICD-10-CM | POA: Diagnosis present

## 2015-12-31 DIAGNOSIS — I4891 Unspecified atrial fibrillation: Secondary | ICD-10-CM | POA: Diagnosis present

## 2015-12-31 DIAGNOSIS — R159 Full incontinence of feces: Secondary | ICD-10-CM | POA: Diagnosis present

## 2015-12-31 DIAGNOSIS — Z87891 Personal history of nicotine dependence: Secondary | ICD-10-CM

## 2015-12-31 DIAGNOSIS — N179 Acute kidney failure, unspecified: Secondary | ICD-10-CM

## 2015-12-31 DIAGNOSIS — E785 Hyperlipidemia, unspecified: Secondary | ICD-10-CM | POA: Diagnosis present

## 2015-12-31 DIAGNOSIS — R296 Repeated falls: Secondary | ICD-10-CM | POA: Diagnosis present

## 2015-12-31 DIAGNOSIS — Z7982 Long term (current) use of aspirin: Secondary | ICD-10-CM

## 2015-12-31 DIAGNOSIS — Z7951 Long term (current) use of inhaled steroids: Secondary | ICD-10-CM

## 2015-12-31 DIAGNOSIS — G4730 Sleep apnea, unspecified: Secondary | ICD-10-CM | POA: Diagnosis present

## 2015-12-31 DIAGNOSIS — Z951 Presence of aortocoronary bypass graft: Secondary | ICD-10-CM

## 2015-12-31 HISTORY — DX: Unspecified atrial fibrillation: I48.91

## 2015-12-31 LAB — CBC
HCT: 39.1 % (ref 36.0–46.0)
Hemoglobin: 13.3 g/dL (ref 12.0–15.0)
MCH: 29.1 pg (ref 26.0–34.0)
MCHC: 34 g/dL (ref 30.0–36.0)
MCV: 85.6 fL (ref 78.0–100.0)
Platelets: 293 10*3/uL (ref 150–400)
RBC: 4.57 MIL/uL (ref 3.87–5.11)
RDW: 14.1 % (ref 11.5–15.5)
WBC: 13 10*3/uL — ABNORMAL HIGH (ref 4.0–10.5)

## 2015-12-31 LAB — BASIC METABOLIC PANEL
Anion gap: 15 (ref 5–15)
BUN: 8 mg/dL (ref 6–20)
CO2: 21 mmol/L — ABNORMAL LOW (ref 22–32)
Calcium: 8.6 mg/dL — ABNORMAL LOW (ref 8.9–10.3)
Chloride: 93 mmol/L — ABNORMAL LOW (ref 101–111)
Creatinine, Ser: 1.33 mg/dL — ABNORMAL HIGH (ref 0.44–1.00)
GFR calc Af Amer: 42 mL/min — ABNORMAL LOW (ref >60)
GFR calc non Af Amer: 36 mL/min — ABNORMAL LOW (ref >60)
Glucose, Bld: 116 mg/dL — ABNORMAL HIGH (ref 65–99)
Potassium: 4.6 mmol/L (ref 3.5–5.1)
Sodium: 129 mmol/L — ABNORMAL LOW (ref 135–145)

## 2015-12-31 LAB — CK: Total CK: 195 U/L (ref 38–234)

## 2015-12-31 LAB — D-DIMER, QUANTITATIVE (NOT AT ARMC): D-Dimer, Quant: 0.56 ug/mL-FEU — ABNORMAL HIGH (ref 0.00–0.50)

## 2015-12-31 MED ORDER — SODIUM CHLORIDE 0.9 % IV BOLUS (SEPSIS)
1000.0000 mL | Freq: Once | INTRAVENOUS | Status: AC
  Administered 2015-12-31: 1000 mL via INTRAVENOUS

## 2015-12-31 MED ORDER — DILTIAZEM HCL 100 MG IV SOLR
5.0000 mg/h | INTRAVENOUS | Status: DC
  Administered 2015-12-31: 5 mg/h via INTRAVENOUS

## 2015-12-31 MED ORDER — ENOXAPARIN SODIUM 60 MG/0.6ML ~~LOC~~ SOLN
60.0000 mg | Freq: Once | SUBCUTANEOUS | Status: AC
  Administered 2016-01-01: 60 mg via SUBCUTANEOUS
  Filled 2015-12-31: qty 0.6

## 2015-12-31 MED ORDER — DILTIAZEM HCL 25 MG/5ML IV SOLN
20.0000 mg | Freq: Once | INTRAVENOUS | Status: AC
  Administered 2015-12-31: 20 mg via INTRAVENOUS
  Filled 2015-12-31: qty 5

## 2015-12-31 NOTE — ED Provider Notes (Signed)
[facility] DEPT Provider Note   CSN: [id number] Arrival date & time: 12/31/15  2011     History   Chief Complaint Chief Complaint  Patient presents with  . Atrial Fibrillation  . Fall    HPI Tammy [name] is a 82 y.o. female.  Pt lives by herself and fell 2-3 days ago.  She said that she stumbled and fell.  The pt's daughter has been calling pt and unable to get ahold of her and went to check on her today.  The pt is not sure why she fell.  She denies any pain.  The pt was found to be in a.fib when EMS arrived.  HR in the 180s.  EMS gave pt 10 mg of cardizem and 500 cc of NS.  The pt's HR has improved, but it is still high.       Past Medical History:  Diagnosis Date  . Acid reflux   . Asthma    Asthmatic COPD  . Coronary artery disease    Echo 07/27/2011   . Coronary artery stenosis    , High-grade left main  . GERD (gastroesophageal reflux disease)   . High cholesterol   . Hyperlipidemia   . Hypertension   . Psoriasis     Patient Active Problem List   Diagnosis Date Noted  . Exertional dyspnea 09/11/2015  . Memory loss 11/13/2013  . Abnormal involuntary movements(781.0) 11/13/2013  . Pulmonary hypertension ([place]) 11/02/2013  . Fatigue 11/02/2013  . CAD (coronary artery disease) 11/16/2012  . Mitral regurgitation 11/16/2012  . Renal artery stenosis ([place]) 11/16/2012  . Hyperlipidemia with target LDL less than 70 11/16/2012  . Essential hypertension 11/16/2012    Past Surgical History:  Procedure Laterality Date  . CARDIAC SURGERY    . CORONARY ARTERY BYPASS GRAFT  1994   After catheterization showed 90% ostial left main stenosis  . DOPPLER ECHOCARDIOGRAPHY     Study showed mild to moderate MR with moderate TR, mild to moderate pulmonary hypertension with an ejection fraction greater that 55%.   [name] EYE SURGERY    . Nuclear Perfusion  10/2010, 11/15/2012   Showed normal perfusion, No Ischemia or Infarction, Normal EF  . Renal Scan Duplex  04/09/2012   Showing greater than 50% diameter reduction in the celiac artery and SMA.. Right proximal 275 and Mid 152.  [name] ROTATOR CUFF REPAIR     bilateral  . Sleep Study  12/28/2006   AHI during total sleep time (3h 19 minutes) was 1.81/hr and during REM sleep at 17.78/hr. Mild sleep apnea during REM sleep.Oxygen staturated rate during REM and NREM was 93.0%.    OB History    No data available       Home Medications    Prior to Admission medications   Medication Sig Start Date End Date Taking? Authorizing Provider  amLODipine (NORVASC) 10 MG tablet Take 0.5 tablets (5 mg total) by mouth daily. 07/21/15  Yes [name], MD  aspirin 81 MG tablet Take 81 mg by mouth daily. Take 2 tablets twice daily   Yes Historical Provider, MD  donepezil (ARICEPT) 10 MG tablet Take 1 tablet (10 mg total) by mouth daily. 07/13/14  Yes Marcial Pacas, MD  DULoxetine (CYMBALTA) 60 MG capsule Take 1 capsule by mouth daily. Reported on 08/05/2015 09/13/14  Yes Historical Provider, MD  fluticasone (FLONASE) 50 MCG/ACT nasal spray USE 1 TO 2 SPRAYS IN EACH NOSTRIL ONCE DAILY FOR STUFFY NOSE OR DRAINAGE 07/18/15  Yes Randall Hiss  Kevan Rosebush, MD  furosemide (LASIX) 20 MG tablet Take 1 tablet (20 mg total) by mouth daily. 09/10/15  Yes [name], MD  metoprolol (LOPRESSOR) 100 MG tablet Take 1 tablet (100 mg total) by mouth 2 (two) times daily. 07/21/15  Yes [name], MD  mirtazapine (REMERON) 15 MG tablet Take 15 mg by mouth at bedtime. Reported on 08/05/2015   Yes Historical Provider, MD  omeprazole (PRILOSEC) 20 MG capsule Take 1 capsule (20 mg total) by mouth 2 (two) times daily before a meal. 08/05/15  Yes Jiles Prows, MD  quinapril (ACCUPRIL) 40 MG tablet Take 1 tablet (40 mg total) by mouth daily. 07/21/15  Yes [name], MD  ranitidine (ZANTAC) 300 MG tablet Take 1 tablet (300 mg total) by mouth at bedtime. 07/21/15  Yes [name], MD  simvastatin (ZOCOR) 20 MG tablet Take 1 tablet (20 mg total) by mouth at bedtime. 07/21/15   Yes [name], MD  VENTOLIN HFA 108 (90 BASE) MCG/ACT inhaler  11/27/12  Yes Historical Provider, MD  etodolac (LODINE) 400 MG tablet Take 400 mg by mouth 2 (two) times daily.      Historical Provider, MD  mometasone-formoterol (DULERA) 100-5 MCG/ACT AERO Inhale 2 puffs into the lungs 2 (two) times daily.    Historical Provider, MD    Family History Family History  Problem Relation Age of Onset  . Heart disease Mother   . Hypertension Sister     Social History Social History  Substance Use Topics  . Smoking status: Former Smoker    Quit date: 11/08/1962  . Smokeless tobacco: Never Used  . Alcohol use No     Allergies   Penicillins   Review of Systems Review of Systems  Neurological: Positive for weakness.  All other systems reviewed and are negative.    Physical Exam Updated Vital Signs BP (!) 136/53   Pulse (!) 124   Temp 97.9 F (36.6 C) (Oral)   Resp 25   SpO2 100%   Physical Exam  Constitutional: She is oriented to person, place, and time. She appears well-developed and well-nourished.  HENT:  Head: Normocephalic and atraumatic.  Right Ear: External ear normal.  Left Ear: External ear normal.  Nose: Nose normal.  Mouth/Throat: Oropharynx is clear and moist.  Eyes: Conjunctivae and EOM are normal. Pupils are equal, round, and reactive to light.  Neck: Normal range of motion. Neck supple.  Cardiovascular: Normal heart sounds and intact distal pulses.  An irregularly irregular rhythm present. Tachycardia present.   Pulmonary/Chest: Effort normal and breath sounds normal.  Abdominal: Soft. Bowel sounds are normal.  Musculoskeletal: Normal range of motion.  Neurological: She is alert and oriented to person, place, and time.  Skin: Skin is warm and dry.  Skin tears, and red areas c/w lying in 1 spot  Psychiatric: She has a normal mood and affect. Her behavior is normal. Judgment and thought content normal.  Nursing note and vitals reviewed.    ED  Treatments / Results  Labs (all labs ordered are listed, but only abnormal results are displayed) Labs Reviewed  BASIC METABOLIC PANEL - Abnormal; Notable for the following:       Result Value   Sodium 129 (*)    Chloride 93 (*)    CO2 21 (*)    Glucose, Bld 116 (*)    Creatinine, Ser 1.33 (*)    Calcium 8.6 (*)    GFR calc non Af Amer 36 (*)  GFR calc Af Amer 42 (*)    All other components within normal limits  CBC - Abnormal; Notable for the following:    WBC 13.0 (*)    All other components within normal limits  D-DIMER, QUANTITATIVE (NOT AT [facility]) - Abnormal; Notable for the following:    D-Dimer, Quant 0.56 (*)    All other components within normal limits  CK  MYOGLOBIN, SERUM  URINALYSIS, ROUTINE W REFLEX MICROSCOPIC (NOT AT [facility])    EKG  EKG Interpretation  Date/Time:  Wednesday December 31 2015 20:23:01 EDT Ventricular Rate:  141 PR Interval:    QRS Duration: 80 QT Interval:  293 QTC Calculation: 449 R Axis:   62 Text Interpretation:  Atrial fibrillation with rapid V-rate Repolarization abnormality, prob rate related Confirmed by [facility] MD, Shakyra Mattera ([id number]) on 12/31/2015 10:26:46 PM       Radiology Dg Chest 2 View  Result Date: 12/31/2015 CLINICAL DATA:  82 year old female with shortness of breath. atrial fibrillation. Fall 3 days ago. Initial encounter. EXAM: CHEST  2 VIEW COMPARISON:  07/11/2009 and earlier. FINDINGS: Semi upright AP and lateral views of the chest. Increase cardiomegaly. Stable sequelae of CABG. No pneumothorax. No pulmonary edema or pleural effusion. No confluent pulmonary opacity. Eventration of the diaphragm again noted. New line osteopenia. Visualized tracheal air column is within normal limits. No acute osseous abnormality identified. IMPRESSION: No acute cardiopulmonary abnormality. Cardiomegaly has progressed since 2011. Calcified aortic atherosclerosis. Electronically Signed   By: Genevie Ann M.D.   On: 12/31/2015 21:29   Ct Head Wo  Contrast  Result Date: 12/31/2015 CLINICAL DATA:  Status post fall. May have been on the ground for 2 days. Initial encounter. EXAM: CT HEAD WITHOUT CONTRAST TECHNIQUE: Contiguous axial images were obtained from the base of the skull through the vertex without intravenous contrast. COMPARISON:  CT of the head performed 07/11/2009 FINDINGS: Brain: There is no evidence of acute infarction, mass lesion, or intra- or extra-axial hemorrhage on CT. Prominence of the ventricles and sulci reflects moderate cortical volume loss. Mild cerebellar atrophy is noted. A chronic lacunar infarct is noted at the right cerebellar hemisphere. The brainstem and fourth ventricle are within normal limits. The basal ganglia are unremarkable in appearance. The cerebral hemispheres demonstrate grossly normal gray-white differentiation. No mass effect or midline shift is seen. Vascular: No hyperdense vessel or unexpected calcification. Skull: There is no evidence of fracture; visualized osseous structures are unremarkable in appearance. Sinuses/Orbits: The visualized portions of the orbits are within normal limits. The paranasal sinuses and mastoid air cells are well-aerated. Other: Mild soft tissue swelling is noted lateral to the left orbit. IMPRESSION: 1. No acute intracranial pathology seen on CT. 2. Mild soft tissue swelling lateral to the left orbit. 3. Moderate cortical volume loss noted. 4. Chronic lacunar infarct at the right cerebellar hemisphere. Electronically Signed   By: Garald Balding M.D.   On: 12/31/2015 23:48    Procedures Procedures (including critical care time)  Medications Ordered in ED Medications  diltiazem (CARDIZEM) 100 mg in dextrose 5 % 100 mL (1 mg/mL) infusion (7.5 mg/hr Intravenous Rate/Dose Change 12/31/15 2307)  enoxaparin (LOVENOX) injection 60 mg (not administered)  metoprolol (LOPRESSOR) injection 5 mg (not administered)  sodium chloride 0.9 % bolus 1,000 mL (0 mLs Intravenous Stopped 12/31/15  2307)  diltiazem (CARDIZEM) injection 20 mg (20 mg Intravenous Given 12/31/15 2134)  sodium chloride 0.9 % bolus 1,000 mL (0 mLs Intravenous Stopped 12/31/15 2307)     Initial Impression / Assessment  and Plan / ED Course  I have reviewed the triage vital signs and the nursing notes.  Pertinent labs & imaging results that were available during my care of the patient were reviewed by me and considered in my medical decision making (see chart for details).  Clinical Course   Hr still in the 120s after cardizem bolus then drip.  So, I ordered lopressor 5 mg.  Pt has also had 2L of IVfs.  The pt's head Ct ok, so I ordered lovenox for the a.fib.  Pt will be d/w the hospitalists for admission.  Final Clinical Impressions(s) / ED Diagnoses   Final diagnoses:  Atrial fibrillation with RVR ([place])  Fall, initial encounter  AKI (acute kidney injury) ([place])  Dehydration    New Prescriptions New Prescriptions   No medications on file     Isla Pence, MD 01/02/16 [phone]

## 2015-12-31 NOTE — ED Notes (Signed)
Pt aware of need for urine  

## 2015-12-31 NOTE — ED Triage Notes (Signed)
Pt from home with a fall. unknown time down estimated 2-3days. Daughter talked to pt on Sunday and pt was functioning at baseline. upon EMS arrival pt was in AFIB. No known history. Pt not complaining of pain

## 2015-12-31 NOTE — ED Notes (Signed)
Patient transported to X-ray 

## 2016-01-01 ENCOUNTER — Inpatient Hospital Stay (HOSPITAL_COMMUNITY): Payer: Medicare HMO

## 2016-01-01 ENCOUNTER — Encounter (HOSPITAL_COMMUNITY): Payer: Self-pay | Admitting: Internal Medicine

## 2016-01-01 DIAGNOSIS — K219 Gastro-esophageal reflux disease without esophagitis: Secondary | ICD-10-CM | POA: Diagnosis present

## 2016-01-01 DIAGNOSIS — W19XXXD Unspecified fall, subsequent encounter: Secondary | ICD-10-CM

## 2016-01-01 DIAGNOSIS — I4891 Unspecified atrial fibrillation: Secondary | ICD-10-CM

## 2016-01-01 DIAGNOSIS — N179 Acute kidney failure, unspecified: Secondary | ICD-10-CM | POA: Diagnosis present

## 2016-01-01 DIAGNOSIS — F10239 Alcohol dependence with withdrawal, unspecified: Secondary | ICD-10-CM | POA: Diagnosis present

## 2016-01-01 DIAGNOSIS — Z951 Presence of aortocoronary bypass graft: Secondary | ICD-10-CM | POA: Diagnosis not present

## 2016-01-01 DIAGNOSIS — W19XXXA Unspecified fall, initial encounter: Secondary | ICD-10-CM

## 2016-01-01 DIAGNOSIS — Z7951 Long term (current) use of inhaled steroids: Secondary | ICD-10-CM | POA: Diagnosis not present

## 2016-01-01 DIAGNOSIS — I480 Paroxysmal atrial fibrillation: Secondary | ICD-10-CM | POA: Diagnosis present

## 2016-01-01 DIAGNOSIS — R159 Full incontinence of feces: Secondary | ICD-10-CM | POA: Diagnosis present

## 2016-01-01 DIAGNOSIS — Z87891 Personal history of nicotine dependence: Secondary | ICD-10-CM | POA: Diagnosis not present

## 2016-01-01 DIAGNOSIS — E785 Hyperlipidemia, unspecified: Secondary | ICD-10-CM

## 2016-01-01 DIAGNOSIS — Z880 Allergy status to penicillin: Secondary | ICD-10-CM | POA: Diagnosis not present

## 2016-01-01 DIAGNOSIS — R410 Disorientation, unspecified: Secondary | ICD-10-CM | POA: Diagnosis present

## 2016-01-01 DIAGNOSIS — Z79899 Other long term (current) drug therapy: Secondary | ICD-10-CM | POA: Diagnosis not present

## 2016-01-01 DIAGNOSIS — Z8249 Family history of ischemic heart disease and other diseases of the circulatory system: Secondary | ICD-10-CM | POA: Diagnosis not present

## 2016-01-01 DIAGNOSIS — F0390 Unspecified dementia without behavioral disturbance: Secondary | ICD-10-CM | POA: Diagnosis present

## 2016-01-01 DIAGNOSIS — R296 Repeated falls: Secondary | ICD-10-CM | POA: Diagnosis present

## 2016-01-01 DIAGNOSIS — I10 Essential (primary) hypertension: Secondary | ICD-10-CM

## 2016-01-01 DIAGNOSIS — I272 Other secondary pulmonary hypertension: Secondary | ICD-10-CM | POA: Diagnosis present

## 2016-01-01 DIAGNOSIS — Z7982 Long term (current) use of aspirin: Secondary | ICD-10-CM | POA: Diagnosis not present

## 2016-01-01 DIAGNOSIS — Y92009 Unspecified place in unspecified non-institutional (private) residence as the place of occurrence of the external cause: Secondary | ICD-10-CM | POA: Diagnosis present

## 2016-01-01 DIAGNOSIS — I25119 Atherosclerotic heart disease of native coronary artery with unspecified angina pectoris: Secondary | ICD-10-CM | POA: Diagnosis not present

## 2016-01-01 DIAGNOSIS — E860 Dehydration: Secondary | ICD-10-CM

## 2016-01-01 DIAGNOSIS — J449 Chronic obstructive pulmonary disease, unspecified: Secondary | ICD-10-CM | POA: Diagnosis present

## 2016-01-01 DIAGNOSIS — I959 Hypotension, unspecified: Secondary | ICD-10-CM | POA: Diagnosis not present

## 2016-01-01 DIAGNOSIS — G4730 Sleep apnea, unspecified: Secondary | ICD-10-CM | POA: Diagnosis present

## 2016-01-01 DIAGNOSIS — Z9181 History of falling: Secondary | ICD-10-CM | POA: Diagnosis not present

## 2016-01-01 DIAGNOSIS — I2510 Atherosclerotic heart disease of native coronary artery without angina pectoris: Secondary | ICD-10-CM | POA: Diagnosis present

## 2016-01-01 HISTORY — DX: Unspecified atrial fibrillation: I48.91

## 2016-01-01 LAB — URINALYSIS, ROUTINE W REFLEX MICROSCOPIC
Glucose, UA: NEGATIVE mg/dL
Hgb urine dipstick: NEGATIVE
Ketones, ur: 15 mg/dL — AB
Leukocytes, UA: NEGATIVE
Nitrite: NEGATIVE
Protein, ur: NEGATIVE mg/dL
Specific Gravity, Urine: 1.015 (ref 1.005–1.030)
pH: 5.5 (ref 5.0–8.0)

## 2016-01-01 LAB — RAPID URINE DRUG SCREEN, HOSP PERFORMED
Amphetamines: NOT DETECTED
Barbiturates: NOT DETECTED
Benzodiazepines: NOT DETECTED
Cocaine: NOT DETECTED
Opiates: NOT DETECTED
Tetrahydrocannabinol: NOT DETECTED

## 2016-01-01 MED ORDER — DILTIAZEM HCL 30 MG PO TABS
30.0000 mg | ORAL_TABLET | Freq: Three times a day (TID) | ORAL | Status: DC
  Administered 2016-01-01 – 2016-01-02 (×3): 30 mg via ORAL
  Filled 2016-01-01 (×2): qty 1

## 2016-01-01 MED ORDER — THIAMINE HCL 100 MG/ML IJ SOLN
100.0000 mg | Freq: Every day | INTRAMUSCULAR | Status: DC
  Filled 2016-01-01 (×3): qty 2

## 2016-01-01 MED ORDER — DONEPEZIL HCL 10 MG PO TABS
10.0000 mg | ORAL_TABLET | Freq: Every day | ORAL | Status: DC
  Administered 2016-01-01 – 2016-01-06 (×6): 10 mg via ORAL
  Filled 2016-01-01 (×6): qty 1

## 2016-01-01 MED ORDER — ENOXAPARIN SODIUM 60 MG/0.6ML ~~LOC~~ SOLN
60.0000 mg | Freq: Every day | SUBCUTANEOUS | Status: DC
  Administered 2016-01-01: 60 mg via SUBCUTANEOUS
  Filled 2016-01-01: qty 0.6

## 2016-01-01 MED ORDER — SIMVASTATIN 20 MG PO TABS
20.0000 mg | ORAL_TABLET | Freq: Every day | ORAL | Status: DC
  Administered 2016-01-01: 20 mg via ORAL
  Filled 2016-01-01: qty 1

## 2016-01-01 MED ORDER — LORAZEPAM 2 MG/ML IJ SOLN
0.0000 mg | Freq: Two times a day (BID) | INTRAMUSCULAR | Status: AC
  Administered 2016-01-04: 2 mg via INTRAVENOUS
  Filled 2016-01-01: qty 1

## 2016-01-01 MED ORDER — LORAZEPAM 2 MG/ML IJ SOLN: 1.0000 mg | Freq: Four times a day (QID) | INTRAMUSCULAR | Status: AC | PRN

## 2016-01-01 MED ORDER — LORAZEPAM 1 MG PO TABS: 1.0000 mg | ORAL_TABLET | Freq: Four times a day (QID) | ORAL | Status: AC | PRN

## 2016-01-01 MED ORDER — METOPROLOL TARTRATE 100 MG PO TABS
100.0000 mg | ORAL_TABLET | Freq: Two times a day (BID) | ORAL | Status: DC
  Administered 2016-01-01 – 2016-01-05 (×9): 100 mg via ORAL
  Filled 2016-01-01 (×9): qty 1

## 2016-01-01 MED ORDER — PANTOPRAZOLE SODIUM 40 MG PO TBEC
40.0000 mg | DELAYED_RELEASE_TABLET | Freq: Every day | ORAL | Status: DC
  Administered 2016-01-01 – 2016-01-06 (×6): 40 mg via ORAL
  Filled 2016-01-01 (×6): qty 1

## 2016-01-01 MED ORDER — MIRTAZAPINE 15 MG PO TABS
15.0000 mg | ORAL_TABLET | Freq: Every day | ORAL | Status: DC
Start: 2016-01-01 — End: 2016-01-06
  Administered 2016-01-01 – 2016-01-05 (×5): 15 mg via ORAL
  Filled 2016-01-01 (×6): qty 1

## 2016-01-01 MED ORDER — FAMOTIDINE 20 MG PO TABS
40.0000 mg | ORAL_TABLET | Freq: Every day | ORAL | Status: DC
  Administered 2016-01-01 – 2016-01-05 (×5): 40 mg via ORAL
  Filled 2016-01-01 (×6): qty 2

## 2016-01-01 MED ORDER — MOMETASONE FURO-FORMOTEROL FUM 100-5 MCG/ACT IN AERO
2.0000 | INHALATION_SPRAY | Freq: Two times a day (BID) | RESPIRATORY_TRACT | Status: DC
  Administered 2016-01-01 – 2016-01-06 (×10): 2 via RESPIRATORY_TRACT
  Filled 2016-01-01: qty 8.8

## 2016-01-01 MED ORDER — QUINAPRIL HCL 10 MG PO TABS
40.0000 mg | ORAL_TABLET | Freq: Every day | ORAL | Status: DC
  Administered 2016-01-01 – 2016-01-04 (×4): 40 mg via ORAL
  Filled 2016-01-01 (×5): qty 4

## 2016-01-01 MED ORDER — LORAZEPAM 1 MG PO TABS: 1.0000 mg | ORAL_TABLET | Freq: Four times a day (QID) | ORAL | Status: DC | PRN

## 2016-01-01 MED ORDER — ADULT MULTIVITAMIN W/MINERALS CH: 1.0000 | ORAL_TABLET | Freq: Every day | ORAL | Status: DC

## 2016-01-01 MED ORDER — METOPROLOL TARTRATE 5 MG/5ML IV SOLN: 2.5000 mg | INTRAVENOUS | Status: AC

## 2016-01-01 MED ORDER — VITAMIN B-1 100 MG PO TABS: 100.0000 mg | ORAL_TABLET | Freq: Every day | ORAL | Status: DC

## 2016-01-01 MED ORDER — THIAMINE HCL 100 MG/ML IJ SOLN: 100.0000 mg | Freq: Every day | INTRAMUSCULAR | Status: DC

## 2016-01-01 MED ORDER — LORAZEPAM 2 MG/ML IJ SOLN: 1.0000 mg | Freq: Four times a day (QID) | INTRAMUSCULAR | Status: DC | PRN

## 2016-01-01 MED ORDER — ADULT MULTIVITAMIN W/MINERALS CH
1.0000 | ORAL_TABLET | Freq: Every day | ORAL | Status: DC
  Administered 2016-01-01 – 2016-01-06 (×6): 1 via ORAL
  Filled 2016-01-01 (×6): qty 1

## 2016-01-01 MED ORDER — VITAMIN B-1 100 MG PO TABS
100.0000 mg | ORAL_TABLET | Freq: Every day | ORAL | Status: DC
  Administered 2016-01-01 – 2016-01-06 (×6): 100 mg via ORAL
  Filled 2016-01-01 (×5): qty 1

## 2016-01-01 MED ORDER — ACETAMINOPHEN 325 MG PO TABS: 650.0000 mg | ORAL_TABLET | ORAL | Status: DC | PRN

## 2016-01-01 MED ORDER — ASPIRIN EC 81 MG PO TBEC
162.0000 mg | DELAYED_RELEASE_TABLET | Freq: Two times a day (BID) | ORAL | Status: DC
  Administered 2016-01-01 (×2): 162 mg via ORAL
  Filled 2016-01-01 (×2): qty 2

## 2016-01-01 MED ORDER — FOLIC ACID 1 MG PO TABS: 1.0000 mg | ORAL_TABLET | Freq: Every day | ORAL | Status: DC

## 2016-01-01 MED ORDER — METOPROLOL TARTRATE 5 MG/5ML IV SOLN
5.0000 mg | Freq: Once | INTRAVENOUS | Status: AC
  Administered 2016-01-01: 5 mg via INTRAVENOUS
  Filled 2016-01-01: qty 5

## 2016-01-01 MED ORDER — FOLIC ACID 1 MG PO TABS
1.0000 mg | ORAL_TABLET | Freq: Every day | ORAL | Status: DC
  Administered 2016-01-01 – 2016-01-06 (×6): 1 mg via ORAL
  Filled 2016-01-01 (×6): qty 1

## 2016-01-01 MED ORDER — LORAZEPAM 2 MG/ML IJ SOLN
0.0000 mg | Freq: Four times a day (QID) | INTRAMUSCULAR | Status: AC
  Administered 2016-01-02: 2 mg via INTRAVENOUS
  Filled 2016-01-01 (×2): qty 1

## 2016-01-01 MED ORDER — IOPAMIDOL (ISOVUE-370) INJECTION 76%
INTRAVENOUS | Status: AC
  Administered 2016-01-01: 80 mL
  Filled 2016-01-01: qty 100

## 2016-01-01 MED ORDER — ONDANSETRON HCL 4 MG/2ML IJ SOLN: 4.0000 mg | Freq: Four times a day (QID) | INTRAMUSCULAR | Status: DC | PRN

## 2016-01-01 MED ORDER — DULOXETINE HCL 60 MG PO CPEP
60.0000 mg | ORAL_CAPSULE | Freq: Every day | ORAL | Status: DC
  Administered 2016-01-01 – 2016-01-06 (×6): 60 mg via ORAL
  Filled 2016-01-01 (×5): qty 1
  Filled 2016-01-01: qty 2

## 2016-01-01 NOTE — Discharge Instructions (Signed)

## 2016-01-01 NOTE — ED Notes (Signed)
Attempted report 

## 2016-01-01 NOTE — Consult Note (Addendum)
CARDIOLOGY CONSULT NOTE   Patient ID: AL NOURY MRN: [id number] DOB/AGE: 1930-12-01 82 y.o.  Admit date: 12/31/2015   Requesting Physician: Dr. Tana Coast Primary Physician:    Melinda Crutch, MD Primary Cardiologist:  Dr. Claiborne Billings  Reason for Consultation: afib with RVR.  HPI: Tammy [name] is a 82 y.o. female with a history of CAD s/p CABG (1994)  after she was found to have high-grade left main stenosis, renal artery stenosis, HTN, HLD, dementia, heavy alcohol consumption and GERD who presented to [facility] ED after being found down for two days after a mechanical fall. Upon arrival to [facility] ED she was found to to be in new onset afib with RVR and cardiology consulted.   Her last nuclear study was in 11/2014 which was normal.  She was last seen by Dr. Claiborne Billings in 08/2015. She was doing well from a cardiac standpoint but had worsening lower extremity edema and HCTZ was switched to Lasix.  2D ECHO 09/2015 showed normal LV size with mild focal basal septal hypertrophy. EF60-65%. Normal RV size and systolic function. No significantvalvular abnormalities.  She lives at home alone. She was in her usual state of health until 2 days ago when she had a mechanical fall walking to the refrigerator.  Family last spoke with her Sunday and unable to reach until finally came to check on her today (daughter was out of town).  Police helped to break into the house and she was lying in the floor.  Minimal clothes on but was able to get up to get Depends.  Minimal PO intake She was brought to [facility] for further work up where she was found to be in A. fib with RVR and started on IV Cardizem for rate control and given IV fluids. She has since converted back to sinus rhythm about 1:30 AM last night.   The patient said she has been feeling weak with generalized fatigue for months now. When she fell walking to the refrigerator she could not get up. She was disoriented on how many days that she was on the ground alone but does not  think she actually lost consciousness. She has not had any chest pain but did feel her heart palpitating. She does have chronic shortness of breath with exertion. No lower extremity edema, orthopnea or PND. No dizziness or syncope. She denies falling a lot, but her daughter who entered the room when I was just leaving states that she has fallen about 6 times over the past 2 years. She is very concerned about her living alone with her fall risk. She also drinks at least 4 glasses of wine a night. She thinks that she can cut back on this.  Past Medical History:  Diagnosis Date  . Acid reflux   . Asthma    Asthmatic COPD  . Atrial fibrillation ([place]) 01/01/2016  . Coronary artery disease    Echo 07/27/2011   . Coronary artery stenosis    , High-grade left main  . GERD (gastroesophageal reflux disease)   . Hyperlipidemia   . Hypertension   . Psoriasis      Past Surgical History:  Procedure Laterality Date  . CARDIAC SURGERY    . CORONARY ARTERY BYPASS GRAFT  1994   After catheterization showed 90% ostial left main stenosis  . DOPPLER ECHOCARDIOGRAPHY     Study showed mild to moderate MR with moderate TR, mild to moderate pulmonary hypertension with an ejection fraction greater that 55%.   [name]  EYE SURGERY    . Nuclear Perfusion  10/2010, 11/15/2012   Showed normal perfusion, No Ischemia or Infarction, Normal EF  . Renal Scan Duplex  04/09/2012   Showing greater than 50% diameter reduction in the celiac artery and SMA.. Right proximal 275 and Mid 152.  [name] ROTATOR CUFF REPAIR     bilateral  . Sleep Study  12/28/2006   AHI during total sleep time (3h 19 minutes) was 1.81/hr and during REM sleep at 17.78/hr. Mild sleep apnea during REM sleep.Oxygen staturated rate during REM and NREM was 93.0%.    Allergies  Allergen Reactions  . Penicillins Rash and Hives    I have reviewed the patient's current medications . aspirin EC  162 mg Oral BID  . donepezil  10 mg Oral Daily  . DULoxetine  60  mg Oral Daily  . enoxaparin (LOVENOX) injection  60 mg Subcutaneous Daily  . famotidine  40 mg Oral QHS  . folic acid  1 mg Oral Daily  . LORazepam  0-4 mg Intravenous Q6H   Followed by  . [START ON 01/03/2016] LORazepam  0-4 mg Intravenous Q12H  . metoprolol  100 mg Oral BID  . mirtazapine  15 mg Oral QHS  . mometasone-formoterol  2 puff Inhalation BID  . multivitamin with minerals  1 tablet Oral Daily  . pantoprazole  40 mg Oral Daily  . quinapril  40 mg Oral Daily  . simvastatin  20 mg Oral QHS  . thiamine  100 mg Oral Daily   Or  . thiamine  100 mg Intravenous Daily     acetaminophen, LORazepam **OR** LORazepam, ondansetron (ZOFRAN) IV  Prior to Admission medications   Medication Sig Start Date End Date Taking? Authorizing Provider  amLODipine (NORVASC) 10 MG tablet Take 0.5 tablets (5 mg total) by mouth daily. 07/21/15  Yes [name], MD  aspirin 81 MG tablet Take 81 mg by mouth daily. Take 2 tablets twice daily   Yes Historical Provider, MD  donepezil (ARICEPT) 10 MG tablet Take 1 tablet (10 mg total) by mouth daily. 07/13/14  Yes Marcial Pacas, MD  DULoxetine (CYMBALTA) 60 MG capsule Take 1 capsule by mouth daily. Reported on 08/05/2015 09/13/14  Yes Historical Provider, MD  fluticasone (FLONASE) 50 MCG/ACT nasal spray USE 1 TO 2 SPRAYS IN EACH NOSTRIL ONCE DAILY FOR STUFFY NOSE OR DRAINAGE 07/18/15  Yes Jiles Prows, MD  furosemide (LASIX) 20 MG tablet Take 1 tablet (20 mg total) by mouth daily. 09/10/15  Yes [name], MD  metoprolol (LOPRESSOR) 100 MG tablet Take 1 tablet (100 mg total) by mouth 2 (two) times daily. 07/21/15  Yes [name], MD  mirtazapine (REMERON) 15 MG tablet Take 15 mg by mouth at bedtime. Reported on 08/05/2015   Yes Historical Provider, MD  omeprazole (PRILOSEC) 20 MG capsule Take 1 capsule (20 mg total) by mouth 2 (two) times daily before a meal. 08/05/15  Yes Jiles Prows, MD  quinapril (ACCUPRIL) 40 MG tablet Take 1 tablet (40 mg total) by mouth  daily. 07/21/15  Yes [name], MD  ranitidine (ZANTAC) 300 MG tablet Take 1 tablet (300 mg total) by mouth at bedtime. 07/21/15  Yes [name], MD  simvastatin (ZOCOR) 20 MG tablet Take 1 tablet (20 mg total) by mouth at bedtime. 07/21/15  Yes [name], MD  VENTOLIN HFA 108 (90 BASE) MCG/ACT inhaler  11/27/12  Yes Historical Provider, MD  etodolac (LODINE) 400 MG  tablet Take 400 mg by mouth 2 (two) times daily.      Historical Provider, MD  mometasone-formoterol (DULERA) 100-5 MCG/ACT AERO Inhale 2 puffs into the lungs 2 (two) times daily.    Historical Provider, MD     Social History   Social History  . Marital status: Single    Spouse name: N/A  . Number of children: N/A  . Years of education: N/A   Occupational History  . Not on file.   Social History Main Topics  . Smoking status: Former Smoker    Quit date: 11/08/1962  . Smokeless tobacco: Never Used  . Alcohol use 0.0 oz/week     Comment: drinks wine/beer daily  . Drug use: No  . Sexual activity: Not on file   Other Topics Concern  . Not on file   Social History Narrative   Patient lives at home alone.    Patient is divorced.    Patient has one child.    Patient has a high school education.    Patient quit tobacco 40 years ago.    Patient drinks coffee and tea daily.     Family Status  Relation Status  . Mother Deceased  . Father Deceased  . Sister Alive  . Brother Deceased   Family History  Problem Relation Age of Onset  . Heart disease Mother   . Hypertension Sister     ROS:  Full 14 point review of systems complete and found to be negative unless listed above.  Physical Exam: Blood pressure 131/62, pulse 98, temperature 98.3 F (36.8 C), temperature source Axillary, resp. rate (!) 21, height 5\' 2"  (1.575 m), weight 137 lb 2 oz (62.2 kg), SpO2 95 %.  General: Well developed, well nourished, female in no acute distress Head: Eyes PERRLA, No xanthomas.   Normocephalic and atraumatic, oropharynx  without edema or exudate. Lungs:  CTAB Heart: HRRR S1 S2, no rub/gallop, Heart regular rate and rhythm with S1, S2  murmur. pulses are 2+ extrem.   Neck: No carotid bruits. No lymphadenopathy. No JVD. Abdomen: Bowel sounds present, abdomen soft and non-tender without masses or hernias noted. Msk:  No spine or cva tenderness. No weakness, no joint deformities or effusions. Extremities: No clubbing or cyanosis.  No LE edema.  Neuro: Alert and oriented X 3. No focal deficits noted. Psych:  Good affect, responds appropriately Skin: No rashes or lesions noted.  Labs:   Lab Results  Component Value Date   WBC 13.0 (H) 12/31/2015   HGB 13.3 12/31/2015   HCT 39.1 12/31/2015   MCV 85.6 12/31/2015   PLT 293 12/31/2015   No results for input(s): INR in the last 72 hours.  Recent Labs Lab 12/31/15 2042  NA 129*  K 4.6  CL 93*  CO2 21*  BUN 8  CREATININE 1.33*  CALCIUM 8.6*  GLUCOSE 116*   Magnesium  Date Value Ref Range Status  11/24/2006 1.8  Final    Recent Labs  12/31/15 2042  CKTOTAL 195   Lab Results  Component Value Date   CHOL 176 11/02/2013   HDL 64 11/02/2013   LDLCALC 82 11/02/2013   TRIG 151 (H) 11/02/2013   Lab Results  Component Value Date   DDIMER 0.56 (H) 12/31/2015   Troponin (Point of Care Test) No results for input(s): TROPIPOC in the last 72 hours.  Echo: 10/01/2015 LV EF: 60% -   65% Study Conclusions - Left ventricle: The cavity size was normal. There was mild  focal   basal hypertrophy of the septum. Systolic function was normal.   The estimated ejection fraction was in the range of 60% to 65%.   Wall motion was normal; there were no regional wall motion   abnormalities. Doppler parameters are consistent with abnormal   left ventricular relaxation (grade 1 diastolic dysfunction). - Aortic valve: There was no stenosis. - Mitral valve: Mildly calcified annulus. There was no significant   regurgitation. - Left atrium: The atrium was mildly  dilated. - Right ventricle: The cavity size was normal. Systolic function   was normal. - Tricuspid valve: Peak RV-RA gradient (S): 27 mm Hg. - Pulmonary arteries: PA peak pressure: 30 mm Hg (S). - Inferior vena cava: The vessel was normal in size. The   respirophasic diameter changes were in the normal range (>= 50%),   consistent with normal central venous pressure. Impressions: - Normal LV size with mild focal basal septal hypertrophy. EF   60-65%. Normal RV size and systolic function. No significant   valvular abnormalities.  ECG:   A. fib with RVR, rate related repol abnormality   Radiology:  Dg Chest 2 View  Result Date: 12/31/2015 CLINICAL DATA:  82 year old female with shortness of breath. atrial fibrillation. Fall 3 days ago. Initial encounter. EXAM: CHEST  2 VIEW COMPARISON:  07/11/2009 and earlier. FINDINGS: Semi upright AP and lateral views of the chest. Increase cardiomegaly. Stable sequelae of CABG. No pneumothorax. No pulmonary edema or pleural effusion. No confluent pulmonary opacity. Eventration of the diaphragm again noted. New line osteopenia. Visualized tracheal air column is within normal limits. No acute osseous abnormality identified. IMPRESSION: No acute cardiopulmonary abnormality. Cardiomegaly has progressed since 2011. Calcified aortic atherosclerosis. Electronically Signed   By: Genevie Ann M.D.   On: 12/31/2015 21:29   Ct Head Wo Contrast  Result Date: 12/31/2015 CLINICAL DATA:  Status post fall. May have been on the ground for 2 days. Initial encounter. EXAM: CT HEAD WITHOUT CONTRAST TECHNIQUE: Contiguous axial images were obtained from the base of the skull through the vertex without intravenous contrast. COMPARISON:  CT of the head performed 07/11/2009 FINDINGS: Brain: There is no evidence of acute infarction, mass lesion, or intra- or extra-axial hemorrhage on CT. Prominence of the ventricles and sulci reflects moderate cortical volume loss. Mild cerebellar atrophy  is noted. A chronic lacunar infarct is noted at the right cerebellar hemisphere. The brainstem and fourth ventricle are within normal limits. The basal ganglia are unremarkable in appearance. The cerebral hemispheres demonstrate grossly normal gray-white differentiation. No mass effect or midline shift is seen. Vascular: No hyperdense vessel or unexpected calcification. Skull: There is no evidence of fracture; visualized osseous structures are unremarkable in appearance. Sinuses/Orbits: The visualized portions of the orbits are within normal limits. The paranasal sinuses and mastoid air cells are well-aerated. Other: Mild soft tissue swelling is noted lateral to the left orbit. IMPRESSION: 1. No acute intracranial pathology seen on CT. 2. Mild soft tissue swelling lateral to the left orbit. 3. Moderate cortical volume loss noted. 4. Chronic lacunar infarct at the right cerebellar hemisphere. Electronically Signed   By: Garald Balding M.D.   On: 12/31/2015 23:48   Ct Angio Chest Pe W And/or Wo Contrast  Result Date: 01/01/2016 CLINICAL DATA:  82 y/o F; shortness of breath and positive D-dimer. History of atrial fibrillation, hypertension, and coronary artery disease. EXAM: CT ANGIOGRAPHY CHEST WITH CONTRAST TECHNIQUE: Multidetector CT imaging of the chest was performed using the standard protocol during bolus administration of  intravenous contrast. Multiplanar CT image reconstructions and MIPs were obtained to evaluate the vascular anatomy. CONTRAST:  100 cc Isovue 370. COMPARISON:  Chest radiograph dated 12/31/2015. Chest CT dated 7/8/8. FINDINGS: Mediastinum/Lymph Nodes: No pulmonary emboli or thoracic aortic dissection identified. No masses or pathologically enlarged lymph nodes identified. Cardiac: Mild coronary artery calcifications. Postsurgical changes related to a LIMA bypass graft and saphenous graft. Healed median sternotomy. No cardiomegaly. No pericardial effusion. Lungs/Pleura: No pulmonary mass,  infiltrate, or effusion. Mild centrilobular emphysema with upper lobe predominance. Upper abdomen: Gallstones.  No acute findings. Musculoskeletal: No chest wall mass or suspicious bone lesions identified. Chronic left anterior rib fracture deformities. Multilevel degenerative changes of the spine. Flowing anterior ossification of multiple thoracic vertebral bodies compatible with DISH. Review of the MIP images confirms the above findings. IMPRESSION: 1. No pulmonary embolus is identified.  No acute pulmonary process. 2. Mild emphysema. 3. Gallstones. Electronically Signed   By: Kristine Garbe M.D.   On: 01/01/2016 03:06    ASSESSMENT AND PLAN:    Principal Problem:   Atrial fibrillation with RVR (HCC) Active Problems:   Hyperlipidemia with target LDL less than 70   Essential hypertension   Memory loss   Alcohol dependence ([place])   Falls  New onset afib with RVR: originally placed on cardizem with improvement of rate. She is currently on Lopressor 100mg  BID and has now spontaneously converted into NSR ~ 1:30 am last night.  -- CHADSVASC score at least 5 (HTN, age, 55 dz, F sex). Not sure if she is a [facility] candidate due to heavy alcohol use and frequent falls. Currently on Lovenox per IM. Will defer to Dr. Claiborne Billings.  -- 2D ECHO in 09/2015 showed normal LV function. I do not think we need to update this at this point.  -- TSH normal  -- I discussed the association of heavy alcohol use and PAF with the patient and her daughter.  Elevated D-Dimer: CTA neg for PE  Falls: surprisingly, the patient did not have rhabdomyolysis despite being mostly unable to move for the last 2-3 days. Per daughter she has had 6 falls over the past couple years. She is very concerned about her continuing to live alone especially with her worsening dementia and continued alcohol use. CTA was negative for acute injury  CAD: stable. Would continue aspirin, beta blocker and statin  HTN: Blood pressure currently well  controlled  HLD: continue statin   Signed: Angelena Form, PA-C 01/01/2016 11:10 AM  Pager [phone]  Co-Sign MD   Patient seen and examined. Agree with assessment and plan.  Tammy [name] is well-known to me.  She is 82 years old and underwent CABG revascularization surgery in 1994.  Has a history of hypertension and mild pulmonary hypertension.  She also has developed issues with lower extremity edema for which her HCTZ was discontinued and replaced with furosemide earlier this year.  Unfortunately, she admits to frequent excessive EtOH intake.  In the past, she had a prior fall and was down for almost 5 days before she was found.  Her last nuclear perfusion study was in 2016 , which remain normal and was without scar or ischemia.  EF was 74%.  She has a history of asthma.  She admits to recent significant EtOH use.  She had fallen 2 days ago when she lost her balance.  Found 48 hours later yesterday and was brought to the hospital for evaluation and admission.  Fortunately, she did not develop any acute rhabdomyolysis.  Her d-dimer was mildly positive, but CT was negative for PE.  She was found to be in new onset atrial fibrillation with a rapid ventricular response.  Ends spontaneously converted to sinus rhythm early this morning.  She has been on Lopressor 100 mg twice a day and had received Cardizem.  An echo Doppler study had been done in May 2017 continues to show normal function with an EF of 60-65% and evidence for mild focal basal septal hypertrophy.  She denies any recent episodes of chest pain.  Her edema has improved with change to furosemide from HCTZ.  She denied previous orthostatic symptoms.  Her ECG from last evening shows atrial fibrillation at 141 bpm with nonspecific ST-T changes inferolaterally.  She is on subcutaneous Lovenox 60 mg daily.  Telemetry now shows sinus rhythm.  She had been on amlodipine prior to admission and will change this to Cardizem for continued improved  rate control.  Her blood pressure is presently stable.  I had a long discussion with she and her daughter.  I'm concerned about future falls and as result do not feel at this point that long-term Coumadin anticoagulation should be implemented.  I also brought up to subject of possible need for assisted living.  I discussed the adverse consequences of heavy EtOH use in particular with reference to atrial fibrillation development in addition to additional comorbidities.  I would check troponin level.  We will follow the patient with you.   [name], MD, [facility] 01/01/2016 5:13 PM

## 2016-01-01 NOTE — ED Notes (Signed)
Admitting MD at bedside.

## 2016-01-01 NOTE — Progress Notes (Signed)
[facility]                                                                              Patient Demographics  Tammy [name], is a 82 y.o. female, DOB - 12/04/1930, ZK:[id number]  Admit date - 12/31/2015   Admitting Physician Karmen Bongo, MD  Outpatient Primary MD for the patient is  Melinda Crutch, MD  Outpatient specialists:   LOS - 0  days    Chief Complaint  Patient presents with  . Atrial Fibrillation  . Fall       Brief summary   Tammy [name] is a 82 y.o. female with medical history significant of *CAD, HLD, HTN, and COPD.  She lives at home alone.  Family last spoke with her Sunday and unable to reach until finally came to check on her today (daughter was out of town).  Police helped to break into the house and she was lying in the floor.  Minimal clothes on but was able to get up to get Depends.  Minimal PO intake.  No h/o afib.  Remote h/o CABG.  No c/o pain - no injuries from the fall.  Patient reports that her foot hit something and she lost her balance.  Denies chest pains, +palpitations intermittently.  Has been c/o not feeling well.  Had appointment with Dr. Georgina Peer last week and didn't go because she wasn't feeling well enough.  Daughter reported she has been deteriorating.  Is having problems with memory.  Still driving.  Has urinary and fecal incontinence which keep her mostly at home.   Drinks wine and beer daily.  Started drinking about age 32.  Binge drinking.  Most she ever drinks in a day is a couple bottles of wine.  Denies h/o withdrawal including DTs, seizures.  Does acknowledge that she has a problem with ETOH, thinks she can quit on her own. ED Course: Diagnoses: Afib with RVR - Cardizem bolus and drip; treatment-dose Lovenox; Lopressor    Assessment & Plan   New-onset afib with RVR -Patient initially required a Diltiazem drip as well as Lopressor pushes in ER but was eventually able to be weaned off and finally converted back to NSR -  CHA2DS2-VASc Score is 5, cont lovenox, anticoagulation per cardiology -She remains a very high fall risk, particularly if she continues drinking - TSH normal, CTA neg for PE  Falls -No rhabdomyolysis despite being mostly unable to move for the last 2-3 days, has frequent falls -This is likely multifactorial but very likely related in large part to her dementia and alcohol use  Alcohol dependence with acute withdrawls -By her description, this is more binge drinking than daily, she is capable of consuming large amounts of ETOH - up to several bottles of wine daily - placed on scheduled ativan with CIWA protocol and thiamine/folate - SW consult for substance abuse -Check UDS  Memory loss -May be related to ETOH but Alzheimer's is also a consideration -She may require placement, as with falls and ETOH, may no longer be safe for her to live at home alone, PT eval pending   HTN - controlled -Continue Lopressor, Accupril  HLD -With h/o CAD, LDL goal is <70 -Continue Zocor for now but may need Lipitor or Crestor in the future if unable to meet LDL goal on Zocor (still has room, as she is only taking 20 mg daily)  Code Status: full DVT Prophylaxis:  Lovenox  Family Communication: Discussed in detail with the patient, all imaging results, lab results explained to the patient   Disposition Plan:   Time Spent in minutes  25 minutes  Procedures:    Consultants:   cardiology  Antimicrobials:      Medications  Scheduled Meds: . aspirin EC  162 mg Oral BID  . donepezil  10 mg Oral Daily  . DULoxetine  60 mg Oral Daily  . enoxaparin (LOVENOX) injection  60 mg Subcutaneous Daily  . famotidine  40 mg Oral QHS  . folic acid  1 mg Oral Daily  . LORazepam  0-4 mg Intravenous Q6H   Followed by  . [START ON 01/03/2016] LORazepam  0-4 mg Intravenous Q12H  . metoprolol  100 mg Oral BID  . mirtazapine  15 mg Oral QHS  . mometasone-formoterol  2 puff Inhalation BID  .  multivitamin with minerals  1 tablet Oral Daily  . pantoprazole  40 mg Oral Daily  . quinapril  40 mg Oral Daily  . simvastatin  20 mg Oral QHS  . thiamine  100 mg Oral Daily   Or  . thiamine  100 mg Intravenous Daily   Continuous Infusions:  PRN Meds:.acetaminophen, LORazepam **OR** LORazepam, ondansetron (ZOFRAN) IV   Antibiotics   Anti-infectives    None        Subjective:   Bralynn Mcquary was seen and examined today. Rate controlled. Patient denies dizziness, chest pain, shortness of breath, abdominal pain, N/V/D/C, new weakness, numbess, tingling. No acute events overnight. Somewhat shaky.    Objective:   Vitals:   01/01/16 0145 01/01/16 0300 01/01/16 0421 01/01/16 1025  BP: (!) 118/21 (!) 114/41 124/60 131/62  Pulse: 80 86 90 98  Resp: 24 23 (!) 21   Temp:   98.3 F (36.8 C)   TempSrc:   Axillary   SpO2: 100% 100% 95%   Weight:   62.2 kg (137 lb 2 oz)   Height:   5\' 2"  (1.575 m)     Intake/Output Summary (Last 24 hours) at 01/01/16 1228 Last data filed at 01/01/16 1032  Gross per 24 hour  Intake              360 ml  Output                1 ml  Net              359 ml     Wt Readings from Last 3 Encounters:  01/01/16 62.2 kg (137 lb 2 oz)  09/10/15 64 kg (141 lb 1 oz)  08/05/15 59.9 kg (132 lb)     Exam  General: Alert and oriented x 3, NAD  HEENT:    Neck: Supple, no JVD  Cardiovascular: S1 S2 auscultated, no rubs, murmurs or gallops. Regular rate and rhythm.  Respiratory: Clear to auscultation bilaterally, no wheezing, rales or rhonchi  Gastrointestinal: Soft, nontender, nondistended, + bowel sounds  Ext: no cyanosis clubbing or edema, tremulous   Neuro: AAOx3, Cr N's II- XII. Strength 5/5 upper and lower extremities bilaterally  Skin: No rashes  Psych: Normal affect and demeanor, alert and oriented x3    Data Reviewed:  I have  personally reviewed following labs and imaging studies  Micro Results No results found for this or any  previous visit (from the past 240 hour(s)).  Radiology Reports Dg Chest 2 View  Result Date: 12/31/2015 CLINICAL DATA:  82 year old female with shortness of breath. atrial fibrillation. Fall 3 days ago. Initial encounter. EXAM: CHEST  2 VIEW COMPARISON:  07/11/2009 and earlier. FINDINGS: Semi upright AP and lateral views of the chest. Increase cardiomegaly. Stable sequelae of CABG. No pneumothorax. No pulmonary edema or pleural effusion. No confluent pulmonary opacity. Eventration of the diaphragm again noted. New line osteopenia. Visualized tracheal air column is within normal limits. No acute osseous abnormality identified. IMPRESSION: No acute cardiopulmonary abnormality. Cardiomegaly has progressed since 2011. Calcified aortic atherosclerosis. Electronically Signed   By: Genevie Ann M.D.   On: 12/31/2015 21:29   Ct Head Wo Contrast  Result Date: 12/31/2015 CLINICAL DATA:  Status post fall. May have been on the ground for 2 days. Initial encounter. EXAM: CT HEAD WITHOUT CONTRAST TECHNIQUE: Contiguous axial images were obtained from the base of the skull through the vertex without intravenous contrast. COMPARISON:  CT of the head performed 07/11/2009 FINDINGS: Brain: There is no evidence of acute infarction, mass lesion, or intra- or extra-axial hemorrhage on CT. Prominence of the ventricles and sulci reflects moderate cortical volume loss. Mild cerebellar atrophy is noted. A chronic lacunar infarct is noted at the right cerebellar hemisphere. The brainstem and fourth ventricle are within normal limits. The basal ganglia are unremarkable in appearance. The cerebral hemispheres demonstrate grossly normal gray-white differentiation. No mass effect or midline shift is seen. Vascular: No hyperdense vessel or unexpected calcification. Skull: There is no evidence of fracture; visualized osseous structures are unremarkable in appearance. Sinuses/Orbits: The visualized portions of the orbits are within normal limits.  The paranasal sinuses and mastoid air cells are well-aerated. Other: Mild soft tissue swelling is noted lateral to the left orbit. IMPRESSION: 1. No acute intracranial pathology seen on CT. 2. Mild soft tissue swelling lateral to the left orbit. 3. Moderate cortical volume loss noted. 4. Chronic lacunar infarct at the right cerebellar hemisphere. Electronically Signed   By: Garald Balding M.D.   On: 12/31/2015 23:48   Ct Angio Chest Pe W And/or Wo Contrast  Result Date: 01/01/2016 CLINICAL DATA:  82 y/o F; shortness of breath and positive D-dimer. History of atrial fibrillation, hypertension, and coronary artery disease. EXAM: CT ANGIOGRAPHY CHEST WITH CONTRAST TECHNIQUE: Multidetector CT imaging of the chest was performed using the standard protocol during bolus administration of intravenous contrast. Multiplanar CT image reconstructions and MIPs were obtained to evaluate the vascular anatomy. CONTRAST:  100 cc Isovue 370. COMPARISON:  Chest radiograph dated 12/31/2015. Chest CT dated 7/8/8. FINDINGS: Mediastinum/Lymph Nodes: No pulmonary emboli or thoracic aortic dissection identified. No masses or pathologically enlarged lymph nodes identified. Cardiac: Mild coronary artery calcifications. Postsurgical changes related to a LIMA bypass graft and saphenous graft. Healed median sternotomy. No cardiomegaly. No pericardial effusion. Lungs/Pleura: No pulmonary mass, infiltrate, or effusion. Mild centrilobular emphysema with upper lobe predominance. Upper abdomen: Gallstones.  No acute findings. Musculoskeletal: No chest wall mass or suspicious bone lesions identified. Chronic left anterior rib fracture deformities. Multilevel degenerative changes of the spine. Flowing anterior ossification of multiple thoracic vertebral bodies compatible with DISH. Review of the MIP images confirms the above findings. IMPRESSION: 1. No pulmonary embolus is identified.  No acute pulmonary process. 2. Mild emphysema. 3. Gallstones.  Electronically Signed   By: Edgardo Roys.D.  On: 01/01/2016 03:06    Lab Data:  CBC:  Recent Labs Lab 12/31/15 2042  WBC 13.0*  HGB 13.3  HCT 39.1  MCV 85.6  PLT 0000000   Basic Metabolic Panel:  Recent Labs Lab 12/31/15 2042  NA 129*  K 4.6  CL 93*  CO2 21*  GLUCOSE 116*  BUN 8  CREATININE 1.33*  CALCIUM 8.6*   GFR: Estimated Creatinine Clearance: 27.8 mL/min (by C-G formula based on SCr of 1.33 mg/dL). Liver Function Tests: No results for input(s): AST, ALT, ALKPHOS, BILITOT, PROT, ALBUMIN in the last 168 hours. No results for input(s): LIPASE, AMYLASE in the last 168 hours. No results for input(s): AMMONIA in the last 168 hours. Coagulation Profile: No results for input(s): INR, PROTIME in the last 168 hours. Cardiac Enzymes:  Recent Labs Lab 12/31/15 2042  CKTOTAL 195   BNP (last 3 results) No results for input(s): PROBNP in the last 8760 hours. HbA1C: No results for input(s): HGBA1C in the last 72 hours. CBG: No results for input(s): GLUCAP in the last 168 hours. Lipid Profile: No results for input(s): CHOL, HDL, LDLCALC, TRIG, CHOLHDL, LDLDIRECT in the last 72 hours. Thyroid Function Tests: No results for input(s): TSH, T4TOTAL, FREET4, T3FREE, THYROIDAB in the last 72 hours. Anemia Panel: No results for input(s): VITAMINB12, FOLATE, FERRITIN, TIBC, IRON, RETICCTPCT in the last 72 hours. Urine analysis:    Component Value Date/Time   COLORURINE YELLOW 12/31/2015 2355   APPEARANCEUR CLEAR 12/31/2015 2355   LABSPEC 1.015 12/31/2015 2355   PHURINE 5.5 12/31/2015 2355   GLUCOSEU NEGATIVE 12/31/2015 2355   HGBUR NEGATIVE 12/31/2015 2355   BILIRUBINUR SMALL (A) 12/31/2015 2355   KETONESUR 15 (A) 12/31/2015 2355   PROTEINUR NEGATIVE 12/31/2015 2355   UROBILINOGEN 0.2 07/11/2009 2220   NITRITE NEGATIVE 12/31/2015 2355   LEUKOCYTESUR NEGATIVE 12/31/2015 2355     RAI,RIPUDEEP M.D. [facility] 01/01/2016, 12:28 PM  Pager:  [phone] Between 7am to 7pm - call Pager - [phone]  After 7pm go to [email or web address] - password [email or web address]  Call night coverage person covering after 7pm

## 2016-01-01 NOTE — Progress Notes (Signed)
ANTICOAGULATION CONSULT NOTE - Initial Consult  Pharmacy Consult for Lovenox Indication: atrial fibrillation  Allergies  Allergen Reactions  . Penicillins Rash and Hives    Patient Measurements: Height: 5\' 2"  (157.5 cm) Weight: 137 lb 2 oz (62.2 kg) IBW/kg (Calculated) : 50.1  Vital Signs: Temp: 98.3 F (36.8 C) (08/17 0421) Temp Source: Axillary (08/17 0421) BP: 124/60 (08/17 0421) Pulse Rate: 90 (08/17 0421)  Labs:  Recent Labs  12/31/15 2042  HGB 13.3  HCT 39.1  PLT 293  CREATININE 1.33*  CKTOTAL 195    Estimated Creatinine Clearance: 27.8 mL/min (by C-G formula based on SCr of 1.33 mg/dL).   Medical History: Past Medical History:  Diagnosis Date  . Acid reflux   . Asthma    Asthmatic COPD  . Atrial fibrillation ([place]) 01/01/2016  . Coronary artery disease    Echo 07/27/2011   . Coronary artery stenosis    , High-grade left main  . GERD (gastroesophageal reflux disease)   . Hyperlipidemia   . Hypertension   . Psoriasis     Medications:  Prescriptions Prior to Admission  Medication Sig Dispense Refill Last Dose  . amLODipine (NORVASC) 10 MG tablet Take 0.5 tablets (5 mg total) by mouth daily. 45 tablet 3 Past Week at Unknown time  . aspirin 81 MG tablet Take 81 mg by mouth daily. Take 2 tablets twice daily   Past Week at Unknown time  . donepezil (ARICEPT) 10 MG tablet Take 1 tablet (10 mg total) by mouth daily. 90 tablet 1 Past Week at Unknown time  . DULoxetine (CYMBALTA) 60 MG capsule Take 1 capsule by mouth daily. Reported on 08/05/2015  2 Past Week at Unknown time  . fluticasone (FLONASE) 50 MCG/ACT nasal spray USE 1 TO 2 SPRAYS IN EACH NOSTRIL ONCE DAILY FOR STUFFY NOSE OR DRAINAGE 16 g 2 Past Month at Unknown time  . furosemide (LASIX) 20 MG tablet Take 1 tablet (20 mg total) by mouth daily. 30 tablet 6 Past Week at Unknown time  . metoprolol (LOPRESSOR) 100 MG tablet Take 1 tablet (100 mg total) by mouth 2 (two) times daily. 180 tablet 3 Past  Week at Unknown time  . mirtazapine (REMERON) 15 MG tablet Take 15 mg by mouth at bedtime. Reported on 08/05/2015   Past Week at Unknown time  . omeprazole (PRILOSEC) 20 MG capsule Take 1 capsule (20 mg total) by mouth 2 (two) times daily before a meal. 60 capsule 5 Past Week at Unknown time  . quinapril (ACCUPRIL) 40 MG tablet Take 1 tablet (40 mg total) by mouth daily. 90 tablet 3 Past Week at Unknown time  . ranitidine (ZANTAC) 300 MG tablet Take 1 tablet (300 mg total) by mouth at bedtime. 90 tablet 3 Past Week at Unknown time  . simvastatin (ZOCOR) 20 MG tablet Take 1 tablet (20 mg total) by mouth at bedtime. 90 tablet 3 Past Week at Unknown time  . VENTOLIN HFA 108 (90 BASE) MCG/ACT inhaler    unknown  . etodolac (LODINE) 400 MG tablet Take 400 mg by mouth 2 (two) times daily.     unknown  . mometasone-formoterol (DULERA) 100-5 MCG/ACT AERO Inhale 2 puffs into the lungs 2 (two) times daily.   unknown    Assessment: 82 y.o. female with new onset Afib for Lovenox  Goal of Therapy:  Full anticoagulation with Lovenox  Monitor platelets by anticoagulation protocol: Yes   Plan:  Lovenox 60 mg SQ q24h F/U renal fxn.  Alberta Lenhard,  Bronson Curb 01/01/2016,4:40 AM

## 2016-01-01 NOTE — ED Notes (Signed)
Patient transported to CT 

## 2016-01-01 NOTE — H&P (Signed)
History and Physical    Tammy [name] [id number] DOB: 08/10/1930 DOA: 12/31/2015  PCP:  Melinda Crutch, MD Consultants:  Georgina Peer - cardiology; others but she can't remember them right now Patient coming from: home - lives alone  Chief Complaint: fall  HPI: Tammy [name] is a 82 y.o. female with medical history significant of *CAD, HLD, HTN, and COPD.  She lives at home alone.  Family last spoke with her Sunday and unable to reach until finally came to check on her today (daughter was out of town).  Police helped to break into the house and she was lying in the floor.  Minimal clothes on but was able to get up to get Depends.  Minimal PO intake.  No h/o afib.  Remote h/o CABG.  No c/o pain - no injuries from the fall.  Patient reports that her foot hit something and she lost her balance.  Denies chest pains, +palpitations intermittently.  Has been c/o not feeling well.  Had appointment with Dr. Georgina Peer last week and didn't go because she wasn't feeling well enough.   Daughter reports she has been deteriorating.  Is having problems with memory.  Still driving.  Has urinary and fecal incontinence which keep her mostly at home.    Drinks wine and beer daily.  Started drinking about age 59.  Binge drinking.  Most she ever drinks in a day is a couple bottles of wine.  Denies h/o withdrawal including DTs, seizures.  Does acknowledge that she has a problem with ETOH, thinks she can quit on her own.   ED Course: Diagnoses: Afib with RVR - Cardizem bolus and drip; treatment-dose Lovenox; Lopressor  Per Dr. Gilford Raid: Hr still in the 120s after cardizem bolus then drip.  So, I ordered lopressor 5 mg.  Pt has also had 2L of IVfs.  The pt's head Ct ok, so I ordered lovenox for the a.fib.  Pt will be d/w the hospitalists for admission.  After prolonged period of remaining in ER for SDU bed, was able to be tapered off Cardizem drip with HR in 80s consistently and so order changed to telemetry   Review of  Systems: As per HPI; otherwise 10 point review of systems reviewed and negative.   Ambulatory Status:  Walks without assistance  Past Medical History:  Diagnosis Date  . Acid reflux   . Asthma    Asthmatic COPD  . Atrial fibrillation ([place]) 01/01/2016  . Coronary artery disease    Echo 07/27/2011   . Coronary artery stenosis    , High-grade left main  . GERD (gastroesophageal reflux disease)   . Hyperlipidemia   . Hypertension   . Psoriasis     Past Surgical History:  Procedure Laterality Date  . CARDIAC SURGERY    . CORONARY ARTERY BYPASS GRAFT  1994   After catheterization showed 90% ostial left main stenosis  . DOPPLER ECHOCARDIOGRAPHY     Study showed mild to moderate MR with moderate TR, mild to moderate pulmonary hypertension with an ejection fraction greater that 55%.   [name] EYE SURGERY    . Nuclear Perfusion  10/2010, 11/15/2012   Showed normal perfusion, No Ischemia or Infarction, Normal EF  . Renal Scan Duplex  04/09/2012   Showing greater than 50% diameter reduction in the celiac artery and SMA.. Right proximal 275 and Mid 152.  [name] ROTATOR CUFF REPAIR     bilateral  . Sleep Study  12/28/2006   AHI during total sleep time (  3h 19 minutes) was 1.81/hr and during REM sleep at 17.78/hr. Mild sleep apnea during REM sleep.Oxygen staturated rate during REM and NREM was 93.0%.    Social History   Social History  . Marital status: Single    Spouse name: N/A  . Number of children: N/A  . Years of education: N/A   Occupational History  . Not on file.   Social History Main Topics  . Smoking status: Former Smoker    Quit date: 11/08/1962  . Smokeless tobacco: Never Used  . Alcohol use 0.0 oz/week     Comment: drinks wine/beer daily  . Drug use: No  . Sexual activity: Not on file   Other Topics Concern  . Not on file   Social History Narrative   Patient lives at home alone.    Patient is divorced.    Patient has one child.    Patient has a high school education.      Patient quit tobacco 40 years ago.    Patient drinks coffee and tea daily.     Allergies  Allergen Reactions  . Penicillins Rash and Hives    Family History  Problem Relation Age of Onset  . Heart disease Mother   . Hypertension Sister     Prior to Admission medications   Medication Sig Start Date End Date Taking? Authorizing Provider  amLODipine (NORVASC) 10 MG tablet Take 0.5 tablets (5 mg total) by mouth daily. 07/21/15  Yes [name], MD  aspirin 81 MG tablet Take 81 mg by mouth daily. Take 2 tablets twice daily   Yes Historical Provider, MD  donepezil (ARICEPT) 10 MG tablet Take 1 tablet (10 mg total) by mouth daily. 07/13/14  Yes Marcial Pacas, MD  DULoxetine (CYMBALTA) 60 MG capsule Take 1 capsule by mouth daily. Reported on 08/05/2015 09/13/14  Yes Historical Provider, MD  fluticasone (FLONASE) 50 MCG/ACT nasal spray USE 1 TO 2 SPRAYS IN EACH NOSTRIL ONCE DAILY FOR STUFFY NOSE OR DRAINAGE 07/18/15  Yes Jiles Prows, MD  furosemide (LASIX) 20 MG tablet Take 1 tablet (20 mg total) by mouth daily. 09/10/15  Yes [name], MD  metoprolol (LOPRESSOR) 100 MG tablet Take 1 tablet (100 mg total) by mouth 2 (two) times daily. 07/21/15  Yes [name], MD  mirtazapine (REMERON) 15 MG tablet Take 15 mg by mouth at bedtime. Reported on 08/05/2015   Yes Historical Provider, MD  omeprazole (PRILOSEC) 20 MG capsule Take 1 capsule (20 mg total) by mouth 2 (two) times daily before a meal. 08/05/15  Yes Jiles Prows, MD  quinapril (ACCUPRIL) 40 MG tablet Take 1 tablet (40 mg total) by mouth daily. 07/21/15  Yes [name], MD  ranitidine (ZANTAC) 300 MG tablet Take 1 tablet (300 mg total) by mouth at bedtime. 07/21/15  Yes [name], MD  simvastatin (ZOCOR) 20 MG tablet Take 1 tablet (20 mg total) by mouth at bedtime. 07/21/15  Yes [name], MD  VENTOLIN HFA 108 (90 BASE) MCG/ACT inhaler  11/27/12  Yes Historical Provider, MD  etodolac (LODINE) 400 MG tablet Take 400 mg by mouth 2  (two) times daily.      Historical Provider, MD  mometasone-formoterol (DULERA) 100-5 MCG/ACT AERO Inhale 2 puffs into the lungs 2 (two) times daily.    Historical Provider, MD    Physical Exam: Vitals:   01/01/16 0130 01/01/16 0145 01/01/16 0300 01/01/16 0421  BP: 123/56 (!) 118/21 (!) 114/41 124/60  Pulse: 82 80 86 90  Resp: 24 24 23  (!) 21  Temp:    98.3 F (36.8 C)  TempSrc:    Axillary  SpO2: 100% 100% 100% 95%  Weight:    62.2 kg (137 lb 2 oz)  Height:    5\' 2"  (1.575 m)     General:  Appears disheveled, face is dirty, is poor historian, but is in NAD Eyes:  PERRL, EOMI, normal lids, iris ENT:  grossly normal hearing, lips & tongue, mmm Neck:  no LAD, masses or thyromegaly Cardiovascular:  Afib with RVR, no m/r/g. No LE edema.  Respiratory:  CTA bilaterally, no w/r/r. Normal respiratory effort. Abdomen:  soft, ntnd, NABS Skin: scattered ecchymoses and abrasions seen on limited exam Musculoskeletal:  grossly normal tone BUE/BLE, good ROM, no bony abnormality Psychiatric:  grossly normal mood and affect, speech fluent but occasionally slurred, AOx2 Neurologic: CN 2-12 grossly intact, moves all extremities in coordinated fashion, sensation intact  Labs on Admission: I have personally reviewed following labs and imaging studies  CBC:  Recent Labs Lab 12/31/15 2042  WBC 13.0*  HGB 13.3  HCT 39.1  MCV 85.6  PLT 0000000   Basic Metabolic Panel:  Recent Labs Lab 12/31/15 2042  NA 129*  K 4.6  CL 93*  CO2 21*  GLUCOSE 116*  BUN 8  CREATININE 1.33*  CALCIUM 8.6*   GFR: Estimated Creatinine Clearance: 27.8 mL/min (by C-G formula based on SCr of 1.33 mg/dL). Liver Function Tests: No results for input(s): AST, ALT, ALKPHOS, BILITOT, PROT, ALBUMIN in the last 168 hours. No results for input(s): LIPASE, AMYLASE in the last 168 hours. No results for input(s): AMMONIA in the last 168 hours. Coagulation Profile: No results for input(s): INR, PROTIME in the last 168  hours. Cardiac Enzymes:  Recent Labs Lab 12/31/15 2042  CKTOTAL 195   BNP (last 3 results) No results for input(s): PROBNP in the last 8760 hours. HbA1C: No results for input(s): HGBA1C in the last 72 hours. CBG: No results for input(s): GLUCAP in the last 168 hours. Lipid Profile: No results for input(s): CHOL, HDL, LDLCALC, TRIG, CHOLHDL, LDLDIRECT in the last 72 hours. Thyroid Function Tests: No results for input(s): TSH, T4TOTAL, FREET4, T3FREE, THYROIDAB in the last 72 hours. Anemia Panel: No results for input(s): VITAMINB12, FOLATE, FERRITIN, TIBC, IRON, RETICCTPCT in the last 72 hours. Urine analysis:    Component Value Date/Time   COLORURINE YELLOW 12/31/2015 2355   APPEARANCEUR CLEAR 12/31/2015 2355   LABSPEC 1.015 12/31/2015 2355   PHURINE 5.5 12/31/2015 2355   GLUCOSEU NEGATIVE 12/31/2015 2355   HGBUR NEGATIVE 12/31/2015 2355   BILIRUBINUR SMALL (A) 12/31/2015 2355   KETONESUR 15 (A) 12/31/2015 2355   PROTEINUR NEGATIVE 12/31/2015 2355   UROBILINOGEN 0.2 07/11/2009 2220   NITRITE NEGATIVE 12/31/2015 2355   LEUKOCYTESUR NEGATIVE 12/31/2015 2355    Creatinine Clearance: Estimated Creatinine Clearance: 27.8 mL/min (by C-G formula based on SCr of 1.33 mg/dL).  Sepsis Labs: @LABRCNTIP (procalcitonin:4,lacticidven:4) )No results found for this or any previous visit (from the past 240 hour(s)).   Radiological Exams on Admission: Dg Chest 2 View  Result Date: 12/31/2015 CLINICAL DATA:  82 year old female with shortness of breath. atrial fibrillation. Fall 3 days ago. Initial encounter. EXAM: CHEST  2 VIEW COMPARISON:  07/11/2009 and earlier. FINDINGS: Semi upright AP and lateral views of the chest. Increase cardiomegaly. Stable sequelae of CABG. No pneumothorax. No pulmonary edema or pleural effusion. No confluent pulmonary opacity. Eventration of the diaphragm again noted.  New line osteopenia. Visualized tracheal air column is within normal limits. No acute osseous  abnormality identified. IMPRESSION: No acute cardiopulmonary abnormality. Cardiomegaly has progressed since 2011. Calcified aortic atherosclerosis. Electronically Signed   By: Genevie Ann M.D.   On: 12/31/2015 21:29   Ct Head Wo Contrast  Result Date: 12/31/2015 CLINICAL DATA:  Status post fall. May have been on the ground for 2 days. Initial encounter. EXAM: CT HEAD WITHOUT CONTRAST TECHNIQUE: Contiguous axial images were obtained from the base of the skull through the vertex without intravenous contrast. COMPARISON:  CT of the head performed 07/11/2009 FINDINGS: Brain: There is no evidence of acute infarction, mass lesion, or intra- or extra-axial hemorrhage on CT. Prominence of the ventricles and sulci reflects moderate cortical volume loss. Mild cerebellar atrophy is noted. A chronic lacunar infarct is noted at the right cerebellar hemisphere. The brainstem and fourth ventricle are within normal limits. The basal ganglia are unremarkable in appearance. The cerebral hemispheres demonstrate grossly normal gray-white differentiation. No mass effect or midline shift is seen. Vascular: No hyperdense vessel or unexpected calcification. Skull: There is no evidence of fracture; visualized osseous structures are unremarkable in appearance. Sinuses/Orbits: The visualized portions of the orbits are within normal limits. The paranasal sinuses and mastoid air cells are well-aerated. Other: Mild soft tissue swelling is noted lateral to the left orbit. IMPRESSION: 1. No acute intracranial pathology seen on CT. 2. Mild soft tissue swelling lateral to the left orbit. 3. Moderate cortical volume loss noted. 4. Chronic lacunar infarct at the right cerebellar hemisphere. Electronically Signed   By: Garald Balding M.D.   On: 12/31/2015 23:48   Ct Angio Chest Pe W And/or Wo Contrast  Result Date: 01/01/2016 CLINICAL DATA:  82 y/o F; shortness of breath and positive D-dimer. History of atrial fibrillation, hypertension, and  coronary artery disease. EXAM: CT ANGIOGRAPHY CHEST WITH CONTRAST TECHNIQUE: Multidetector CT imaging of the chest was performed using the standard protocol during bolus administration of intravenous contrast. Multiplanar CT image reconstructions and MIPs were obtained to evaluate the vascular anatomy. CONTRAST:  100 cc Isovue 370. COMPARISON:  Chest radiograph dated 12/31/2015. Chest CT dated 7/8/8. FINDINGS: Mediastinum/Lymph Nodes: No pulmonary emboli or thoracic aortic dissection identified. No masses or pathologically enlarged lymph nodes identified. Cardiac: Mild coronary artery calcifications. Postsurgical changes related to a LIMA bypass graft and saphenous graft. Healed median sternotomy. No cardiomegaly. No pericardial effusion. Lungs/Pleura: No pulmonary mass, infiltrate, or effusion. Mild centrilobular emphysema with upper lobe predominance. Upper abdomen: Gallstones.  No acute findings. Musculoskeletal: No chest wall mass or suspicious bone lesions identified. Chronic left anterior rib fracture deformities. Multilevel degenerative changes of the spine. Flowing anterior ossification of multiple thoracic vertebral bodies compatible with DISH. Review of the MIP images confirms the above findings. IMPRESSION: 1. No pulmonary embolus is identified.  No acute pulmonary process. 2. Mild emphysema. 3. Gallstones. Electronically Signed   By: Kristine Garbe M.D.   On: 01/01/2016 03:06    EKG: Independently reviewed.  Afib with RVR with rate 141;no evidence of acute ischemia  Assessment/Plan Principal Problem:   Atrial fibrillation with RVR (HCC) Active Problems:   Hyperlipidemia with target LDL less than 70   Essential hypertension   Memory loss   Alcohol dependence ([place])   Falls   New-onset afib with RVR -Patient initially required a Diltiazem drip as well as Lopressor pushes in ER but was eventually able to be weaned off and finally converted back to NSR -Will continue Lopressor PO as  well as pushes prn -Will defer to day team about whether to start Cardizem PO now that she is converted -Continue treatment-dose Lovenox for now -Her CHA2DS2-VASc Score is 4, indicating a 4%/yr stroke rate -She remains a very high fall risk, particularly if she continues drinking -May benefit from an Echocardiogram  Falls -Surprisingly, the patient did not have rhabdomyolysis despite being mostly unable to move for the last 2-3 days -She has frequent falls -This is likely multifactorial but very likely related in large part to her dementia and alcohol use  Alcohol dependence -By her description, this is more binge drinking than daily -However, she is capable of consuming large amounts of ETOH - up to several bottles of wine daily -Will place on CIWA protocol and give thiamine/folate -She is not anemic and does not have macrocytosis so will not check thiamine and folate levels at this time -Will place SW consult for substance abuse -Check UDS  Memory loss -May be related to ETOH but Alzheimer's is also a consideration -It may no longer be safe for patient to be driving -She may benefit from neuropsych testing -She may require placement, as with falls and ETOH, it may no longer be safe for her to live at home alone  HTN -Hold Norvasc - patient may need Diltiazem instead -Continue Lopressor, Accupril  HLD -With h/o CAD, LDL goal is <70 -Continue Zocor for now but may need Lipitor or Crestor in the future if unable to meet LDL goal on Zocor (still has room, as she is only taking 20 mg daily)   DVT prophylaxis: Treatment- dose Lovenox Code Status: Full - confirmed with patient/family Family Communication: Daughter present throughout evaluation  Disposition Plan:  To be determined Consults called: SW  Admission status: Admit - initially to SDU but changed to telemetry    Karmen Bongo MD [facility]  If 7PM-7AM, please contact night-coverage [email or web address] Password  [facility]  01/01/2016, 6:58 AM

## 2016-01-02 LAB — CBC
HCT: 34.3 % — ABNORMAL LOW (ref 36.0–46.0)
Hemoglobin: 11 g/dL — ABNORMAL LOW (ref 12.0–15.0)
MCH: 28.6 pg (ref 26.0–34.0)
MCHC: 32.1 g/dL (ref 30.0–36.0)
MCV: 89.1 fL (ref 78.0–100.0)
Platelets: 237 10*3/uL (ref 150–400)
RBC: 3.85 MIL/uL — ABNORMAL LOW (ref 3.87–5.11)
RDW: 14.4 % (ref 11.5–15.5)
WBC: 6.7 10*3/uL (ref 4.0–10.5)

## 2016-01-02 LAB — BASIC METABOLIC PANEL
Anion gap: 7 (ref 5–15)
BUN: 7 mg/dL (ref 6–20)
CO2: 27 mmol/L (ref 22–32)
Calcium: 8.4 mg/dL — ABNORMAL LOW (ref 8.9–10.3)
Chloride: 101 mmol/L (ref 101–111)
Creatinine, Ser: 1.02 mg/dL — ABNORMAL HIGH (ref 0.44–1.00)
GFR calc Af Amer: 57 mL/min — ABNORMAL LOW (ref >60)
GFR calc non Af Amer: 49 mL/min — ABNORMAL LOW (ref >60)
Glucose, Bld: 90 mg/dL (ref 65–99)
Potassium: 4.2 mmol/L (ref 3.5–5.1)
Sodium: 135 mmol/L (ref 135–145)

## 2016-01-02 LAB — MYOGLOBIN, SERUM: Myoglobin: 276 ng/mL — ABNORMAL HIGH (ref 25–58)

## 2016-01-02 MED ORDER — ASPIRIN EC 81 MG PO TBEC
81.0000 mg | DELAYED_RELEASE_TABLET | Freq: Two times a day (BID) | ORAL | Status: DC
  Administered 2016-01-02 – 2016-01-06 (×9): 81 mg via ORAL
  Filled 2016-01-02 (×9): qty 1

## 2016-01-02 MED ORDER — DILTIAZEM HCL ER COATED BEADS 120 MG PO CP24
120.0000 mg | ORAL_CAPSULE | Freq: Every day | ORAL | Status: DC
  Administered 2016-01-02 – 2016-01-06 (×5): 120 mg via ORAL
  Filled 2016-01-02 (×7): qty 1

## 2016-01-02 MED ORDER — ATORVASTATIN CALCIUM 10 MG PO TABS
10.0000 mg | ORAL_TABLET | Freq: Every day | ORAL | Status: DC
  Administered 2016-01-02 – 2016-01-05 (×4): 10 mg via ORAL
  Filled 2016-01-02 (×4): qty 1

## 2016-01-02 MED ORDER — ENOXAPARIN SODIUM 60 MG/0.6ML ~~LOC~~ SOLN
60.0000 mg | Freq: Two times a day (BID) | SUBCUTANEOUS | Status: DC
  Administered 2016-01-02 – 2016-01-04 (×5): 60 mg via SUBCUTANEOUS
  Filled 2016-01-02 (×4): qty 0.6

## 2016-01-02 NOTE — Progress Notes (Signed)
Subjective:  Feels better  Objective:   Vital Signs : Vitals:   01/01/16 2011 01/02/16 0025 01/02/16 0507 01/02/16 0833  BP:  (!) 108/47 127/60   Pulse:  (!) 58 69   Resp:  18 16   Temp:  98.4 F (36.9 C) 98.2 F (36.8 C)   TempSrc:  Oral Oral   SpO2: 94% 96% 92% 93%  Weight:      Height:        Intake/Output from previous day:  Intake/Output Summary (Last 24 hours) at 01/02/16 0936 Last data filed at 01/01/16 1310  Gross per 24 hour  Intake              460 ml  Output                1 ml  Net              459 ml    I/O since admission: +459  Wt Readings from Last 3 Encounters:  01/01/16 137 lb 2 oz (62.2 kg)  09/10/15 141 lb 1 oz (64 kg)  08/05/15 132 lb (59.9 kg)    Medications: . aspirin EC  162 mg Oral BID  . diltiazem  30 mg Oral Q8H  . donepezil  10 mg Oral Daily  . DULoxetine  60 mg Oral Daily  . enoxaparin (LOVENOX) injection  60 mg Subcutaneous Daily  . famotidine  40 mg Oral QHS  . folic acid  1 mg Oral Daily  . LORazepam  0-4 mg Intravenous Q6H   Followed by  . [START ON 01/03/2016] LORazepam  0-4 mg Intravenous Q12H  . metoprolol  100 mg Oral BID  . mirtazapine  15 mg Oral QHS  . mometasone-formoterol  2 puff Inhalation BID  . multivitamin with minerals  1 tablet Oral Daily  . pantoprazole  40 mg Oral Daily  . quinapril  40 mg Oral Daily  . simvastatin  20 mg Oral QHS  . thiamine  100 mg Oral Daily   Or  . thiamine  100 mg Intravenous Daily       Physical Exam:   General appearance: alert, cooperative and no distress Neck: no adenopathy, no JVD, supple, symmetrical, trachea midline and thyroid not enlarged, symmetric, no tenderness/mass/nodules Lungs: clear to auscultation bilaterally Heart: RRR in the 80's; 1/6 sem Abdomen: soft, non-tender; bowel sounds normal; no masses,  no organomegaly Extremities: no edema, redness or tenderness in the calves or thighs Neurologic: nonfocal but mild tremor   Rate: 75-88  Rhythm: normal  sinus rhythm  ECG (independently read by me):  Lab Results:   Recent Labs  12/31/15 2042 01/02/16 0450  NA 129* 135  K 4.6 4.2  CL 93* 101  CO2 21* 27  GLUCOSE 116* 90  BUN 8 7  CREATININE 1.33* 1.02*  CALCIUM 8.6* 8.4*    Hepatic Function Latest Ref Rng & Units 11/02/2013 11/09/2012 11/22/2006  Total Protein 6.0 - 8.3 g/dL 7.0 6.6 6.1  Albumin 3.5 - 5.2 g/dL 4.1 4.2 2.6(L)  AST 0 - 37 U/L 18 19 51(H)  ALT 0 - 35 U/L 15 13 48(H)  Alk Phosphatase 39 - 117 U/L 66 59 83  Total Bilirubin 0.2 - 1.2 mg/dL 0.6 0.3 1.0     Recent Labs  12/31/15 2042 01/02/16 0450  WBC 13.0* 6.7  HGB 13.3 11.0*  HCT 39.1 34.3*  MCV 85.6 89.1  PLT 293 237    No results for input(s): TROPONINI in  the last 72 hours.  Invalid input(s): CK, MB  Lab Results  Component Value Date   TSH 0.917 11/02/2013   No results for input(s): HGBA1C in the last 72 hours.  No results for input(s): PROT, ALBUMIN, AST, ALT, ALKPHOS, BILITOT, BILIDIR, IBILI in the last 72 hours. No results for input(s): INR in the last 72 hours. BNP (last 3 results) No results for input(s): BNP in the last 8760 hours.  ProBNP (last 3 results) No results for input(s): PROBNP in the last 8760 hours.   Lipid Panel     Component Value Date/Time   CHOL 176 11/02/2013 0956   CHOL 128 11/09/2012 0805   TRIG 151 (H) 11/02/2013 0956   TRIG 84 11/09/2012 0805   HDL 64 11/02/2013 0956   HDL 50 11/09/2012 0805   CHOLHDL 2.8 11/02/2013 0956   VLDL 30 11/02/2013 0956   LDLCALC 82 11/02/2013 0956   LDLCALC 61 11/09/2012 0805      Imaging:  Dg Chest 2 View  Result Date: 12/31/2015 CLINICAL DATA:  82 year old female with shortness of breath. atrial fibrillation. Fall 3 days ago. Initial encounter. EXAM: CHEST  2 VIEW COMPARISON:  07/11/2009 and earlier. FINDINGS: Semi upright AP and lateral views of the chest. Increase cardiomegaly. Stable sequelae of CABG. No pneumothorax. No pulmonary edema or pleural effusion. No  confluent pulmonary opacity. Eventration of the diaphragm again noted. New line osteopenia. Visualized tracheal air column is within normal limits. No acute osseous abnormality identified. IMPRESSION: No acute cardiopulmonary abnormality. Cardiomegaly has progressed since 2011. Calcified aortic atherosclerosis. Electronically Signed   By: Genevie Ann M.D.   On: 12/31/2015 21:29   Ct Head Wo Contrast  Result Date: 12/31/2015 CLINICAL DATA:  Status post fall. May have been on the ground for 2 days. Initial encounter. EXAM: CT HEAD WITHOUT CONTRAST TECHNIQUE: Contiguous axial images were obtained from the base of the skull through the vertex without intravenous contrast. COMPARISON:  CT of the head performed 07/11/2009 FINDINGS: Brain: There is no evidence of acute infarction, mass lesion, or intra- or extra-axial hemorrhage on CT. Prominence of the ventricles and sulci reflects moderate cortical volume loss. Mild cerebellar atrophy is noted. A chronic lacunar infarct is noted at the right cerebellar hemisphere. The brainstem and fourth ventricle are within normal limits. The basal ganglia are unremarkable in appearance. The cerebral hemispheres demonstrate grossly normal gray-white differentiation. No mass effect or midline shift is seen. Vascular: No hyperdense vessel or unexpected calcification. Skull: There is no evidence of fracture; visualized osseous structures are unremarkable in appearance. Sinuses/Orbits: The visualized portions of the orbits are within normal limits. The paranasal sinuses and mastoid air cells are well-aerated. Other: Mild soft tissue swelling is noted lateral to the left orbit. IMPRESSION: 1. No acute intracranial pathology seen on CT. 2. Mild soft tissue swelling lateral to the left orbit. 3. Moderate cortical volume loss noted. 4. Chronic lacunar infarct at the right cerebellar hemisphere. Electronically Signed   By: Garald Balding M.D.   On: 12/31/2015 23:48   Ct Angio Chest Pe W  And/or Wo Contrast  Result Date: 01/01/2016 CLINICAL DATA:  82 y/o F; shortness of breath and positive D-dimer. History of atrial fibrillation, hypertension, and coronary artery disease. EXAM: CT ANGIOGRAPHY CHEST WITH CONTRAST TECHNIQUE: Multidetector CT imaging of the chest was performed using the standard protocol during bolus administration of intravenous contrast. Multiplanar CT image reconstructions and MIPs were obtained to evaluate the vascular anatomy. CONTRAST:  100 cc Isovue 370. COMPARISON:  Chest radiograph dated 12/31/2015. Chest CT dated 7/8/8. FINDINGS: Mediastinum/Lymph Nodes: No pulmonary emboli or thoracic aortic dissection identified. No masses or pathologically enlarged lymph nodes identified. Cardiac: Mild coronary artery calcifications. Postsurgical changes related to a LIMA bypass graft and saphenous graft. Healed median sternotomy. No cardiomegaly. No pericardial effusion. Lungs/Pleura: No pulmonary mass, infiltrate, or effusion. Mild centrilobular emphysema with upper lobe predominance. Upper abdomen: Gallstones.  No acute findings. Musculoskeletal: No chest wall mass or suspicious bone lesions identified. Chronic left anterior rib fracture deformities. Multilevel degenerative changes of the spine. Flowing anterior ossification of multiple thoracic vertebral bodies compatible with DISH. Review of the MIP images confirms the above findings. IMPRESSION: 1. No pulmonary embolus is identified.  No acute pulmonary process. 2. Mild emphysema. 3. Gallstones. Electronically Signed   By: Kristine Garbe M.D.   On: 01/01/2016 03:06      Assessment/Plan:   Principal Problem:   Atrial fibrillation with RVR (HCC) Active Problems:   Hyperlipidemia with target LDL less than 70   Essential hypertension   Memory loss   Alcohol dependence with withdrawal with complication ([place])   Fall   AKI (acute kidney injury) ([place])   Dehydration  1. PAF; now in sinus rhtyhm on metoprolol 100  mg bid; amlodipine dc'd and cardizem started; will change to cd 120 mg daily 2. Mild d-dimer elevation: CTA neg for PE 3. CAD: s/p CABG 1994; no angina; last nuclear study 2016 was normal 4. S/p fall 5. ETOH abuse; monitor for withdrawal symptoms 6. Hyperlipidemia: f/u lfts and lipid panel on simvastatin    [name], MD, [facility] 01/02/2016, 9:36 AM

## 2016-01-02 NOTE — Progress Notes (Signed)
[facility]                                                                              Patient Demographics  Tammy [name], is a 82 y.o. female, DOB - [birth year], [id number]  Admit date - 12/31/2015   Admitting Physician Karmen Bongo, MD  Outpatient Primary MD for the patient is  Melinda Crutch, MD  Outpatient specialists:   LOS - 1  days    Chief Complaint  Patient presents with  . Atrial Fibrillation  . Fall       Brief summary   Tammy [name] is a 82 y.o. female with medical history significant of *CAD, HLD, HTN, and COPD.  She lives at home alone.  Family last spoke with her Sunday and unable to reach until finally came to check on her today (daughter was out of town).  Police helped to break into the house and she was lying in the floor.  Minimal clothes on but was able to get up to get Depends.  Minimal PO intake.  No h/o afib.  Remote h/o CABG.  No c/o pain - no injuries from the fall.  Patient reports that her foot hit something and she lost her balance.  Denies chest pains, +palpitations intermittently.  Has been c/o not feeling well.  Had appointment with Dr. Georgina Peer last week and didn't go because she wasn't feeling well enough.  Daughter reported she has been deteriorating.  Is having problems with memory.  Still driving.  Has urinary and fecal incontinence which keep her mostly at home.   Drinks wine and beer daily.  Started drinking about age 63.  Binge drinking.  Most she ever drinks in a day is a couple bottles of wine.  Denies h/o withdrawal including DTs, seizures.  Does acknowledge that she has a problem with ETOH, thinks she can quit on her own. ED Course: Diagnoses: Afib with RVR - Cardizem bolus and drip; treatment-dose Lovenox; Lopressor    Assessment & Plan   Paroxysmal New-onset afib with RVR - Currently rate controlled and in normal sinus rhythm -Patient initially required a Diltiazem drip as well as Lopressor pushes in ER but was  eventually able to be weaned off and finally converted back to NSR. - Per cardiology, continue metoprolol, amlodipine discontinued. Cardizem started - CHA2DS2-VASc Score is 5, cont lovenox, anticoagulation per cardiology -She remains a very high fall risk, particularly if she continues drinking, Hence not on any anticoagulation - TSH normal, CTA neg for PE  Falls -No rhabdomyolysis despite being mostly unable to move for the last 2-3 days, has frequent falls - likely multifactorial but very likely related in large part to her dementia and alcohol use  Alcohol dependence with acute withdrawls -By her description, this is more binge drinking than daily, she is capable of consuming large amounts of ETOH - up to several bottles of wine daily - placed on scheduled ativan with CIWA protocol and thiamine/folate - SW consult for substance abuse2. UDS negative  Memory loss -May be related to ETOH but Alzheimer's is also a consideration -She may require placement, as with falls and ETOH,  may no longer be safe for her to live at home alone, PT eval pending   HTN - controlled -Continue Lopressor, Accupril  HLD -With h/o CAD, LDL goal is <70 - Continue statin   Code Status: full DVT Prophylaxis:  Lovenox  Family Communication: Discussed in detail with the patient, all imaging results, lab results explained to the patient   Disposition Plan:   Time Spent in minutes  25 minutes  Procedures:    Consultants:   cardiology  Antimicrobials:      Medications  Scheduled Meds: . aspirin EC  81 mg Oral BID  . diltiazem  120 mg Oral Daily  . donepezil  10 mg Oral Daily  . DULoxetine  60 mg Oral Daily  . enoxaparin (LOVENOX) injection  60 mg Subcutaneous Daily  . famotidine  40 mg Oral QHS  . folic acid  1 mg Oral Daily  . LORazepam  0-4 mg Intravenous Q6H   Followed by  . [START ON 01/03/2016] LORazepam  0-4 mg Intravenous Q12H  . metoprolol  100 mg Oral BID  . mirtazapine  15  mg Oral QHS  . mometasone-formoterol  2 puff Inhalation BID  . multivitamin with minerals  1 tablet Oral Daily  . pantoprazole  40 mg Oral Daily  . quinapril  40 mg Oral Daily  . simvastatin  20 mg Oral QHS  . thiamine  100 mg Oral Daily   Or  . thiamine  100 mg Intravenous Daily   Continuous Infusions:  PRN Meds:.acetaminophen, LORazepam **OR** LORazepam, ondansetron (ZOFRAN) IV   Antibiotics   Anti-infectives    None        Subjective:   Leota Pettijohn was seen and examined today. Rate controlled, Still somewhat shaky. Patient denies dizziness, chest pain, shortness of breath, abdominal pain, N/V/D/C, new weakness, numbess, tingling. No acute events overnight.   Objective:   Vitals:   01/01/16 2011 01/02/16 0025 01/02/16 0507 01/02/16 0833  BP:  (!) 108/47 127/60   Pulse:  (!) 58 69   Resp:  18 16   Temp:  98.4 F (36.9 C) 98.2 F (36.8 C)   TempSrc:  Oral Oral   SpO2: 94% 96% 92% 93%  Weight:      Height:        Intake/Output Summary (Last 24 hours) at 01/02/16 1235 Last data filed at 01/02/16 [id number]  Gross per 24 hour  Intake              340 ml  Output                0 ml  Net              340 ml     Wt Readings from Last 3 Encounters:  01/01/16 62.2 kg (137 lb 2 oz)  09/10/15 64 kg (141 lb 1 oz)  08/05/15 59.9 kg (132 lb)     Exam  General: Alert and oriented x 3, NAD  HEENT:    Neck: Supple, no JVD  Cardiovascular: S1 S2 clear, RRR  Respiratory: Clear to auscultation bilaterally, no wheezing, rales or rhonchi  Gastrointestinal: Soft, nontender, nondistended, + bowel sounds  Ext: no cyanosis clubbing or edema, tremulous  improving   Neuro:no new deficits   Skin: No rashes  Psych: Normal affect and demeanor, alert and oriented x3    Data Reviewed:  I have personally reviewed following labs and imaging studies  Micro Results No results found for this or any  previous visit (from the past 240 hour(s)).  Radiology Reports Dg Chest 2  View  Result Date: 12/31/2015 CLINICAL DATA:  82 year old female with shortness of breath. atrial fibrillation. Fall 3 days ago. Initial encounter. EXAM: CHEST  2 VIEW COMPARISON:  07/11/2009 and earlier. FINDINGS: Semi upright AP and lateral views of the chest. Increase cardiomegaly. Stable sequelae of CABG. No pneumothorax. No pulmonary edema or pleural effusion. No confluent pulmonary opacity. Eventration of the diaphragm again noted. New line osteopenia. Visualized tracheal air column is within normal limits. No acute osseous abnormality identified. IMPRESSION: No acute cardiopulmonary abnormality. Cardiomegaly has progressed since 2011. Calcified aortic atherosclerosis. Electronically Signed   By: Genevie Ann M.D.   On: 12/31/2015 21:29   Ct Head Wo Contrast  Result Date: 12/31/2015 CLINICAL DATA:  Status post fall. May have been on the ground for 2 days. Initial encounter. EXAM: CT HEAD WITHOUT CONTRAST TECHNIQUE: Contiguous axial images were obtained from the base of the skull through the vertex without intravenous contrast. COMPARISON:  CT of the head performed 07/11/2009 FINDINGS: Brain: There is no evidence of acute infarction, mass lesion, or intra- or extra-axial hemorrhage on CT. Prominence of the ventricles and sulci reflects moderate cortical volume loss. Mild cerebellar atrophy is noted. A chronic lacunar infarct is noted at the right cerebellar hemisphere. The brainstem and fourth ventricle are within normal limits. The basal ganglia are unremarkable in appearance. The cerebral hemispheres demonstrate grossly normal gray-white differentiation. No mass effect or midline shift is seen. Vascular: No hyperdense vessel or unexpected calcification. Skull: There is no evidence of fracture; visualized osseous structures are unremarkable in appearance. Sinuses/Orbits: The visualized portions of the orbits are within normal limits. The paranasal sinuses and mastoid air cells are well-aerated. Other: Mild  soft tissue swelling is noted lateral to the left orbit. IMPRESSION: 1. No acute intracranial pathology seen on CT. 2. Mild soft tissue swelling lateral to the left orbit. 3. Moderate cortical volume loss noted. 4. Chronic lacunar infarct at the right cerebellar hemisphere. Electronically Signed   By: Garald Balding M.D.   On: 12/31/2015 23:48   Ct Angio Chest Pe W And/or Wo Contrast  Result Date: 01/01/2016 CLINICAL DATA:  82 y/o F; shortness of breath and positive D-dimer. History of atrial fibrillation, hypertension, and coronary artery disease. EXAM: CT ANGIOGRAPHY CHEST WITH CONTRAST TECHNIQUE: Multidetector CT imaging of the chest was performed using the standard protocol during bolus administration of intravenous contrast. Multiplanar CT image reconstructions and MIPs were obtained to evaluate the vascular anatomy. CONTRAST:  100 cc Isovue 370. COMPARISON:  Chest radiograph dated 12/31/2015. Chest CT dated 7/8/8. FINDINGS: Mediastinum/Lymph Nodes: No pulmonary emboli or thoracic aortic dissection identified. No masses or pathologically enlarged lymph nodes identified. Cardiac: Mild coronary artery calcifications. Postsurgical changes related to a LIMA bypass graft and saphenous graft. Healed median sternotomy. No cardiomegaly. No pericardial effusion. Lungs/Pleura: No pulmonary mass, infiltrate, or effusion. Mild centrilobular emphysema with upper lobe predominance. Upper abdomen: Gallstones.  No acute findings. Musculoskeletal: No chest wall mass or suspicious bone lesions identified. Chronic left anterior rib fracture deformities. Multilevel degenerative changes of the spine. Flowing anterior ossification of multiple thoracic vertebral bodies compatible with DISH. Review of the MIP images confirms the above findings. IMPRESSION: 1. No pulmonary embolus is identified.  No acute pulmonary process. 2. Mild emphysema. 3. Gallstones. Electronically Signed   By: Kristine Garbe M.D.   On: 01/01/2016  03:06    Lab Data:  CBC:  Recent Labs Lab 12/31/15 2042  01/02/16 0450  WBC 13.0* 6.7  HGB 13.3 11.0*  HCT 39.1 34.3*  MCV 85.6 89.1  PLT 293 [id number]   Basic Metabolic Panel:  Recent Labs Lab 12/31/15 2042 01/02/16 0450  NA 129* 135  K 4.6 4.2  CL 93* 101  CO2 21* 27  GLUCOSE 116* 90  BUN 8 7  CREATININE 1.33* 1.02*  CALCIUM 8.6* 8.4*   GFR: Estimated Creatinine Clearance: 36.2 mL/min (by C-G formula based on SCr of 1.02 mg/dL). Liver Function Tests: No results for input(s): AST, ALT, ALKPHOS, BILITOT, PROT, ALBUMIN in the last 168 hours. No results for input(s): LIPASE, AMYLASE in the last 168 hours. No results for input(s): AMMONIA in the last 168 hours. Coagulation Profile: No results for input(s): INR, PROTIME in the last 168 hours. Cardiac Enzymes:  Recent Labs Lab 12/31/15 2042  CKTOTAL 195   BNP (last 3 results) No results for input(s): PROBNP in the last 8760 hours. HbA1C: No results for input(s): HGBA1C in the last 72 hours. CBG: No results for input(s): GLUCAP in the last 168 hours. Lipid Profile: No results for input(s): CHOL, HDL, LDLCALC, TRIG, CHOLHDL, LDLDIRECT in the last 72 hours. Thyroid Function Tests: No results for input(s): TSH, T4TOTAL, FREET4, T3FREE, THYROIDAB in the last 72 hours. Anemia Panel: No results for input(s): VITAMINB12, FOLATE, FERRITIN, TIBC, IRON, RETICCTPCT in the last 72 hours. Urine analysis:    Component Value Date/Time   COLORURINE YELLOW 12/31/2015 2355   APPEARANCEUR CLEAR 12/31/2015 2355   LABSPEC 1.015 12/31/2015 2355   PHURINE 5.5 12/31/2015 2355   GLUCOSEU NEGATIVE 12/31/2015 2355   HGBUR NEGATIVE 12/31/2015 2355   BILIRUBINUR SMALL (A) 12/31/2015 2355   KETONESUR 15 (A) 12/31/2015 2355   PROTEINUR NEGATIVE 12/31/2015 2355   UROBILINOGEN 0.2 07/11/2009 2220   NITRITE NEGATIVE 12/31/2015 2355   LEUKOCYTESUR NEGATIVE 12/31/2015 2355     Maryah Marinaro M.D. [facility] 01/02/2016, 12:35  PM  Pager: [phone] Between 7am to 7pm - call Pager - [phone]  After 7pm go to [email or web address] - password [email or web address]  Call night coverage person covering after 7pm

## 2016-01-02 NOTE — Progress Notes (Signed)
ANTICOAGULATION CONSULT NOTE - Follow Up Consult  Pharmacy Consult for enoxaparin Indication: atrial fibrillation  Allergies  Allergen Reactions  . Penicillins Rash and Hives    Patient Measurements: Height: 5\' 2"  (157.5 cm) Weight: 137 lb 2 oz (62.2 kg) IBW/kg (Calculated) : 50.1   Vital Signs: Temp: 97.6 F (36.4 C) (08/18 1344) Temp Source: Oral (08/18 1344) BP: 108/46 (08/18 1344) Pulse Rate: 63 (08/18 1344)  Labs:  Recent Labs  12/31/15 2042 01/02/16 0450  HGB 13.3 11.0*  HCT 39.1 34.3*  PLT 293 237  CREATININE 1.33* 1.02*  CKTOTAL 195  --     Estimated Creatinine Clearance: 36.2 mL/min (by C-G formula based on SCr of 1.02 mg/dL).  Assessment: 82 yo f presenting s/p fall and new onset afib  PMH: CAD, HLD, HTN, COPD, etoh abuse  Anticoagulation: lovenox for new PAF. No AC noted pta. Chads2vasc 4, ETOH abuse w/ high fall risk per MD note.  CTA neg for PE  Nephrology: SCr 1.02  Heme: H&H 11/34.3, Plt 237   Plan:  Increase enox 1 mg/kg to q12h Monitor renal fx Monitor CBC at least q72h, s/sx bleeding F/u long-term [facility] plan  Levester Fresh, PharmD, BCPS, [facility] Clinical Pharmacist Pager [phone] 01/02/2016 1:50 PM

## 2016-01-02 NOTE — Evaluation (Signed)
Physical Therapy Evaluation Patient Details Name: Tammy [name] MRN: [id number] DOB: Apr 19, 1931 Today's Date: 01/02/2016   History of Present Illness  Pt adm after fall at home and lying on floor for several days. Pt found to have afib with rvr. Pt also with ETOH abuse and possible dementia. PMH - HTN, CAD, COPD  Clinical Impression  Pt admitted with above diagnosis and presents to PT with functional limitations due to deficits listed below (See PT problem list). Pt needs skilled PT to maximize independence and safety to allow discharge to SNF. Pt unable to mobilize on her own and she lives home alone. Expect steady progress toward more independent mobility.     Follow Up Recommendations SNF    Equipment Recommendations  Other (comment) (to be assessed)    Recommendations for Other Services       Precautions / Restrictions Precautions Precautions: Fall Restrictions Weight Bearing Restrictions: No      Mobility  Bed Mobility Overal bed mobility: Needs Assistance Bed Mobility: Supine to Sit     Supine to sit: Mod assist     General bed mobility comments: Assist to bring legs off of bed and to elevate trunk into sitting and bring hips to EOB.  Transfers Overall transfer level: Needs assistance Equipment used: 4-wheeled walker;1 person hand held assist Transfers: Sit to/from [facility] Sit to Stand: Mod assist Stand pivot transfers: Mod assist       General transfer comment: Assist to bring hips up and for balance. Assist to pivot feet to chair.  Ambulation/Gait Ambulation/Gait assistance: Mod assist Ambulation Distance (Feet): 1 Feet Assistive device: 4-wheeled walker Gait Pattern/deviations: Step-to pattern;Decreased step length - right;Decreased step length - left;Shuffle;Trunk flexed Gait velocity: decr Gait velocity interpretation: <1.8 ft/sec, indicative of risk for recurrent falls General Gait Details: Pt tremulous and only able to step  forward two short steps and backwards to bed 2 steps.  Stairs            Wheelchair Mobility    Modified Rankin (Stroke Patients Only)       Balance Overall balance assessment: Needs assistance Sitting-balance support: Bilateral upper extremity supported;Feet supported Sitting balance-Leahy Scale: Poor Sitting balance - Comments: UE support for balance   Standing balance support: Bilateral upper extremity supported Standing balance-Leahy Scale: Poor Standing balance comment: BUE and min A for static standing                             Pertinent Vitals/Pain Pain Assessment: No/denies pain    Home Living Family/patient expects to be discharged to:: Private residence Living Arrangements: Alone Available Help at Discharge: Family;Available PRN/intermittently Type of Home: House Home Access: Stairs to enter Entrance Stairs-Rails: Right Entrance Stairs-Number of Steps: 3-4   Home Equipment: Gilford Rile - 2 wheels Additional Comments: pt vague about equipment at home    Prior Function Level of Independence: Independent               Hand Dominance        Extremity/Trunk Assessment   Upper Extremity Assessment: Generalized weakness           Lower Extremity Assessment: Generalized weakness         Communication   Communication: HOH  Cognition Arousal/Alertness: Awake/alert Behavior During Therapy: WFL for tasks assessed/performed Overall Cognitive Status: No family/caregiver present to determine baseline cognitive functioning Area of Impairment: Memory;Safety/judgement;Problem solving     Memory: Decreased recall of precautions;Decreased short-term  memory   Safety/Judgement: Decreased awareness of safety;Decreased awareness of deficits   Problem Solving: Slow processing;Requires verbal cues;Requires tactile cues      General Comments      Exercises        Assessment/Plan    PT Assessment Patient needs continued PT services   PT Diagnosis Difficulty walking;Generalized weakness   PT Problem List Decreased strength;Decreased activity tolerance;Decreased balance;Decreased mobility;Decreased cognition;Decreased knowledge of use of DME;Decreased safety awareness;Decreased knowledge of precautions  PT Treatment Interventions DME instruction;Gait training;Functional mobility training;Therapeutic activities;Therapeutic exercise;Balance training;Cognitive remediation;Patient/family education   PT Goals (Current goals can be found in the Care Plan section) Acute Rehab PT Goals Patient Stated Goal: Not stated PT Goal Formulation: With patient/family Time For Goal Achievement: 01/16/16 Potential to Achieve Goals: Good    Frequency Min 3X/week   Barriers to discharge Decreased caregiver support Lives alone    Co-evaluation               End of Session Equipment Utilized During Treatment: Gait belt Activity Tolerance: Patient limited by fatigue Patient left: in chair;with call bell/phone within reach;with chair alarm set;with family/visitor present Nurse Communication: Mobility status         Time: [id number] PT Time Calculation (min) (ACUTE ONLY): 28 min   Charges:   PT Evaluation $PT Eval Moderate Complexity: 1 Procedure PT Treatments $Gait Training: 8-22 mins   PT G Codes:        Jazmene Racz 2016/01/26, 3:28 PM [facility] PT [phone]

## 2016-01-03 DIAGNOSIS — I25119 Atherosclerotic heart disease of native coronary artery with unspecified angina pectoris: Secondary | ICD-10-CM

## 2016-01-03 DIAGNOSIS — I480 Paroxysmal atrial fibrillation: Principal | ICD-10-CM

## 2016-01-03 LAB — BASIC METABOLIC PANEL
Anion gap: 6 (ref 5–15)
BUN: 14 mg/dL (ref 6–20)
CO2: 26 mmol/L (ref 22–32)
Calcium: 8.2 mg/dL — ABNORMAL LOW (ref 8.9–10.3)
Chloride: 102 mmol/L (ref 101–111)
Creatinine, Ser: 1.05 mg/dL — ABNORMAL HIGH (ref 0.44–1.00)
GFR calc Af Amer: 55 mL/min — ABNORMAL LOW (ref >60)
GFR calc non Af Amer: 48 mL/min — ABNORMAL LOW (ref >60)
Glucose, Bld: 122 mg/dL — ABNORMAL HIGH (ref 65–99)
Potassium: 4.2 mmol/L (ref 3.5–5.1)
Sodium: 134 mmol/L — ABNORMAL LOW (ref 135–145)

## 2016-01-03 LAB — HEPATIC FUNCTION PANEL
ALT: 17 U/L (ref 14–54)
AST: 20 U/L (ref 15–41)
Albumin: 2.3 g/dL — ABNORMAL LOW (ref 3.5–5.0)
Alkaline Phosphatase: 65 U/L (ref 38–126)
Bilirubin, Direct: <0.1 mg/dL — ABNORMAL LOW (ref 0.1–0.5)
Total Bilirubin: 0.3 mg/dL (ref 0.3–1.2)
Total Protein: 4.8 g/dL — ABNORMAL LOW (ref 6.5–8.1)

## 2016-01-03 LAB — LIPID PANEL
Cholesterol: 118 mg/dL (ref 0–200)
HDL: 43 mg/dL (ref >40)
LDL Cholesterol: 54 mg/dL (ref 0–99)
Total CHOL/HDL Ratio: 2.7 RATIO
Triglycerides: 106 mg/dL (ref <150)
VLDL: 21 mg/dL (ref 0–40)

## 2016-01-03 NOTE — NC FL2 (Signed)
[place] MEDICAID FL2 LEVEL OF CARE SCREENING TOOL     IDENTIFICATION  Patient Name: Tammy [name] Birthdate: 08-14-1930 Sex: female Admission Date (Current Location): 12/31/2015  [facility] and [place] Number:  Herbalist and Address:  [facility] [place], [place] [address] [place], [place] [address]      Provider Number: [id number]  Attending Physician Name and Address:  Mendel Corning, MD  Relative Name and Phone Number:       Current Level of Care: Hospital Recommended Level of Care: [place] Prior Approval Number:    Date Approved/Denied:   PASRR Number:    Discharge Plan: SNF    Current Diagnoses: Patient Active Problem List   Diagnosis Date Noted  . Atrial fibrillation with RVR ([place]) 01/01/2016  . Atrial fibrillation ([place]) 01/01/2016  . Alcohol dependence with withdrawal with complication ([place]) [id number]  . Fall 01/01/2016  . AKI (acute kidney injury) ([place])   . Dehydration   . Exertional dyspnea 09/11/2015  . Memory loss 11/13/2013  . Abnormal involuntary movements(781.0) 11/13/2013  . Pulmonary hypertension ([place]) 11/02/2013  . Fatigue 11/02/2013  . CAD (coronary artery disease) 11/16/2012  . Mitral regurgitation 11/16/2012  . Renal artery stenosis ([place]) 11/16/2012  . Hyperlipidemia with target LDL less than 70 11/16/2012  . Essential hypertension 11/16/2012    Orientation RESPIRATION BLADDER Height & Weight     Self, Situation, Place  Normal Incontinent Weight: 137 lb 2 oz (62.2 kg) Height:  5\' 2"  (157.5 cm)  BEHAVIORAL SYMPTOMS/MOOD NEUROLOGICAL BOWEL NUTRITION STATUS      Incontinent Diet (Heart Healthy / Thin Liquids)  AMBULATORY STATUS COMMUNICATION OF NEEDS Skin   Limited Assist Verbally Normal                       Personal Care Assistance Level of Assistance  Bathing, Feeding, Dressing Bathing Assistance: Limited assistance Feeding assistance: Independent Dressing Assistance: Limited  assistance     Functional Limitations Info  Sight, Hearing, Speech Sight Info: Adequate Hearing Info: Adequate Speech Info: Adequate    SPECIAL CARE FACTORS FREQUENCY  PT (By licensed PT), OT (By licensed OT)     PT Frequency: 3 OT Frequency: 3            Contractures Contractures Info: Not present    Additional Factors Info  Code Status, Allergies, Psychotropic Code Status Info: Full Code Allergies Info: Penicillins Psychotropic Info: Aricept, Remeron, Ativan, Cymbalta         Current Medications (01/03/2016):  This is the current hospital active medication list Current Facility-Administered Medications  Medication Dose Route Frequency Provider Last Rate Last Dose  . acetaminophen (TYLENOL) tablet 650 mg  650 mg Oral Q4H PRN Karmen Bongo, MD      . aspirin EC tablet 81 mg  81 mg Oral BID [name], MD   81 mg at 01/03/16 1100  . atorvastatin (LIPITOR) tablet 10 mg  10 mg Oral QHS Kimberly B Hammons, RPH   10 mg at 01/02/16 2232  . diltiazem (CARDIZEM CD) 24 hr capsule 120 mg  120 mg Oral Daily [name], MD   120 mg at 01/03/16 1100  . donepezil (ARICEPT) tablet 10 mg  10 mg Oral Daily Karmen Bongo, MD   10 mg at 01/03/16 1100  . DULoxetine (CYMBALTA) DR capsule 60 mg  60 mg Oral Daily Karmen Bongo, MD   60 mg at 01/03/16 1100  . enoxaparin (LOVENOX) injection  60 mg  60 mg Subcutaneous BID Wynell Balloon, RPH   60 mg at 01/03/16 1100  . famotidine (PEPCID) tablet 40 mg  40 mg Oral QHS Karmen Bongo, MD   40 mg at 01/02/16 2232  . folic acid (FOLVITE) tablet 1 mg  1 mg Oral Daily Karmen Bongo, MD   1 mg at 01/03/16 1100  . LORazepam (ATIVAN) injection 0-4 mg  0-4 mg Intravenous Q12H Ripudeep K Rai, MD      . LORazepam (ATIVAN) tablet 1 mg  1 mg Oral Q6H PRN Ripudeep Krystal Eaton, MD       Or  . LORazepam (ATIVAN) injection 1 mg  1 mg Intravenous Q6H PRN Ripudeep K Rai, MD      . metoprolol (LOPRESSOR) tablet 100 mg  100 mg Oral BID Karmen Bongo, MD   100  mg at 01/03/16 1100  . mirtazapine (REMERON) tablet 15 mg  15 mg Oral QHS Karmen Bongo, MD   15 mg at 01/02/16 2232  . mometasone-formoterol (DULERA) 100-5 MCG/ACT inhaler 2 puff  2 puff Inhalation BID Karmen Bongo, MD   2 puff at 01/03/16 0818  . multivitamin with minerals tablet 1 tablet  1 tablet Oral Daily Karmen Bongo, MD   1 tablet at 01/03/16 1100  . ondansetron (ZOFRAN) injection 4 mg  4 mg Intravenous Q6H PRN Karmen Bongo, MD      . pantoprazole (PROTONIX) EC tablet 40 mg  40 mg Oral Daily Karmen Bongo, MD   40 mg at 01/03/16 1100  . quinapril (ACCUPRIL) tablet 40 mg  40 mg Oral Daily Karmen Bongo, MD   40 mg at 01/03/16 1000  . thiamine (VITAMIN B-1) tablet 100 mg  100 mg Oral Daily Karmen Bongo, MD   100 mg at 01/03/16 1100   Or  . thiamine (B-1) injection 100 mg  100 mg Intravenous Daily Karmen Bongo, MD         Discharge Medications: Please see discharge summary for a list of discharge medications.  Relevant Imaging Results:  Relevant Lab Results:   Additional Information SSN [id number]    Barbette Or, [place]

## 2016-01-03 NOTE — Progress Notes (Signed)
[facility]                                                                              Patient Demographics  Tammy [name], is a 82 y.o. female, DOB - July 29, 1930, [id number]  Admit date - 12/31/2015   Admitting Physician Karmen Bongo, MD  Outpatient Primary MD for the patient is  Melinda Crutch, MD  Outpatient specialists:   LOS - 2  days    Chief Complaint  Patient presents with  . Atrial Fibrillation  . Fall       Brief summary   Tammy [name] is a 82 y.o. female with medical history significant of *CAD, HLD, HTN, and COPD.  She lives at home alone.  Family last spoke with her Sunday and unable to reach until finally came to check on her today (daughter was out of town).  Police helped to break into the house and she was lying in the floor.  Minimal clothes on but was able to get up to get Depends.  Minimal PO intake.  No h/o afib.  Remote h/o CABG.  No c/o pain - no injuries from the fall.  Patient reports that her foot hit something and she lost her balance.  Denies chest pains, +palpitations intermittently.  Has been c/o not feeling well.  Had appointment with Dr. Georgina Peer last week and didn't go because she wasn't feeling well enough.  Daughter reported she has been deteriorating.  Is having problems with memory.  Still driving.  Has urinary and fecal incontinence which keep her mostly at home.   Drinks wine and beer daily.  Started drinking about age 33.  Binge drinking.  Most she ever drinks in a day is a couple bottles of wine.  Denies h/o withdrawal including DTs, seizures.  Does acknowledge that she has a problem with ETOH, thinks she can quit on her own. ED Course: Diagnoses: Afib with RVR - Cardizem bolus and drip; treatment-dose Lovenox; Lopressor    Assessment & Plan   Paroxysmal New-onset afib with RVR - Currently rate controlled and in normal sinus rhythm -Patient initially required a Diltiazem drip as well as Lopressor pushes in ER but was  eventually able to be weaned off and finally converted back to NSR. - Per cardiology, continue metoprolol, amlodipine discontinued. Cardizem started - CHA2DS2-VASc Score is 5, cont lovenox, anticoagulation per cardiology -She remains a very high fall risk, particularly if she continues drinking, Hence not on any anticoagulation - TSH normal, CTA neg for PE  Falls -No rhabdomyolysis despite being mostly unable to move for the last 2-3 days, has frequent falls - likely multifactorial but very likely related in large part to her dementia and alcohol use  Alcohol dependence with acute withdrawls -By her description, this is more binge drinking than daily, she is capable of consuming large amounts of ETOH - up to several bottles of wine daily - placed on scheduled ativan with CIWA protocol and thiamine/folate - SW consult for substance abuse2. UDS negative - Alcohol withdrawals are improving, less shaky today  Memory loss -May be related to ETOH but Alzheimer's is also a consideration -She  may require placement, as with falls and ETOH, may no longer be safe for her to live at home alone, PT eval pending   HTN - controlled -Continue Lopressor, Accupril  HLD -With h/o CAD, LDL goal is <70 - Continue statin   Code Status: full DVT Prophylaxis:  Lovenox  Family Communication: Discussed in detail with the patient, all imaging results, lab results explained to the patient   Disposition Plan:   Time Spent in minutes  25 minutes  Procedures:    Consultants:   cardiology  Antimicrobials:      Medications  Scheduled Meds: . aspirin EC  81 mg Oral BID  . atorvastatin  10 mg Oral QHS  . diltiazem  120 mg Oral Daily  . donepezil  10 mg Oral Daily  . DULoxetine  60 mg Oral Daily  . enoxaparin (LOVENOX) injection  60 mg Subcutaneous BID  . famotidine  40 mg Oral QHS  . folic acid  1 mg Oral Daily  . LORazepam  0-4 mg Intravenous Q12H  . metoprolol  100 mg Oral BID  .  mirtazapine  15 mg Oral QHS  . mometasone-formoterol  2 puff Inhalation BID  . multivitamin with minerals  1 tablet Oral Daily  . pantoprazole  40 mg Oral Daily  . quinapril  40 mg Oral Daily  . thiamine  100 mg Oral Daily   Or  . thiamine  100 mg Intravenous Daily   Continuous Infusions:  PRN Meds:.acetaminophen, LORazepam **OR** LORazepam, ondansetron (ZOFRAN) IV   Antibiotics   Anti-infectives    None        Subjective:   Tammy [name] was seen and examined today. Rate controlled, less shaky today. No chest pain. Patient denies dizziness, chest pain, shortness of breath, abdominal pain, N/V/D/C, new weakness, numbess, tingling. No acute events overnight.   Objective:   Vitals:   01/02/16 2017 01/02/16 2043 01/03/16 0524 01/03/16 0818  BP:  (!) 156/49 (!) 152/67   Pulse:  66 (!) 57   Resp:  18 18   Temp:  98.1 F (36.7 C) 97.9 F (36.6 C)   TempSrc:  Oral Oral   SpO2: 94% 95% 96% 97%  Weight:      Height:        Intake/Output Summary (Last 24 hours) at 01/03/16 1234 Last data filed at 01/03/16 0840  Gross per 24 hour  Intake              600 ml  Output                0 ml  Net              600 ml     Wt Readings from Last 3 Encounters:  01/01/16 62.2 kg (137 lb 2 oz)  09/10/15 64 kg (141 lb 1 oz)  08/05/15 59.9 kg (132 lb)     Exam  General: Alert and oriented x 3, NAD  HEENT:    Neck: Supple, no JVD  Cardiovascular: S1 S2 clear, RRR  Respiratory: CTAB  Gastrointestinal: Soft, nontender, nondistended, + bowel sounds  Ext: no cyanosis clubbing or edema  Neuro:no new deficits   Skin: No rashes  Psych: Normal affect and demeanor, alert and oriented x3    Data Reviewed:  I have personally reviewed following labs and imaging studies  Micro Results No results found for this or any previous visit (from the past 240 hour(s)).  Radiology Reports Dg Chest 2 View  Result Date: 12/31/2015 CLINICAL DATA:  82 year old female with shortness of  breath. atrial fibrillation. Fall 3 days ago. Initial encounter. EXAM: CHEST  2 VIEW COMPARISON:  07/11/2009 and earlier. FINDINGS: Semi upright AP and lateral views of the chest. Increase cardiomegaly. Stable sequelae of CABG. No pneumothorax. No pulmonary edema or pleural effusion. No confluent pulmonary opacity. Eventration of the diaphragm again noted. New line osteopenia. Visualized tracheal air column is within normal limits. No acute osseous abnormality identified. IMPRESSION: No acute cardiopulmonary abnormality. Cardiomegaly has progressed since 2011. Calcified aortic atherosclerosis. Electronically Signed   By: Genevie Ann M.D.   On: 12/31/2015 21:29   Ct Head Wo Contrast  Result Date: 12/31/2015 CLINICAL DATA:  Status post fall. May have been on the ground for 2 days. Initial encounter. EXAM: CT HEAD WITHOUT CONTRAST TECHNIQUE: Contiguous axial images were obtained from the base of the skull through the vertex without intravenous contrast. COMPARISON:  CT of the head performed 07/11/2009 FINDINGS: Brain: There is no evidence of acute infarction, mass lesion, or intra- or extra-axial hemorrhage on CT. Prominence of the ventricles and sulci reflects moderate cortical volume loss. Mild cerebellar atrophy is noted. A chronic lacunar infarct is noted at the right cerebellar hemisphere. The brainstem and fourth ventricle are within normal limits. The basal ganglia are unremarkable in appearance. The cerebral hemispheres demonstrate grossly normal gray-white differentiation. No mass effect or midline shift is seen. Vascular: No hyperdense vessel or unexpected calcification. Skull: There is no evidence of fracture; visualized osseous structures are unremarkable in appearance. Sinuses/Orbits: The visualized portions of the orbits are within normal limits. The paranasal sinuses and mastoid air cells are well-aerated. Other: Mild soft tissue swelling is noted lateral to the left orbit. IMPRESSION: 1. No acute  intracranial pathology seen on CT. 2. Mild soft tissue swelling lateral to the left orbit. 3. Moderate cortical volume loss noted. 4. Chronic lacunar infarct at the right cerebellar hemisphere. Electronically Signed   By: Garald Balding M.D.   On: 12/31/2015 23:48   Ct Angio Chest Pe W And/or Wo Contrast  Result Date: 01/01/2016 CLINICAL DATA:  82 y/o F; shortness of breath and positive D-dimer. History of atrial fibrillation, hypertension, and coronary artery disease. EXAM: CT ANGIOGRAPHY CHEST WITH CONTRAST TECHNIQUE: Multidetector CT imaging of the chest was performed using the standard protocol during bolus administration of intravenous contrast. Multiplanar CT image reconstructions and MIPs were obtained to evaluate the vascular anatomy. CONTRAST:  100 cc Isovue 370. COMPARISON:  Chest radiograph dated 12/31/2015. Chest CT dated 7/8/8. FINDINGS: Mediastinum/Lymph Nodes: No pulmonary emboli or thoracic aortic dissection identified. No masses or pathologically enlarged lymph nodes identified. Cardiac: Mild coronary artery calcifications. Postsurgical changes related to a LIMA bypass graft and saphenous graft. Healed median sternotomy. No cardiomegaly. No pericardial effusion. Lungs/Pleura: No pulmonary mass, infiltrate, or effusion. Mild centrilobular emphysema with upper lobe predominance. Upper abdomen: Gallstones.  No acute findings. Musculoskeletal: No chest wall mass or suspicious bone lesions identified. Chronic left anterior rib fracture deformities. Multilevel degenerative changes of the spine. Flowing anterior ossification of multiple thoracic vertebral bodies compatible with DISH. Review of the MIP images confirms the above findings. IMPRESSION: 1. No pulmonary embolus is identified.  No acute pulmonary process. 2. Mild emphysema. 3. Gallstones. Electronically Signed   By: Kristine Garbe M.D.   On: 01/01/2016 03:06    Lab Data:  CBC:  Recent Labs Lab 12/31/15 2042 01/02/16 0450    WBC 13.0* 6.7  HGB 13.3 11.0*  HCT 39.1 34.3*  MCV 85.6 89.1  PLT 293 [id number]   Basic Metabolic Panel:  Recent Labs Lab 12/31/15 2042 01/02/16 0450 01/03/16 0304  NA 129* 135 134*  K 4.6 4.2 4.2  CL 93* 101 102  CO2 21* 27 26  GLUCOSE 116* 90 122*  BUN 8 7 14   CREATININE 1.33* 1.02* 1.05*  CALCIUM 8.6* 8.4* 8.2*   GFR: Estimated Creatinine Clearance: 35.2 mL/min (by C-G formula based on SCr of 1.05 mg/dL). Liver Function Tests:  Recent Labs Lab 01/03/16 0304  AST 20  ALT 17  ALKPHOS 65  BILITOT 0.3  PROT 4.8*  ALBUMIN 2.3*   No results for input(s): LIPASE, AMYLASE in the last 168 hours. No results for input(s): AMMONIA in the last 168 hours. Coagulation Profile: No results for input(s): INR, PROTIME in the last 168 hours. Cardiac Enzymes:  Recent Labs Lab 12/31/15 2042  CKTOTAL 195   BNP (last 3 results) No results for input(s): PROBNP in the last 8760 hours. HbA1C: No results for input(s): HGBA1C in the last 72 hours. CBG: No results for input(s): GLUCAP in the last 168 hours. Lipid Profile:  Recent Labs  01/03/16 0304  CHOL 118  HDL 43  LDLCALC 54  TRIG 106  CHOLHDL 2.7   Thyroid Function Tests: No results for input(s): TSH, T4TOTAL, FREET4, T3FREE, THYROIDAB in the last 72 hours. Anemia Panel: No results for input(s): VITAMINB12, FOLATE, FERRITIN, TIBC, IRON, RETICCTPCT in the last 72 hours. Urine analysis:    Component Value Date/Time   COLORURINE YELLOW 12/31/2015 2355   APPEARANCEUR CLEAR 12/31/2015 2355   LABSPEC 1.015 12/31/2015 2355   PHURINE 5.5 12/31/2015 2355   GLUCOSEU NEGATIVE 12/31/2015 2355   HGBUR NEGATIVE 12/31/2015 2355   BILIRUBINUR SMALL (A) 12/31/2015 2355   KETONESUR 15 (A) 12/31/2015 2355   PROTEINUR NEGATIVE 12/31/2015 2355   UROBILINOGEN 0.2 07/11/2009 2220   NITRITE NEGATIVE 12/31/2015 2355   LEUKOCYTESUR NEGATIVE 12/31/2015 2355     RAI,RIPUDEEP M.D. [facility] 01/03/2016, 12:34 PM  Pager:  [phone] Between 7am to 7pm - call Pager - [phone]  After 7pm go to [email or web address] - password [email or web address]  Call night coverage person covering after 7pm

## 2016-01-03 NOTE — Progress Notes (Signed)
Primary cardiologist: Dr. Shelva Majestic  Seen for followup: Atrial fibrillation  Subjective:    No chest pain or palpitations.  Objective:   Temp:  [97.6 F (36.4 C)-98.1 F (36.7 C)] 97.9 F (36.6 C) (08/19 0524) Pulse Rate:  [57-66] 57 (08/19 0524) Resp:  [18] 18 (08/19 0524) BP: (108-156)/(46-67) 152/67 (08/19 0524) SpO2:  [94 %-97 %] 97 % (08/19 0818) Last BM Date: 01/01/16  Filed Weights   01/01/16 0421  Weight: 137 lb 2 oz (62.2 kg)    Intake/Output Summary (Last 24 hours) at 01/03/16 1338 Last data filed at 01/03/16 0840  Gross per 24 hour  Intake              600 ml  Output                0 ml  Net              600 ml    Telemetry: Sinus rhythm.  Exam:  General: Frail appearing elderly woman in no distress.  Lungs: Clear, nonlabored.  Cardiac: RRR without gallop.  Extremities: No pitting edema.  Lab Results:  Basic Metabolic Panel:  Recent Labs Lab 12/31/15 2042 01/02/16 0450 01/03/16 0304  NA 129* 135 134*  K 4.6 4.2 4.2  CL 93* 101 102  CO2 21* 27 26  GLUCOSE 116* 90 122*  BUN 8 7 14   CREATININE 1.33* 1.02* 1.05*  CALCIUM 8.6* 8.4* 8.2*    Liver Function Tests:  Recent Labs Lab 01/03/16 0304  AST 20  ALT 17  ALKPHOS 65  BILITOT 0.3  PROT 4.8*  ALBUMIN 2.3*    CBC:  Recent Labs Lab 12/31/15 2042 01/02/16 0450  WBC 13.0* 6.7  HGB 13.3 11.0*  HCT 39.1 34.3*  MCV 85.6 89.1  PLT 293 237    Cardiac Enzymes:  Recent Labs Lab 12/31/15 2042  CKTOTAL 195   Echocardiogram 10/01/2015: Study Conclusions  - Left ventricle: The cavity size was normal. There was mild focal   basal hypertrophy of the septum. Systolic function was normal.   The estimated ejection fraction was in the range of 60% to 65%.   Wall motion was normal; there were no regional wall motion   abnormalities. Doppler parameters are consistent with abnormal   left ventricular relaxation (grade 1 diastolic dysfunction). - Aortic valve: There was  no stenosis. - Mitral valve: Mildly calcified annulus. There was no significant   regurgitation. - Left atrium: The atrium was mildly dilated. - Right ventricle: The cavity size was normal. Systolic function   was normal. - Tricuspid valve: Peak RV-RA gradient (S): 27 mm Hg. - Pulmonary arteries: PA peak pressure: 30 mm Hg (S). - Inferior vena cava: The vessel was normal in size. The   respirophasic diameter changes were in the normal range (>= 50%),   consistent with normal central venous pressure.  Impressions:  - Normal LV size with mild focal basal septal hypertrophy. EF   60-65%. Normal RV size and systolic function. No significant   valvular abnormalities.   Medications:   Scheduled Medications: . aspirin EC  81 mg Oral BID  . atorvastatin  10 mg Oral QHS  . diltiazem  120 mg Oral Daily  . donepezil  10 mg Oral Daily  . DULoxetine  60 mg Oral Daily  . enoxaparin (LOVENOX) injection  60 mg Subcutaneous BID  . famotidine  40 mg Oral QHS  . folic acid  1 mg Oral Daily  .  LORazepam  0-4 mg Intravenous Q12H  . metoprolol  100 mg Oral BID  . mirtazapine  15 mg Oral QHS  . mometasone-formoterol  2 puff Inhalation BID  . multivitamin with minerals  1 tablet Oral Daily  . pantoprazole  40 mg Oral Daily  . quinapril  40 mg Oral Daily  . thiamine  100 mg Oral Daily   Or  . thiamine  100 mg Intravenous Daily     PRN Medications:  acetaminophen, LORazepam **OR** LORazepam, ondansetron (ZOFRAN) IV   Assessment:   1. PAF with CHADSVASC score of 5. History of heavy alcohol use and frequent falling makes her poor candidate for anticoagulation - I reviewed Dr. Evette Georges consultation note and recommendations. She is in sinus rhythm at this time on combination of metoprolol and Cardizem CD. Heart rate 50-60s.  2. CAD status post CABG in 1994, no significant ischemia by Myoview in 2016. No angina symptoms.  3. Acute renal insufficiency, creatinine improved to 1.1 with  hydration.  4. Hyperlipidemia on statin therapy. Follow-up LDL 54.   Plan/Discussion:    At this point continue aspirin, Cardizem CD 120 mg daily, and Lopressor 100 mg twice daily. Rhythm is stable and she seems to be tolerating current regimen well. No further cardiac recommendations for now. We will sign off, call with questions.   Satira Sark, M.D., F.A.C.C.

## 2016-01-04 LAB — BASIC METABOLIC PANEL
Anion gap: 12 (ref 5–15)
BUN: 14 mg/dL (ref 6–20)
CO2: 24 mmol/L (ref 22–32)
Calcium: 8.5 mg/dL — ABNORMAL LOW (ref 8.9–10.3)
Chloride: 99 mmol/L — ABNORMAL LOW (ref 101–111)
Creatinine, Ser: 1.02 mg/dL — ABNORMAL HIGH (ref 0.44–1.00)
GFR calc Af Amer: 57 mL/min — ABNORMAL LOW (ref >60)
GFR calc non Af Amer: 49 mL/min — ABNORMAL LOW (ref >60)
Glucose, Bld: 88 mg/dL (ref 65–99)
Potassium: 4.2 mmol/L (ref 3.5–5.1)
Sodium: 135 mmol/L (ref 135–145)

## 2016-01-04 MED ORDER — HEPARIN SODIUM (PORCINE) 5000 UNIT/ML IJ SOLN
5000.0000 [IU] | Freq: Three times a day (TID) | INTRAMUSCULAR | Status: DC
  Administered 2016-01-04 – 2016-01-06 (×5): 5000 [IU] via SUBCUTANEOUS
  Filled 2016-01-04 (×5): qty 1

## 2016-01-04 MED ORDER — HEPARIN SODIUM (PORCINE) 5000 UNIT/ML IJ SOLN: 5000.0000 [IU] | Freq: Three times a day (TID) | INTRAMUSCULAR | Status: DC

## 2016-01-04 NOTE — Progress Notes (Signed)
Daughter wishes to be called on Monday morning by doctor and social worker with up date she is unhappy with progress over the weekend.

## 2016-01-04 NOTE — NC FL2 (Signed)
[place] MEDICAID FL2 LEVEL OF CARE SCREENING TOOL     IDENTIFICATION  Patient Name: Tammy [name] Birthdate: 04/26/1931 Sex: female Admission Date (Current Location): 12/31/2015  [facility] and [place] Number:  Herbalist and Address:  [facility]. [facility] [place] [address] [place], [place] [address]      Provider Number: [id number]  Attending Physician Name and Address:  Mendel Corning, MD  Relative Name and Phone Number:       Current Level of Care: Hospital Recommended Level of Care: [place] Prior Approval Number:    Date Approved/Denied:   PASRR Number: [id number]  Discharge Plan: SNF    Current Diagnoses: Patient Active Problem List   Diagnosis Date Noted  . Atrial fibrillation with RVR ([place]) 01/01/2016  . PAF (paroxysmal atrial fibrillation) ([place]) 01/01/2016  . Alcohol dependence with withdrawal with complication ([place]) [id number]  . Fall 01/01/2016  . AKI (acute kidney injury) ([place])   . Dehydration   . Exertional dyspnea 09/11/2015  . Memory loss 11/13/2013  . Abnormal involuntary movements(781.0) 11/13/2013  . Pulmonary hypertension ([place]) 11/02/2013  . Fatigue 11/02/2013  . Coronary artery disease involving native coronary artery of native heart with angina pectoris ([place]) 11/16/2012  . Mitral regurgitation 11/16/2012  . Renal artery stenosis ([place]) 11/16/2012  . Hyperlipidemia with target LDL less than 70 11/16/2012  . Essential hypertension 11/16/2012    Orientation RESPIRATION BLADDER Height & Weight     Self, Situation, Place  Normal Incontinent Weight: 137 lb 2 oz (62.2 kg) Height:  5\' 2"  (157.5 cm)  BEHAVIORAL SYMPTOMS/MOOD NEUROLOGICAL BOWEL NUTRITION STATUS      Incontinent Diet (Heart Healthy / Thin Liquids)  AMBULATORY STATUS COMMUNICATION OF NEEDS Skin   Limited Assist Verbally Normal                       Personal Care Assistance Level of Assistance  Bathing, Feeding, Dressing Bathing  Assistance: Limited assistance Feeding assistance: Independent Dressing Assistance: Limited assistance     Functional Limitations Info  Sight, Hearing, Speech Sight Info: Adequate Hearing Info: Adequate Speech Info: Adequate    SPECIAL CARE FACTORS FREQUENCY  PT (By licensed PT), OT (By licensed OT)     PT Frequency: 3 OT Frequency: 3            Contractures Contractures Info: Not present    Additional Factors Info  Code Status, Allergies, Psychotropic Code Status Info: Full Code Allergies Info: Penicillins Psychotropic Info: Aricept, Remeron, Ativan, Cymbalta         Current Medications (01/04/2016):  This is the current hospital active medication list Current Facility-Administered Medications  Medication Dose Route Frequency Provider Last Rate Last Dose  . acetaminophen (TYLENOL) tablet 650 mg  650 mg Oral Q4H PRN Karmen Bongo, MD      . aspirin EC tablet 81 mg  81 mg Oral BID [name], MD   81 mg at 01/03/16 2201  . atorvastatin (LIPITOR) tablet 10 mg  10 mg Oral QHS Kimberly B Hammons, RPH   10 mg at 01/03/16 2201  . diltiazem (CARDIZEM CD) 24 hr capsule 120 mg  120 mg Oral Daily [name], MD   120 mg at 01/03/16 1100  . donepezil (ARICEPT) tablet 10 mg  10 mg Oral Daily Karmen Bongo, MD   10 mg at 01/03/16 1100  . DULoxetine (CYMBALTA) DR capsule 60 mg  60 mg Oral Daily Karmen Bongo, MD  60 mg at 01/03/16 1100  . enoxaparin (LOVENOX) injection 60 mg  60 mg Subcutaneous BID Wynell Balloon, RPH   60 mg at 01/03/16 2202  . famotidine (PEPCID) tablet 40 mg  40 mg Oral QHS Karmen Bongo, MD   40 mg at 01/03/16 2202  . folic acid (FOLVITE) tablet 1 mg  1 mg Oral Daily Karmen Bongo, MD   1 mg at 01/03/16 1100  . LORazepam (ATIVAN) injection 0-4 mg  0-4 mg Intravenous Q12H Ripudeep K Rai, MD      . metoprolol (LOPRESSOR) tablet 100 mg  100 mg Oral BID Karmen Bongo, MD   100 mg at 01/03/16 2201  . mirtazapine (REMERON) tablet 15 mg  15 mg Oral QHS  Karmen Bongo, MD   15 mg at 01/03/16 2219  . mometasone-formoterol (DULERA) 100-5 MCG/ACT inhaler 2 puff  2 puff Inhalation BID Karmen Bongo, MD   2 puff at 01/04/16 [phone]  . multivitamin with minerals tablet 1 tablet  1 tablet Oral Daily Karmen Bongo, MD   1 tablet at 01/03/16 1100  . ondansetron (ZOFRAN) injection 4 mg  4 mg Intravenous Q6H PRN Karmen Bongo, MD      . pantoprazole (PROTONIX) EC tablet 40 mg  40 mg Oral Daily Karmen Bongo, MD   40 mg at 01/03/16 1100  . quinapril (ACCUPRIL) tablet 40 mg  40 mg Oral Daily Karmen Bongo, MD   40 mg at 01/03/16 1000  . thiamine (VITAMIN B-1) tablet 100 mg  100 mg Oral Daily Karmen Bongo, MD   100 mg at 01/03/16 1100   Or  . thiamine (B-1) injection 100 mg  100 mg Intravenous Daily Karmen Bongo, MD         Discharge Medications: Please see discharge summary for a list of discharge medications.  Relevant Imaging Results:  Relevant Lab Results:   Additional Information SSN [id number]    Barbette Or, [place]

## 2016-01-04 NOTE — Progress Notes (Signed)
[facility]                                                                              Patient Demographics  Nonie Tyrrell, is a 82 y.o. female, DOB - February 04, 1931, CS:[id number]  Admit date - 12/31/2015   Admitting Physician Karmen Bongo, MD  Outpatient Primary MD for the patient is  Melinda Crutch, MD  Outpatient specialists:   LOS - 3  days    Chief Complaint  Patient presents with  . Atrial Fibrillation  . Fall       Brief summary   LESIA ATTKISSON is a 82 y.o. female with medical history significant of *CAD, HLD, HTN, and COPD.  She lives at home alone.  Family last spoke with her Sunday and unable to reach until finally came to check on her today (daughter was out of town).  Police helped to break into the house and she was lying in the floor.  Minimal clothes on but was able to get up to get Depends.  Minimal PO intake.  No h/o afib.  Remote h/o CABG.  No c/o pain - no injuries from the fall.  Patient reports that her foot hit something and she lost her balance.  Denies chest pains, +palpitations intermittently.  Has been c/o not feeling well.  Had appointment with Dr. Georgina Peer last week and didn't go because she wasn't feeling well enough.  Daughter reported she has been deteriorating.  Is having problems with memory.  Still driving.  Has urinary and fecal incontinence which keep her mostly at home.   Drinks wine and beer daily.  Started drinking about age 70.  Binge drinking.  Most she ever drinks in a day is a couple bottles of wine.  Denies h/o withdrawal including DTs, seizures.  Does acknowledge that she has a problem with ETOH, thinks she can quit on her own. ED Course: Diagnoses: Afib with RVR - Cardizem bolus and drip; treatment-dose Lovenox; Lopressor    Assessment & Plan   Paroxysmal New-onset afib with RVR - Currently rate controlled and in normal sinus rhythm -Patient initially required a Diltiazem drip as well as Lopressor pushes in ER but was  eventually able to be weaned off and finally converted back to NSR. - Per cardiology, continue metoprolol, amlodipine discontinued. Cardizem started - CHA2DS2-VASc Score is 5, cont lovenox. No anticoagulation per cardiology. She remains a very high fall risk, particularly if she continues drinking, Hence not on any anticoagulation - TSH normal, CTA neg for PE  Falls -No rhabdomyolysis despite being mostly unable to move for the last 2-3 days, has frequent falls - likely multifactorial but very likely related in large part to her dementia and alcohol use  Alcohol dependence with acute withdrawls - Currently improved -By her description, her alcohol drinking is more binge drinking than daily, she is capable of consuming large amounts of ETOH - up to several bottles of wine daily - placed on scheduled ativan with CIWA protocol and thiamine/folate - SW consult for substance abuse2. UDS negative  Memory loss -May be related to ETOH but Alzheimer's is also a consideration -She may require  placement, as with falls and ETOH, may no longer be safe for her to live at home alone, PT eval pending   HTN - controlled -Continue Lopressor, Accupril  HLD -With h/o CAD, LDL goal is <70 - Continue statin   Mild acute renal insufficiency Creatinine 1.3 at the time of admission, improved to 1.0 with gentle hydration  Code Status: full DVT Prophylaxis:  Lovenox  Family Communication: Discussed in detail with the patient, all imaging results, lab results explained to the patient   Disposition Plan: Awaiting skilled nursing facility, likely DC in a.m.  Time Spent in minutes  25 minutes  Procedures:    Consultants:   cardiology  Antimicrobials:      Medications  Scheduled Meds: . aspirin EC  81 mg Oral BID  . atorvastatin  10 mg Oral QHS  . diltiazem  120 mg Oral Daily  . donepezil  10 mg Oral Daily  . DULoxetine  60 mg Oral Daily  . enoxaparin (LOVENOX) injection  60 mg  Subcutaneous BID  . famotidine  40 mg Oral QHS  . folic acid  1 mg Oral Daily  . LORazepam  0-4 mg Intravenous Q12H  . metoprolol  100 mg Oral BID  . mirtazapine  15 mg Oral QHS  . mometasone-formoterol  2 puff Inhalation BID  . multivitamin with minerals  1 tablet Oral Daily  . pantoprazole  40 mg Oral Daily  . quinapril  40 mg Oral Daily  . thiamine  100 mg Oral Daily   Or  . thiamine  100 mg Intravenous Daily   Continuous Infusions:  PRN Meds:.acetaminophen, ondansetron (ZOFRAN) IV   Antibiotics   Anti-infectives    None        Subjective:   Salena Cueto was seen and examined today. In normal sinus rhythm, no complaints today. Withdrawals have improved.  No chest pain. Patient denies dizziness, chest pain, shortness of breath, abdominal pain, N/V/D/C, new weakness, numbess, tingling. No acute events overnight.   Objective:   Vitals:   01/03/16 2039 01/03/16 2150 01/04/16 0537 01/04/16 0749  BP:  (!) 134/52 (!) 152/50   Pulse:  (!) 58 65   Resp:  16    Temp:  98.2 F (36.8 C) 97.9 F (36.6 C)   TempSrc:  Oral Oral   SpO2: 94% 96% 94% 95%  Weight:      Height:        Intake/Output Summary (Last 24 hours) at 01/04/16 1033 Last data filed at 01/03/16 2145  Gross per 24 hour  Intake              540 ml  Output                0 ml  Net              540 ml     Wt Readings from Last 3 Encounters:  01/01/16 62.2 kg (137 lb 2 oz)  09/10/15 64 kg (141 lb 1 oz)  08/05/15 59.9 kg (132 lb)     Exam  General: Alert and oriented x 3, NAD  HEENT:    Neck: Supple, no JVD  Cardiovascular: S1 S2 clear, RRR  Respiratory: CTAB  Gastrointestinal: Soft, nontender, nondistended, + bowel sounds  Ext: no cyanosis clubbing or edema.No significant tremulousness  Neuro:no new deficits   Skin: No rashes  Psych: Normal affect and demeanor, alert and oriented x3    Data Reviewed:  I have personally reviewed following labs  and imaging studies  Micro Results No  results found for this or any previous visit (from the past 240 hour(s)).  Radiology Reports Dg Chest 2 View  Result Date: 12/31/2015 CLINICAL DATA:  82 year old female with shortness of breath. atrial fibrillation. Fall 3 days ago. Initial encounter. EXAM: CHEST  2 VIEW COMPARISON:  07/11/2009 and earlier. FINDINGS: Semi upright AP and lateral views of the chest. Increase cardiomegaly. Stable sequelae of CABG. No pneumothorax. No pulmonary edema or pleural effusion. No confluent pulmonary opacity. Eventration of the diaphragm again noted. New line osteopenia. Visualized tracheal air column is within normal limits. No acute osseous abnormality identified. IMPRESSION: No acute cardiopulmonary abnormality. Cardiomegaly has progressed since 2011. Calcified aortic atherosclerosis. Electronically Signed   By: Genevie Ann M.D.   On: 12/31/2015 21:29   Ct Head Wo Contrast  Result Date: 12/31/2015 CLINICAL DATA:  Status post fall. May have been on the ground for 2 days. Initial encounter. EXAM: CT HEAD WITHOUT CONTRAST TECHNIQUE: Contiguous axial images were obtained from the base of the skull through the vertex without intravenous contrast. COMPARISON:  CT of the head performed 07/11/2009 FINDINGS: Brain: There is no evidence of acute infarction, mass lesion, or intra- or extra-axial hemorrhage on CT. Prominence of the ventricles and sulci reflects moderate cortical volume loss. Mild cerebellar atrophy is noted. A chronic lacunar infarct is noted at the right cerebellar hemisphere. The brainstem and fourth ventricle are within normal limits. The basal ganglia are unremarkable in appearance. The cerebral hemispheres demonstrate grossly normal gray-white differentiation. No mass effect or midline shift is seen. Vascular: No hyperdense vessel or unexpected calcification. Skull: There is no evidence of fracture; visualized osseous structures are unremarkable in appearance. Sinuses/Orbits: The visualized portions of the  orbits are within normal limits. The paranasal sinuses and mastoid air cells are well-aerated. Other: Mild soft tissue swelling is noted lateral to the left orbit. IMPRESSION: 1. No acute intracranial pathology seen on CT. 2. Mild soft tissue swelling lateral to the left orbit. 3. Moderate cortical volume loss noted. 4. Chronic lacunar infarct at the right cerebellar hemisphere. Electronically Signed   By: Garald Balding M.D.   On: 12/31/2015 23:48   Ct Angio Chest Pe W And/or Wo Contrast  Result Date: 01/01/2016 CLINICAL DATA:  82 y/o F; shortness of breath and positive D-dimer. History of atrial fibrillation, hypertension, and coronary artery disease. EXAM: CT ANGIOGRAPHY CHEST WITH CONTRAST TECHNIQUE: Multidetector CT imaging of the chest was performed using the standard protocol during bolus administration of intravenous contrast. Multiplanar CT image reconstructions and MIPs were obtained to evaluate the vascular anatomy. CONTRAST:  100 cc Isovue 370. COMPARISON:  Chest radiograph dated 12/31/2015. Chest CT dated 7/8/8. FINDINGS: Mediastinum/Lymph Nodes: No pulmonary emboli or thoracic aortic dissection identified. No masses or pathologically enlarged lymph nodes identified. Cardiac: Mild coronary artery calcifications. Postsurgical changes related to a LIMA bypass graft and saphenous graft. Healed median sternotomy. No cardiomegaly. No pericardial effusion. Lungs/Pleura: No pulmonary mass, infiltrate, or effusion. Mild centrilobular emphysema with upper lobe predominance. Upper abdomen: Gallstones.  No acute findings. Musculoskeletal: No chest wall mass or suspicious bone lesions identified. Chronic left anterior rib fracture deformities. Multilevel degenerative changes of the spine. Flowing anterior ossification of multiple thoracic vertebral bodies compatible with DISH. Review of the MIP images confirms the above findings. IMPRESSION: 1. No pulmonary embolus is identified.  No acute pulmonary process. 2.  Mild emphysema. 3. Gallstones. Electronically Signed   By: Kristine Garbe M.D.   On: 01/01/2016 03:06  Lab Data:  CBC:  Recent Labs Lab 12/31/15 2042 01/02/16 0450  WBC 13.0* 6.7  HGB 13.3 11.0*  HCT 39.1 34.3*  MCV 85.6 89.1  PLT 293 [id number]   Basic Metabolic Panel:  Recent Labs Lab 12/31/15 2042 01/02/16 0450 01/03/16 0304 01/04/16 0218  NA 129* 135 134* 135  K 4.6 4.2 4.2 4.2  CL 93* 101 102 99*  CO2 21* 27 26 24   GLUCOSE 116* 90 122* 88  BUN 8 7 14 14   CREATININE 1.33* 1.02* 1.05* 1.02*  CALCIUM 8.6* 8.4* 8.2* 8.5*   GFR: Estimated Creatinine Clearance: 36.2 mL/min (by C-G formula based on SCr of 1.02 mg/dL). Liver Function Tests:  Recent Labs Lab 01/03/16 0304  AST 20  ALT 17  ALKPHOS 65  BILITOT 0.3  PROT 4.8*  ALBUMIN 2.3*   No results for input(s): LIPASE, AMYLASE in the last 168 hours. No results for input(s): AMMONIA in the last 168 hours. Coagulation Profile: No results for input(s): INR, PROTIME in the last 168 hours. Cardiac Enzymes:  Recent Labs Lab 12/31/15 2042  CKTOTAL 195   BNP (last 3 results) No results for input(s): PROBNP in the last 8760 hours. HbA1C: No results for input(s): HGBA1C in the last 72 hours. CBG: No results for input(s): GLUCAP in the last 168 hours. Lipid Profile:  Recent Labs  01/03/16 0304  CHOL 118  HDL 43  LDLCALC 54  TRIG 106  CHOLHDL 2.7   Thyroid Function Tests: No results for input(s): TSH, T4TOTAL, FREET4, T3FREE, THYROIDAB in the last 72 hours. Anemia Panel: No results for input(s): VITAMINB12, FOLATE, FERRITIN, TIBC, IRON, RETICCTPCT in the last 72 hours. Urine analysis:    Component Value Date/Time   COLORURINE YELLOW 12/31/2015 2355   APPEARANCEUR CLEAR 12/31/2015 2355   LABSPEC 1.015 12/31/2015 2355   PHURINE 5.5 12/31/2015 2355   GLUCOSEU NEGATIVE 12/31/2015 2355   HGBUR NEGATIVE 12/31/2015 2355   BILIRUBINUR SMALL (A) 12/31/2015 2355   KETONESUR 15 (A) 12/31/2015 2355    PROTEINUR NEGATIVE 12/31/2015 2355   UROBILINOGEN 0.2 07/11/2009 2220   NITRITE NEGATIVE 12/31/2015 2355   LEUKOCYTESUR NEGATIVE 12/31/2015 2355     RAI,RIPUDEEP M.D. [facility] 01/04/2016, 10:33 AM  Pager: [phone] Between 7am to 7pm - call Pager - [phone]  After 7pm go to [email or web address] - password [email or web address]  Call night coverage person covering after 7pm

## 2016-01-05 LAB — CBC
HCT: 33.2 % — ABNORMAL LOW (ref 36.0–46.0)
Hemoglobin: 10.7 g/dL — ABNORMAL LOW (ref 12.0–15.0)
MCH: 28.6 pg (ref 26.0–34.0)
MCHC: 32.2 g/dL (ref 30.0–36.0)
MCV: 88.8 fL (ref 78.0–100.0)
Platelets: 206 10*3/uL (ref 150–400)
RBC: 3.74 MIL/uL — ABNORMAL LOW (ref 3.87–5.11)
RDW: 14 % (ref 11.5–15.5)
WBC: 6 10*3/uL (ref 4.0–10.5)

## 2016-01-05 MED ORDER — METOPROLOL TARTRATE 50 MG PO TABS
50.0000 mg | ORAL_TABLET | Freq: Two times a day (BID) | ORAL | Status: DC
Start: 2016-01-05 — End: 2016-01-06
  Administered 2016-01-05: 50 mg via ORAL
  Filled 2016-01-05 (×2): qty 1

## 2016-01-05 MED ORDER — SODIUM CHLORIDE 0.9 % IV BOLUS (SEPSIS)
250.0000 mL | Freq: Once | INTRAVENOUS | Status: AC
  Administered 2016-01-05: 250 mL via INTRAVENOUS

## 2016-01-05 NOTE — Progress Notes (Signed)
[facility]                                                                              Patient Demographics  Andreka Branyan, is a 82 y.o. female, DOB - 1930-08-31, CS:[id number]  Admit date - 12/31/2015   Admitting Physician Karmen Bongo, MD  Outpatient Primary MD for the patient is  Melinda Crutch, MD  Outpatient specialists:   LOS - 4  days    Chief Complaint  Patient presents with  . Atrial Fibrillation  . Fall       Brief summary   BRITTNANY PACETTI is a 82 y.o. female with medical history significant of *CAD, HLD, HTN, and COPD.  She lives at home alone.  Family last spoke with her Sunday and unable to reach until finally came to check on her today (daughter was out of town).  Police helped to break into the house and she was lying in the floor.  Minimal clothes on but was able to get up to get Depends.  Minimal PO intake.  No h/o afib.  Remote h/o CABG.  No c/o pain - no injuries from the fall.  Patient reports that her foot hit something and she lost her balance.  Denies chest pains, +palpitations intermittently.  Has been c/o not feeling well.  Had appointment with Dr. Georgina Peer last week and didn't go because she wasn't feeling well enough.  Daughter reported she has been deteriorating.  Is having problems with memory.  Still driving.  Has urinary and fecal incontinence which keep her mostly at home.   Drinks wine and beer daily.  Started drinking about age 20.  Binge drinking.  Most she ever drinks in a day is a couple bottles of wine.  Denies h/o withdrawal including DTs, seizures.  Does acknowledge that she has a problem with ETOH, thinks she can quit on her own. ED Course: Diagnoses: Afib with RVR - Cardizem bolus and drip; treatment-dose Lovenox; Lopressor    Assessment & Plan   Paroxysmal New-onset afib with RVR - Currently rate controlled and in normal sinus rhythm -Patient initially required a Diltiazem drip as well as Lopressor pushes in ER but was  eventually able to be weaned off and finally converted back to NSR. - Per cardiology, continue metoprolol, amlodipine discontinued. Cardizem started - CHA2DS2-VASc Score is 5, cont lovenox. No anticoagulation per cardiology. She remains a very high fall risk, particularly if she continues drinking, Hence not on any anticoagulation - TSH normal, CTA neg for PE  Hypotension with underlying history of hypertension - DC ACE inhibitor, decrease metoprolol to 50 mg twice a day (from 100mg BID), continue Cardizem, IV fluid bolus x1  Falls -No rhabdomyolysis despite being mostly unable to move for the last 2-3 days, has frequent falls - likely multifactorial but very likely related in large part to her dementia and alcohol use  Alcohol dependence with acute withdrawls - Currently improved -By her description, her alcohol drinking is more binge drinking than daily, she is capable of consuming large amounts of ETOH - up to several bottles of wine daily - placed on scheduled ativan with CIWA protocol  and thiamine/folate - SW consult for substance abuse2. UDS negative  Memory loss -May be related to ETOH but Alzheimer's is also a consideration -She may require placement, as with falls and ETOH, may no longer be safe for her to live at home alone, PT eval pending   HLD -With h/o CAD, LDL goal is <70 - Continue statin   Mild acute renal insufficiency Creatinine 1.3 at the time of admission, improved to 1.0 with gentle hydration  Code Status: full DVT Prophylaxis:  Lovenox  Family Communication: Discussed in detail with the patient, all imaging results, lab results explained to the patient and discussed with patient's daughter in detail over the phone  Disposition Plan: Awaiting skilled nursing facility, likely DC in a.m.  Time Spent in minutes  25 minutes  Procedures:    Consultants:   cardiology  Antimicrobials:      Medications  Scheduled Meds: . aspirin EC  81 mg Oral BID    . atorvastatin  10 mg Oral QHS  . diltiazem  120 mg Oral Daily  . donepezil  10 mg Oral Daily  . DULoxetine  60 mg Oral Daily  . famotidine  40 mg Oral QHS  . folic acid  1 mg Oral Daily  . heparin subcutaneous  5,000 Units Subcutaneous Q8H  . metoprolol  50 mg Oral BID  . mirtazapine  15 mg Oral QHS  . mometasone-formoterol  2 puff Inhalation BID  . multivitamin with minerals  1 tablet Oral Daily  . pantoprazole  40 mg Oral Daily  . sodium chloride  250 mL Intravenous Once  . thiamine  100 mg Oral Daily   Continuous Infusions:  PRN Meds:.acetaminophen, ondansetron (ZOFRAN) IV   Antibiotics   Anti-infectives    None        Subjective:   Anaston Gatson was seen and examined today.Feeling better today however BP is low.Withdrawals have improved.  No chest pain. Patient denies dizziness, chest pain, shortness of breath, abdominal pain, N/V/D/C, new weakness, numbess, tingling. No acute events overnight.   Objective:   Vitals:   01/04/16 2009 01/05/16 0322 01/05/16 0959 01/05/16 1030  BP: (!) 155/50 (!) 156/45  (!) 86/60  Pulse: 60 (!) 56  (!) 57  Resp: 19 18    Temp: 97.5 F (36.4 C) 97.3 F (36.3 C)    TempSrc: Oral Oral    SpO2: 96% 94% 96%   Weight:      Height:        Intake/Output Summary (Last 24 hours) at 01/05/16 1144 Last data filed at 01/05/16 0845  Gross per 24 hour  Intake              958 ml  Output                0 ml  Net              958 ml     Wt Readings from Last 3 Encounters:  01/01/16 62.2 kg (137 lb 2 oz)  09/10/15 64 kg (141 lb 1 oz)  08/05/15 59.9 kg (132 lb)     Exam  General: Alert and oriented x 3, NAD  HEENT:    Neck: Supple, no JVD  Cardiovascular: S1 S2 clear, RRR  Respiratory: CTAB  Gastrointestinal: Soft, nontender, nondistended, + bowel sounds  Ext: no cyanosis clubbing or edema. No significant tremulousness  Neuro:no new deficits   Skin: No rashes  Psych: Normal affect and demeanor, alert and oriented x3  Data Reviewed:  I have personally reviewed following labs and imaging studies  Micro Results No results found for this or any previous visit (from the past 240 hour(s)).  Radiology Reports Dg Chest 2 View  Result Date: 12/31/2015 CLINICAL DATA:  82 year old female with shortness of breath. atrial fibrillation. Fall 3 days ago. Initial encounter. EXAM: CHEST  2 VIEW COMPARISON:  07/11/2009 and earlier. FINDINGS: Semi upright AP and lateral views of the chest. Increase cardiomegaly. Stable sequelae of CABG. No pneumothorax. No pulmonary edema or pleural effusion. No confluent pulmonary opacity. Eventration of the diaphragm again noted. New line osteopenia. Visualized tracheal air column is within normal limits. No acute osseous abnormality identified. IMPRESSION: No acute cardiopulmonary abnormality. Cardiomegaly has progressed since 2011. Calcified aortic atherosclerosis. Electronically Signed   By: Genevie Ann M.D.   On: 12/31/2015 21:29   Ct Head Wo Contrast  Result Date: 12/31/2015 CLINICAL DATA:  Status post fall. May have been on the ground for 2 days. Initial encounter. EXAM: CT HEAD WITHOUT CONTRAST TECHNIQUE: Contiguous axial images were obtained from the base of the skull through the vertex without intravenous contrast. COMPARISON:  CT of the head performed 07/11/2009 FINDINGS: Brain: There is no evidence of acute infarction, mass lesion, or intra- or extra-axial hemorrhage on CT. Prominence of the ventricles and sulci reflects moderate cortical volume loss. Mild cerebellar atrophy is noted. A chronic lacunar infarct is noted at the right cerebellar hemisphere. The brainstem and fourth ventricle are within normal limits. The basal ganglia are unremarkable in appearance. The cerebral hemispheres demonstrate grossly normal gray-white differentiation. No mass effect or midline shift is seen. Vascular: No hyperdense vessel or unexpected calcification. Skull: There is no evidence of fracture;  visualized osseous structures are unremarkable in appearance. Sinuses/Orbits: The visualized portions of the orbits are within normal limits. The paranasal sinuses and mastoid air cells are well-aerated. Other: Mild soft tissue swelling is noted lateral to the left orbit. IMPRESSION: 1. No acute intracranial pathology seen on CT. 2. Mild soft tissue swelling lateral to the left orbit. 3. Moderate cortical volume loss noted. 4. Chronic lacunar infarct at the right cerebellar hemisphere. Electronically Signed   By: Garald Balding M.D.   On: 12/31/2015 23:48   Ct Angio Chest Pe W And/or Wo Contrast  Result Date: 01/01/2016 CLINICAL DATA:  82 y/o F; shortness of breath and positive D-dimer. History of atrial fibrillation, hypertension, and coronary artery disease. EXAM: CT ANGIOGRAPHY CHEST WITH CONTRAST TECHNIQUE: Multidetector CT imaging of the chest was performed using the standard protocol during bolus administration of intravenous contrast. Multiplanar CT image reconstructions and MIPs were obtained to evaluate the vascular anatomy. CONTRAST:  100 cc Isovue 370. COMPARISON:  Chest radiograph dated 12/31/2015. Chest CT dated 7/8/8. FINDINGS: Mediastinum/Lymph Nodes: No pulmonary emboli or thoracic aortic dissection identified. No masses or pathologically enlarged lymph nodes identified. Cardiac: Mild coronary artery calcifications. Postsurgical changes related to a LIMA bypass graft and saphenous graft. Healed median sternotomy. No cardiomegaly. No pericardial effusion. Lungs/Pleura: No pulmonary mass, infiltrate, or effusion. Mild centrilobular emphysema with upper lobe predominance. Upper abdomen: Gallstones.  No acute findings. Musculoskeletal: No chest wall mass or suspicious bone lesions identified. Chronic left anterior rib fracture deformities. Multilevel degenerative changes of the spine. Flowing anterior ossification of multiple thoracic vertebral bodies compatible with DISH. Review of the MIP images  confirms the above findings. IMPRESSION: 1. No pulmonary embolus is identified.  No acute pulmonary process. 2. Mild emphysema. 3. Gallstones. Electronically Signed   By: Mia Creek  [place]-Stratton M.D.   On: 01/01/2016 03:06    Lab Data:  CBC:  Recent Labs Lab 12/31/15 2042 01/02/16 0450 01/05/16 0550  WBC 13.0* 6.7 6.0  HGB 13.3 11.0* 10.7*  HCT 39.1 34.3* 33.2*  MCV 85.6 89.1 88.8  PLT 293 237 [id number]   Basic Metabolic Panel:  Recent Labs Lab 12/31/15 2042 01/02/16 0450 01/03/16 0304 01/04/16 0218  NA 129* 135 134* 135  K 4.6 4.2 4.2 4.2  CL 93* 101 102 99*  CO2 21* 27 26 24   GLUCOSE 116* 90 122* 88  BUN 8 7 14 14   CREATININE 1.33* 1.02* 1.05* 1.02*  CALCIUM 8.6* 8.4* 8.2* 8.5*   GFR: Estimated Creatinine Clearance: 36.2 mL/min (by C-G formula based on SCr of 1.02 mg/dL). Liver Function Tests:  Recent Labs Lab 01/03/16 0304  AST 20  ALT 17  ALKPHOS 65  BILITOT 0.3  PROT 4.8*  ALBUMIN 2.3*   No results for input(s): LIPASE, AMYLASE in the last 168 hours. No results for input(s): AMMONIA in the last 168 hours. Coagulation Profile: No results for input(s): INR, PROTIME in the last 168 hours. Cardiac Enzymes:  Recent Labs Lab 12/31/15 2042  CKTOTAL 195   BNP (last 3 results) No results for input(s): PROBNP in the last 8760 hours. HbA1C: No results for input(s): HGBA1C in the last 72 hours. CBG: No results for input(s): GLUCAP in the last 168 hours. Lipid Profile:  Recent Labs  01/03/16 0304  CHOL 118  HDL 43  LDLCALC 54  TRIG 106  CHOLHDL 2.7   Thyroid Function Tests: No results for input(s): TSH, T4TOTAL, FREET4, T3FREE, THYROIDAB in the last 72 hours. Anemia Panel: No results for input(s): VITAMINB12, FOLATE, FERRITIN, TIBC, IRON, RETICCTPCT in the last 72 hours. Urine analysis:    Component Value Date/Time   COLORURINE YELLOW 12/31/2015 2355   APPEARANCEUR CLEAR 12/31/2015 2355   LABSPEC 1.015 12/31/2015 2355   PHURINE 5.5 12/31/2015  2355   GLUCOSEU NEGATIVE 12/31/2015 2355   HGBUR NEGATIVE 12/31/2015 2355   BILIRUBINUR SMALL (A) 12/31/2015 2355   KETONESUR 15 (A) 12/31/2015 2355   PROTEINUR NEGATIVE 12/31/2015 2355   UROBILINOGEN 0.2 07/11/2009 2220   NITRITE NEGATIVE 12/31/2015 2355   LEUKOCYTESUR NEGATIVE 12/31/2015 2355     Zell Doucette M.D. [facility] 01/05/2016, 11:44 AM  Pager: [phone] Between 7am to 7pm - call Pager - [phone]  After 7pm go to [email or web address] - password [email or web address]  Call night coverage person covering after 7pm

## 2016-01-05 NOTE — Progress Notes (Signed)
Physical Therapy Treatment Patient Details Name: Tammy [name] MRN: [id number] DOB: [birth year] Today's Date: 01/05/2016    History of Present Illness Pt adm after fall at home and lying on floor for several days. Pt found to have afib with rvr. Pt also with ETOH abuse and possible dementia. PMH - HTN, CAD, COPD    PT Comments    Pt was up to walk with PT in good spirits, better tolerance and increased endurance with better balance as compared to last visit.  Her plan is SNF and she is progressing such that a shorter stay can be anticipated for recovery.  Continue acutely for gait and strengthening, and to encourage pt to be up standing and walking with nursing staff as able.    Follow Up Recommendations  SNF     Equipment Recommendations  None recommended by PT (SNF to determine)    Recommendations for Other Services Rehab consult     Precautions / Restrictions Precautions Precautions: Fall Restrictions Weight Bearing Restrictions: No    Mobility  Bed Mobility               General bed mobility comments: up when PT entered  Transfers Overall transfer level: Needs assistance Equipment used: Rolling walker (2 wheeled);1 person hand held assist Transfers: Sit to/from [facility] Sit to Stand: Min guard;Min assist Stand pivot transfers: Min guard       General transfer comment: cues for hand placement and minor power up help, then pt needed contact to steady slightly in standing  Ambulation/Gait Ambulation/Gait assistance: Min guard Ambulation Distance (Feet): 160 Feet Assistive device: Rolling walker (2 wheeled);1 person hand held assist Gait Pattern/deviations: Step-to pattern;Step-through pattern;Decreased stride length;Wide base of support;Trunk flexed Gait velocity: reduced Gait velocity interpretation: Below normal speed for age/gender General Gait Details: more confident with steps than prior PT visit, had increased her distance with better  tolerance for gait and minimal SOB but was controlled for pulse from 72 to 90 with end of gait   Stairs            Wheelchair Mobility    Modified Rankin (Stroke Patients Only)       Balance Overall balance assessment: Needs assistance Sitting-balance support: Feet supported Sitting balance-Leahy Scale: Fair   Postural control: Posterior lean Standing balance support: Bilateral upper extremity supported Standing balance-Leahy Scale: Fair                      Cognition Arousal/Alertness: Awake/alert Behavior During Therapy: WFL for tasks assessed/performed Overall Cognitive Status: No family/caregiver present to determine baseline cognitive functioning Area of Impairment: Memory;Safety/judgement;Awareness     Memory: Decreased recall of precautions;Decreased short-term memory   Safety/Judgement: Decreased awareness of safety;Decreased awareness of deficits Awareness: Intellectual Problem Solving: Slow processing General Comments: Pt is not in full recall of recent history but did state she had been walking just before hospital admission    Exercises General Exercises - Lower Extremity Ankle Circles/Pumps: AROM;Both;10 reps Long Arc Quad: AROM;Both;10 reps Heel Slides: AROM;Both;10 reps Hip Flexion/Marching: AROM;Both;10 reps    General Comments        Pertinent Vitals/Pain Pain Assessment: No/denies pain    Home Living                      Prior Function            PT Goals (current goals can now be found in the care plan section) Acute Rehab PT Goals  Patient Stated Goal: to get up and work  PT Goal Formulation: With patient Progress towards PT goals: Progressing toward goals    Frequency  Min 3X/week    PT Plan Current plan remains appropriate    Co-evaluation             End of Session Equipment Utilized During Treatment: Gait belt Activity Tolerance: Patient limited by fatigue Patient left: in chair;with call  bell/phone within reach;with chair alarm set;with family/visitor present     Time: [id number] PT Time Calculation (min) (ACUTE ONLY): 17 min  Charges:  $Gait Training: 8-22 mins                    G Codes:      Ramond Dial Jan 14, 2016, 10:55 AM    Mee Hives, PT MS Acute Rehab Dept. Number: [place] [facility] [place]

## 2016-01-05 NOTE — Clinical Social Work Note (Signed)
Daughter has met with liaison and has agreed to placement at [facility].  MD projects dc tomorrow morning.  DC cancelled for today due to BP.  Per MD, meds adjusted and if stable, plan to dc tomorrow morning.  CSW reviewed discharge plans with daughter who is agreeable to placement.  Patient's daughter was educated regarding the insurance process with [employer].  Authorization generally takes a few days and patient will discharge tomorrow per MD.  Daughter is aware of this process and agreeable to speak with Tammy, liaison for SNFs who would potentially accept a LOG for patient to be admitted without insurance auth.

## 2016-01-05 NOTE — Care Management Important Message (Signed)
Important Message  Patient Details  Name: Tammy [name] MRN: [id number] Date of Birth: Aug 26, 1930   Medicare Important Message Given:  Yes    Najeh Credit Abena 01/05/2016, 11:11 AM

## 2016-01-06 MED ORDER — DILTIAZEM HCL ER COATED BEADS 120 MG PO CP24: 120.0000 mg | ORAL_CAPSULE | Freq: Every day | ORAL | Status: DC

## 2016-01-06 MED ORDER — QUINAPRIL HCL 20 MG PO TABS: 20.0000 mg | ORAL_TABLET | Freq: Every day | ORAL | Status: DC

## 2016-01-06 MED ORDER — LISINOPRIL 10 MG PO TABS
20.0000 mg | ORAL_TABLET | Freq: Every day | ORAL | Status: DC
  Administered 2016-01-06: 20 mg via ORAL
  Filled 2016-01-06: qty 2

## 2016-01-06 MED ORDER — METOPROLOL TARTRATE 50 MG PO TABS
50.0000 mg | ORAL_TABLET | Freq: Two times a day (BID) | ORAL | Status: DC
  Administered 2016-01-06: 50 mg via ORAL

## 2016-01-06 MED ORDER — THIAMINE HCL 100 MG PO TABS: 100.0000 mg | ORAL_TABLET | Freq: Every day | ORAL | Status: DC

## 2016-01-06 MED ORDER — FOLIC ACID 1 MG PO TABS: 1.0000 mg | ORAL_TABLET | Freq: Every day | ORAL | Status: AC

## 2016-01-06 MED ORDER — ATORVASTATIN CALCIUM 10 MG PO TABS: 10.0000 mg | ORAL_TABLET | Freq: Every day | ORAL | Status: DC

## 2016-01-06 MED ORDER — METOPROLOL TARTRATE 50 MG PO TABS: 75.0000 mg | ORAL_TABLET | Freq: Two times a day (BID) | ORAL | Status: DC

## 2016-01-06 MED ORDER — METOPROLOL TARTRATE 50 MG PO TABS: 50.0000 mg | ORAL_TABLET | Freq: Two times a day (BID) | ORAL | Status: DC

## 2016-01-06 NOTE — Progress Notes (Signed)
Patient in stable condition, taken off the unit to [facility] by [facility]

## 2016-01-06 NOTE — Clinical Social Work Placement (Signed)
   CLINICAL SOCIAL WORK PLACEMENT  NOTE  Date:  01/06/2016  Patient Details  Name: Tammy [name] MRN: [id number] Date of Birth: 03/26/1931  Clinical Social Work is seeking post-discharge placement for this patient at the [place] level of care (*CSW will initial, date and re-position this form in  chart as items are completed):  Yes   Patient/family provided with [place] Work Department's list of facilities offering this level of care within the geographic area requested by the patient (or if unable, by the patient's family).  Yes   Patient/family informed of their freedom to choose among providers that offer the needed level of care, that participate in Medicare, Medicaid or managed care program needed by the patient, have an available bed and are willing to accept the patient.  Yes   Patient/family informed of [facility]'s ownership interest in [facility] and [facility], as well as of the fact that they are under no obligation to receive care at these facilities.  PASRR submitted to EDS on 01/04/16     PASRR number received on 01/04/16     Existing PASRR number confirmed on       FL2 transmitted to all facilities in geographic area requested by pt/family on 01/04/16     FL2 transmitted to all facilities within larger geographic area on       Patient informed that his/her managed care company has contracts with or will negotiate with certain facilities, including the following:        Yes   Patient/family informed of bed offers received.  Patient chooses bed at [facility] and [place] recommends and patient chooses bed at      Patient to be transferred to [facility] on 01/06/16.  Patient to be transferred to facility by [facility]     Patient family notified on 01/06/16 of transfer.  Name of family member notified:  Pt's daughter, Jackelyn Poling     PHYSICIAN       Additional Comment:     _______________________________________________ Darden Dates, LCSW 01/06/2016, 4:31 PM

## 2016-01-06 NOTE — Discharge Summary (Signed)
Physician Discharge Summary   Patient ID: Tammy [name] MRN: [id number] DOB/AGE: 12-22-1930 82 y.o.  Admit date: 12/31/2015 Discharge date: 01/06/2016  Primary Care Physician:   Melinda Crutch, MD  Discharge Diagnoses:    . Atrial fibrillation with RVR ([place]) . Essential hypertension . Hyperlipidemia with target LDL less than 70 . Memory loss . Alcohol dependence with withdrawals . Fall   Consults: None  Recommendations for Outpatient Follow-up:  1. Please repeat CBC/BMET at next visit   DIET: Heart healthy diet    Allergies:   Allergies  Allergen Reactions  . Penicillins Rash and Hives     DISCHARGE MEDICATIONS: Current Discharge Medication List    START taking these medications   Details  atorvastatin (LIPITOR) 10 MG tablet Take 1 tablet (10 mg total) by mouth at bedtime.    diltiazem (CARDIZEM CD) 120 MG 24 hr capsule Take 1 capsule (120 mg total) by mouth daily.    folic acid (FOLVITE) 1 MG tablet Take 1 tablet (1 mg total) by mouth daily.    thiamine 100 MG tablet Take 1 tablet (100 mg total) by mouth daily.      CONTINUE these medications which have CHANGED   Details  metoprolol (LOPRESSOR) 50 MG tablet Take 1 tablet (50 mg total) by mouth 2 (two) times daily.    quinapril (ACCUPRIL) 20 MG tablet Take 1 tablet (20 mg total) by mouth daily.      CONTINUE these medications which have NOT CHANGED   Details  aspirin 81 MG tablet Take 81 mg by mouth daily. Take 2 tablets twice daily    donepezil (ARICEPT) 10 MG tablet Take 1 tablet (10 mg total) by mouth daily. Qty: 90 tablet, Refills: 1    DULoxetine (CYMBALTA) 60 MG capsule Take 1 capsule by mouth daily. Reported on 08/05/2015 Refills: 2    fluticasone (FLONASE) 50 MCG/ACT nasal spray USE 1 TO 2 SPRAYS IN EACH NOSTRIL ONCE DAILY FOR STUFFY NOSE OR DRAINAGE Qty: 16 g, Refills: 2    furosemide (LASIX) 20 MG tablet Take 1 tablet (20 mg total) by mouth daily. Qty: 30 tablet, Refills: 6    mirtazapine  (REMERON) 15 MG tablet Take 15 mg by mouth at bedtime. Reported on 08/05/2015    omeprazole (PRILOSEC) 20 MG capsule Take 1 capsule (20 mg total) by mouth 2 (two) times daily before a meal. Qty: 60 capsule, Refills: 5    ranitidine (ZANTAC) 300 MG tablet Take 1 tablet (300 mg total) by mouth at bedtime. Qty: 90 tablet, Refills: 3    VENTOLIN HFA 108 (90 BASE) MCG/ACT inhaler     etodolac (LODINE) 400 MG tablet Take 400 mg by mouth 2 (two) times daily.      mometasone-formoterol (DULERA) 100-5 MCG/ACT AERO Inhale 2 puffs into the lungs 2 (two) times daily.      STOP taking these medications     amLODipine (NORVASC) 10 MG tablet      simvastatin (ZOCOR) 20 MG tablet          Brief H and P: For complete details please refer to admission H and P, but in briefPearl M [name] a 82 y.o.femalewith medical history significant of *CAD, HLD, HTN, and COPD. She lives at home alone. Family last spoke with her Sunday and unable to reach until finally came to check on her today (daughter was out of town). Police helped to break into the house andshe was lying in the floor. Minimal clothes on but was able to  get up to get Depends. Minimal PO intake. No h/o afib. Remote h/o CABG. No c/o pain - no injuries from the fall. Patient reports that her foot hit something and she lost her balance. Denies chest pains, +palpitations intermittently. Has been c/o not feeling well. Had appointment with Dr. Georgina Peer last week and didn't go because she wasn't feeling well enough.  Daughter reported she has been deteriorating. Is having problems with memory. Still driving. Has urinary and fecal incontinence which keep her mostly at home.  Drinks wine and beer daily. Started drinking about age 47. Binge drinking. Most she ever drinks in a day is a couple bottles of wine. Denies h/o withdrawal including DTs, seizures. Does acknowledge that she has a problem with ETOH, thinks she can quiton her  own. ED Course:Diagnoses: Afib with RVR - Cardizem bolus and drip; treatment-dose Lovenox; Lopressor  Hospital Course:  Paroxysmal New-onset afib with RVR - Currently rate controlled and in normal sinus rhythm -Patient initially required a Diltiazem drip as well as Lopressor pushes in ER but was eventually able to be weaned off and finally converted back to NSR. - Per cardiology, continue metoprolol, amlodipine discontinued. Cardizem started - CHA2DS2-VASc Score is 5. No anticoagulation per cardiology. She remains a very high fall risk, particularly if she continues drinking, Hence not on any anticoagulation - TSH normal, CTA neg for PE  hypertension - Continue metoprolol, ACE inhibitor, Cardizem, adjust doses per BP readings   Falls -No rhabdomyolysis despite being mostly unable to move for the last 2-3 days prior to admission, has frequent falls - likely multifactorial but very likely related in large part to her dementia and alcohol use  Alcohol dependence with acute withdrawls - Currently improved -By her description, her alcohol drinking is more binge drinking than daily, she is capable of consuming large amounts of ETOH - up to several bottles of wine daily - Patient was placed on scheduled ativan with CIWA protocol and thiamine/folate. She has completed alcohol withdrawal protocol.  Memory loss -May be related to ETOH but Alzheimer's is also a consideration -PT evaluation recommended skilled nursing facility  HLD -With h/o CAD, LDL goal is <70 - Continue statin   Mild acute renal insufficiency Creatinine 1.3 at the time of admission, improved to 1.0 with gentle hydration  Day of Discharge BP (!) 139/42   Pulse (!) 59   Temp 97.8 F (36.6 C) (Oral)   Resp 18   Ht 5\' 2"  (1.575 m)   Wt 62.2 kg (137 lb 2 oz)   SpO2 96%   BMI 25.08 kg/m   Physical Exam: General: Alert and awake oriented x3 not in any acute distress. HEENT: anicteric sclera, pupils reactive to  light and accommodation CVS: S1-S2 clear no murmur rubs or gallops Chest: clear to auscultation bilaterally, no wheezing rales or rhonchi Abdomen: soft nontender, nondistended, normal bowel sounds Extremities: no cyanosis, clubbing or edema noted bilaterally Neuro: Cranial nerves II-XII intact, no focal neurological deficits   The results of significant diagnostics from this hospitalization (including imaging, microbiology, ancillary and laboratory) are listed below for reference.    LAB RESULTS: Basic Metabolic Panel:  Recent Labs Lab 01/03/16 0304 01/04/16 0218  NA 134* 135  K 4.2 4.2  CL 102 99*  CO2 26 24  GLUCOSE 122* 88  BUN 14 14  CREATININE 1.05* 1.02*  CALCIUM 8.2* 8.5*   Liver Function Tests:  Recent Labs Lab 01/03/16 0304  AST 20  ALT 17  ALKPHOS 65  BILITOT  0.3  PROT 4.8*  ALBUMIN 2.3*   No results for input(s): LIPASE, AMYLASE in the last 168 hours. No results for input(s): AMMONIA in the last 168 hours. CBC:  Recent Labs Lab 01/02/16 0450 01/05/16 0550  WBC 6.7 6.0  HGB 11.0* 10.7*  HCT 34.3* 33.2*  MCV 89.1 88.8  PLT 237 206   Cardiac Enzymes:  Recent Labs Lab 12/31/15 2042  CKTOTAL 195   BNP: Invalid input(s): POCBNP CBG: No results for input(s): GLUCAP in the last 168 hours.  Significant Diagnostic Studies:  Dg Chest 2 View  Result Date: 12/31/2015 CLINICAL DATA:  82 year old female with shortness of breath. atrial fibrillation. Fall 3 days ago. Initial encounter. EXAM: CHEST  2 VIEW COMPARISON:  07/11/2009 and earlier. FINDINGS: Semi upright AP and lateral views of the chest. Increase cardiomegaly. Stable sequelae of CABG. No pneumothorax. No pulmonary edema or pleural effusion. No confluent pulmonary opacity. Eventration of the diaphragm again noted. New line osteopenia. Visualized tracheal air column is within normal limits. No acute osseous abnormality identified. IMPRESSION: No acute cardiopulmonary abnormality. Cardiomegaly has  progressed since 2011. Calcified aortic atherosclerosis. Electronically Signed   By: Genevie Ann M.D.   On: 12/31/2015 21:29   Ct Head Wo Contrast  Result Date: 12/31/2015 CLINICAL DATA:  Status post fall. May have been on the ground for 2 days. Initial encounter. EXAM: CT HEAD WITHOUT CONTRAST TECHNIQUE: Contiguous axial images were obtained from the base of the skull through the vertex without intravenous contrast. COMPARISON:  CT of the head performed 07/11/2009 FINDINGS: Brain: There is no evidence of acute infarction, mass lesion, or intra- or extra-axial hemorrhage on CT. Prominence of the ventricles and sulci reflects moderate cortical volume loss. Mild cerebellar atrophy is noted. A chronic lacunar infarct is noted at the right cerebellar hemisphere. The brainstem and fourth ventricle are within normal limits. The basal ganglia are unremarkable in appearance. The cerebral hemispheres demonstrate grossly normal gray-white differentiation. No mass effect or midline shift is seen. Vascular: No hyperdense vessel or unexpected calcification. Skull: There is no evidence of fracture; visualized osseous structures are unremarkable in appearance. Sinuses/Orbits: The visualized portions of the orbits are within normal limits. The paranasal sinuses and mastoid air cells are well-aerated. Other: Mild soft tissue swelling is noted lateral to the left orbit. IMPRESSION: 1. No acute intracranial pathology seen on CT. 2. Mild soft tissue swelling lateral to the left orbit. 3. Moderate cortical volume loss noted. 4. Chronic lacunar infarct at the right cerebellar hemisphere. Electronically Signed   By: Garald Balding M.D.   On: 12/31/2015 23:48   Ct Angio Chest Pe W And/or Wo Contrast  Result Date: 01/01/2016 CLINICAL DATA:  82 y/o F; shortness of breath and positive D-dimer. History of atrial fibrillation, hypertension, and coronary artery disease. EXAM: CT ANGIOGRAPHY CHEST WITH CONTRAST TECHNIQUE: Multidetector CT  imaging of the chest was performed using the standard protocol during bolus administration of intravenous contrast. Multiplanar CT image reconstructions and MIPs were obtained to evaluate the vascular anatomy. CONTRAST:  100 cc Isovue 370. COMPARISON:  Chest radiograph dated 12/31/2015. Chest CT dated 7/8/8. FINDINGS: Mediastinum/Lymph Nodes: No pulmonary emboli or thoracic aortic dissection identified. No masses or pathologically enlarged lymph nodes identified. Cardiac: Mild coronary artery calcifications. Postsurgical changes related to a LIMA bypass graft and saphenous graft. Healed median sternotomy. No cardiomegaly. No pericardial effusion. Lungs/Pleura: No pulmonary mass, infiltrate, or effusion. Mild centrilobular emphysema with upper lobe predominance. Upper abdomen: Gallstones.  No acute findings. Musculoskeletal: No chest  wall mass or suspicious bone lesions identified. Chronic left anterior rib fracture deformities. Multilevel degenerative changes of the spine. Flowing anterior ossification of multiple thoracic vertebral bodies compatible with DISH. Review of the MIP images confirms the above findings. IMPRESSION: 1. No pulmonary embolus is identified.  No acute pulmonary process. 2. Mild emphysema. 3. Gallstones. Electronically Signed   By: Kristine Garbe M.D.   On: 01/01/2016 03:06    2D ECHO:   Disposition and Follow-up: Discharge Instructions    Diet - low sodium heart healthy    Complete by:  As directed   Increase activity slowly    Complete by:  As directed       DISPOSITION: SNF  DISCHARGE FOLLOW-UP  Contact information for follow-up providers     Melinda Crutch, MD. Schedule an appointment as soon as possible for a visit in 2 week(s).   Specialty:  Family Medicine Contact information: [place] [address] [phone]            Contact information for after-discharge care    [facility] .   Specialty:   [place] Contact information: [address] [place] [phone]                   Time spent on Discharge: 51mins   Signed:   RAI,RIPUDEEP M.D. [facility] 01/06/2016, 1:08 PM Pager: [phone]

## 2016-01-06 NOTE — Clinical Social Work Note (Signed)
RN Report [facility] via [facility] Rm# [id number] Report# [phone] (station 1)  Pt is ready for discharge today to [facility] and rehab via [place]. [facility] has given auth. Facility has received discharge information and is ready admit pt. Pt and daughter are aware and agreeable to discharge plan. CSW provided information to pt's daughter regarding possible LTC. RN will call report. CSW is signing off as no further needs identified.   Darden Dates, MSW, LCSW  Clinical Social Worker  [phone]

## 2016-02-11 ENCOUNTER — Encounter (HOSPITAL_COMMUNITY): Payer: Self-pay

## 2016-02-11 ENCOUNTER — Observation Stay (HOSPITAL_BASED_OUTPATIENT_CLINIC_OR_DEPARTMENT_OTHER)
Admission: EM | Admit: 2016-02-11 | Discharge: 2016-02-13 | Disposition: A | Discharge status: Home Health | Payer: Medicare HMO | Source: Home / Self Care | Attending: Emergency Medicine | Admitting: Emergency Medicine

## 2016-02-11 ENCOUNTER — Emergency Department (HOSPITAL_COMMUNITY): Payer: Medicare HMO

## 2016-02-11 DIAGNOSIS — K5790 Diverticulosis of intestine, part unspecified, without perforation or abscess without bleeding: Secondary | ICD-10-CM | POA: Diagnosis not present

## 2016-02-11 DIAGNOSIS — N179 Acute kidney failure, unspecified: Secondary | ICD-10-CM | POA: Diagnosis not present

## 2016-02-11 DIAGNOSIS — Z7982 Long term (current) use of aspirin: Secondary | ICD-10-CM | POA: Insufficient documentation

## 2016-02-11 DIAGNOSIS — I25119 Atherosclerotic heart disease of native coronary artery with unspecified angina pectoris: Secondary | ICD-10-CM | POA: Insufficient documentation

## 2016-02-11 DIAGNOSIS — R262 Difficulty in walking, not elsewhere classified: Secondary | ICD-10-CM

## 2016-02-11 DIAGNOSIS — E785 Hyperlipidemia, unspecified: Secondary | ICD-10-CM

## 2016-02-11 DIAGNOSIS — R413 Other amnesia: Secondary | ICD-10-CM | POA: Diagnosis present

## 2016-02-11 DIAGNOSIS — N289 Disorder of kidney and ureter, unspecified: Secondary | ICD-10-CM

## 2016-02-11 DIAGNOSIS — Z880 Allergy status to penicillin: Secondary | ICD-10-CM | POA: Insufficient documentation

## 2016-02-11 DIAGNOSIS — J449 Chronic obstructive pulmonary disease, unspecified: Secondary | ICD-10-CM | POA: Insufficient documentation

## 2016-02-11 DIAGNOSIS — Z87891 Personal history of nicotine dependence: Secondary | ICD-10-CM | POA: Insufficient documentation

## 2016-02-11 DIAGNOSIS — R001 Bradycardia, unspecified: Secondary | ICD-10-CM | POA: Insufficient documentation

## 2016-02-11 DIAGNOSIS — K219 Gastro-esophageal reflux disease without esophagitis: Secondary | ICD-10-CM | POA: Insufficient documentation

## 2016-02-11 DIAGNOSIS — I480 Paroxysmal atrial fibrillation: Secondary | ICD-10-CM | POA: Insufficient documentation

## 2016-02-11 DIAGNOSIS — I5032 Chronic diastolic (congestive) heart failure: Secondary | ICD-10-CM | POA: Insufficient documentation

## 2016-02-11 DIAGNOSIS — F32A Depression, unspecified: Secondary | ICD-10-CM | POA: Diagnosis present

## 2016-02-11 DIAGNOSIS — R0602 Shortness of breath: Secondary | ICD-10-CM

## 2016-02-11 DIAGNOSIS — Z951 Presence of aortocoronary bypass graft: Secondary | ICD-10-CM

## 2016-02-11 DIAGNOSIS — I10 Essential (primary) hypertension: Secondary | ICD-10-CM | POA: Diagnosis present

## 2016-02-11 DIAGNOSIS — D72829 Elevated white blood cell count, unspecified: Secondary | ICD-10-CM | POA: Insufficient documentation

## 2016-02-11 DIAGNOSIS — F329 Major depressive disorder, single episode, unspecified: Secondary | ICD-10-CM | POA: Diagnosis present

## 2016-02-11 HISTORY — DX: Irritable bowel syndrome, unspecified: K58.9

## 2016-02-11 LAB — BASIC METABOLIC PANEL
Anion gap: 10 (ref 5–15)
BUN: 38 mg/dL — ABNORMAL HIGH (ref 6–20)
CO2: 20 mmol/L — ABNORMAL LOW (ref 22–32)
Calcium: 9 mg/dL (ref 8.9–10.3)
Chloride: 104 mmol/L (ref 101–111)
Creatinine, Ser: 1.74 mg/dL — ABNORMAL HIGH (ref 0.44–1.00)
GFR calc Af Amer: 30 mL/min — ABNORMAL LOW (ref >60)
GFR calc non Af Amer: 26 mL/min — ABNORMAL LOW (ref >60)
Glucose, Bld: 142 mg/dL — ABNORMAL HIGH (ref 65–99)
Potassium: 3.9 mmol/L (ref 3.5–5.1)
Sodium: 134 mmol/L — ABNORMAL LOW (ref 135–145)

## 2016-02-11 LAB — CBC
HCT: 37.3 % (ref 36.0–46.0)
Hemoglobin: 12.1 g/dL (ref 12.0–15.0)
MCH: 28.2 pg (ref 26.0–34.0)
MCHC: 32.4 g/dL (ref 30.0–36.0)
MCV: 86.9 fL (ref 78.0–100.0)
Platelets: 337 10*3/uL (ref 150–400)
RBC: 4.29 MIL/uL (ref 3.87–5.11)
RDW: 14.3 % (ref 11.5–15.5)
WBC: 17.3 10*3/uL — ABNORMAL HIGH (ref 4.0–10.5)

## 2016-02-11 LAB — I-STAT TROPONIN, ED: Troponin i, poc: 0 ng/mL (ref 0.00–0.08)

## 2016-02-11 LAB — URINALYSIS, ROUTINE W REFLEX MICROSCOPIC
Bilirubin Urine: NEGATIVE
Glucose, UA: NEGATIVE mg/dL
Hgb urine dipstick: NEGATIVE
Ketones, ur: NEGATIVE mg/dL
Leukocytes, UA: NEGATIVE
Nitrite: NEGATIVE
Protein, ur: NEGATIVE mg/dL
Specific Gravity, Urine: 1.015 (ref 1.005–1.030)
pH: 5.5 (ref 5.0–8.0)

## 2016-02-11 LAB — CBG MONITORING, ED: Glucose-Capillary: 147 mg/dL — ABNORMAL HIGH (ref 65–99)

## 2016-02-11 MED ORDER — SODIUM CHLORIDE 0.9 % IV BOLUS (SEPSIS)
500.0000 mL | Freq: Once | INTRAVENOUS | Status: AC
  Administered 2016-02-12: 500 mL via INTRAVENOUS

## 2016-02-11 NOTE — ED Notes (Signed)
EKG given to EDP,Pickering, MD., for review. 

## 2016-02-11 NOTE — ED Notes (Signed)
Writer was unable to draw blood work, 2x unsuccessful attempt

## 2016-02-11 NOTE — ED Provider Notes (Signed)
[facility] DEPT Provider Note   CSN: [id number] Arrival date & time: 02/11/16  1930     History   Chief Complaint Chief Complaint  Patient presents with  . Dizziness  . Shortness of Breath    HPI Tammy [name] is a 82 y.o. female.  HPI Patient presents with shortness of breath exertion and dizziness when standing. Had it for the last few days. Occasional cough. Just Discharged from rehabilitation after recent admission for atrial fibrillation. She is in sinus rhythm. No fevers. No dysuria. Had been started on Cardizem. No dysuria. She is on clindamycin for cellulitis of her legs. States her legs are feeling better. The dizziness is there when she stands up but not when she turns her head.  HEENT kidney stone   Past Medical History:  Diagnosis Date  . Acid reflux   . Asthma    Asthmatic COPD  . Atrial fibrillation ([place]) 01/01/2016  . Coronary artery disease    Echo 07/27/2011   . Coronary artery stenosis    , High-grade left main  . GERD (gastroesophageal reflux disease)   . Hyperlipidemia   . Hypertension   . Psoriasis     Patient Active Problem List   Diagnosis Date Noted  . Sinus bradycardia 02/11/2016  . Atrial fibrillation with RVR ([place]) 01/01/2016  . PAF (paroxysmal atrial fibrillation) ([place]) 01/01/2016  . Alcohol dependence with withdrawal with complication ([place]) [id number]  . Fall 01/01/2016  . AKI (acute kidney injury) ([place])   . Dehydration   . Exertional dyspnea 09/11/2015  . Memory loss 11/13/2013  . Abnormal involuntary movements(781.0) 11/13/2013  . Pulmonary hypertension ([place]) 11/02/2013  . Fatigue 11/02/2013  . Coronary artery disease involving native coronary artery of native heart with angina pectoris ([place]) 11/16/2012  . Mitral regurgitation 11/16/2012  . Renal artery stenosis ([place]) 11/16/2012  . Hyperlipidemia with target LDL less than 70 11/16/2012  . Essential hypertension 11/16/2012    Past Surgical History:  Procedure Laterality  Date  . CARDIAC SURGERY    . CORONARY ARTERY BYPASS GRAFT  1994   After catheterization showed 90% ostial left main stenosis  . DOPPLER ECHOCARDIOGRAPHY     Study showed mild to moderate MR with moderate TR, mild to moderate pulmonary hypertension with an ejection fraction greater that 55%.   [name] EYE SURGERY    . Nuclear Perfusion  10/2010, 11/15/2012   Showed normal perfusion, No Ischemia or Infarction, Normal EF  . Renal Scan Duplex  04/09/2012   Showing greater than 50% diameter reduction in the celiac artery and SMA.. Right proximal 275 and Mid 152.  [name] ROTATOR CUFF REPAIR     bilateral  . Sleep Study  12/28/2006   AHI during total sleep time (3h 19 minutes) was 1.81/hr and during REM sleep at 17.78/hr. Mild sleep apnea during REM sleep.Oxygen staturated rate during REM and NREM was 93.0%.    OB History    No data available       Home Medications    Prior to Admission medications   Medication Sig Start Date End Date Taking? Authorizing Provider  aspirin 81 MG tablet Take 162 mg by mouth 2 (two) times daily.    Yes Historical Provider, MD  atorvastatin (LIPITOR) 10 MG tablet Take 1 tablet (10 mg total) by mouth at bedtime. 01/06/16  Yes Ripudeep Krystal Eaton, MD  clindamycin (CLEOCIN) 300 MG capsule Take 300 mg by mouth every 6 (six) hours. Started 09/22 for 7 days   Yes Historical Provider,  MD  diltiazem (CARDIZEM CD) 120 MG 24 hr capsule Take 1 capsule (120 mg total) by mouth daily. 01/06/16  Yes Ripudeep Krystal Eaton, MD  donepezil (ARICEPT) 10 MG tablet Take 1 tablet (10 mg total) by mouth daily. 07/13/14  Yes Marcial Pacas, MD  DULoxetine (CYMBALTA) 60 MG capsule Take 1 capsule by mouth daily. Reported on 08/05/2015 09/13/14  Yes Historical Provider, MD  etodolac (LODINE) 400 MG tablet Take 400 mg by mouth 2 (two) times daily. 02/07/16  Yes Historical Provider, MD  fluticasone (FLONASE) 50 MCG/ACT nasal spray USE 1 TO 2 SPRAYS IN EACH NOSTRIL ONCE DAILY FOR STUFFY NOSE OR DRAINAGE 07/18/15  Yes Jiles Prows, MD  folic acid (FOLVITE) 1 MG tablet Take 1 tablet (1 mg total) by mouth daily. 01/06/16  Yes Ripudeep Krystal Eaton, MD  furosemide (LASIX) 20 MG tablet Take 1 tablet (20 mg total) by mouth daily. 09/10/15  Yes [name], MD  lisinopril (PRINIVIL,ZESTRIL) 20 MG tablet Take 20 mg by mouth daily.   Yes Historical Provider, MD  metoprolol (LOPRESSOR) 50 MG tablet Take 1 tablet (50 mg total) by mouth 2 (two) times daily. 01/06/16  Yes Ripudeep Krystal Eaton, MD  mirtazapine (REMERON) 15 MG tablet Take 15 mg by mouth at bedtime. Reported on 08/05/2015   Yes Historical Provider, MD  mometasone-formoterol (DULERA) 100-5 MCG/ACT AERO Inhale 2 puffs into the lungs 2 (two) times daily.   Yes Historical Provider, MD  quinapril (ACCUPRIL) 20 MG tablet Take 1 tablet (20 mg total) by mouth daily. 01/06/16  Yes Ripudeep Krystal Eaton, MD  ranitidine (ZANTAC) 300 MG tablet Take 1 tablet (300 mg total) by mouth at bedtime. 07/21/15  Yes [name], MD  VENTOLIN HFA 108 (90 BASE) MCG/ACT inhaler Inhale 1 puff into the lungs every 12 (twelve) hours as needed for shortness of breath.  11/27/12  Yes Historical Provider, MD  omeprazole (PRILOSEC) 20 MG capsule Take 1 capsule (20 mg total) by mouth 2 (two) times daily before a meal. Patient not taking: Reported on 02/11/2016 08/05/15   Jiles Prows, MD  thiamine 100 MG tablet Take 1 tablet (100 mg total) by mouth daily. Patient not taking: Reported on 02/11/2016 01/06/16   Ripudeep Krystal Eaton, MD    Family History Family History  Problem Relation Age of Onset  . Heart disease Mother   . Hypertension Sister     Social History Social History  Substance Use Topics  . Smoking status: Former Smoker    Quit date: 11/08/1962  . Smokeless tobacco: Never Used  . Alcohol use 0.0 oz/week     Comment: drinks wine/beer daily     Allergies   Penicillins   Review of Systems Review of Systems  Constitutional: Negative for appetite change and fever.  HENT: Negative for congestion.     Eyes: Negative for photophobia.  Respiratory: Positive for shortness of breath.   Cardiovascular: Negative for chest pain.  Gastrointestinal: Negative for abdominal pain.  Genitourinary: Negative for flank pain.  Musculoskeletal: Negative for back pain.  Neurological: Positive for light-headedness.  Hematological: Negative for adenopathy.  Psychiatric/Behavioral: Negative for behavioral problems.     Physical Exam Updated Vital Signs BP (!) 136/43 (BP Location: Right Arm)   Pulse (!) 54   Temp 97.7 F (36.5 C) (Oral)   Resp 16   SpO2 95%   Physical Exam  Constitutional: She appears well-developed and well-nourished.  HENT:  Head: Atraumatic.  Eyes: EOM are normal.  Cardiovascular:  Bradycardia  Pulmonary/Chest: Effort normal.  Abdominal: Soft. There is no tenderness.  Musculoskeletal: She exhibits no edema.  Neurological: She is alert.  No nystagmus. Finger-nose intact bilaterally. No dizziness with rotation of her head.  Skin: Skin is warm. Capillary refill takes less than 2 seconds.  Psychiatric: She has a normal mood and affect.     ED Treatments / Results  Labs (all labs ordered are listed, but only abnormal results are displayed) Labs Reviewed  BASIC METABOLIC PANEL - Abnormal; Notable for the following:       Result Value   Sodium 134 (*)    CO2 20 (*)    Glucose, Bld 142 (*)    BUN 38 (*)    Creatinine, Ser 1.74 (*)    GFR calc non Af Amer 26 (*)    GFR calc Af Amer 30 (*)    All other components within normal limits  CBC - Abnormal; Notable for the following:    WBC 17.3 (*)    All other components within normal limits  CBG MONITORING, ED - Abnormal; Notable for the following:    Glucose-Capillary 147 (*)    All other components within normal limits  URINALYSIS, ROUTINE W REFLEX MICROSCOPIC (NOT AT [facility])  Randolm Idol, ED    EKG  EKG Interpretation None       Radiology Dg Chest 2 View  Result Date: 02/11/2016 CLINICAL DATA:   Lightheadedness. EXAM: CHEST  2 VIEW COMPARISON:  01/01/2016 FINDINGS: Previous median sternotomy and CABG procedure.Mild cardiac enlargement. No pleural effusion or edema. Lungs are clear. Spondylosis identified within the thoracic spine. IMPRESSION: 1. No acute findings. 2. Cardiac enlargement. Electronically Signed   By: Kerby Moors M.D.   On: 02/11/2016 23:17    Procedures Procedures (including critical care time)  Medications Ordered in ED Medications  sodium chloride 0.9 % bolus 500 mL (500 mLs Intravenous New Bag/Given 02/12/16 0000)     Initial Impression / Assessment and Plan / ED Course  I have reviewed the triage vital signs and the nursing notes.  Pertinent labs & imaging results that were available during my care of the patient were reviewed by me and considered in my medical decision making (see chart for details).  Clinical Course    Patient with lightheadedness. Appears be bradycardia. Recently started on Cardizem for A. Fib with RVR that could be contributing to this. Also has some apparent renal insufficiency which could be from dehydration. Feels fine unless she gets up to walk around. Will admit to internal medicine and likely only decrease in her medications.  Final Clinical Impressions(s) / ED Diagnoses   Final diagnoses:  Leukocytosis  SOB (shortness of breath)  Symptomatic bradycardia  Renal insufficiency    New Prescriptions Current Discharge Medication List       Davonna Belling, MD 02/12/16 0020

## 2016-02-11 NOTE — ED Notes (Signed)
Pt. CBG 147. RN,Cortney made aware.

## 2016-02-11 NOTE — ED Triage Notes (Signed)
PT states that [facility] following a fall and new onset A fib. According to pt, A fib has resolved- pt is sinus brady at this time. Pt is complaining of new onset dizziness. Pt also complaining of SOB, but states that she thinks that that is r/t her asthma. Denies chest pain. Endorses a headache this morning that resolved with 3 tylenol. Ambulatory at home with a walker. A&Ox4.

## 2016-02-12 ENCOUNTER — Encounter (HOSPITAL_COMMUNITY): Payer: Self-pay | Admitting: Family Medicine

## 2016-02-12 DIAGNOSIS — R001 Bradycardia, unspecified: Secondary | ICD-10-CM

## 2016-02-12 DIAGNOSIS — F329 Major depressive disorder, single episode, unspecified: Secondary | ICD-10-CM | POA: Diagnosis not present

## 2016-02-12 DIAGNOSIS — N179 Acute kidney failure, unspecified: Secondary | ICD-10-CM | POA: Diagnosis not present

## 2016-02-12 DIAGNOSIS — F32A Depression, unspecified: Secondary | ICD-10-CM | POA: Diagnosis present

## 2016-02-12 DIAGNOSIS — R413 Other amnesia: Secondary | ICD-10-CM

## 2016-02-12 DIAGNOSIS — I480 Paroxysmal atrial fibrillation: Secondary | ICD-10-CM

## 2016-02-12 DIAGNOSIS — N289 Disorder of kidney and ureter, unspecified: Secondary | ICD-10-CM

## 2016-02-12 DIAGNOSIS — I10 Essential (primary) hypertension: Secondary | ICD-10-CM

## 2016-02-12 DIAGNOSIS — R0602 Shortness of breath: Secondary | ICD-10-CM

## 2016-02-12 DIAGNOSIS — I25119 Atherosclerotic heart disease of native coronary artery with unspecified angina pectoris: Secondary | ICD-10-CM

## 2016-02-12 DIAGNOSIS — D72829 Elevated white blood cell count, unspecified: Secondary | ICD-10-CM | POA: Diagnosis present

## 2016-02-12 LAB — BASIC METABOLIC PANEL
Anion gap: 7 (ref 5–15)
BUN: 25 mg/dL — ABNORMAL HIGH (ref 6–20)
CO2: 21 mmol/L — ABNORMAL LOW (ref 22–32)
Calcium: 8.7 mg/dL — ABNORMAL LOW (ref 8.9–10.3)
Chloride: 110 mmol/L (ref 101–111)
Creatinine, Ser: 1.11 mg/dL — ABNORMAL HIGH (ref 0.44–1.00)
GFR calc Af Amer: 52 mL/min — ABNORMAL LOW (ref >60)
GFR calc non Af Amer: 45 mL/min — ABNORMAL LOW (ref >60)
Glucose, Bld: 114 mg/dL — ABNORMAL HIGH (ref 65–99)
Potassium: 3.8 mmol/L (ref 3.5–5.1)
Sodium: 138 mmol/L (ref 135–145)

## 2016-02-12 LAB — GLUCOSE, CAPILLARY: Glucose-Capillary: 120 mg/dL — ABNORMAL HIGH (ref 65–99)

## 2016-02-12 LAB — TROPONIN I
Troponin I: <0.03 ng/mL (ref <0.03)
Troponin I: <0.03 ng/mL (ref <0.03)

## 2016-02-12 LAB — CREATININE, URINE, RANDOM: Creatinine, Urine: 59.25 mg/dL

## 2016-02-12 LAB — SODIUM, URINE, RANDOM: Sodium, Ur: 63 mmol/L

## 2016-02-12 LAB — BRAIN NATRIURETIC PEPTIDE: B Natriuretic Peptide: 651 pg/mL — ABNORMAL HIGH (ref 0.0–100.0)

## 2016-02-12 LAB — TSH: TSH: 0.808 u[IU]/mL (ref 0.350–4.500)

## 2016-02-12 MED ORDER — MOMETASONE FURO-FORMOTEROL FUM 100-5 MCG/ACT IN AERO
2.0000 | INHALATION_SPRAY | Freq: Two times a day (BID) | RESPIRATORY_TRACT | Status: DC
  Administered 2016-02-12 – 2016-02-13 (×3): 2 via RESPIRATORY_TRACT
  Filled 2016-02-12: qty 8.8

## 2016-02-12 MED ORDER — SODIUM CHLORIDE 0.9 % IV SOLN
INTRAVENOUS | Status: DC
  Administered 2016-02-12: 02:00:00 via INTRAVENOUS

## 2016-02-12 MED ORDER — HEPARIN SODIUM (PORCINE) 5000 UNIT/ML IJ SOLN
5000.0000 [IU] | Freq: Three times a day (TID) | INTRAMUSCULAR | Status: DC
  Administered 2016-02-12 – 2016-02-13 (×4): 5000 [IU] via SUBCUTANEOUS
  Filled 2016-02-12 (×4): qty 1

## 2016-02-12 MED ORDER — HYDROCODONE-ACETAMINOPHEN 5-325 MG PO TABS: 1.0000 | ORAL_TABLET | ORAL | Status: DC | PRN

## 2016-02-12 MED ORDER — DILTIAZEM HCL 30 MG PO TABS
30.0000 mg | ORAL_TABLET | Freq: Two times a day (BID) | ORAL | Status: DC
  Administered 2016-02-12 (×2): 30 mg via ORAL
  Filled 2016-02-12 (×2): qty 1

## 2016-02-12 MED ORDER — ONDANSETRON HCL 4 MG/2ML IJ SOLN: 4.0000 mg | Freq: Four times a day (QID) | INTRAMUSCULAR | Status: DC | PRN

## 2016-02-12 MED ORDER — ALBUTEROL SULFATE HFA 108 (90 BASE) MCG/ACT IN AERS: 1.0000 | INHALATION_SPRAY | Freq: Two times a day (BID) | RESPIRATORY_TRACT | Status: DC | PRN

## 2016-02-12 MED ORDER — ALBUTEROL SULFATE (2.5 MG/3ML) 0.083% IN NEBU: 2.5000 mg | INHALATION_SOLUTION | Freq: Four times a day (QID) | RESPIRATORY_TRACT | Status: DC | PRN

## 2016-02-12 MED ORDER — POLYETHYLENE GLYCOL 3350 17 G PO PACK: 17.0000 g | PACK | Freq: Every day | ORAL | Status: DC | PRN

## 2016-02-12 MED ORDER — DULOXETINE HCL 60 MG PO CPEP
60.0000 mg | ORAL_CAPSULE | Freq: Every day | ORAL | Status: DC
  Administered 2016-02-12 – 2016-02-13 (×2): 60 mg via ORAL
  Filled 2016-02-12 (×3): qty 1

## 2016-02-12 MED ORDER — ACETAMINOPHEN 650 MG RE SUPP: 650.0000 mg | Freq: Four times a day (QID) | RECTAL | Status: DC | PRN

## 2016-02-12 MED ORDER — DONEPEZIL HCL 5 MG PO TABS
10.0000 mg | ORAL_TABLET | Freq: Every day | ORAL | Status: DC
  Administered 2016-02-12 – 2016-02-13 (×2): 10 mg via ORAL
  Filled 2016-02-12 (×3): qty 2

## 2016-02-12 MED ORDER — SODIUM CHLORIDE 0.9% FLUSH
3.0000 mL | Freq: Two times a day (BID) | INTRAVENOUS | Status: DC
  Administered 2016-02-12 – 2016-02-13 (×3): 3 mL via INTRAVENOUS

## 2016-02-12 MED ORDER — MIRTAZAPINE 15 MG PO TABS
15.0000 mg | ORAL_TABLET | Freq: Every day | ORAL | Status: DC
  Administered 2016-02-12 (×2): 15 mg via ORAL
  Filled 2016-02-12 (×2): qty 1

## 2016-02-12 MED ORDER — FOLIC ACID 1 MG PO TABS
1.0000 mg | ORAL_TABLET | Freq: Every day | ORAL | Status: DC
  Administered 2016-02-12 – 2016-02-13 (×2): 1 mg via ORAL
  Filled 2016-02-12 (×2): qty 1

## 2016-02-12 MED ORDER — ONDANSETRON HCL 4 MG PO TABS: 4.0000 mg | ORAL_TABLET | Freq: Four times a day (QID) | ORAL | Status: DC | PRN

## 2016-02-12 MED ORDER — FAMOTIDINE 20 MG PO TABS
20.0000 mg | ORAL_TABLET | Freq: Two times a day (BID) | ORAL | Status: DC
  Administered 2016-02-12 – 2016-02-13 (×4): 20 mg via ORAL
  Filled 2016-02-12 (×4): qty 1

## 2016-02-12 MED ORDER — ASPIRIN EC 81 MG PO TBEC
162.0000 mg | DELAYED_RELEASE_TABLET | Freq: Two times a day (BID) | ORAL | Status: DC
  Administered 2016-02-12 – 2016-02-13 (×4): 162 mg via ORAL
  Filled 2016-02-12 (×4): qty 2

## 2016-02-12 MED ORDER — ACETAMINOPHEN 325 MG PO TABS
650.0000 mg | ORAL_TABLET | Freq: Four times a day (QID) | ORAL | Status: DC | PRN
  Administered 2016-02-12: 650 mg via ORAL
  Filled 2016-02-12: qty 2

## 2016-02-12 MED ORDER — ATORVASTATIN CALCIUM 10 MG PO TABS
10.0000 mg | ORAL_TABLET | Freq: Every day | ORAL | Status: DC
  Administered 2016-02-12 (×2): 10 mg via ORAL
  Filled 2016-02-12 (×2): qty 1

## 2016-02-12 MED ORDER — DILTIAZEM HCL ER COATED BEADS 120 MG PO CP24
120.0000 mg | ORAL_CAPSULE | Freq: Every day | ORAL | Status: DC
Start: 2016-02-12 — End: 2016-02-12

## 2016-02-12 NOTE — Progress Notes (Signed)
Patient frequently jumping out of beds, even when asked to stay in bed until staff is able to help her. Patient states " I live at home alone, I walk around and do for myself all the time." RN explained to patient that while I understand she does here we want to keep her safe so she does not fall. Patient understands but wants to be able to "walk around freely."  RN educated on bed alarm and safety purpose, patient still refusing alarm. Will attempt to set alarm at a later time.

## 2016-02-12 NOTE — Evaluation (Signed)
Physical Therapy Evaluation Patient Details Name: Tammy [name] MRN: [id number] DOB: 25-Sep-1930 Today's Date: 02/12/2016   History of Present Illness  82 yo female admitted with bradycardia. Hx of CAD, HTN, CHF, A fib, falls. Recent d/c from SNF rehab.   Clinical Impression  On eval, pt was Min guard assist for mobility. She walked ~250 feet with a 4 wheeled walker. Dyspnea 2/4 with ambulation. Noted some unsteadiness without use of walker. Recommend HHPT follow up after discharge.     Follow Up Recommendations Home health PT;Supervision - Intermittent    Equipment Recommendations  None recommended by PT    Recommendations for Other Services       Precautions / Restrictions Precautions Precautions: Fall Restrictions Weight Bearing Restrictions: No      Mobility  Bed Mobility Overal bed mobility: Independent                Transfers Overall transfer level: Modified independent                  Ambulation/Gait Ambulation/Gait assistance: Min guard Ambulation Distance (Feet): 250 Feet Assistive device: 4-wheeled walker Gait Pattern/deviations: Step-through pattern;Decreased stride length     General Gait Details: close guard for safety. No LOB with use of 4 wheeled walker. Dyspnea 2/4. HR upper 80s with ambulation  Stairs            Wheelchair Mobility    Modified Rankin (Stroke Patients Only)       Balance Overall balance assessment: History of Falls;Needs assistance           Standing balance-Leahy Scale: Fair                               Pertinent Vitals/Pain Pain Assessment: No/denies pain    Home Living Family/patient expects to be discharged to:: Private residence Living Arrangements: Alone Available Help at Discharge: Family Type of Home: House Home Access: Stairs to enter Entrance Stairs-Rails: Psychiatric nurse of Steps: 3-4   Home Equipment: Environmental consultant - 4 wheels      Prior Function Level  of Independence: Independent with assistive device(s)               Hand Dominance        Extremity/Trunk Assessment   Upper Extremity Assessment: Overall WFL for tasks assessed           Lower Extremity Assessment: Overall WFL for tasks assessed      Cervical / Trunk Assessment: Normal  Communication   Communication: HOH  Cognition Arousal/Alertness: Awake/alert Behavior During Therapy: WFL for tasks assessed/performed Overall Cognitive Status: Within Functional Limits for tasks assessed                      General Comments      Exercises     Assessment/Plan    PT Assessment Patient needs continued PT services  PT Problem List Decreased strength;Decreased mobility;Decreased balance;Decreased activity tolerance          PT Treatment Interventions DME instruction;Gait training;Therapeutic activities;Therapeutic exercise;Functional mobility training;Patient/family education    PT Goals (Current goals can be found in the Care Plan section)  Acute Rehab PT Goals Patient Stated Goal: home tomorrow PT Goal Formulation: With patient Time For Goal Achievement: 02/19/16 Potential to Achieve Goals: Good    Frequency Min 3X/week   Barriers to discharge        Co-evaluation  End of Session Equipment Utilized During Treatment: Gait belt Activity Tolerance: Patient tolerated treatment well Patient left: in chair;with call bell/phone within reach      Functional Assessment Tool Used: clinical judgement Functional Limitation: Mobility: Walking and moving around Mobility: Walking and Moving Around Current Status [id number]): At least 1 percent but less than 20 percent impaired, limited or restricted Mobility: Walking and Moving Around Goal Status [phone]): At least 1 percent but less than 20 percent impaired, limited or restricted    Time: 1334-1351 PT Time Calculation (min) (ACUTE ONLY): 17 min   Charges:   PT Evaluation $PT  Eval Low Complexity: 1 Procedure     PT G Codes:   PT G-Codes **NOT FOR INPATIENT CLASS** Functional Assessment Tool Used: clinical judgement Functional Limitation: Mobility: Walking and moving around Mobility: Walking and Moving Around Current Status [id number]): At least 1 percent but less than 20 percent impaired, limited or restricted Mobility: Walking and Moving Around Goal Status [phone]): At least 1 percent but less than 20 percent impaired, limited or restricted    Weston Anna, MPT Pager: [phone]

## 2016-02-12 NOTE — Care Management Obs Status (Signed)
[facility] NOTIFICATION   Patient Details  Name: Tammy [name] MRN: [id number] Date of Birth: 01-30-1931   Medicare Observation Status Notification Given:  Yes    Purcell Mouton, RN 02/12/2016, 1:05 PM

## 2016-02-12 NOTE — H&P (Signed)
History and Physical    Tammy [name] [id number] DOB: 1930-08-28 DOA: 02/11/2016  PCP:  Melinda Crutch, MD   Patient coming from: Home  Chief Complaint: Lightheadedness, dyspnea   HPI: Tammy [name] is a 82 y.o. female with medical history significant for coronary artery disease, hypertension, depression, chronic diastolic CHF, and paroxysmal atrial fibrillation who presents the emergency department for evaluation of lightheadedness and mild dyspnea. Patient had been admitted to the hospital in August after a fall in which she was down on the ground for 2 days and was in new-onset A. fib with RVR when she was discovered. She was continued on metoprolol and discharged with diltiazem after that admission and had been in a skilled nursing facility for acute rehabilitation until returning home in the past couple days. Since her return home, she has been lightheaded and dyspneic with exertion. She denies syncope, change in vision or hearing, or focal numbness or weakness. Patient also denies chest pain or palpitations. There has been no fever, chills, or cough. She reports that she was recently treated for a right lower extremity cellulitis with clindamycin, but reports resolution of that.  ED Course: Upon arrival to the ED, patient is found to be afebrile, saturating well on room air, bradycardic to 46, and with stable blood pressure. EKG demonstrates ectopic atrial bradycardia with rate 44 and minimal ST depression in the lateral leads. Chest x-ray is negative for acute cardiopulmonary disease. Chemistry panel features a mild hyponatremia, BUN 38, and serum creatinine 1.74, up from 1.02 at time of recent discharge at the end of August. CBC features a leukocytosis to 17,300. Troponin is undetectable and her urinalysis was unremarkable. Patient was given a 500 mL normal saline bolus in the emergency department. Patient remains bradycardic, though it is improved. Blood pressure is remained stable and she is in  no acute respiratory distress. She'll be observed on the telemetry unit for ongoing evaluation of lightheadedness suspected secondary to symptomatic bradycardia.  Review of Systems:  All other systems reviewed and apart from HPI, are negative.  Past Medical History:  Diagnosis Date  . Acid reflux   . Asthma    Asthmatic COPD  . Atrial fibrillation ([place]) 01/01/2016  . Coronary artery disease    Echo 07/27/2011   . Coronary artery stenosis    , High-grade left main  . GERD (gastroesophageal reflux disease)   . Hyperlipidemia   . Hypertension   . Psoriasis     Past Surgical History:  Procedure Laterality Date  . CARDIAC SURGERY    . CORONARY ARTERY BYPASS GRAFT  1994   After catheterization showed 90% ostial left main stenosis  . DOPPLER ECHOCARDIOGRAPHY     Study showed mild to moderate MR with moderate TR, mild to moderate pulmonary hypertension with an ejection fraction greater that 55%.   [name] EYE SURGERY    . Nuclear Perfusion  10/2010, 11/15/2012   Showed normal perfusion, No Ischemia or Infarction, Normal EF  . Renal Scan Duplex  04/09/2012   Showing greater than 50% diameter reduction in the celiac artery and SMA.. Right proximal 275 and Mid 152.  [name] ROTATOR CUFF REPAIR     bilateral  . Sleep Study  12/28/2006   AHI during total sleep time (3h 19 minutes) was 1.81/hr and during REM sleep at 17.78/hr. Mild sleep apnea during REM sleep.Oxygen staturated rate during REM and NREM was 93.0%.     reports that she quit smoking about 53 years ago. She has never  used smokeless tobacco. She reports that she drinks alcohol. She reports that she does not use drugs.  Allergies  Allergen Reactions  . Penicillins Hives    Has patient had a PCN reaction causing immediate rash, facial/tongue/throat swelling, SOB or lightheadedness with hypotension: No Has patient had a PCN reaction causing severe rash involving mucus membranes or skin necrosis: Yes Has patient had a PCN reaction that  required hospitalization No Has patient had a PCN reaction occurring within the last 10 years: Yes took again last year 2016 If all of the above answers are "NO", then may proceed with Cephalosporin use.     Family History  Problem Relation Age of Onset  . Heart disease Mother   . Hypertension Sister      Prior to Admission medications   Medication Sig Start Date End Date Taking? Authorizing Provider  aspirin 81 MG tablet Take 162 mg by mouth 2 (two) times daily.    Yes Historical Provider, MD  atorvastatin (LIPITOR) 10 MG tablet Take 1 tablet (10 mg total) by mouth at bedtime. 01/06/16  Yes Ripudeep Krystal Eaton, MD  clindamycin (CLEOCIN) 300 MG capsule Take 300 mg by mouth every 6 (six) hours. Started 09/22 for 7 days   Yes Historical Provider, MD  diltiazem (CARDIZEM CD) 120 MG 24 hr capsule Take 1 capsule (120 mg total) by mouth daily. 01/06/16  Yes Ripudeep Krystal Eaton, MD  donepezil (ARICEPT) 10 MG tablet Take 1 tablet (10 mg total) by mouth daily. 07/13/14  Yes Marcial Pacas, MD  DULoxetine (CYMBALTA) 60 MG capsule Take 1 capsule by mouth daily. Reported on 08/05/2015 09/13/14  Yes Historical Provider, MD  etodolac (LODINE) 400 MG tablet Take 400 mg by mouth 2 (two) times daily. 02/07/16  Yes Historical Provider, MD  fluticasone (FLONASE) 50 MCG/ACT nasal spray USE 1 TO 2 SPRAYS IN EACH NOSTRIL ONCE DAILY FOR STUFFY NOSE OR DRAINAGE 07/18/15  Yes Jiles Prows, MD  folic acid (FOLVITE) 1 MG tablet Take 1 tablet (1 mg total) by mouth daily. 01/06/16  Yes Ripudeep Krystal Eaton, MD  furosemide (LASIX) 20 MG tablet Take 1 tablet (20 mg total) by mouth daily. 09/10/15  Yes [name], MD  lisinopril (PRINIVIL,ZESTRIL) 20 MG tablet Take 20 mg by mouth daily.   Yes Historical Provider, MD  metoprolol (LOPRESSOR) 50 MG tablet Take 1 tablet (50 mg total) by mouth 2 (two) times daily. 01/06/16  Yes Ripudeep Krystal Eaton, MD  mirtazapine (REMERON) 15 MG tablet Take 15 mg by mouth at bedtime. Reported on 08/05/2015   Yes Historical  Provider, MD  mometasone-formoterol (DULERA) 100-5 MCG/ACT AERO Inhale 2 puffs into the lungs 2 (two) times daily.   Yes Historical Provider, MD  quinapril (ACCUPRIL) 20 MG tablet Take 1 tablet (20 mg total) by mouth daily. 01/06/16  Yes Ripudeep Krystal Eaton, MD  ranitidine (ZANTAC) 300 MG tablet Take 1 tablet (300 mg total) by mouth at bedtime. 07/21/15  Yes [name], MD  VENTOLIN HFA 108 (90 BASE) MCG/ACT inhaler Inhale 1 puff into the lungs every 12 (twelve) hours as needed for shortness of breath.  11/27/12  Yes Historical Provider, MD  omeprazole (PRILOSEC) 20 MG capsule Take 1 capsule (20 mg total) by mouth 2 (two) times daily before a meal. Patient not taking: Reported on 02/11/2016 08/05/15   Jiles Prows, MD  thiamine 100 MG tablet Take 1 tablet (100 mg total) by mouth daily. Patient not taking: Reported on 02/11/2016 01/06/16  Mendel Corning, MD    Physical Exam: Vitals:   02/11/16 2012 02/11/16 2352 02/12/16 0019  BP: 102/55 (!) 136/43 (!) 153/44  Pulse: (!) 46 (!) 54 (!) 59  Resp: 24 16   Temp: 97.5 F (36.4 C) 97.7 F (36.5 C) 97.6 F (36.4 C)  TempSrc: Oral Oral Oral  SpO2: 97% 95% 98%  Weight:   59.1 kg (130 lb 4.7 oz)  Height:   5' (1.524 m)      Constitutional: NAD, calm, comfortable Eyes: PERTLA, lids and conjunctivae normal ENMT: Mucous membranes are moist. Posterior pharynx clear of any exudate or lesions.   Neck: normal, supple, no masses, no thyromegaly Respiratory: clear to auscultation bilaterally, no wheezing, no crackles. Normal respiratory effort.     Cardiovascular: Rate ~45 and regular with no appreciable murmur. No carotid bruits. No significant JVD. Abdomen: No distension, no tenderness, no masses palpated. Bowel sounds normal.  Musculoskeletal: no clubbing / cyanosis. No joint deformity upper and lower extremities. Normal muscle tone.  Skin: no significant rashes, lesions, ulcers. Warm, dry, well-perfused. Neurologic: CN 2-12 grossly intact. Sensation  intact, DTR normal. Strength 5/5 in all 4 limbs.  Psychiatric: Normal judgment and insight. Alert and oriented x 3. Normal mood and affect.     Labs on Admission: I have personally reviewed following labs and imaging studies  CBC:  Recent Labs Lab 02/11/16 2106  WBC 17.3*  HGB 12.1  HCT 37.3  MCV 86.9  PLT [id number]   Basic Metabolic Panel:  Recent Labs Lab 02/11/16 2106  NA 134*  K 3.9  CL 104  CO2 20*  GLUCOSE 142*  BUN 38*  CREATININE 1.74*  CALCIUM 9.0   GFR: Estimated Creatinine Clearance: 19.7 mL/min (by C-G formula based on SCr of 1.74 mg/dL (H)). Liver Function Tests: No results for input(s): AST, ALT, ALKPHOS, BILITOT, PROT, ALBUMIN in the last 168 hours. No results for input(s): LIPASE, AMYLASE in the last 168 hours. No results for input(s): AMMONIA in the last 168 hours. Coagulation Profile: No results for input(s): INR, PROTIME in the last 168 hours. Cardiac Enzymes: No results for input(s): CKTOTAL, CKMB, CKMBINDEX, TROPONINI in the last 168 hours. BNP (last 3 results) No results for input(s): PROBNP in the last 8760 hours. HbA1C: No results for input(s): HGBA1C in the last 72 hours. CBG:  Recent Labs Lab 02/11/16 2036  GLUCAP 147*   Lipid Profile: No results for input(s): CHOL, HDL, LDLCALC, TRIG, CHOLHDL, LDLDIRECT in the last 72 hours. Thyroid Function Tests: No results for input(s): TSH, T4TOTAL, FREET4, T3FREE, THYROIDAB in the last 72 hours. Anemia Panel: No results for input(s): VITAMINB12, FOLATE, FERRITIN, TIBC, IRON, RETICCTPCT in the last 72 hours. Urine analysis:    Component Value Date/Time   COLORURINE YELLOW 02/11/2016 2227   APPEARANCEUR CLEAR 02/11/2016 2227   LABSPEC 1.015 02/11/2016 2227   PHURINE 5.5 02/11/2016 2227   GLUCOSEU NEGATIVE 02/11/2016 2227   HGBUR NEGATIVE 02/11/2016 2227   BILIRUBINUR NEGATIVE 02/11/2016 2227   KETONESUR NEGATIVE 02/11/2016 2227   PROTEINUR NEGATIVE 02/11/2016 2227   UROBILINOGEN 0.2  07/11/2009 2220   NITRITE NEGATIVE 02/11/2016 2227   LEUKOCYTESUR NEGATIVE 02/11/2016 2227   Sepsis Labs: @LABRCNTIP (procalcitonin:4,lacticidven:4) )No results found for this or any previous visit (from the past 240 hour(s)).   Radiological Exams on Admission: Dg Chest 2 View  Result Date: 02/11/2016 CLINICAL DATA:  Lightheadedness. EXAM: CHEST  2 VIEW COMPARISON:  01/01/2016 FINDINGS: Previous median sternotomy and CABG procedure.Mild cardiac enlargement. No pleural effusion  or edema. Lungs are clear. Spondylosis identified within the thoracic spine. IMPRESSION: 1. No acute findings. 2. Cardiac enlargement. Electronically Signed   By: Kerby Moors M.D.   On: 02/11/2016 23:17    EKG: Independently reviewed. Bradycardia (rate 44), appears to be sinus, minimal ST-depression lateral leads.  Assessment/Plan  1. Lightheadedness, SOB  - Suspected secondary to bradycardia with rate in mid-low 40's on initial presentation  - CXR is clear, pt oxygenating well and in no respiratory distress; troponin undetectable  - Monitoring on telemetry while addressing bradycardia as below    2. Bradycardia  - Rate in mid 40s on arrival; appears to be a sinus rhythm on EKG - Likely secondary to the AV-nodal blocking agents, though will cycle troponin to exclude MI  - Hold metoprolol and diltiazem for now, consider resuming at decreased dose as bradycardia resolves  - Monitor on telemetry   3. Paroxysmal atrial fibrillation  - Appears to be in sinus bradycardia on admission  - Recent TTE without significant valvular disease and only mild left atrial enlargement  - CHADS-VASc at least 24 (age x2, gender, HTN, CAD, CHF) - She is followed by cardiology and not on anticoagulation due to the ongoing fall risk  - Monitor on telemetry  - Metoprolol and diltiazem held at time of admission in the setting of symptomatic bradycardia  4. AKI  - SCr 1.74 on admission, up from 1.02 one month prior  - Uncertain  etiology, will check urine studies  - Avoid nephrotoxins, hold ACE-i (was recently discharged to continue quinapril, but somehow also had lisinopril on med list at admission), Lasix, etodolac   - Given 500 cc NS in ED, continue IVF hydration with NS at 75 cc/hr overnight  - Repeat chem panel in am   5. CAD - Hx of CABG in 1994; normal nuc med stress test in July 2016  - No angina on admission, troponin 0.00  - Continue Lipitor and daily ASA - Hold ACE in light of AKI and metoprolol in setting of symptomatic bradycardia; resume as appropriate   6. HTN - BP on the low side on presentation  - ACE, metoprolol, and diltiazem all held on admission as above   7. Memory loss  - Stable, continue Aricept    8. Depression  - Stable, not currently depressed  - Continue Cymbalta, Remeron   DVT prophylaxis: sq heparin Code Status: Full  Family Communication: Daughter updated at bedside Disposition Plan: Observe on telemetry Consults called: None Admission status: Observation    Vianne Bulls, MD [facility] Pager [phone]  If 7PM-7AM, please contact night-coverage [email or web address] Password [facility]  02/12/2016, 12:48 AM

## 2016-02-12 NOTE — Progress Notes (Addendum)
Patient ID: Tammy [name], female   DOB: 30-Mar-1931, 82 y.o.   MRN: [id number]  Pt admitted after midnight. For details, please refer to admission note done 02/12/2016.  82 y.o. female with past medical history significant for coronary artery disease, hypertension, depression, chronic diastolic CHF, paroxysmal atrial fibrillation who present to [facility] ED for evaluation of lightheadedness and dyspnea on exertion. Patient reports no syncope. No weakness. On admission, patient was hemodynamically stable. 12-lead EKG showed bradycardia. Chest x-ray showed no acute cardiopulmonary disease.  Assessment and Plan:  Atrial fibrillation / Bradycardia  - Not on Cardizem drip - Started Cardizem 30 mg Q 12 hours - Continue to monitor on telemetry - Possible discharge home in am - Resume [facility] services   Leisa Lenz [facility] [id number]

## 2016-02-13 DIAGNOSIS — I482 Chronic atrial fibrillation: Secondary | ICD-10-CM

## 2016-02-13 DIAGNOSIS — I10 Essential (primary) hypertension: Secondary | ICD-10-CM | POA: Diagnosis not present

## 2016-02-13 DIAGNOSIS — R001 Bradycardia, unspecified: Secondary | ICD-10-CM | POA: Diagnosis not present

## 2016-02-13 LAB — UREA NITROGEN, URINE: Urea Nitrogen, Ur: 536 mg/dL

## 2016-02-13 MED ORDER — DILTIAZEM HCL ER COATED BEADS 120 MG PO CP24
120.0000 mg | ORAL_CAPSULE | Freq: Every day | ORAL | Status: DC
  Administered 2016-02-13: 120 mg via ORAL
  Filled 2016-02-13: qty 1

## 2016-02-13 NOTE — Discharge Summary (Addendum)
Physician Discharge Summary  Tammy [name] [id number] DOB: 1930-06-25 DOA: 02/11/2016  PCP:  Melinda Crutch, MD  Admit date: 02/11/2016 Discharge date: 02/13/2016  Recommendations for Outpatient Follow-up:  1. We stopped metoprolol on discharge. Pt may resume all other medications as per prior to the admission   Discharge Diagnoses:  Principal Problem:   Symptomatic bradycardia Active Problems:   Coronary artery disease involving native coronary artery of native heart with angina pectoris (HCC)   Essential hypertension   Memory loss   PAF (paroxysmal atrial fibrillation) (HCC)   AKI (acute kidney injury) (HCC)   Bradycardia on ECG   Leukocytosis   Depression   Renal insufficiency   SOB (shortness of breath)    Discharge Condition: stable; pt insists on going home today. Pt apparently more belligerent overnight but this morning she is calm, cooperative, she know her name, date, where she is and her home address.   Diet recommendation: as tolerated   History of present illness:  82 y.o.femalewith past medical history significant for coronary artery disease, hypertension, depression, chronic diastolic CHF, paroxysmal atrial fibrillation who present to [facility] ED for evaluation of lightheadedness and dyspnea on exertion. Patient reports no syncope. No weakness. On admission, patient was hemodynamically stable. 12-lead EKG showed bradycardia. Chest x-ray showed no acute cardiopulmonary disease.  Hospital Course:   Assessment and Plan:  Atrial fibrillation / Bradycardia  - CHADS vasc score 5 - Started Cardizem 30 mg Q 12 hours - BP this am 171/77 and HR 108; will resume Cardizem 120 mg daily - May continue to hold metoprolol due to risk of bradycardia  - On anticoagulation with aspirin  - Continue to monitor on telemetry - Possible discharge home in am - Resume HH services   Leukocytosis - Likely reactive - No evidence of acute infectious process   Essential hypertension -  Resume Cardizem, lisinopril, lasix - Continue to hold metoprolol due to risk of bradycardia    Signed:  Leisa Lenz, MD  [facility] 02/13/2016, 12:08 PM  Pager #: [phone]  Time spent in minutes: less than 30 minutes  Procedures:  None   Consultations:  PT - HH orders placed   Discharge Exam: Vitals:   02/12/16 2054 02/13/16 0609  BP: (!) 175/58 (!) 181/77  Pulse: 86 (!) 108  Resp: (!) 22 20  Temp: 97.7 F (36.5 C)    Vitals:   02/12/16 2034 02/12/16 2054 02/13/16 0609 02/13/16 0617  BP:  (!) 175/58 (!) 181/77   Pulse:  86 (!) 108   Resp:  (!) 22 20   Temp:  97.7 F (36.5 C)    TempSrc:  Oral    SpO2: 98% 96% 97%   Weight:    58.3 kg (128 lb 9.6 oz)  Height:        General: Pt is alert, follows commands appropriately, not in acute distress Cardiovascular: Regular rate and rhythm, S1/S2 +, no murmurs Respiratory: Clear to auscultation bilaterally, no wheezing, no crackles, no rhonchi Abdominal: Soft, non tender, non distended, bowel sounds +, no guarding Extremities: no edema, no cyanosis, pulses palpable bilaterally DP and PT Neuro: Grossly nonfocal  Discharge Instructions  Discharge Instructions    Call MD for:  persistant nausea and vomiting    Complete by:  As directed    Call MD for:  redness, tenderness, or signs of infection (pain, swelling, redness, odor or green/yellow discharge around incision site)    Complete by:  As directed    Call MD for:  severe uncontrolled pain    Complete by:  As directed    Diet - low sodium heart healthy    Complete by:  As directed    Increase activity slowly    Complete by:  As directed        Medication List    STOP taking these medications   clindamycin 300 MG capsule Commonly known as:  CLEOCIN   metoprolol 50 MG tablet Commonly known as:  LOPRESSOR     TAKE these medications   acetaminophen 500 MG tablet Commonly known as:  TYLENOL Take 1,000 mg by mouth every 8 (eight) hours as  needed for moderate pain.   aspirin 81 MG tablet Take 81 mg by mouth daily.   atorvastatin 10 MG tablet Commonly known as:  LIPITOR Take 1 tablet (10 mg total) by mouth at bedtime.   benzonatate 100 MG capsule Commonly known as:  TESSALON Take 100 mg by mouth every 8 (eight) hours as needed for cough.   BREO ELLIPTA 100-25 MCG/INH Aepb Generic drug:  fluticasone furoate-vilanterol Inhale 2 puffs into the lungs 2 (two) times daily.   clobetasol cream 0.05 % Commonly known as:  TEMOVATE Apply 1 application topically 2 (two) times daily as needed (itching and inflammation).   diltiazem 120 MG 24 hr capsule Commonly known as:  CARDIZEM CD Take 1 capsule (120 mg total) by mouth daily.   donepezil 10 MG tablet Commonly known as:  ARICEPT Take 1 tablet (10 mg total) by mouth daily.   DULoxetine 60 MG capsule Commonly known as:  CYMBALTA Take 1 capsule by mouth daily. Reported on 08/05/2015   fluticasone 50 MCG/ACT nasal spray Commonly known as:  FLONASE USE 1 TO 2 SPRAYS IN EACH NOSTRIL ONCE DAILY FOR STUFFY NOSE OR DRAINAGE   folic acid 1 MG tablet Commonly known as:  FOLVITE Take 1 tablet (1 mg total) by mouth daily.   furosemide 20 MG tablet Commonly known as:  LASIX Take 1 tablet (20 mg total) by mouth daily.   lisinopril 20 MG tablet Commonly known as:  PRINIVIL,ZESTRIL Take 20 mg by mouth daily.   loperamide 2 MG tablet Commonly known as:  IMODIUM A-D Take 2 mg by mouth as needed for diarrhea or loose stools.   mirtazapine 15 MG tablet Commonly known as:  REMERON Take 15 mg by mouth at bedtime. Reported on 08/05/2015   omeprazole 20 MG capsule Commonly known as:  PRILOSEC Take 1 capsule (20 mg total) by mouth 2 (two) times daily before a meal. What changed:  when to take this   PROBIOTIC DAILY Caps Take 1 capsule by mouth daily.   ranitidine 300 MG tablet Commonly known as:  ZANTAC Take 1 tablet (300 mg total) by mouth at bedtime.   thiamine 100 MG  tablet Take 1 tablet (100 mg total) by mouth daily.   VENTOLIN HFA 108 (90 Base) MCG/ACT inhaler Generic drug:  albuterol Inhale 1 puff into the lungs every 12 (twelve) hours as needed for shortness of breath.      Follow-up Information     Melinda Crutch, MD. Schedule an appointment as soon as possible for a visit in 1 week(s).   Specialty:  Family Medicine Contact information: [place] [address] [phone]            The results of significant diagnostics from this hospitalization (including imaging, microbiology, ancillary and laboratory) are listed below for reference.    Significant Diagnostic Studies: Dg Chest  2 View  Result Date: 02/11/2016 CLINICAL DATA:  Lightheadedness. EXAM: CHEST  2 VIEW COMPARISON:  01/01/2016 FINDINGS: Previous median sternotomy and CABG procedure.Mild cardiac enlargement. No pleural effusion or edema. Lungs are clear. Spondylosis identified within the thoracic spine. IMPRESSION: 1. No acute findings. 2. Cardiac enlargement. Electronically Signed   By: Kerby Moors M.D.   On: 02/11/2016 23:17    Microbiology: No results found for this or any previous visit (from the past 240 hour(s)).   Labs: Basic Metabolic Panel:  Recent Labs Lab 02/11/16 2106 02/12/16 0801  NA 134* 138  K 3.9 3.8  CL 104 110  CO2 20* 21*  GLUCOSE 142* 114*  BUN 38* 25*  CREATININE 1.74* 1.11*  CALCIUM 9.0 8.7*   Liver Function Tests: No results for input(s): AST, ALT, ALKPHOS, BILITOT, PROT, ALBUMIN in the last 168 hours. No results for input(s): LIPASE, AMYLASE in the last 168 hours. No results for input(s): AMMONIA in the last 168 hours. CBC:  Recent Labs Lab 02/11/16 2106  WBC 17.3*  HGB 12.1  HCT 37.3  MCV 86.9  PLT 337   Cardiac Enzymes:  Recent Labs Lab 02/12/16 0801 02/12/16 1352  TROPONINI <0.03 <0.03   BNP: BNP (last 3 results)  Recent Labs  02/12/16 0801  BNP 651.0*    ProBNP (last 3 results) No  results for input(s): PROBNP in the last 8760 hours.  CBG:  Recent Labs Lab 02/11/16 2036 02/12/16 0743  GLUCAP 147* 120*

## 2016-02-13 NOTE — Progress Notes (Signed)
Pt states her daughter Jodi Mourning work at [employer], she is Clinical biochemist [phone] ext [phone].

## 2016-02-13 NOTE — Progress Notes (Signed)
Patient refusing to wear cardiac monitor. Patient being combative and attempting to pull IV out. AC called to bedside to talk to the patient. Patient still refusing cardiac monitor, bed alarm, and to stay in the room. NP on called notified. Will continue to monitor.

## 2016-02-13 NOTE — Progress Notes (Signed)
Patient refusing the cardiac monitor and bed alarm still this morning. Patient also being verbally abusive and threatening to become combative. Safety sitter at bedside.

## 2016-02-13 NOTE — Progress Notes (Signed)
Pt's daughter Jodi Mourning [phone] was called several times, VM is full, not able to leave a message.

## 2016-02-13 NOTE — Progress Notes (Signed)
Pt is active with [facility] [phone] #1, fax [phone]. Pt was active with HHRN/PT/OT/NA and will continue at discharge.

## 2016-02-14 ENCOUNTER — Inpatient Hospital Stay (HOSPITAL_COMMUNITY)
Admission: EM | Admit: 2016-02-14 | Discharge: 2016-02-17 | DRG: 682 | Disposition: A | Discharge status: Skilled Nursing Facility | Payer: Medicare HMO | Attending: Internal Medicine | Admitting: Internal Medicine

## 2016-02-14 ENCOUNTER — Emergency Department (HOSPITAL_COMMUNITY): Payer: Medicare HMO

## 2016-02-14 ENCOUNTER — Encounter (HOSPITAL_COMMUNITY): Payer: Self-pay

## 2016-02-14 DIAGNOSIS — R001 Bradycardia, unspecified: Secondary | ICD-10-CM | POA: Diagnosis present

## 2016-02-14 DIAGNOSIS — R262 Difficulty in walking, not elsewhere classified: Secondary | ICD-10-CM

## 2016-02-14 DIAGNOSIS — K5730 Diverticulosis of large intestine without perforation or abscess without bleeding: Secondary | ICD-10-CM | POA: Diagnosis present

## 2016-02-14 DIAGNOSIS — K5790 Diverticulosis of intestine, part unspecified, without perforation or abscess without bleeding: Secondary | ICD-10-CM

## 2016-02-14 DIAGNOSIS — F329 Major depressive disorder, single episode, unspecified: Secondary | ICD-10-CM | POA: Diagnosis present

## 2016-02-14 DIAGNOSIS — I10 Essential (primary) hypertension: Secondary | ICD-10-CM | POA: Diagnosis present

## 2016-02-14 DIAGNOSIS — K219 Gastro-esophageal reflux disease without esophagitis: Secondary | ICD-10-CM | POA: Diagnosis present

## 2016-02-14 DIAGNOSIS — Z91011 Allergy to milk products: Secondary | ICD-10-CM

## 2016-02-14 DIAGNOSIS — I25119 Atherosclerotic heart disease of native coronary artery with unspecified angina pectoris: Secondary | ICD-10-CM | POA: Diagnosis present

## 2016-02-14 DIAGNOSIS — R42 Dizziness and giddiness: Secondary | ICD-10-CM

## 2016-02-14 DIAGNOSIS — D72829 Elevated white blood cell count, unspecified: Secondary | ICD-10-CM | POA: Diagnosis present

## 2016-02-14 DIAGNOSIS — E871 Hypo-osmolality and hyponatremia: Secondary | ICD-10-CM | POA: Diagnosis present

## 2016-02-14 DIAGNOSIS — E860 Dehydration: Secondary | ICD-10-CM | POA: Diagnosis present

## 2016-02-14 DIAGNOSIS — Z8249 Family history of ischemic heart disease and other diseases of the circulatory system: Secondary | ICD-10-CM

## 2016-02-14 DIAGNOSIS — R5383 Other fatigue: Secondary | ICD-10-CM

## 2016-02-14 DIAGNOSIS — N182 Chronic kidney disease, stage 2 (mild): Secondary | ICD-10-CM | POA: Diagnosis present

## 2016-02-14 DIAGNOSIS — G9340 Encephalopathy, unspecified: Secondary | ICD-10-CM | POA: Diagnosis present

## 2016-02-14 DIAGNOSIS — R1030 Lower abdominal pain, unspecified: Secondary | ICD-10-CM

## 2016-02-14 DIAGNOSIS — R4182 Altered mental status, unspecified: Secondary | ICD-10-CM

## 2016-02-14 DIAGNOSIS — R109 Unspecified abdominal pain: Secondary | ICD-10-CM | POA: Diagnosis present

## 2016-02-14 DIAGNOSIS — F32A Depression, unspecified: Secondary | ICD-10-CM | POA: Diagnosis present

## 2016-02-14 DIAGNOSIS — R7989 Other specified abnormal findings of blood chemistry: Secondary | ICD-10-CM | POA: Diagnosis present

## 2016-02-14 DIAGNOSIS — Z880 Allergy status to penicillin: Secondary | ICD-10-CM

## 2016-02-14 DIAGNOSIS — R6510 Systemic inflammatory response syndrome (SIRS) of non-infectious origin without acute organ dysfunction: Secondary | ICD-10-CM

## 2016-02-14 DIAGNOSIS — I509 Heart failure, unspecified: Secondary | ICD-10-CM | POA: Diagnosis present

## 2016-02-14 DIAGNOSIS — I129 Hypertensive chronic kidney disease with stage 1 through stage 4 chronic kidney disease, or unspecified chronic kidney disease: Secondary | ICD-10-CM | POA: Diagnosis present

## 2016-02-14 DIAGNOSIS — E785 Hyperlipidemia, unspecified: Secondary | ICD-10-CM | POA: Diagnosis present

## 2016-02-14 DIAGNOSIS — Z7982 Long term (current) use of aspirin: Secondary | ICD-10-CM

## 2016-02-14 DIAGNOSIS — E872 Acidosis: Secondary | ICD-10-CM

## 2016-02-14 DIAGNOSIS — Z79899 Other long term (current) drug therapy: Secondary | ICD-10-CM

## 2016-02-14 DIAGNOSIS — Z951 Presence of aortocoronary bypass graft: Secondary | ICD-10-CM

## 2016-02-14 DIAGNOSIS — N179 Acute kidney failure, unspecified: Principal | ICD-10-CM | POA: Diagnosis present

## 2016-02-14 DIAGNOSIS — K589 Irritable bowel syndrome without diarrhea: Secondary | ICD-10-CM | POA: Diagnosis present

## 2016-02-14 DIAGNOSIS — I480 Paroxysmal atrial fibrillation: Secondary | ICD-10-CM | POA: Diagnosis present

## 2016-02-14 DIAGNOSIS — J449 Chronic obstructive pulmonary disease, unspecified: Secondary | ICD-10-CM | POA: Diagnosis present

## 2016-02-14 DIAGNOSIS — Z87891 Personal history of nicotine dependence: Secondary | ICD-10-CM

## 2016-02-14 DIAGNOSIS — R413 Other amnesia: Secondary | ICD-10-CM

## 2016-02-14 LAB — ETHANOL: Alcohol, Ethyl (B): <5 mg/dL (ref <5)

## 2016-02-14 LAB — I-STAT CG4 LACTIC ACID, ED: Lactic Acid, Venous: 2.86 mmol/L (ref 0.5–1.9)

## 2016-02-14 LAB — URINALYSIS, ROUTINE W REFLEX MICROSCOPIC
Bilirubin Urine: NEGATIVE
Glucose, UA: NEGATIVE mg/dL
Hgb urine dipstick: NEGATIVE
Ketones, ur: NEGATIVE mg/dL
Leukocytes, UA: NEGATIVE
Nitrite: NEGATIVE
Protein, ur: NEGATIVE mg/dL
Specific Gravity, Urine: 1.009 (ref 1.005–1.030)
pH: 5.5 (ref 5.0–8.0)

## 2016-02-14 LAB — CBC WITH DIFFERENTIAL/PLATELET
Basophils Absolute: 0 10*3/uL (ref 0.0–0.1)
Basophils Relative: 0 %
Eosinophils Absolute: 0 10*3/uL (ref 0.0–0.7)
Eosinophils Relative: 0 %
HCT: 40.3 % (ref 36.0–46.0)
Hemoglobin: 13.5 g/dL (ref 12.0–15.0)
Lymphocytes Relative: 15 %
Lymphs Abs: 2.1 10*3/uL (ref 0.7–4.0)
MCH: 28.7 pg (ref 26.0–34.0)
MCHC: 33.5 g/dL (ref 30.0–36.0)
MCV: 85.7 fL (ref 78.0–100.0)
Monocytes Absolute: 0.8 10*3/uL (ref 0.1–1.0)
Monocytes Relative: 6 %
Neutro Abs: 11 10*3/uL — ABNORMAL HIGH (ref 1.7–7.7)
Neutrophils Relative %: 79 %
Platelets: 434 10*3/uL — ABNORMAL HIGH (ref 150–400)
RBC: 4.7 MIL/uL (ref 3.87–5.11)
RDW: 14 % (ref 11.5–15.5)
WBC: 14 10*3/uL — ABNORMAL HIGH (ref 4.0–10.5)

## 2016-02-14 LAB — COMPREHENSIVE METABOLIC PANEL
ALT: 19 U/L (ref 14–54)
AST: 26 U/L (ref 15–41)
Albumin: 4.5 g/dL (ref 3.5–5.0)
Alkaline Phosphatase: 90 U/L (ref 38–126)
Anion gap: 14 (ref 5–15)
BUN: 19 mg/dL (ref 6–20)
CO2: 23 mmol/L (ref 22–32)
Calcium: 9.6 mg/dL (ref 8.9–10.3)
Chloride: 95 mmol/L — ABNORMAL LOW (ref 101–111)
Creatinine, Ser: 1.05 mg/dL — ABNORMAL HIGH (ref 0.44–1.00)
GFR calc Af Amer: 55 mL/min — ABNORMAL LOW (ref >60)
GFR calc non Af Amer: 48 mL/min — ABNORMAL LOW (ref >60)
Glucose, Bld: 108 mg/dL — ABNORMAL HIGH (ref 65–99)
Potassium: 4 mmol/L (ref 3.5–5.1)
Sodium: 132 mmol/L — ABNORMAL LOW (ref 135–145)
Total Bilirubin: 0.6 mg/dL (ref 0.3–1.2)
Total Protein: 8.5 g/dL — ABNORMAL HIGH (ref 6.5–8.1)

## 2016-02-14 LAB — I-STAT TROPONIN, ED: Troponin i, poc: 0.01 ng/mL (ref 0.00–0.08)

## 2016-02-14 LAB — BRAIN NATRIURETIC PEPTIDE: B Natriuretic Peptide: 166.4 pg/mL — ABNORMAL HIGH (ref 0.0–100.0)

## 2016-02-14 LAB — MAGNESIUM: Magnesium: 1.7 mg/dL (ref 1.7–2.4)

## 2016-02-14 MED ORDER — ATORVASTATIN CALCIUM 10 MG PO TABS
10.0000 mg | ORAL_TABLET | Freq: Every day | ORAL | Status: DC
  Administered 2016-02-14 – 2016-02-16 (×3): 10 mg via ORAL
  Filled 2016-02-14 (×3): qty 1

## 2016-02-14 MED ORDER — CLOBETASOL PROPIONATE 0.05 % EX CREA
1.0000 "application " | TOPICAL_CREAM | Freq: Two times a day (BID) | CUTANEOUS | Status: DC | PRN
  Filled 2016-02-14: qty 15

## 2016-02-14 MED ORDER — VITAMIN B-1 100 MG PO TABS
100.0000 mg | ORAL_TABLET | Freq: Every day | ORAL | Status: DC
  Administered 2016-02-15 – 2016-02-17 (×3): 100 mg via ORAL
  Filled 2016-02-14 (×3): qty 1

## 2016-02-14 MED ORDER — RISAQUAD PO CAPS
1.0000 | ORAL_CAPSULE | Freq: Every day | ORAL | Status: DC
  Administered 2016-02-15 – 2016-02-17 (×3): 1 via ORAL
  Filled 2016-02-14 (×3): qty 1

## 2016-02-14 MED ORDER — SODIUM CHLORIDE 0.9% FLUSH
3.0000 mL | Freq: Two times a day (BID) | INTRAVENOUS | Status: DC
  Administered 2016-02-15 (×2): 3 mL via INTRAVENOUS

## 2016-02-14 MED ORDER — FLUTICASONE FUROATE-VILANTEROL 100-25 MCG/INH IN AEPB
2.0000 | INHALATION_SPRAY | Freq: Two times a day (BID) | RESPIRATORY_TRACT | Status: DC
  Administered 2016-02-15 – 2016-02-17 (×4): 2 via RESPIRATORY_TRACT
  Filled 2016-02-14: qty 28

## 2016-02-14 MED ORDER — ONDANSETRON HCL 4 MG/2ML IJ SOLN: 4.0000 mg | Freq: Four times a day (QID) | INTRAMUSCULAR | Status: DC | PRN

## 2016-02-14 MED ORDER — ONDANSETRON HCL 4 MG PO TABS: 4.0000 mg | ORAL_TABLET | Freq: Four times a day (QID) | ORAL | Status: DC | PRN

## 2016-02-14 MED ORDER — ACETAMINOPHEN 325 MG PO TABS
650.0000 mg | ORAL_TABLET | Freq: Once | ORAL | Status: AC
Start: 2016-02-14 — End: 2016-02-14
  Administered 2016-02-14: 650 mg via ORAL
  Filled 2016-02-14: qty 2

## 2016-02-14 MED ORDER — SODIUM CHLORIDE 0.9 % IV SOLN: Freq: Once | INTRAVENOUS | Status: DC

## 2016-02-14 MED ORDER — DULOXETINE HCL 60 MG PO CPEP
60.0000 mg | ORAL_CAPSULE | Freq: Every day | ORAL | Status: DC
  Administered 2016-02-15 – 2016-02-17 (×3): 60 mg via ORAL
  Filled 2016-02-14 (×3): qty 1

## 2016-02-14 MED ORDER — DONEPEZIL HCL 10 MG PO TABS
10.0000 mg | ORAL_TABLET | Freq: Every day | ORAL | Status: DC
  Administered 2016-02-15 – 2016-02-17 (×3): 10 mg via ORAL
  Filled 2016-02-14 (×3): qty 1

## 2016-02-14 MED ORDER — ASPIRIN EC 81 MG PO TBEC
81.0000 mg | DELAYED_RELEASE_TABLET | Freq: Every day | ORAL | Status: DC
  Administered 2016-02-15 – 2016-02-17 (×3): 81 mg via ORAL
  Filled 2016-02-14 (×3): qty 1

## 2016-02-14 MED ORDER — PANTOPRAZOLE SODIUM 40 MG PO TBEC
40.0000 mg | DELAYED_RELEASE_TABLET | Freq: Every day | ORAL | Status: DC
  Administered 2016-02-15 – 2016-02-17 (×3): 40 mg via ORAL
  Filled 2016-02-14 (×3): qty 1

## 2016-02-14 MED ORDER — ALBUTEROL SULFATE (2.5 MG/3ML) 0.083% IN NEBU: 2.5000 mg | INHALATION_SOLUTION | Freq: Four times a day (QID) | RESPIRATORY_TRACT | Status: DC | PRN

## 2016-02-14 MED ORDER — SODIUM CHLORIDE 0.9 % IV SOLN
INTRAVENOUS | Status: AC
  Administered 2016-02-14: via INTRAVENOUS

## 2016-02-14 MED ORDER — MIRTAZAPINE 30 MG PO TABS
15.0000 mg | ORAL_TABLET | Freq: Every day | ORAL | Status: DC
  Administered 2016-02-14 – 2016-02-16 (×3): 15 mg via ORAL
  Filled 2016-02-14: qty 0.5
  Filled 2016-02-14 (×2): qty 1
  Filled 2016-02-14 (×3): qty 0.5

## 2016-02-14 MED ORDER — HEPARIN SODIUM (PORCINE) 5000 UNIT/ML IJ SOLN
5000.0000 [IU] | Freq: Three times a day (TID) | INTRAMUSCULAR | Status: DC
  Administered 2016-02-14 – 2016-02-17 (×9): 5000 [IU] via SUBCUTANEOUS
  Filled 2016-02-14 (×9): qty 1

## 2016-02-14 MED ORDER — BENZONATATE 100 MG PO CAPS: 100.0000 mg | ORAL_CAPSULE | Freq: Three times a day (TID) | ORAL | Status: DC | PRN

## 2016-02-14 MED ORDER — DILTIAZEM HCL ER COATED BEADS 120 MG PO CP24
120.0000 mg | ORAL_CAPSULE | Freq: Every day | ORAL | Status: DC
  Administered 2016-02-15 – 2016-02-17 (×3): 120 mg via ORAL
  Filled 2016-02-14 (×3): qty 1

## 2016-02-14 MED ORDER — ACETAMINOPHEN 500 MG PO TABS
1000.0000 mg | ORAL_TABLET | Freq: Three times a day (TID) | ORAL | Status: DC | PRN
  Administered 2016-02-15 – 2016-02-17 (×3): 1000 mg via ORAL
  Filled 2016-02-14 (×3): qty 2

## 2016-02-14 MED ORDER — ALBUTEROL SULFATE HFA 108 (90 BASE) MCG/ACT IN AERS: 1.0000 | INHALATION_SPRAY | Freq: Two times a day (BID) | RESPIRATORY_TRACT | Status: DC | PRN

## 2016-02-14 MED ORDER — SODIUM CHLORIDE 0.9 % IV BOLUS (SEPSIS)
500.0000 mL | Freq: Once | INTRAVENOUS | Status: AC
  Administered 2016-02-14: 500 mL via INTRAVENOUS

## 2016-02-14 MED ORDER — FAMOTIDINE 20 MG PO TABS
40.0000 mg | ORAL_TABLET | Freq: Every day | ORAL | Status: DC
  Administered 2016-02-14 – 2016-02-15 (×2): 40 mg via ORAL
  Filled 2016-02-14 (×2): qty 2

## 2016-02-14 MED ORDER — ACETAMINOPHEN 325 MG PO TABS
325.0000 mg | ORAL_TABLET | Freq: Once | ORAL | Status: DC
  Filled 2016-02-14: qty 1

## 2016-02-14 MED ORDER — FOLIC ACID 1 MG PO TABS
1.0000 mg | ORAL_TABLET | Freq: Every day | ORAL | Status: DC
  Administered 2016-02-15 – 2016-02-17 (×3): 1 mg via ORAL
  Filled 2016-02-14 (×3): qty 1

## 2016-02-14 NOTE — H&P (Signed)
History and Physical    Tammy [name] [id number] DOB: 01/09/1931 DOA: 02/14/2016  PCP:  Melinda Crutch, MD   Patient coming from: Home   Chief Complaint: Lightheadedness, confusion, lower abdominal pain   HPI: Tammy [name] is a 82 y.o. female with medical history significant for coronary artery disease, hypertension, paroxysmal atrial fibrillation, GERD, and memory loss who presents the emergency department for evaluation of lightheadedness upon standing, nausea, lower abdominal pain, and confusion. Patient is accompanied by her daughter who assists with the history. She was just admitted to this institution with symptomatic bradycardia and discharged in improved and stable condition on 02/13/2016. Her metoprolol was discontinued and heart rate improved and stabilized. Despite this, the patient has continued to be lightheaded at home and has now developed marked worsening in her baseline confusion. Her daughter reports that she has been incontinent of stool today and acting in a bizarre fashion, walking around the house naked and urinating on the floor. Patient lives alone and had a home health nurse out today who recorded a heart rate of 50 bpm. Patient describes a lower abdominal pain as cramping, moderate in intensity, associated with chills and malaise, but no fevers, and reminiscent of her prior experience with diverticulitis. Patient denies any illicit drug use or recent alcohol use. There has been no fall or trauma since the recent discharge.  ED Course: Upon arrival to the ED, patient is found to be afebrile, saturating well on room air, bradycardic in the high 50s, and vitals otherwise stable. EKG demonstrates a sinus rhythm with LVH by voltage criteria. Noncontrast head CT is negative for acute intracranial abnormality and chest x-ray is negative for acute cardiopulmonary disease. Chemistry panel is notable for a hyponatremia and hypochloremia and CBC features a leukocytosis to 14,000 and a  thrombocytosis of 434,000. Lactic acid is elevated to a value of 2.86, troponin is within the normal limits, and BNP is mildly elevated to 166. Urinalysis is unremarkable. Patient was given a 500 mL normal saline bolus and a dose of Tylenol. Heart rate remained in the mid to high 50s and blood pressure remained stable while in the ED. Patient will be observed on the telemetry unit for ongoing evaluation and management of acute encephalopathy with hyponatremia, leukocytosis, and elevated lactic acid.  Review of Systems:  All other systems reviewed and apart from HPI, are negative.  Past Medical History:  Diagnosis Date  . Acid reflux   . Asthma    Asthmatic COPD  . Atrial fibrillation ([place]) 01/01/2016  . Coronary artery disease    Echo 07/27/2011   . Coronary artery stenosis    , High-grade left main  . GERD (gastroesophageal reflux disease)   . Hyperlipidemia   . Hypertension   . IBS (irritable bowel syndrome)   . Psoriasis     Past Surgical History:  Procedure Laterality Date  . CARDIAC SURGERY    . CORONARY ARTERY BYPASS GRAFT  1994   After catheterization showed 90% ostial left main stenosis  . DOPPLER ECHOCARDIOGRAPHY     Study showed mild to moderate MR with moderate TR, mild to moderate pulmonary hypertension with an ejection fraction greater that 55%.   [name] EYE SURGERY    . Nuclear Perfusion  10/2010, 11/15/2012   Showed normal perfusion, No Ischemia or Infarction, Normal EF  . Renal Scan Duplex  04/09/2012   Showing greater than 50% diameter reduction in the celiac artery and SMA.. Right proximal 275 and Mid 152.  [name] ROTATOR  CUFF REPAIR     bilateral  . Sleep Study  12/28/2006   AHI during total sleep time (3h 19 minutes) was 1.81/hr and during REM sleep at 17.78/hr. Mild sleep apnea during REM sleep.Oxygen staturated rate during REM and NREM was 93.0%.     reports that she quit smoking about 53 years ago. She has never used smokeless tobacco. She reports that she drinks  alcohol. She reports that she does not use drugs.  Allergies  Allergen Reactions  . Penicillins Hives    Has patient had a PCN reaction causing immediate rash, facial/tongue/throat swelling, SOB or lightheadedness with hypotension: No Has patient had a PCN reaction causing severe rash involving mucus membranes or skin necrosis: Yes Has patient had a PCN reaction that required hospitalization No Has patient had a PCN reaction occurring within the last 10 years: Yes took again last year 2016 If all of the above answers are "NO", then may proceed with Cephalosporin use.   . Lactose Intolerance (Gi)     Unknown- [facility] documented    Family History  Problem Relation Age of Onset  . Heart disease Mother   . Hypertension Sister      Prior to Admission medications   Medication Sig Start Date End Date Taking? Authorizing Provider  acetaminophen (TYLENOL) 500 MG tablet Take 1,000 mg by mouth every 8 (eight) hours as needed for moderate pain.   Yes Historical Provider, MD  aspirin 81 MG tablet Take 81 mg by mouth daily.    Yes Historical Provider, MD  atorvastatin (LIPITOR) 10 MG tablet Take 1 tablet (10 mg total) by mouth at bedtime. 01/06/16  Yes Ripudeep Krystal Eaton, MD  benzonatate (TESSALON) 100 MG capsule Take 100 mg by mouth every 8 (eight) hours as needed for cough.   Yes Historical Provider, MD  clobetasol cream (TEMOVATE) [id number] % Apply 1 application topically 2 (two) times daily as needed (itching and inflammation).   Yes Historical Provider, MD  diltiazem (CARDIZEM CD) 120 MG 24 hr capsule Take 1 capsule (120 mg total) by mouth daily. 01/06/16  Yes Ripudeep Krystal Eaton, MD  donepezil (ARICEPT) 10 MG tablet Take 1 tablet (10 mg total) by mouth daily. 07/13/14  Yes Marcial Pacas, MD  DULoxetine (CYMBALTA) 60 MG capsule Take 1 capsule by mouth daily. Reported on 08/05/2015 09/13/14  Yes Historical Provider, MD  fluticasone (FLONASE) 50 MCG/ACT nasal spray USE 1 TO 2 SPRAYS IN EACH  NOSTRIL ONCE DAILY FOR STUFFY NOSE OR DRAINAGE 07/18/15  Yes Jiles Prows, MD  fluticasone furoate-vilanterol (BREO ELLIPTA) 100-25 MCG/INH AEPB Inhale 2 puffs into the lungs 2 (two) times daily.   Yes Historical Provider, MD  folic acid (FOLVITE) 1 MG tablet Take 1 tablet (1 mg total) by mouth daily. 01/06/16  Yes Ripudeep Krystal Eaton, MD  furosemide (LASIX) 20 MG tablet Take 1 tablet (20 mg total) by mouth daily. 09/10/15  Yes [name], MD  lisinopril (PRINIVIL,ZESTRIL) 20 MG tablet Take 20 mg by mouth daily.   Yes Historical Provider, MD  loperamide (IMODIUM A-D) 2 MG tablet Take 2 mg by mouth as needed for diarrhea or loose stools.   Yes Historical Provider, MD  mirtazapine (REMERON) 15 MG tablet Take 15 mg by mouth at bedtime. Reported on 08/05/2015   Yes Historical Provider, MD  omeprazole (PRILOSEC) 20 MG capsule Take 1 capsule (20 mg total) by mouth 2 (two) times daily before a meal. Patient taking differently: Take 20 mg by  mouth daily.  08/05/15  Yes Jiles Prows, MD  Probiotic Product (PROBIOTIC DAILY) CAPS Take 1 capsule by mouth daily.   Yes Historical Provider, MD  ranitidine (ZANTAC) 300 MG tablet Take 1 tablet (300 mg total) by mouth at bedtime. 07/21/15  Yes [name], MD  thiamine 100 MG tablet Take 1 tablet (100 mg total) by mouth daily. 01/06/16  Yes Ripudeep Krystal Eaton, MD  VENTOLIN HFA 108 (90 BASE) MCG/ACT inhaler Inhale 1 puff into the lungs every 12 (twelve) hours as needed for shortness of breath.  11/27/12  Yes Historical Provider, MD    Physical Exam: Vitals:   02/14/16 1451 02/14/16 1707 02/14/16 1728 02/14/16 1909  BP:  (!) 138/53  (!) 128/46  Pulse:  (!) 59  (!) 57  Resp:  22  22  Temp:   97.4 F (36.3 C)   TempSrc:   Rectal   SpO2:  98%  98%  Weight: 59 kg (130 lb)     Height: 5' (1.524 m)         Constitutional: NAD, calm, comfortable Eyes: PERTLA, lids and conjunctivae normal ENMT: Mucous membranes are dry. Posterior pharynx clear of any exudate or lesions.    Neck: normal, supple, no masses, no thyromegaly Respiratory: clear to auscultation bilaterally, no wheezing, no crackles. Normal respiratory effort. No accessory muscle use.  Cardiovascular: Rate ~60 and regular, no significant murmur. No extremity edema. No significant JVD. Abdomen: No distension, tender in lower quadrants, Left > Right, no masses palpated. Bowel sounds normal.  Musculoskeletal: no clubbing / cyanosis. No joint deformity upper and lower extremities. Normal muscle tone.  Skin: no significant rashes, lesions, ulcers. Warm, dry, well-perfused. Neurologic: CN 2-12 grossly intact. Sensation intact, DTR normal. Strength 5/5 in all 4 limbs.  Psychiatric: Intermittent confusion. Alert and oriented to person and place, but not month or year. Normal mood and affect.     Labs on Admission: I have personally reviewed following labs and imaging studies  CBC:  Recent Labs Lab 02/11/16 2106 02/14/16 1727  WBC 17.3* 14.0*  NEUTROABS  --  11.0*  HGB 12.1 13.5  HCT 37.3 40.3  MCV 86.9 85.7  PLT 337 [id number]*   Basic Metabolic Panel:  Recent Labs Lab 02/11/16 2106 02/12/16 0801 02/14/16 1727  NA 134* 138 132*  K 3.9 3.8 4.0  CL 104 110 95*  CO2 20* 21* 23  GLUCOSE 142* 114* 108*  BUN 38* 25* 19  CREATININE 1.74* 1.11* 1.05*  CALCIUM 9.0 8.7* 9.6  MG  --   --  1.7   GFR: Estimated Creatinine Clearance: 32.6 mL/min (by C-G formula based on SCr of 1.05 mg/dL (H)). Liver Function Tests:  Recent Labs Lab 02/14/16 1727  AST 26  ALT 19  ALKPHOS 90  BILITOT 0.6  PROT 8.5*  ALBUMIN 4.5   No results for input(s): LIPASE, AMYLASE in the last 168 hours. No results for input(s): AMMONIA in the last 168 hours. Coagulation Profile: No results for input(s): INR, PROTIME in the last 168 hours. Cardiac Enzymes:  Recent Labs Lab 02/12/16 0801 02/12/16 1352  TROPONINI <0.03 <0.03   BNP (last 3 results) No results for input(s): PROBNP in the last 8760 hours. HbA1C: No  results for input(s): HGBA1C in the last 72 hours. CBG:  Recent Labs Lab 02/11/16 2036 02/12/16 0743  GLUCAP 147* 120*   Lipid Profile: No results for input(s): CHOL, HDL, LDLCALC, TRIG, CHOLHDL, LDLDIRECT in the last 72 hours. Thyroid Function Tests:  Recent Labs  02/12/16 0801  TSH 0.808   Anemia Panel: No results for input(s): VITAMINB12, FOLATE, FERRITIN, TIBC, IRON, RETICCTPCT in the last 72 hours. Urine analysis:    Component Value Date/Time   COLORURINE YELLOW 02/14/2016 1632   APPEARANCEUR CLEAR 02/14/2016 1632   LABSPEC 1.009 02/14/2016 1632   PHURINE 5.5 02/14/2016 1632   GLUCOSEU NEGATIVE 02/14/2016 1632   HGBUR NEGATIVE 02/14/2016 1632   BILIRUBINUR NEGATIVE 02/14/2016 1632   KETONESUR NEGATIVE 02/14/2016 1632   PROTEINUR NEGATIVE 02/14/2016 1632   UROBILINOGEN 0.2 07/11/2009 2220   NITRITE NEGATIVE 02/14/2016 1632   LEUKOCYTESUR NEGATIVE 02/14/2016 1632   Sepsis Labs: @LABRCNTIP (procalcitonin:4,lacticidven:4) )No results found for this or any previous visit (from the past 240 hour(s)).   Radiological Exams on Admission: Dg Chest 2 View  Result Date: 02/14/2016 CLINICAL DATA:  82 year old female with cramping of bilateral legs today. History of asthma. History of atrial fibrillation. Former smoker. EXAM: CHEST  2 VIEW COMPARISON:  Chest x-ray 02/11/2016. FINDINGS: No acute consolidative airspace disease. No pleural effusions. No suspicious appearing pulmonary nodules or masses. No pneumothorax. Mild cardiomegaly. No evidence of pulmonary edema. Upper mediastinal contours are within normal limits. Aortic atherosclerosis. Status post median sternotomy for CABG. IMPRESSION: 1. No radiographic evidence of acute cardiopulmonary disease. 2. Mild cardiomegaly. 3. Aortic atherosclerosis. Electronically Signed   By: Vinnie Langton M.D.   On: 02/14/2016 17:13   Ct Head Wo Contrast  Result Date: 02/14/2016 CLINICAL DATA:  Altered mental status and headache EXAM: CT  HEAD WITHOUT CONTRAST TECHNIQUE: Contiguous axial images were obtained from the base of the skull through the vertex without intravenous contrast. COMPARISON:  Head CT 12/31/2015 FINDINGS: Brain: No mass lesion, intraparenchymal hemorrhage or extra-axial collection. No evidence of acute cortical infarct. There is periventricular hypoattenuation compatible with chronic microvascular disease. There is bilateral basal ganglia mineralization. Old right cerebellar infarct. Vascular: No hyperdense vessel or unexpected calcification. Skull: Normal visualized skull base, calvarium and extracranial soft tissues. Sinuses/Orbits: No sinus fluid levels or advanced mucosal thickening. No mastoid effusion. Normal orbits. IMPRESSION: 1. No acute intracranial abnormality. 2. Unchanged remote right cerebellar infarct and findings of chronic microvascular disease. Electronically Signed   By: Ulyses Jarred M.D.   On: 02/14/2016 21:06    EKG: Independently reviewed. Sinus rhythm, LVH by voltage criteria   Assessment/Plan  1. Acute encephalopathy  - Pt has hx of memory loss and occasional mild confusion, but has been disoriented today and exhibiting bizarre behavior  - Uncertain etiology; head CT neg for acute intracranial abnormality and no focal deficit; mild hyponatremia noted, infectious etiology considered given leukocytosis and elevated lactate with abd pain; EtOH <5, TSH wnl this month - Plan for an infectious work-up with cultures and abdominal imaging as below  - Check RPR, B12, free T4, ammonia, UDS  - Correct hyponatremia with gentle NS infusion    2. Lower abdominal pain  - Pt has crampy abdominal pain in LLQ reminiscent of prior experiences with diverticulitis  - She has leukocytosis and elevated lactate on admission and there is significant tenderness on exam  - CT abd/pelvis ordered, not yet performed    3. Hyponatremia  - Serum sodium 132 in the setting of dehydration  - 500 cc NS given in ED and she  will be continued on NS at 75 cc/hr overnight  - Repeat chem panel in am   4. Bradycardia  - Admitted just a cpl days prior for symptomatic bradycardia with rate in mid 40's  -  Metoprolol was discontinued and her HR has been stable in high 50's in the ED  - Troponin is negative and there are no acute ischemic features noted on EKG - Monitor on telemetry    5. Paroxysmal atrial fibrillation - Appears to be in sinus bradycardia on admission  - Recent TTE without significant valvular disease and only mild left atrial enlargement  - CHADS-VASc at least 14 (age x2, gender, HTN, CAD, CHF) - She is followed by cardiology and not on anticoagulation due to the ongoing fall risk  - Monitor on telemetry  - Continue diltiazem as tolerated   6. CAD - Hx of CABG in 1994; normal nuc med stress test in July 2016  - No angina on admission, troponin 0.01; no acute ischemic features on admission EKG  - Continue Lipitor and daily ASA - Hold ACE in light of dehydration with recent AKI; resume as appropriate    7. Depression - Appears to be stable - Continue Cymbalta   8. Memory loss  - Continue Aricept    DVT prophylaxis: sq heparin  Code Status: Full  Family Communication: Daughter updated at bedside Disposition Plan: Observe on telemetry Consults called: None Admission status: Observation    Vianne Bulls, MD [facility] Pager [phone]  If 7PM-7AM, please contact night-coverage [email or web address] Password [email or web address]  02/14/2016, 10:39 PM

## 2016-02-14 NOTE — ED Provider Notes (Signed)
[facility] DEPT Provider Note   CSN: [id number] Arrival date & time: 02/14/16  1433     History   Chief Complaint Chief Complaint  Patient presents with  . Bradycardia    HPI Tammy [name] is a 82 y.o. female.  HPI   Patient is an 82 year old female to history of CAD, A. fib, GERD, HTN, bradycardia who presents emergency Department with "I just feel bad". She stated I feel bad all over. Patient was admitted to the hospital 4 days ago with symptomatically bradycardia. She left AMA yesterday. Patient and daughter are the historians. Daughter states that the home health nurse arrived the patient's house today roughly around 1:00. Patient was found to have a heart rate of 50. Nurse's just that EMS be called. Patient states she took her medications this morning around noon. She states she did not take metoprolol. Daughter is unsure of what medications patient actually took. Pt endorses chest pain but states she always has it and "doesn't pay no mind" to it. Daughter found pt sitting in her underwear in her living room today. Daughter states pt has a hx of ETOH abuse and her demeanor over the last few days reminds her of when her mother drinks. She does not believe the pt has had any access to ETOH. Pt denies shortness of breath, abdominal pain, fevers.    Past Medical History:  Diagnosis Date  . Acid reflux   . Asthma    Asthmatic COPD  . Atrial fibrillation ([place]) 01/01/2016  . Coronary artery disease    Echo 07/27/2011   . Coronary artery stenosis    , High-grade left main  . GERD (gastroesophageal reflux disease)   . Hyperlipidemia   . Hypertension   . IBS (irritable bowel syndrome)   . Psoriasis     Patient Active Problem List   Diagnosis Date Noted  . Hyponatremia 02/14/2016  . Acute encephalopathy 02/14/2016  . Elevated lactic acid level 02/14/2016  . Dizziness 02/14/2016  . Lower abdominal pain 02/14/2016  . Symptomatic bradycardia 02/12/2016  . Leukocytosis  02/12/2016  . Depression 02/12/2016  . Renal insufficiency   . SOB (shortness of breath)   . Bradycardia on ECG 02/11/2016  . Atrial fibrillation with RVR ([place]) 01/01/2016  . PAF (paroxysmal atrial fibrillation) ([place]) 01/01/2016  . Alcohol dependence with withdrawal with complication ([place]) [id number]  . Fall 01/01/2016  . AKI (acute kidney injury) ([place])   . Dehydration   . Exertional dyspnea 09/11/2015  . Memory loss 11/13/2013  . Abnormal involuntary movements(781.0) 11/13/2013  . Pulmonary hypertension ([place]) 11/02/2013  . Fatigue 11/02/2013  . Coronary artery disease involving native coronary artery of native heart with angina pectoris ([place]) 11/16/2012  . Mitral regurgitation 11/16/2012  . Renal artery stenosis ([place]) 11/16/2012  . Hyperlipidemia with target LDL less than 70 11/16/2012  . Essential hypertension 11/16/2012    Past Surgical History:  Procedure Laterality Date  . CARDIAC SURGERY    . CORONARY ARTERY BYPASS GRAFT  1994   After catheterization showed 90% ostial left main stenosis  . DOPPLER ECHOCARDIOGRAPHY     Study showed mild to moderate MR with moderate TR, mild to moderate pulmonary hypertension with an ejection fraction greater that 55%.   [name] EYE SURGERY    . Nuclear Perfusion  10/2010, 11/15/2012   Showed normal perfusion, No Ischemia or Infarction, Normal EF  . Renal Scan Duplex  04/09/2012   Showing greater than 50% diameter reduction in the celiac artery and SMA.. Right  proximal 275 and Mid 152.  [name] ROTATOR CUFF REPAIR     bilateral  . Sleep Study  12/28/2006   AHI during total sleep time (3h 19 minutes) was 1.81/hr and during REM sleep at 17.78/hr. Mild sleep apnea during REM sleep.Oxygen staturated rate during REM and NREM was 93.0%.    OB History    No data available       Home Medications    Prior to Admission medications   Medication Sig Start Date End Date Taking? Authorizing Provider  acetaminophen (TYLENOL) 500 MG tablet Take 1,000 mg by  mouth every 8 (eight) hours as needed for moderate pain.   Yes Historical Provider, MD  aspirin 81 MG tablet Take 81 mg by mouth daily.    Yes Historical Provider, MD  atorvastatin (LIPITOR) 10 MG tablet Take 1 tablet (10 mg total) by mouth at bedtime. 01/06/16  Yes Ripudeep Krystal Eaton, MD  benzonatate (TESSALON) 100 MG capsule Take 100 mg by mouth every 8 (eight) hours as needed for cough.   Yes Historical Provider, MD  clobetasol cream (TEMOVATE) [id number] % Apply 1 application topically 2 (two) times daily as needed (itching and inflammation).   Yes Historical Provider, MD  diltiazem (CARDIZEM CD) 120 MG 24 hr capsule Take 1 capsule (120 mg total) by mouth daily. 01/06/16  Yes Ripudeep Krystal Eaton, MD  donepezil (ARICEPT) 10 MG tablet Take 1 tablet (10 mg total) by mouth daily. 07/13/14  Yes Marcial Pacas, MD  DULoxetine (CYMBALTA) 60 MG capsule Take 1 capsule by mouth daily. Reported on 08/05/2015 09/13/14  Yes Historical Provider, MD  fluticasone (FLONASE) 50 MCG/ACT nasal spray USE 1 TO 2 SPRAYS IN EACH NOSTRIL ONCE DAILY FOR STUFFY NOSE OR DRAINAGE 07/18/15  Yes Jiles Prows, MD  fluticasone furoate-vilanterol (BREO ELLIPTA) 100-25 MCG/INH AEPB Inhale 2 puffs into the lungs 2 (two) times daily.   Yes Historical Provider, MD  folic acid (FOLVITE) 1 MG tablet Take 1 tablet (1 mg total) by mouth daily. 01/06/16  Yes Ripudeep Krystal Eaton, MD  furosemide (LASIX) 20 MG tablet Take 1 tablet (20 mg total) by mouth daily. 09/10/15  Yes [name], MD  lisinopril (PRINIVIL,ZESTRIL) 20 MG tablet Take 20 mg by mouth daily.   Yes Historical Provider, MD  loperamide (IMODIUM A-D) 2 MG tablet Take 2 mg by mouth as needed for diarrhea or loose stools.   Yes Historical Provider, MD  mirtazapine (REMERON) 15 MG tablet Take 15 mg by mouth at bedtime. Reported on 08/05/2015   Yes Historical Provider, MD  omeprazole (PRILOSEC) 20 MG capsule Take 1 capsule (20 mg total) by mouth 2 (two) times daily before a meal. Patient taking differently: Take  20 mg by mouth daily.  08/05/15  Yes Jiles Prows, MD  Probiotic Product (PROBIOTIC DAILY) CAPS Take 1 capsule by mouth daily.   Yes Historical Provider, MD  ranitidine (ZANTAC) 300 MG tablet Take 1 tablet (300 mg total) by mouth at bedtime. 07/21/15  Yes [name], MD  thiamine 100 MG tablet Take 1 tablet (100 mg total) by mouth daily. 01/06/16  Yes Ripudeep Krystal Eaton, MD  VENTOLIN HFA 108 (90 BASE) MCG/ACT inhaler Inhale 1 puff into the lungs every 12 (twelve) hours as needed for shortness of breath.  11/27/12  Yes Historical Provider, MD    Family History Family History  Problem Relation Age of Onset  . Heart disease Mother   . Hypertension Sister     Social History Social  History  Substance Use Topics  . Smoking status: Former Smoker    Quit date: 11/08/1962  . Smokeless tobacco: Never Used  . Alcohol use 0.0 oz/week     Comment: drinks wine/beer daily     Allergies   Penicillins and Lactose intolerance (gi)   Review of Systems Review of Systems  Constitutional: Negative for fever.  HENT: Negative for trouble swallowing.   Eyes: Negative for visual disturbance.  Respiratory: Negative for chest tightness and shortness of breath.   Cardiovascular: Positive for chest pain. Negative for leg swelling.  Gastrointestinal: Positive for nausea. Negative for abdominal pain and vomiting.  Genitourinary: Negative for dysuria.  Musculoskeletal: Negative for back pain.  Allergic/Immunologic: Negative for immunocompromised state.  Neurological: Positive for dizziness. Negative for syncope, speech difficulty, weakness and headaches.     Physical Exam Updated Vital Signs BP 124/59 (BP Location: Left Arm)   Pulse (!) 58   Resp 18   Ht 5' (1.524 m)   Wt 59 kg   SpO2 97%   BMI 25.39 kg/m   Physical Exam  Constitutional: She appears well-developed and well-nourished. No distress.  HENT:  Head: Normocephalic and atraumatic.  Mouth/Throat: Uvula is midline, oropharynx is clear and  moist and mucous membranes are normal.  Eyes: Conjunctivae and EOM are normal. Pupils are equal, round, and reactive to light.  Cardiovascular: Normal heart sounds.  An irregular rhythm present. Bradycardia present.   Pulses:      Radial pulses are 2+ on the right side, and 2+ on the left side.  Pulmonary/Chest: Effort normal and breath sounds normal. No respiratory distress. She has no wheezes. She has no rales.  Abdominal: Soft. Bowel sounds are normal. She exhibits no distension. There is no guarding and no CVA tenderness.  Mild TTP to LLQ  Musculoskeletal: Normal range of motion. She exhibits no edema.  Neurological: She is alert. She has normal strength. No cranial nerve deficit or sensory deficit. Coordination normal. GCS eye subscore is 4. GCS verbal subscore is 5. GCS motor subscore is 6.  Skin: Skin is warm and dry. She is not diaphoretic.  Psychiatric: She has a normal mood and affect. Her behavior is normal.  Nursing note and vitals reviewed.   ED Treatments / Results  Labs (all labs ordered are listed, but only abnormal results are displayed) Labs Reviewed  COMPREHENSIVE METABOLIC PANEL - Abnormal; Notable for the following:       Result Value   Sodium 132 (*)    Chloride 95 (*)    Glucose, Bld 108 (*)    Creatinine, Ser 1.05 (*)    Total Protein 8.5 (*)    GFR calc non Af Amer 48 (*)    GFR calc Af Amer 55 (*)    All other components within normal limits  CBC WITH DIFFERENTIAL/PLATELET - Abnormal; Notable for the following:    WBC 14.0 (*)    Platelets 434 (*)    Neutro Abs 11.0 (*)    All other components within normal limits  BRAIN NATRIURETIC PEPTIDE - Abnormal; Notable for the following:    B Natriuretic Peptide 166.4 (*)    All other components within normal limits  I-STAT CG4 LACTIC ACID, ED - Abnormal; Notable for the following:    Lactic Acid, Venous 2.86 (*)    All other components within normal limits  CULTURE, BLOOD (ROUTINE X 2)  CULTURE, BLOOD  (ROUTINE X 2)  URINE CULTURE  URINALYSIS, ROUTINE W REFLEX MICROSCOPIC (NOT AT [facility])  MAGNESIUM  ETHANOL  LACTIC ACID, PLASMA  LACTIC ACID, PLASMA  PROCALCITONIN  PROTIME-INR  APTT  BASIC METABOLIC PANEL  CBC  AMMONIA  RPR  HIV ANTIBODY (ROUTINE TESTING)  T4, FREE  VITAMIN B12  URINE RAPID DRUG SCREEN, HOSP PERFORMED  I-STAT TROPOININ, ED    EKG  EKG Interpretation  Date/Time:  Saturday February 14 2016 15:05:20 EDT Ventricular Rate:  59 PR Interval:    QRS Duration: 90 QT Interval:  461 QTC Calculation: 457 R Axis:   58 Text Interpretation:  Sinus rhythm No significant change was found Confirmed by CAMPOS  MD, Lennette Bihari ([id number]) on 02/14/2016 3:37:04 PM       Radiology Dg Chest 2 View  Result Date: 02/14/2016 CLINICAL DATA:  82 year old female with cramping of bilateral legs today. History of asthma. History of atrial fibrillation. Former smoker. EXAM: CHEST  2 VIEW COMPARISON:  Chest x-ray 02/11/2016. FINDINGS: No acute consolidative airspace disease. No pleural effusions. No suspicious appearing pulmonary nodules or masses. No pneumothorax. Mild cardiomegaly. No evidence of pulmonary edema. Upper mediastinal contours are within normal limits. Aortic atherosclerosis. Status post median sternotomy for CABG. IMPRESSION: 1. No radiographic evidence of acute cardiopulmonary disease. 2. Mild cardiomegaly. 3. Aortic atherosclerosis. Electronically Signed   By: Vinnie Langton M.D.   On: 02/14/2016 17:13   Ct Head Wo Contrast  Result Date: 02/14/2016 CLINICAL DATA:  Altered mental status and headache EXAM: CT HEAD WITHOUT CONTRAST TECHNIQUE: Contiguous axial images were obtained from the base of the skull through the vertex without intravenous contrast. COMPARISON:  Head CT 12/31/2015 FINDINGS: Brain: No mass lesion, intraparenchymal hemorrhage or extra-axial collection. No evidence of acute cortical infarct. There is periventricular hypoattenuation compatible with chronic  microvascular disease. There is bilateral basal ganglia mineralization. Old right cerebellar infarct. Vascular: No hyperdense vessel or unexpected calcification. Skull: Normal visualized skull base, calvarium and extracranial soft tissues. Sinuses/Orbits: No sinus fluid levels or advanced mucosal thickening. No mastoid effusion. Normal orbits. IMPRESSION: 1. No acute intracranial abnormality. 2. Unchanged remote right cerebellar infarct and findings of chronic microvascular disease. Electronically Signed   By: Ulyses Jarred M.D.   On: 02/14/2016 21:06    Procedures Procedures (including critical care time)  Medications Ordered in ED Medications  acetaminophen (TYLENOL) tablet 1,000 mg (not administered)  benzonatate (TESSALON) capsule 100 mg (not administered)  clobetasol cream (TEMOVATE) [id number] % 1 application (not administered)  fluticasone furoate-vilanterol (BREO ELLIPTA) 100-25 MCG/INH 2 puff (not administered)  acidophilus (RISAQUAD) capsule 1 capsule (not administered)  atorvastatin (LIPITOR) tablet 10 mg (not administered)  diltiazem (CARDIZEM CD) 24 hr capsule 120 mg (not administered)  folic acid (FOLVITE) tablet 1 mg (not administered)  thiamine (VITAMIN B-1) tablet 100 mg (not administered)  pantoprazole (PROTONIX) EC tablet 40 mg (not administered)  famotidine (PEPCID) tablet 40 mg (not administered)  DULoxetine (CYMBALTA) DR capsule 60 mg (not administered)  donepezil (ARICEPT) tablet 10 mg (not administered)  aspirin EC tablet 81 mg (not administered)  mirtazapine (REMERON) tablet 15 mg (not administered)  heparin injection 5,000 Units (not administered)  sodium chloride flush (NS) 0.9 % injection 3 mL (not administered)  0.9 %  sodium chloride infusion (not administered)  ondansetron (ZOFRAN) tablet 4 mg (not administered)    Or  ondansetron (ZOFRAN) injection 4 mg (not administered)  albuterol (PROVENTIL) (2.5 MG/3ML) 0.083% nebulizer solution 2.5 mg (not administered)    acetaminophen (TYLENOL) tablet 650 mg (650 mg Oral Given 02/14/16 1721)  sodium chloride 0.9 % bolus 500 mL (500 mLs  Intravenous New Bag/Given 02/14/16 2247)     Initial Impression / Assessment and Plan / ED Course  I have reviewed the triage vital signs and the nursing notes.  Pertinent labs & imaging results that were available during my care of the patient were reviewed by me and considered in my medical decision making (see chart for details).  Clinical Course   Pt with AMS, dizziness, abdominal pain and nausea. Pt was discharged from the ED yesterday for symptomatic bradycardia. Daughter states pt is more confused. Pt's BNP and WBC down from hospital stay. Elevated lactic acid. Negative neuro exam. Pt's CT head reviewed by me revealed no acute abnormalities. Hyponatremic. All labs reviewed. ETOH negative. EKG without acute changes and chest xray negative for any acute abnormalities. Pt's daughter is not sure if pt is taking her medications correctly. Do not have a clear source of infection or etiology of increased confusion at this time. Will consult the hospitalist team for admission for further evaluation.   Dr. Billy Fischer spoke with Dr. Myna Hidalgo who will admit the pt for further evaluation, treatment and will determine pt's disposition.   Thank you Dr. Myna Hidalgo for your consult, time and care of this patient.   Pt case discussed and pt seen by Dr. Billy Fischer who agrees with the above plan.   Final Clinical Impressions(s) / ED Diagnoses   Final diagnoses:  None    New Prescriptions Current Discharge Medication List       Kalman Drape, [place] 02/14/16 2335    Gareth Morgan, MD 02/18/16 0002

## 2016-02-14 NOTE — ED Notes (Signed)
Primary RN, Moss Mc, made aware of inability to get an oral temp on pt in triage.

## 2016-02-14 NOTE — ED Triage Notes (Signed)
Pt presents from home with c/o bradycardia. Family at bedside reported that she was recently seen her and admitted for bradycardia and left AMA yesterday. Family reported that the home health nurse checked her HR today and it was 50. HR is now 58 in triage.

## 2016-02-14 NOTE — ED Notes (Signed)
Pt presents a challenging IV attempt, we have had to use US guided stick to prevent multiple sticks. Please anticipate delay in blood draw.

## 2016-02-15 ENCOUNTER — Observation Stay (HOSPITAL_COMMUNITY): Payer: Medicare HMO

## 2016-02-15 DIAGNOSIS — I25119 Atherosclerotic heart disease of native coronary artery with unspecified angina pectoris: Secondary | ICD-10-CM | POA: Diagnosis not present

## 2016-02-15 DIAGNOSIS — K5790 Diverticulosis of intestine, part unspecified, without perforation or abscess without bleeding: Secondary | ICD-10-CM

## 2016-02-15 DIAGNOSIS — R7989 Other specified abnormal findings of blood chemistry: Secondary | ICD-10-CM | POA: Diagnosis not present

## 2016-02-15 DIAGNOSIS — I10 Essential (primary) hypertension: Secondary | ICD-10-CM | POA: Diagnosis not present

## 2016-02-15 DIAGNOSIS — G9340 Encephalopathy, unspecified: Secondary | ICD-10-CM | POA: Diagnosis not present

## 2016-02-15 DIAGNOSIS — R001 Bradycardia, unspecified: Secondary | ICD-10-CM | POA: Diagnosis not present

## 2016-02-15 LAB — CBC
HCT: 34.2 % — ABNORMAL LOW (ref 36.0–46.0)
Hemoglobin: 11.5 g/dL — ABNORMAL LOW (ref 12.0–15.0)
MCH: 28.7 pg (ref 26.0–34.0)
MCHC: 33.6 g/dL (ref 30.0–36.0)
MCV: 85.3 fL (ref 78.0–100.0)
Platelets: 380 10*3/uL (ref 150–400)
RBC: 4.01 MIL/uL (ref 3.87–5.11)
RDW: 14 % (ref 11.5–15.5)
WBC: 11.5 10*3/uL — ABNORMAL HIGH (ref 4.0–10.5)

## 2016-02-15 LAB — APTT: aPTT: 25 seconds (ref 24–36)

## 2016-02-15 LAB — LACTIC ACID, PLASMA
Lactic Acid, Venous: 1.1 mmol/L (ref 0.5–1.9)
Lactic Acid, Venous: 0.9 mmol/L (ref 0.5–1.9)

## 2016-02-15 LAB — RAPID URINE DRUG SCREEN, HOSP PERFORMED
Amphetamines: NOT DETECTED
Barbiturates: NOT DETECTED
Benzodiazepines: NOT DETECTED
Cocaine: NOT DETECTED
Opiates: NOT DETECTED
Tetrahydrocannabinol: NOT DETECTED

## 2016-02-15 LAB — BASIC METABOLIC PANEL
Anion gap: 9 (ref 5–15)
BUN: 25 mg/dL — ABNORMAL HIGH (ref 6–20)
CO2: 27 mmol/L (ref 22–32)
Calcium: 8.5 mg/dL — ABNORMAL LOW (ref 8.9–10.3)
Chloride: 94 mmol/L — ABNORMAL LOW (ref 101–111)
Creatinine, Ser: 1.2 mg/dL — ABNORMAL HIGH (ref 0.44–1.00)
GFR calc Af Amer: 47 mL/min — ABNORMAL LOW (ref >60)
GFR calc non Af Amer: 41 mL/min — ABNORMAL LOW (ref >60)
Glucose, Bld: 131 mg/dL — ABNORMAL HIGH (ref 65–99)
Potassium: 3.6 mmol/L (ref 3.5–5.1)
Sodium: 130 mmol/L — ABNORMAL LOW (ref 135–145)

## 2016-02-15 LAB — VITAMIN B12: Vitamin B-12: 249 pg/mL (ref 180–914)

## 2016-02-15 LAB — RPR: RPR Ser Ql: NONREACTIVE

## 2016-02-15 LAB — PROTIME-INR
INR: 0.93
Prothrombin Time: 12.5 seconds (ref 11.4–15.2)

## 2016-02-15 LAB — HIV ANTIBODY (ROUTINE TESTING W REFLEX): HIV Screen 4th Generation wRfx: NONREACTIVE

## 2016-02-15 LAB — GLUCOSE, CAPILLARY: Glucose-Capillary: 102 mg/dL — ABNORMAL HIGH (ref 65–99)

## 2016-02-15 LAB — PROCALCITONIN: Procalcitonin: <0.1 ng/mL

## 2016-02-15 LAB — AMMONIA: Ammonia: 23 umol/L (ref 9–35)

## 2016-02-15 LAB — T4, FREE: Free T4: 1.44 ng/dL — ABNORMAL HIGH (ref 0.61–1.12)

## 2016-02-15 MED ORDER — IOPAMIDOL (ISOVUE-300) INJECTION 61%
15.0000 mL | Freq: Once | INTRAVENOUS | Status: AC | PRN
  Administered 2016-02-15: 15 mL via ORAL

## 2016-02-15 MED ORDER — IOPAMIDOL (ISOVUE-300) INJECTION 61%
100.0000 mL | Freq: Once | INTRAVENOUS | Status: AC | PRN
  Administered 2016-02-15: 100 mL via INTRAVENOUS

## 2016-02-15 MED ORDER — HALOPERIDOL LACTATE 5 MG/ML IJ SOLN
2.0000 mg | Freq: Four times a day (QID) | INTRAMUSCULAR | Status: DC | PRN
  Administered 2016-02-17: 2 mg via INTRAVENOUS
  Filled 2016-02-15 (×2): qty 1
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
## 2016-02-15 NOTE — Progress Notes (Signed)
PT Cancellation Note  Patient Details Name: Tammy [name] MRN: [id number] DOB: [birth year]   Cancelled Treatment:    Reason Eval/Treat Not Completed: Patient at procedure or test/unavailable   Claretha Cooper 02/15/2016, 9:33 AM Tresa Endo PT [phone]
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
## 2016-02-16 ENCOUNTER — Inpatient Hospital Stay (HOSPITAL_COMMUNITY): Payer: Medicare HMO

## 2016-02-16 DIAGNOSIS — Z880 Allergy status to penicillin: Secondary | ICD-10-CM | POA: Diagnosis not present

## 2016-02-16 DIAGNOSIS — R7989 Other specified abnormal findings of blood chemistry: Secondary | ICD-10-CM

## 2016-02-16 DIAGNOSIS — Z8249 Family history of ischemic heart disease and other diseases of the circulatory system: Secondary | ICD-10-CM | POA: Diagnosis not present

## 2016-02-16 DIAGNOSIS — K5730 Diverticulosis of large intestine without perforation or abscess without bleeding: Secondary | ICD-10-CM | POA: Diagnosis present

## 2016-02-16 DIAGNOSIS — Z87891 Personal history of nicotine dependence: Secondary | ICD-10-CM

## 2016-02-16 DIAGNOSIS — F332 Major depressive disorder, recurrent severe without psychotic features: Secondary | ICD-10-CM

## 2016-02-16 DIAGNOSIS — F329 Major depressive disorder, single episode, unspecified: Secondary | ICD-10-CM | POA: Diagnosis present

## 2016-02-16 DIAGNOSIS — E871 Hypo-osmolality and hyponatremia: Secondary | ICD-10-CM | POA: Diagnosis present

## 2016-02-16 DIAGNOSIS — N182 Chronic kidney disease, stage 2 (mild): Secondary | ICD-10-CM | POA: Diagnosis present

## 2016-02-16 DIAGNOSIS — J449 Chronic obstructive pulmonary disease, unspecified: Secondary | ICD-10-CM | POA: Diagnosis present

## 2016-02-16 DIAGNOSIS — Z7982 Long term (current) use of aspirin: Secondary | ICD-10-CM | POA: Diagnosis not present

## 2016-02-16 DIAGNOSIS — Z91011 Allergy to milk products: Secondary | ICD-10-CM | POA: Diagnosis not present

## 2016-02-16 DIAGNOSIS — E785 Hyperlipidemia, unspecified: Secondary | ICD-10-CM | POA: Diagnosis present

## 2016-02-16 DIAGNOSIS — K5790 Diverticulosis of intestine, part unspecified, without perforation or abscess without bleeding: Secondary | ICD-10-CM | POA: Diagnosis present

## 2016-02-16 DIAGNOSIS — I10 Essential (primary) hypertension: Secondary | ICD-10-CM | POA: Diagnosis not present

## 2016-02-16 DIAGNOSIS — Z79899 Other long term (current) drug therapy: Secondary | ICD-10-CM

## 2016-02-16 DIAGNOSIS — R001 Bradycardia, unspecified: Secondary | ICD-10-CM | POA: Diagnosis present

## 2016-02-16 DIAGNOSIS — K219 Gastro-esophageal reflux disease without esophagitis: Secondary | ICD-10-CM | POA: Diagnosis present

## 2016-02-16 DIAGNOSIS — Z951 Presence of aortocoronary bypass graft: Secondary | ICD-10-CM | POA: Diagnosis not present

## 2016-02-16 DIAGNOSIS — I509 Heart failure, unspecified: Secondary | ICD-10-CM | POA: Diagnosis present

## 2016-02-16 DIAGNOSIS — I25119 Atherosclerotic heart disease of native coronary artery with unspecified angina pectoris: Secondary | ICD-10-CM | POA: Diagnosis present

## 2016-02-16 DIAGNOSIS — I480 Paroxysmal atrial fibrillation: Secondary | ICD-10-CM | POA: Diagnosis present

## 2016-02-16 DIAGNOSIS — I129 Hypertensive chronic kidney disease with stage 1 through stage 4 chronic kidney disease, or unspecified chronic kidney disease: Secondary | ICD-10-CM | POA: Diagnosis present

## 2016-02-16 DIAGNOSIS — N179 Acute kidney failure, unspecified: Secondary | ICD-10-CM | POA: Diagnosis present

## 2016-02-16 DIAGNOSIS — G9340 Encephalopathy, unspecified: Secondary | ICD-10-CM | POA: Diagnosis present

## 2016-02-16 DIAGNOSIS — K589 Irritable bowel syndrome without diarrhea: Secondary | ICD-10-CM | POA: Diagnosis present

## 2016-02-16 DIAGNOSIS — F05 Delirium due to known physiological condition: Secondary | ICD-10-CM | POA: Diagnosis not present

## 2016-02-16 DIAGNOSIS — E860 Dehydration: Secondary | ICD-10-CM | POA: Diagnosis present

## 2016-02-16 LAB — URINE CULTURE

## 2016-02-16 LAB — BASIC METABOLIC PANEL
Anion gap: 8 (ref 5–15)
BUN: 21 mg/dL — ABNORMAL HIGH (ref 6–20)
CO2: 27 mmol/L (ref 22–32)
Calcium: 8.8 mg/dL — ABNORMAL LOW (ref 8.9–10.3)
Chloride: 98 mmol/L — ABNORMAL LOW (ref 101–111)
Creatinine, Ser: 1.21 mg/dL — ABNORMAL HIGH (ref 0.44–1.00)
GFR calc Af Amer: 47 mL/min — ABNORMAL LOW (ref >60)
GFR calc non Af Amer: 40 mL/min — ABNORMAL LOW (ref >60)
Glucose, Bld: 143 mg/dL — ABNORMAL HIGH (ref 65–99)
Potassium: 3.8 mmol/L (ref 3.5–5.1)
Sodium: 133 mmol/L — ABNORMAL LOW (ref 135–145)

## 2016-02-16 LAB — GLUCOSE, CAPILLARY: Glucose-Capillary: 108 mg/dL — ABNORMAL HIGH (ref 65–99)

## 2016-02-16 MED ORDER — FAMOTIDINE 20 MG PO TABS
20.0000 mg | ORAL_TABLET | Freq: Every day | ORAL | Status: DC
  Administered 2016-02-16: 20 mg via ORAL
  Filled 2016-02-16: qty 1

## 2016-02-16 MED ORDER — SODIUM CHLORIDE 0.9 % IV SOLN
INTRAVENOUS | Status: DC
  Administered 2016-02-16 – 2016-02-17 (×2): via INTRAVENOUS

## 2016-02-16 NOTE — Care Management Note (Signed)
Case Management Note  Patient Details  Name: Tammy [name] MRN: [id number] Date of Birth: [birth year]  Subjective/Objective:  Spoke to dtr-[name] [phone]-about d/c plans-she is in agreement to returning back home w/HHC-alrady active w/[facility]-await HHRN/PT/OT/social work-resources to fax to [facility] agency.PT-recc-HHPT.Dtr will pick patient up when ready for d/c.Psych following.                  Action/Plan:d/c home w/HHC.   Expected Discharge Date:                  Expected Discharge Plan:  [place]  In-House Referral:  Clinical Social Work  Discharge planning Services  CM Consult  Post Acute Care Choice:  [place] (Active w/[facility]HC-RN,PT,OT,CSW) Choice offered to:     DME Arranged:    DME Agency:     HH Arranged:    HH Agency:     Status of Service:  In process, will continue to follow  If discussed at Long Length of Stay Meetings, dates discussed:    Additional Comments:  Dessa Phi, RN 02/16/2016, 1:38 PM

## 2016-02-16 NOTE — Care Management Obs Status (Signed)
[facility] NOTIFICATION   Patient Details  Name: RELIA KEILLOR MRN: [id number] Date of Birth: 16-Jun-1930   Medicare Observation Status Notification Given:  Yes    MahabirJuliann Pulse, RN 02/16/2016, 1:41 PM

## 2016-02-16 NOTE — Progress Notes (Signed)
PROGRESS NOTE    [name]  [id number] DOB: Oct 16, 1930 DOA: 02/14/2016 PCP:  Melinda Crutch, MD    Brief Narrative: Tammy [name] is a 82 y.o. female with medical history significant for coronary artery disease, hypertension, paroxysmal atrial fibrillation, GERD, and memory loss who presents the emergency department for evaluation of lightheadedness upon standing, nausea, lower abdominal pain, and confusion. She was recently admitted on 9/28 and discharged on 9/29, readmitted on 9/30 for the above symptoms. Daughter is thePOA  And is very concerned of her safety. She reports, pt was found urinating and defecated all over her bedroom and was roaming naked in the house on multiple occasions.    Assessment & Plan:   Principal Problem:   Acute encephalopathy Active Problems:   Coronary artery disease involving native coronary artery of native heart with angina pectoris (HCC)   Essential hypertension   Memory loss   PAF (paroxysmal atrial fibrillation) (HCC)   Bradycardia on ECG   Leukocytosis   Depression   Hyponatremia   Elevated lactic acid level   Dizziness   Lower abdominal pain   Diverticulosis   Encephalopathy   Acute encephalopathy probably worsening of her dementia vs paranoia vs metabolic encephalopathy from acute renal failure.  Admitted for further evaluation.  MRI brain ordered, and psychiatry consulted.  Haldol ordered for delirium and agitation.  Psychiatry deemed she is currently competent to make medical decisions and decisions regardingliving situation.  Currently in restraints, we tried to take off the restraints, but she pulled out the IV and she had to be put on restraints.     Acute renal failure on Stage 2 CKD: - probably from dehydration.  - gentle hydration, shows improvement in creatinine.  - hold ACE and nephrotoxic agents.   H/o depression: Resume cymbalta.   Bradycardia: Probably from BB.  ON TELEMETRY. Resolved.    Paroxysmal atrial  fibrillation: Rate is around 90's.  Will restart on low dose metoprolol if HR >100 /min.   CAD:  No chest pain.   Elevated lactic acid: Probably from dehydration, improved with hydration.    DVT prophylaxis: (Lovenox/) Code Status: (Full) Family Communication: discussed with daughter on the phone on 9/30 Disposition Plan: pending further eval.    Consultants:   Psychiatry.    Procedures: none   Antimicrobials: none   Subjective: Calm but confused.   Objective: Vitals:   02/16/16 0529 02/16/16 0803 02/16/16 1005 02/16/16 1328  BP: 139/78  (!) 133/52 (!) 134/46  Pulse: 73   90  Resp: 20   18  Temp: 97.6 F (36.4 C)   97.8 F (36.6 C)  TempSrc: Oral   Oral  SpO2: 96% 96%  97%  Weight:      Height:        Intake/Output Summary (Last 24 hours) at 02/16/16 1713 Last data filed at 02/16/16 1004  Gross per 24 hour  Intake              460 ml  Output              100 ml  Net              360 ml   Filed Weights   02/14/16 1451 02/14/16 2300 02/16/16 0500  Weight: 59 kg (130 lb) 56.6 kg (124 lb 12.8 oz) 57.4 kg (126 lb 8 oz)    Examination:  General exam: confused.   Respiratory system: Clear to auscultation. Respiratory effort normal. Cardiovascular system: S1 &  S2 heard, RRR. No JVD, murmurs, rubs, gallops or clicks. No pedal edema. Gastrointestinal system: Abdomen is nondistended, soft and nontender. No organomegaly or masses felt. Normal bowel sounds heard. Central nervous system: Alert and confused.  Extremities: Symmetric 5 x 5 power. Skin: No rashes, lesions or ulcers     Data Reviewed: I have personally reviewed following labs and imaging studies  CBC:  Recent Labs Lab 02/11/16 2106 02/14/16 1727 02/15/16 0157  WBC 17.3* 14.0* 11.5*  NEUTROABS  --  11.0*  --   HGB 12.1 13.5 11.5*  HCT 37.3 40.3 34.2*  MCV 86.9 85.7 85.3  PLT 337 434* [id number]   Basic Metabolic Panel:  Recent Labs Lab 02/11/16 2106 02/12/16 0801 02/14/16 1727  02/15/16 0157 02/16/16 0905  NA 134* 138 132* 130* 133*  K 3.9 3.8 4.0 3.6 3.8  CL 104 110 95* 94* 98*  CO2 20* 21* 23 27 27   GLUCOSE 142* 114* 108* 131* 143*  BUN 38* 25* 19 25* 21*  CREATININE 1.74* 1.11* 1.05* 1.20* 1.21*  CALCIUM 9.0 8.7* 9.6 8.5* 8.8*  MG  --   --  1.7  --   --    GFR: Estimated Creatinine Clearance: 28 mL/min (by C-G formula based on SCr of 1.21 mg/dL (H)). Liver Function Tests:  Recent Labs Lab 02/14/16 1727  AST 26  ALT 19  ALKPHOS 90  BILITOT 0.6  PROT 8.5*  ALBUMIN 4.5   No results for input(s): LIPASE, AMYLASE in the last 168 hours.  Recent Labs Lab 02/15/16 0157  AMMONIA 23   Coagulation Profile:  Recent Labs Lab 02/15/16 0157  INR 0.93   Cardiac Enzymes:  Recent Labs Lab 02/12/16 0801 02/12/16 1352  TROPONINI <0.03 <0.03   BNP (last 3 results) No results for input(s): PROBNP in the last 8760 hours. HbA1C: No results for input(s): HGBA1C in the last 72 hours. CBG:  Recent Labs Lab 02/11/16 2036 02/12/16 0743 02/15/16 0809 02/16/16 0809  GLUCAP 147* 120* 102* 108*   Lipid Profile: No results for input(s): CHOL, HDL, LDLCALC, TRIG, CHOLHDL, LDLDIRECT in the last 72 hours. Thyroid Function Tests:  Recent Labs  02/15/16 0157  FREET4 1.44*   Anemia Panel:  Recent Labs  02/15/16 0157  VITAMINB12 249   Sepsis Labs:  Recent Labs Lab 02/14/16 1820 02/15/16 0157 02/15/16 0434  PROCALCITON  --  <0.10  --   LATICACIDVEN 2.86* 1.1 0.9    Recent Results (from the past 240 hour(s))  Urine culture     Status: Abnormal   Collection Time: 02/14/16  4:32 PM  Result Value Ref Range Status   Specimen Description URINE, CLEAN CATCH  Final   Special Requests NONE  Final   Culture MULTIPLE SPECIES PRESENT, SUGGEST RECOLLECTION (A)  Final   Report Status 02/16/2016 FINAL  Final  Culture, blood (x 2)     Status: None (Preliminary result)   Collection Time: 02/15/16  1:43 AM  Result Value Ref Range Status    Specimen Description BLOOD LEFT ARM  Final   Special Requests IN PEDIATRIC BOTTLE 2CC  Final   Culture   Final    NO GROWTH 1 DAY Performed at [facility]    Report Status PENDING  Incomplete  Culture, blood (x 2)     Status: None (Preliminary result)   Collection Time: 02/15/16  1:48 AM  Result Value Ref Range Status   Specimen Description BLOOD LEFT ARM  Final   Special Requests BOTTLES DRAWN AEROBIC ONLY  5 CC  Final   Culture   Final    NO GROWTH 1 DAY Performed at [facility]    Report Status PENDING  Incomplete         Radiology Studies: Ct Head Wo Contrast  Result Date: 02/14/2016 CLINICAL DATA:  Altered mental status and headache EXAM: CT HEAD WITHOUT CONTRAST TECHNIQUE: Contiguous axial images were obtained from the base of the skull through the vertex without intravenous contrast. COMPARISON:  Head CT 12/31/2015 FINDINGS: Brain: No mass lesion, intraparenchymal hemorrhage or extra-axial collection. No evidence of acute cortical infarct. There is periventricular hypoattenuation compatible with chronic microvascular disease. There is bilateral basal ganglia mineralization. Old right cerebellar infarct. Vascular: No hyperdense vessel or unexpected calcification. Skull: Normal visualized skull base, calvarium and extracranial soft tissues. Sinuses/Orbits: No sinus fluid levels or advanced mucosal thickening. No mastoid effusion. Normal orbits. IMPRESSION: 1. No acute intracranial abnormality. 2. Unchanged remote right cerebellar infarct and findings of chronic microvascular disease. Electronically Signed   By: Ulyses Jarred M.D.   On: 02/14/2016 21:06   Ct Abdomen Pelvis W Contrast  Result Date: 02/15/2016 CLINICAL DATA:  Confused and combative this morning with lower abdominal tenderness, left greater than right. EXAM: CT ABDOMEN AND PELVIS WITH CONTRAST TECHNIQUE: Multidetector CT imaging of the abdomen and pelvis was performed using the standard protocol  following bolus administration of intravenous contrast. CONTRAST:  ISOVUE-300 IOPAMIDOL (ISOVUE-300) INJECTION 61% COMPARISON:  CT abdomen pelvis - 01/24/2013; lumbar spine MRI - 01/18/2013 FINDINGS: Lower chest: Limited visualization of lower thorax demonstrates minimal dependent subpleural ground-glass atelectasis. No focal airspace opacities. No pleural effusion. Normal heart size.  No pericardial effusion. Hepatobiliary: Normal hepatic contour. There is mild diffuse decreased attenuation of the hepatic parenchyma on this postcontrast examination suggestive of hepatic steatosis. No discrete hepatic lesions. There are several laminated gallstones within the neck of an otherwise normal-appearing gallstone bladder with dominant stone measuring approximately 1.2 cm in diameter (coronal image 62, series 5). No gallbladder wall thickening or pericholecystic fluid. Normal caliber the common bile duct for age. No intra extrahepatic bili duct dilatation. No ascites. Pancreas: Normal appearance of the pancreas. No peripancreatic stranding. Spleen: Normal appearance of the spleen. Adrenals/Urinary Tract: There is symmetric enhancement and excretion of the bilateral kidneys. No definite renal stones on this postcontrast examination. Sub cm left-sided renal lesion is too small to adequately characterize of favored to represent a renal cyst. No discrete right-sided renal lesions. No urinary obstruction or perinephric stranding. Normal appearance of the urinary bladder given degree distention. Normal appearance of the bilateral adrenal glands. Stomach/Bowel: Rather extensive colonic diverticulosis without evidence of superimposed acute diverticulitis. Normal appearance of the terminal ileum and retrocecal appendix. Enteric contrast extends to the level of the cecum. No evidence of enteric obstruction. No pneumoperitoneum, pneumatosis or portal venous gas. Vascular/Lymphatic: Moderate amount of eccentric mixed calcified and  noncalcified atherosclerotic plaque within a normal caliber abdominal aorta. The major branch vessels of the abdominal aorta appear patent on this non CTA examination. No bulky retroperitoneal, mesenteric, pelvic or inguinal lymphadenopathy. Reproductive: Normal appearance of the pelvic organs for age. No free fluid in the pelvic cul-de-sac. Other: Ill-defined stranding and scattered foci of subcutaneous emphysema about the anterior aspect of the lower abdominal pannus likely at the location of prior subcutaneous medication administration. Musculoskeletal: No acute or aggressive osseous abnormalities. 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 Moderate severe multilevel lumbar spine DDD, worse at L2-L3 and L3-L4 with disc space height loss, endplate irregularity and  sclerosis. Increased sclerosis involving the left femoral head may represent the sequela of early avascular necrosis. No evidence of articular surface collapse. IMPRESSION: 1. No acute findings within the abdomen or pelvis. 2. Extensive colonic diverticulosis without evidence of diverticulitis. 3. Cholelithiasis without evidence of cholecystitis. 4.  Aortic Atherosclerosis (ICD10-170.0) Electronically Signed   By: Sandi Mariscal M.D.   On: 02/15/2016 08:58        Scheduled Meds: . acidophilus  1 capsule Oral Daily  . aspirin EC  81 mg Oral Daily  . atorvastatin  10 mg Oral QHS  . diltiazem  120 mg Oral Daily  . donepezil  10 mg Oral Daily  . DULoxetine  60 mg Oral Daily  . famotidine  20 mg Oral QHS  . fluticasone furoate-vilanterol  2 puff Inhalation BID  . folic acid  1 mg Oral Daily  . heparin  5,000 Units Subcutaneous Q8H  . mirtazapine  15 mg Oral QHS  . pantoprazole  40 mg Oral Daily  . sodium chloride flush  3 mL Intravenous Q12H  . thiamine  100 mg Oral Daily   Continuous Infusions: . sodium chloride 75 mL/hr at 02/16/16 1352     LOS: 0 days    Time spent: 30 minutes.     Hosie Poisson, MD [facility] Pager [phone]  If 7PM-7AM, please contact night-coverage [email or web address] Password [email or web address] 02/16/2016, 5:13 PM

## 2016-02-16 NOTE — Care Management Note (Signed)
Case Management Note  Patient Details  Name: THANDIWE SAMARAS MRN: [id number] Date of Birth: December 27, 1930  Subjective/Objective:  82 y/o f admitted w/Acute encephalopathy. [id number].From home alone. dtr-caregiver. Active [facility]-RN,PT/OT/csw-spoke to rep-Kendra [phone];fax#[phone]-will fax [facility] orders when available. PT-recc-HHPT. Psych cons-for capacity. Psych csw following.                  Action/Plan:d/c plan home w/HHC.   Expected Discharge Date:                  Expected Discharge Plan:  [place]  In-House Referral:  Clinical Social Work  Discharge planning Services  CM Consult  Post Acute Care Choice:  [place] (Active w/[facility]-RN,PT,OT,CSW) Choice offered to:     DME Arranged:    DME Agency:     HH Arranged:    HH Agency:     Status of Service:  In process, will continue to follow  If discussed at Long Length of Stay Meetings, dates discussed:    Additional Comments:  Dessa Phi, RN 02/16/2016, 12:19 PM

## 2016-02-16 NOTE — Consult Note (Signed)
[facility] Consult   Reason for Consult:  AMS and capacity evaluation Referring Physician:  Dr. Karleen Hampshire Patient Identification: Tammy [name] MRN:  [id number] Principal Diagnosis: Acute encephalopathy Diagnosis:   Patient Active Problem List   Diagnosis Date Noted  . Diverticulosis [K57.90]   . Encephalopathy [G93.40]   . Hyponatremia [E87.1] 02/14/2016  . Acute encephalopathy [G93.40] 02/14/2016  . Elevated lactic acid level [R79.89] 02/14/2016  . Dizziness [R42] 02/14/2016  . Lower abdominal pain [R10.30] 02/14/2016  . Symptomatic bradycardia [R00.1] 02/12/2016  . Leukocytosis [D72.829] 02/12/2016  . Depression [F32.9] 02/12/2016  . Renal insufficiency [N28.9]   . SOB (shortness of breath) [R06.02]   . Bradycardia on ECG [R00.1] 02/11/2016  . Atrial fibrillation with RVR ([place]) [I48.91] 01/01/2016  . PAF (paroxysmal atrial fibrillation) ([place]) [I48.0] 01/01/2016  . Alcohol dependence with withdrawal with complication ([place]) [G25.638] 01/01/2016  . Fall [W19.XXXA] 01/01/2016  . AKI (acute kidney injury) ([place]) [N17.9]   . Dehydration [E86.0]   . Exertional dyspnea [R06.09] 09/11/2015  . Memory loss [R41.3] 11/13/2013  . Abnormal involuntary movements(781.0) [R25.9] 11/13/2013  . Pulmonary hypertension [I27.20] 11/02/2013  . Fatigue [R53.83] 11/02/2013  . Coronary artery disease involving native coronary artery of native heart with angina pectoris ([place]) [I25.119] 11/16/2012  . Mitral regurgitation [I34.0] 11/16/2012  . Renal artery stenosis ([place]) [I70.1] 11/16/2012  . Hyperlipidemia with target LDL less than 70 [E78.5] 11/16/2012  . Essential hypertension [I10] 11/16/2012    Total Time spent with patient: 1 hour  Subjective:   Tammy [name] is a 82 y.o. female patient admitted with AMS.  HPI:  Tammy [name] is a 82 y.o. female with medical history significant for coronary artery disease, hypertension, paroxysmal atrial fibrillation, GERD, and memory loss who  presents the emergency department for evaluation of lightheadedness upon standing, nausea, lower abdominal pain, and confusion. Psychiatric consultation requested for behavior problems and also capacity evaluation. Patient appeared staying in her bed with the soft restraints to her both upper extremities. Patient reported staff nurses are mean to her and she also endorses being mean back to the staff nurses yesterday.. Patient reported she cannot withstand anybody mean to her without giving the attitude. Patient endorses she came to the hospital because of stomach upset but could not find any triggers and stays to this provider "You are doctor And You find it out". Patient then stated that she has been living by herself, able to drive around the town and they her own place and her daughter has been busy working on the time. Patient has fine cognitions including orientation, memory, language functions and fund of knowledge. Patient reported she woke and as it plan to manage her before retired  In [employer]. Patient denied  Her minimizes being incontinent of stool and soiling around the house.Patient has been under investigation for altered mental status at this time.Patient denied previous history of psychiatric illness, acute psychiatric hospitalization or outpatient medication management.Will ask LCSW  To contact patient daughter  For collateral information.  Past Psychiatric History: Patient has a history ofdepression and was treated with cymbaltaBy primary care physician.   Risk to Self: Is patient at risk for suicide?: No Risk to Others:   Prior Inpatient Therapy:   Prior Outpatient Therapy:    Past Medical History:  Past Medical History:  Diagnosis Date  . Acid reflux   . Asthma    Asthmatic COPD  . Atrial fibrillation ([place]) 01/01/2016  . Coronary artery disease    Echo  07/27/2011   . Coronary artery stenosis    , High-grade left main  . GERD (gastroesophageal reflux disease)   .  Hyperlipidemia   . Hypertension   . IBS (irritable bowel syndrome)   . Psoriasis     Past Surgical History:  Procedure Laterality Date  . CARDIAC SURGERY    . CORONARY ARTERY BYPASS GRAFT  1994   After catheterization showed 90% ostial left main stenosis  . DOPPLER ECHOCARDIOGRAPHY     Study showed mild to moderate MR with moderate TR, mild to moderate pulmonary hypertension with an ejection fraction greater that 55%.   [name] EYE SURGERY    . Nuclear Perfusion  10/2010, 11/15/2012   Showed normal perfusion, No Ischemia or Infarction, Normal EF  . Renal Scan Duplex  04/09/2012   Showing greater than 50% diameter reduction in the celiac artery and SMA.. Right proximal 275 and Mid 152.  [name] ROTATOR CUFF REPAIR     bilateral  . Sleep Study  12/28/2006   AHI during total sleep time (3h 19 minutes) was 1.81/hr and during REM sleep at 17.78/hr. Mild sleep apnea during REM sleep.Oxygen staturated rate during REM and NREM was 93.0%.   Family History:  Family History  Problem Relation Age of Onset  . Heart disease Mother   . Hypertension Sister    Family Psychiatric  History: patient denied family history of mental illness. Social History:  History  Alcohol Use  . 0.0 oz/week    Comment: drinks wine/beer daily     History  Drug Use No    Social History   Social History  . Marital status: Single    Spouse name: N/A  . Number of children: N/A  . Years of education: N/A   Social History Main Topics  . Smoking status: Former Smoker    Quit date: 11/08/1962  . Smokeless tobacco: Never Used  . Alcohol use 0.0 oz/week     Comment: drinks wine/beer daily  . Drug use: No  . Sexual activity: No   Other Topics Concern  . None   Social History Narrative   Patient lives at home alone.    Patient is divorced.    Patient has one child.    Patient has a high school education.    Patient quit tobacco 40 years ago.    Patient drinks coffee and tea daily.    Additional Social History:     Allergies:   Allergies  Allergen Reactions  . Penicillins Hives    Has patient had a PCN reaction causing immediate rash, facial/tongue/throat swelling, SOB or lightheadedness with hypotension: No Has patient had a PCN reaction causing severe rash involving mucus membranes or skin necrosis: Yes Has patient had a PCN reaction that required hospitalization No Has patient had a PCN reaction occurring within the last 10 years: Yes took again last year 2016 If all of the above answers are "NO", then may proceed with Cephalosporin use.   . Lactose Intolerance (Gi)     Unknown- [facility] documented    Labs:  Results for orders placed or performed during the hospital encounter of 02/14/16 (from the past 48 hour(s))  Urinalysis, Routine w reflex microscopic     Status: None   Collection Time: 02/14/16  4:32 PM  Result Value Ref Range   Color, Urine YELLOW YELLOW   APPearance CLEAR CLEAR   Specific Gravity, Urine 1.009 1.005 - 1.030   pH 5.5 5.0 - 8.0  Glucose, UA NEGATIVE NEGATIVE mg/dL   Hgb urine dipstick NEGATIVE NEGATIVE   Bilirubin Urine NEGATIVE NEGATIVE   Ketones, ur NEGATIVE NEGATIVE mg/dL   Protein, ur NEGATIVE NEGATIVE mg/dL   Nitrite NEGATIVE NEGATIVE   Leukocytes, UA NEGATIVE NEGATIVE    Comment: MICROSCOPIC NOT DONE ON URINES WITH NEGATIVE PROTEIN, BLOOD, LEUKOCYTES, NITRITE, OR GLUCOSE <1000 mg/dL.  Urine culture     Status: Abnormal   Collection Time: 02/14/16  4:32 PM  Result Value Ref Range   Specimen Description URINE, CLEAN CATCH    Special Requests NONE    Culture MULTIPLE SPECIES PRESENT, SUGGEST RECOLLECTION (A)    Report Status 02/16/2016 FINAL   Urine rapid drug screen (hosp performed)     Status: None   Collection Time: 02/14/16  4:32 PM  Result Value Ref Range   Opiates NONE DETECTED NONE DETECTED   Cocaine NONE DETECTED NONE DETECTED   Benzodiazepines NONE DETECTED NONE DETECTED   Amphetamines NONE DETECTED NONE DETECTED    Tetrahydrocannabinol NONE DETECTED NONE DETECTED   Barbiturates NONE DETECTED NONE DETECTED    Comment:        DRUG SCREEN FOR MEDICAL PURPOSES ONLY.  IF CONFIRMATION IS NEEDED FOR ANY PURPOSE, NOTIFY LAB WITHIN 5 DAYS.        LOWEST DETECTABLE LIMITS FOR URINE DRUG SCREEN Drug Class       Cutoff (ng/mL) Amphetamine      1000 Barbiturate      200 Benzodiazepine   428 Tricyclics       768 Opiates          300 Cocaine          300 THC              50   Comprehensive metabolic panel     Status: Abnormal   Collection Time: 02/14/16  5:27 PM  Result Value Ref Range   Sodium 132 (L) 135 - 145 mmol/L   Potassium 4.0 3.5 - 5.1 mmol/L   Chloride 95 (L) 101 - 111 mmol/L   CO2 23 22 - 32 mmol/L   Glucose, Bld 108 (H) 65 - 99 mg/dL   BUN 19 6 - 20 mg/dL   Creatinine, Ser 1.05 (H) 0.44 - 1.00 mg/dL   Calcium 9.6 8.9 - 10.3 mg/dL   Total Protein 8.5 (H) 6.5 - 8.1 g/dL   Albumin 4.5 3.5 - 5.0 g/dL   AST 26 15 - 41 U/L   ALT 19 14 - 54 U/L   Alkaline Phosphatase 90 38 - 126 U/L   Total Bilirubin 0.6 0.3 - 1.2 mg/dL   GFR calc non Af Amer 48 (L) >60 mL/min   GFR calc Af Amer 55 (L) >60 mL/min    Comment: (NOTE) The eGFR has been calculated using the CKD EPI equation. This calculation has not been validated in all clinical situations. eGFR's persistently <60 mL/min signify possible Chronic Kidney Disease.    Anion gap 14 5 - 15  CBC with Differential     Status: Abnormal   Collection Time: 02/14/16  5:27 PM  Result Value Ref Range   WBC 14.0 (H) 4.0 - 10.5 K/uL   RBC 4.70 3.87 - 5.11 MIL/uL   Hemoglobin 13.5 12.0 - 15.0 g/dL   HCT 40.3 36.0 - 46.0 %   MCV 85.7 78.0 - 100.0 fL   MCH 28.7 26.0 - 34.0 pg   MCHC 33.5 30.0 - 36.0 g/dL   RDW 14.0 11.5 - 15.5 %  Platelets 434 (H) 150 - 400 K/uL   Neutrophils Relative % 79 %   Neutro Abs 11.0 (H) 1.7 - 7.7 K/uL   Lymphocytes Relative 15 %   Lymphs Abs 2.1 0.7 - 4.0 K/uL   Monocytes Relative 6 %   Monocytes Absolute 0.8 0.1 - 1.0  K/uL   Eosinophils Relative 0 %   Eosinophils Absolute 0.0 0.0 - 0.7 K/uL   Basophils Relative 0 %   Basophils Absolute 0.0 0.0 - 0.1 K/uL  Brain natriuretic peptide     Status: Abnormal   Collection Time: 02/14/16  5:27 PM  Result Value Ref Range   B Natriuretic Peptide 166.4 (H) 0.0 - 100.0 pg/mL  Magnesium     Status: None   Collection Time: 02/14/16  5:27 PM  Result Value Ref Range   Magnesium 1.7 1.7 - 2.4 mg/dL  I-stat troponin, ED     Status: None   Collection Time: 02/14/16  6:19 PM  Result Value Ref Range   Troponin i, poc 0.01 0.00 - 0.08 ng/mL   Comment 3            Comment: Due to the release kinetics of cTnI, a negative result within the first hours of the onset of symptoms does not rule out myocardial infarction with certainty. If myocardial infarction is still suspected, repeat the test at appropriate intervals.   I-Stat CG4 Lactic Acid, ED     Status: Abnormal   Collection Time: 02/14/16  6:20 PM  Result Value Ref Range   Lactic Acid, Venous 2.86 (HH) 0.5 - 1.9 mmol/L   Comment NOTIFIED PHYSICIAN   Ethanol     Status: None   Collection Time: 02/14/16  7:17 PM  Result Value Ref Range   Alcohol, Ethyl (B) <5 <5 mg/dL    Comment:        LOWEST DETECTABLE LIMIT FOR SERUM ALCOHOL IS 5 mg/dL FOR MEDICAL PURPOSES ONLY   Lactic acid, plasma     Status: None   Collection Time: 02/15/16  1:57 AM  Result Value Ref Range   Lactic Acid, Venous 1.1 0.5 - 1.9 mmol/L  Procalcitonin     Status: None   Collection Time: 02/15/16  1:57 AM  Result Value Ref Range   Procalcitonin <0.10 ng/mL    Comment:        Interpretation: PCT (Procalcitonin) <= 0.5 ng/mL: Systemic infection (sepsis) is not likely. Local bacterial infection is possible. (NOTE)         ICU PCT Algorithm               Non ICU PCT Algorithm    ----------------------------     ------------------------------         PCT < 0.25 ng/mL                 PCT < 0.1 ng/mL     Stopping of antibiotics             Stopping of antibiotics       strongly encouraged.               strongly encouraged.    ----------------------------     ------------------------------       PCT level decrease by               PCT < 0.25 ng/mL       >= 80% from peak PCT       OR PCT 0.25 - 0.5 ng/mL  Stopping of antibiotics                                             encouraged.     Stopping of antibiotics           encouraged.    ----------------------------     ------------------------------       PCT level decrease by              PCT >= 0.25 ng/mL       < 80% from peak PCT        AND PCT >= 0.5 ng/mL            Continuin g antibiotics                                              encouraged.       Continuing antibiotics            encouraged.    ----------------------------     ------------------------------     PCT level increase compared          PCT > 0.5 ng/mL         with peak PCT AND          PCT >= 0.5 ng/mL             Escalation of antibiotics                                          strongly encouraged.      Escalation of antibiotics        strongly encouraged.   Protime-INR     Status: None   Collection Time: 02/15/16  1:57 AM  Result Value Ref Range   Prothrombin Time 12.5 11.4 - 15.2 seconds   INR 0.93   APTT     Status: None   Collection Time: 02/15/16  1:57 AM  Result Value Ref Range   aPTT 25 24 - 36 seconds  Basic metabolic panel     Status: Abnormal   Collection Time: 02/15/16  1:57 AM  Result Value Ref Range   Sodium 130 (L) 135 - 145 mmol/L   Potassium 3.6 3.5 - 5.1 mmol/L   Chloride 94 (L) 101 - 111 mmol/L   CO2 27 22 - 32 mmol/L   Glucose, Bld 131 (H) 65 - 99 mg/dL   BUN 25 (H) 6 - 20 mg/dL   Creatinine, Ser 1.20 (H) 0.44 - 1.00 mg/dL   Calcium 8.5 (L) 8.9 - 10.3 mg/dL   GFR calc non Af Amer 41 (L) >60 mL/min   GFR calc Af Amer 47 (L) >60 mL/min    Comment: (NOTE) The eGFR has been calculated using the CKD EPI equation. This calculation has not been validated in all  clinical situations. eGFR's persistently <60 mL/min signify possible Chronic Kidney Disease.    Anion gap 9 5 - 15  CBC     Status: Abnormal   Collection Time: 02/15/16  1:57 AM  Result Value Ref Range   WBC 11.5 (H) 4.0 - 10.5 K/uL   RBC 4.01 3.87 - 5.11 MIL/uL  Hemoglobin 11.5 (L) 12.0 - 15.0 g/dL   HCT 34.2 (L) 36.0 - 46.0 %   MCV 85.3 78.0 - 100.0 fL   MCH 28.7 26.0 - 34.0 pg   MCHC 33.6 30.0 - 36.0 g/dL   RDW 14.0 11.5 - 15.5 %   Platelets 380 150 - 400 K/uL  Ammonia     Status: None   Collection Time: 02/15/16  1:57 AM  Result Value Ref Range   Ammonia 23 9 - 35 umol/L  RPR     Status: None   Collection Time: 02/15/16  1:57 AM  Result Value Ref Range   RPR Ser Ql Non Reactive Non Reactive    Comment: (NOTE) Performed At: [facility] [address] [place], [place] [id number] Lindon Romp MD [id number]   HIV antibody     Status: None   Collection Time: 02/15/16  1:57 AM  Result Value Ref Range   HIV Screen 4th Generation wRfx Non Reactive Non Reactive    Comment: (NOTE) Performed At: [facility] [place], [place] [id number] Lindon Romp MD [id number]   T4, free     Status: Abnormal   Collection Time: 02/15/16  1:57 AM  Result Value Ref Range   Free T4 1.44 (H) 0.61 - 1.12 ng/dL    Comment: (NOTE) Biotin ingestion may interfere with free T4 tests. If the results are inconsistent with the TSH level, previous test results, or the clinical presentation, then consider biotin interference. If needed, order repeat testing after stopping biotin. Performed at [facility]   Vitamin B12     Status: None   Collection Time: 02/15/16  1:57 AM  Result Value Ref Range   Vitamin B-12 249 180 - 914 pg/mL    Comment: (NOTE) This assay is not validated for testing neonatal or myeloproliferative syndrome specimens for Vitamin B12 levels. Performed at [facility]   Lactic acid, plasma     Status: None    Collection Time: 02/15/16  4:34 AM  Result Value Ref Range   Lactic Acid, Venous 0.9 0.5 - 1.9 mmol/L  Glucose, capillary     Status: Abnormal   Collection Time: 02/15/16  8:09 AM  Result Value Ref Range   Glucose-Capillary 102 (H) 65 - 99 mg/dL  Glucose, capillary     Status: Abnormal   Collection Time: 02/16/16  8:09 AM  Result Value Ref Range   Glucose-Capillary 108 (H) 65 - 99 mg/dL  Basic metabolic panel     Status: Abnormal   Collection Time: 02/16/16  9:05 AM  Result Value Ref Range   Sodium 133 (L) 135 - 145 mmol/L   Potassium 3.8 3.5 - 5.1 mmol/L   Chloride 98 (L) 101 - 111 mmol/L   CO2 27 22 - 32 mmol/L   Glucose, Bld 143 (H) 65 - 99 mg/dL   BUN 21 (H) 6 - 20 mg/dL   Creatinine, Ser 1.21 (H) 0.44 - 1.00 mg/dL   Calcium 8.8 (L) 8.9 - 10.3 mg/dL   GFR calc non Af Amer 40 (L) >60 mL/min   GFR calc Af Amer 47 (L) >60 mL/min    Comment: (NOTE) The eGFR has been calculated using the CKD EPI equation. This calculation has not been validated in all clinical situations. eGFR's persistently <60 mL/min signify possible Chronic Kidney Disease.    Anion gap 8 5 - 15    Current Facility-Administered Medications  Medication Dose Route Frequency Provider Last Rate Last Dose  .  0.9 %  sodium chloride infusion   Intravenous Continuous Hosie Poisson, MD      . acetaminophen (TYLENOL) tablet 1,000 mg  1,000 mg Oral Q8H PRN Vianne Bulls, MD   1,000 mg at 02/16/16 0558  . acidophilus (RISAQUAD) capsule 1 capsule  1 capsule Oral Daily Vianne Bulls, MD   1 capsule at 02/16/16 1005  . albuterol (PROVENTIL) (2.5 MG/3ML) 0.083% nebulizer solution 2.5 mg  2.5 mg Nebulization Q6H PRN Vianne Bulls, MD      . aspirin EC tablet 81 mg  81 mg Oral Daily Vianne Bulls, MD   81 mg at 02/16/16 1005  . atorvastatin (LIPITOR) tablet 10 mg  10 mg Oral QHS Vianne Bulls, MD   10 mg at 02/15/16 2116  . benzonatate (TESSALON) capsule 100 mg  100 mg Oral Q8H PRN Ilene Qua Opyd, MD      . clobetasol  cream (TEMOVATE) 8.50 % 1 application  1 application Topical BID PRN Vianne Bulls, MD      . diltiazem (CARDIZEM CD) 24 hr capsule 120 mg  120 mg Oral Daily Vianne Bulls, MD   120 mg at 02/16/16 1005  . donepezil (ARICEPT) tablet 10 mg  10 mg Oral Daily Vianne Bulls, MD   10 mg at 02/16/16 1005  . DULoxetine (CYMBALTA) DR capsule 60 mg  60 mg Oral Daily Vianne Bulls, MD   60 mg at 02/16/16 1005  . famotidine (PEPCID) tablet 40 mg  40 mg Oral QHS Vianne Bulls, MD   40 mg at 02/15/16 2116  . fluticasone furoate-vilanterol (BREO ELLIPTA) 100-25 MCG/INH 2 puff  2 puff Inhalation BID Vianne Bulls, MD   2 puff at 02/16/16 0802  . folic acid (FOLVITE) tablet 1 mg  1 mg Oral Daily Ilene Qua Opyd, MD   1 mg at 02/16/16 1005  . haloperidol lactate (HALDOL) injection 2 mg  2 mg Intravenous Q6H PRN Hosie Poisson, MD      . heparin injection 5,000 Units  5,000 Units Subcutaneous Q8H Vianne Bulls, MD   5,000 Units at 02/16/16 0558  . mirtazapine (REMERON) tablet 15 mg  15 mg Oral QHS Vianne Bulls, MD   15 mg at 02/15/16 2117  . ondansetron (ZOFRAN) tablet 4 mg  4 mg Oral Q6H PRN Vianne Bulls, MD       Or  . ondansetron (ZOFRAN) injection 4 mg  4 mg Intravenous Q6H PRN Ilene Qua Opyd, MD      . pantoprazole (PROTONIX) EC tablet 40 mg  40 mg Oral Daily Vianne Bulls, MD   40 mg at 02/16/16 1005  . sodium chloride flush (NS) 0.9 % injection 3 mL  3 mL Intravenous Q12H Vianne Bulls, MD   3 mL at 02/15/16 2117  . thiamine (VITAMIN B-1) tablet 100 mg  100 mg Oral Daily Vianne Bulls, MD   100 mg at 02/16/16 1005    Musculoskeletal: Strength & Muscle Tone: within normal limits Gait & Station: unable to stand Patient leans: N/A  Psychiatric Specialty Exam: Physical Exam As per history and physical  ROS Patient complaining of stomach pain upset, irritability agitation and anger towards the staff. Patient denied chest pain and Shortness of breath. No Fever-chills, No Headache, No changes with  Vision or hearing, reports vertigo No problems swallowing food or Liquids, No Chest pain, Cough or Shortness of Breath, No Abdominal pain, No Nausea or Vommitting,  Bowel movements are regular, No Blood in stool or Urine, No dysuria, No new skin rashes or bruises, No new joints pains-aches,  No new weakness, tingling, numbness in any extremity, No recent weight gain or loss, No polyuria, polydypsia or polyphagia,   A full 10 point Review of Systems was done, except as stated above, all other Review of Systems were negative.  Blood pressure (!) 133/52, pulse 73, temperature 97.6 F (36.4 C), temperature source Oral, resp. rate 20, height 5' (1.524 m), weight 57.4 kg (126 lb 8 oz), SpO2 96 %.Body mass index is 24.71 kg/m.  General Appearance: Guarded  Eye Contact:  Good  Speech:  Clear and Coherent  Volume:  Normal  Mood:  Angry, Anxious and Irritable  Affect:  Labile  Thought Process:  Disorganized  Orientation:  Full (Time, Place, and Person)  Thought Content:  Illogical and Rumination  Suicidal Thoughts:  No  Homicidal Thoughts:  No  Memory:  Immediate;   Good Recent;   Fair Remote;   Fair  Judgement:  Poor  Insight:  Shallow  Psychomotor Activity:  Restlessness  Concentration:  Concentration: Good and Attention Span: Fair  Recall:  [place] of Knowledge:  Good  Language:  Good  Akathisia:  Negative  Handed:  Right  AIMS (if indicated):     Assets:  Agricultural consultant Housing Leisure Time Resilience Social Support Talents/Skills Transportation  ADL's:  Impaired  Cognition:  WNL  Sleep:        Treatment Plan Summary: 82 years old female with altered mental status and slowly improving her status with the current supportive therapy and medication management. Patient continued to be irritable, restless and upset about being restrained and at the same time could not understand the need of cooperative behavior. Patient appeared to be  slowly improving from delirious state.  Based on my evaluation patient continued to meet criteria for capacity to make her medical decisions and living arrangements. Patient has intact cognitions except some problems with the behaviors and attitudes during this evaluation which may be changed from time to time secondary to being delirious since admission.  Continue her current medications including Cymbalta 60 mg daily for depression, Remeron 15 mg at bedtime for insomnia and Aricept 10 mg daily for memory Deficits Agree Haldol 2-3 mg every 8 hours as needed for agitation and aggressive behaviors secondary to delirium Continue soft restraints to prevent falls and safety of the patient Patient has no suicidal or homicidal ideation, intention or plans. Case discussed with LCSW who will contact patient daughter who is also power of attorney regarding collateral information.  Daily contact with patient to assess and evaluate symptoms and progress in treatment and Medication management  Disposition: Patient will benefit from home health care after discharge from the hospital Patient does not meet criteria for psychiatric inpatient admission. Supportive therapy provided about ongoing stressors.  Ambrose Finland, MD 02/16/2016 11:28 AM

## 2016-02-16 NOTE — Evaluation (Signed)
Physical Therapy Evaluation Patient Details Name: Tammy [name] MRN: [id number] DOB: [birth year] Today's Date: 02/16/2016   History of Present Illness  82 yo female admitted with acute encephalopathy, acute renal failure. Hx of bradycardia, HTN, A fib, CAD, falls. Recent d/c from [facility]  Clinical Impression  On eval, pt was Min guard assist for mobility. She walked ~250 feet with a 4 wheeled walker. Pt c/o dizziness during session but she was able to participate. No family present during session. Familiar with pt from previous admission. Recommend HHPT and 24 hour supervision. Pt and family may need to consider ALF in near future or consider having in-home supervision.     Follow Up Recommendations Home health PT;Supervision/Assistance - 24 hour    Equipment Recommendations  None recommended by PT    Recommendations for Other Services       Precautions / Restrictions Precautions Precautions: Fall Restrictions Weight Bearing Restrictions: No      Mobility  Bed Mobility Overal bed mobility: Needs Assistance             General bed mobility comments: supervision for safety  Transfers Overall transfer level: Needs assistance   Transfers: Sit to/from Stand;Stand Pivot Transfers Sit to Stand: Min guard Stand pivot transfers: Min guard       General transfer comment: close guard for safety.   Ambulation/Gait Ambulation/Gait assistance: Min guard Ambulation Distance (Feet): 250 Feet Assistive device: 4-wheeled walker Gait Pattern/deviations: Step-through pattern;Decreased stride length     General Gait Details: close guard for safesty. Pt c/o dizziness throughout ambulation. No overt LOB but slightly unsteady.   Stairs            Wheelchair Mobility    Modified Rankin (Stroke Patients Only)       Balance                                             Pertinent Vitals/Pain Pain Assessment: No/denies pain    Home Living Family/patient  expects to be discharged to:: Private residence Living Arrangements: Alone Available Help at Discharge: Family Type of Home: House Home Access: Stairs to enter Entrance Stairs-Rails: Psychiatric nurse of Steps: 3-4   Home Equipment: Environmental consultant - 4 wheels      Prior Function Level of Independence: Independent with assistive device(s)               Hand Dominance        Extremity/Trunk Assessment   Upper Extremity Assessment: Overall WFL for tasks assessed           Lower Extremity Assessment: Overall WFL for tasks assessed      Cervical / Trunk Assessment: Normal  Communication   Communication: HOH  Cognition Arousal/Alertness: Awake/alert Behavior During Therapy: WFL for tasks assessed/performed Overall Cognitive Status: Within Functional Limits for tasks assessed (during PT session. Per notes, pt had difficulty overnight)                      General Comments      Exercises     Assessment/Plan    PT Assessment Patient needs continued PT services  PT Problem List Decreased mobility;Decreased balance;Decreased activity tolerance          PT Treatment Interventions DME instruction;Gait training;Functional mobility training;Therapeutic activities;Therapeutic exercise;Patient/family education;Balance training    PT Goals (Current goals can be found in the Care  Plan section)  Acute Rehab PT Goals Patient Stated Goal: none stated PT Goal Formulation: With patient Time For Goal Achievement: 03/01/16 Potential to Achieve Goals: Good    Frequency Min 3X/week   Barriers to discharge        Co-evaluation               End of Session Equipment Utilized During Treatment: Gait belt Activity Tolerance: Patient tolerated treatment well Patient left: in bed;with call bell/phone within reach;with bed alarm set      Functional Assessment Tool Used: clinical judgement Functional Limitation: Mobility: Walking and moving  around Mobility: Walking and Moving Around Current Status [id number]): At least 1 percent but less than 20 percent impaired, limited or restricted Mobility: Walking and Moving Around Goal Status [phone]): At least 1 percent but less than 20 percent impaired, limited or restricted    Time: 1015-1029 PT Time Calculation (min) (ACUTE ONLY): 14 min   Charges:   PT Evaluation $PT Eval Low Complexity: 1 Procedure     PT G Codes:   PT G-Codes **NOT FOR INPATIENT CLASS** Functional Assessment Tool Used: clinical judgement Functional Limitation: Mobility: Walking and moving around Mobility: Walking and Moving Around Current Status [id number]): At least 1 percent but less than 20 percent impaired, limited or restricted Mobility: Walking and Moving Around Goal Status [phone]): At least 1 percent but less than 20 percent impaired, limited or restricted    Weston Anna, MPT Pager: [phone]

## 2016-02-16 NOTE — Progress Notes (Signed)
Pulled one of the restraint out for patient to eat then she pulled her IV out and pulling other tubes.

## 2016-02-17 LAB — GLUCOSE, CAPILLARY: Glucose-Capillary: 99 mg/dL (ref 65–99)

## 2016-02-17 MED ORDER — HALOPERIDOL 2 MG PO TABS: 2.0000 mg | ORAL_TABLET | Freq: Three times a day (TID) | ORAL | 0 refills | Status: DC | PRN

## 2016-02-17 NOTE — Progress Notes (Signed)
Discharge instructions discussed with daughter and patient until no further questions ask.

## 2016-02-17 NOTE — Discharge Summary (Signed)
Physician Discharge Summary  Tammy [name] [id number] DOB: April 16, 1931 DOA: 02/14/2016  PCP:  Melinda Crutch, MD  Admit date: 02/14/2016 Discharge date: 02/17/2016  Admitted From: HOme  Disposition:  Home.   Recommendations for Outpatient Follow-up:  1. Follow up with PCP in 1-2 weeks 2. Please obtain BMP/CBC in one week 3. Please follow up with psychiatry as outpatient.   Home Health: yes Equipment/Devices: none  Discharge Condition: stable.  CODE STATUS: FULL CODE.  Diet recommendation: Heart Healthy   Brief/Interim Summary:  [name] a 82 y.o.femalewith medical history significant for coronary artery disease, hypertension, paroxysmal atrial fibrillation, GERD, and memory loss who presents the emergency department for evaluation of lightheadedness upon standing, nausea, lower abdominal pain, and confusion. She was recently admitted on 9/28 and discharged on 9/29, readmitted on 9/30 for the above symptoms. Daughter is thePOA  And is very concerned of her safety. She reports, pt was found urinating and defecated all over her bedroom and was roaming naked in the house on multiple occasions.  She was found to be in acute renal failure and started on IV fluids.   Discharge Diagnoses:  Principal Problem:   Acute encephalopathy Active Problems:   Coronary artery disease involving native coronary artery of native heart with angina pectoris (HCC)   Essential hypertension   Memory loss   PAF (paroxysmal atrial fibrillation) (HCC)   Bradycardia on ECG   Leukocytosis   Depression   Hyponatremia   Elevated lactic acid level   Dizziness   Lower abdominal pain   Diverticulosis   Encephalopathy  Acute encephalopathy probably worsening of her dementia vs paranoia vs metabolic encephalopathy from acute renal failure.  Admitted for further evaluation.  MRI brain ordered, and psychiatry consulted.  Haldol ordered for delirium and agitation.  Psychiatry deemed she is currently  competent to make medical decisions and decisions regardingliving situation.  MRI brain negative for acute stroke .  Pt agreed to go to ALF and Home health orders put in to transition to ALF from home.     Acute renal failure on Stage 2 CKD: - probably from dehydration.  - gentle hydration, shows improvement in creatinine.  - hold ACE and nephrotoxic agents.  - recommend checking creatinine in one week and if back to baseline recommend restarting lisinopril.   H/o depression: Resume cymbalta.   Bradycardia: Resolved.    Paroxysmal atrial fibrillation: Rate is around 90's.  Resume BB at a lower dose.    CAD:  No chest pain.   Elevated lactic acid: Probably from dehydration, improved with hydration.    Discharge Instructions  Discharge Instructions    Diet - low sodium heart healthy    Complete by:  As directed    Discharge instructions    Complete by:  As directed    Please follow up with PCP in one week.  Please get BMP checked in one week to check renal function.  We stopped lisinopril and lasix as your creatinine is elevated.  Please resume them once your renal function is back to baseline .       Medication List    STOP taking these medications   fluticasone 50 MCG/ACT nasal spray Commonly known as:  FLONASE   furosemide 20 MG tablet Commonly known as:  LASIX   lisinopril 20 MG tablet Commonly known as:  PRINIVIL,ZESTRIL   loperamide 2 MG tablet Commonly known as:  IMODIUM A-D     TAKE these medications   acetaminophen 500 MG tablet  Commonly known as:  TYLENOL Take 1,000 mg by mouth every 8 (eight) hours as needed for moderate pain.   aspirin 81 MG tablet Take 81 mg by mouth daily.   atorvastatin 10 MG tablet Commonly known as:  LIPITOR Take 1 tablet (10 mg total) by mouth at bedtime.   benzonatate 100 MG capsule Commonly known as:  TESSALON Take 100 mg by mouth every 8 (eight) hours as needed for cough.   BREO ELLIPTA 100-25  MCG/INH Aepb Generic drug:  fluticasone furoate-vilanterol Inhale 2 puffs into the lungs 2 (two) times daily.   clobetasol cream 0.05 % Commonly known as:  TEMOVATE Apply 1 application topically 2 (two) times daily as needed (itching and inflammation).   diltiazem 120 MG 24 hr capsule Commonly known as:  CARDIZEM CD Take 1 capsule (120 mg total) by mouth daily.   donepezil 10 MG tablet Commonly known as:  ARICEPT Take 1 tablet (10 mg total) by mouth daily.   DULoxetine 60 MG capsule Commonly known as:  CYMBALTA Take 1 capsule by mouth daily. Reported on [id number]   folic acid 1 MG tablet Commonly known as:  FOLVITE Take 1 tablet (1 mg total) by mouth daily.   haloperidol 2 MG tablet Commonly known as:  HALDOL Take 1 tablet (2 mg total) by mouth every 8 (eight) hours as needed for agitation.   mirtazapine 15 MG tablet Commonly known as:  REMERON Take 15 mg by mouth at bedtime. Reported on 08/05/2015   omeprazole 20 MG capsule Commonly known as:  PRILOSEC Take 1 capsule (20 mg total) by mouth 2 (two) times daily before a meal. What changed:  when to take this   PROBIOTIC DAILY Caps Take 1 capsule by mouth daily.   ranitidine 300 MG tablet Commonly known as:  ZANTAC Take 1 tablet (300 mg total) by mouth at bedtime.   thiamine 100 MG tablet Take 1 tablet (100 mg total) by mouth daily.   VENTOLIN HFA 108 (90 Base) MCG/ACT inhaler Generic drug:  albuterol Inhale 1 puff into the lungs every 12 (twelve) hours as needed for shortness of breath.      Follow-up [place] Why:  [facility] nursing, physical therapy,occupational therapy,& social worker Contact information: [address] [place] [address] [phone]          Allergies  Allergen Reactions  . Penicillins Hives    Has patient had a PCN reaction causing immediate rash, facial/tongue/throat swelling, SOB or lightheadedness with  hypotension: No Has patient had a PCN reaction causing severe rash involving mucus membranes or skin necrosis: Yes Has patient had a PCN reaction that required hospitalization No Has patient had a PCN reaction occurring within the last 10 years: Yes took again last year 2016 If all of the above answers are "NO", then may proceed with Cephalosporin use.   . Lactose Intolerance (Gi)     Unknown- [facility] documented    Consultations:  psychiatry.    Procedures/Studies: Dg Chest 2 View  Result Date: 02/14/2016 CLINICAL DATA:  82 year old female with cramping of bilateral legs today. History of asthma. History of atrial fibrillation. Former smoker. EXAM: CHEST  2 VIEW COMPARISON:  Chest x-ray 02/11/2016. FINDINGS: No acute consolidative airspace disease. No pleural effusions. No suspicious appearing pulmonary nodules or masses. No pneumothorax. Mild cardiomegaly. No evidence of pulmonary edema. Upper mediastinal contours are within normal limits. Aortic atherosclerosis.  Status post median sternotomy for CABG. IMPRESSION: 1. No radiographic evidence of acute cardiopulmonary disease. 2. Mild cardiomegaly. 3. Aortic atherosclerosis. Electronically Signed   By: Vinnie Langton M.D.   On: 02/14/2016 17:13   Dg Chest 2 View  Result Date: 02/11/2016 CLINICAL DATA:  Lightheadedness. EXAM: CHEST  2 VIEW COMPARISON:  01/01/2016 FINDINGS: Previous median sternotomy and CABG procedure.Mild cardiac enlargement. No pleural effusion or edema. Lungs are clear. Spondylosis identified within the thoracic spine. IMPRESSION: 1. No acute findings. 2. Cardiac enlargement. Electronically Signed   By: Kerby Moors M.D.   On: 02/11/2016 23:17   Ct Head Wo Contrast  Result Date: 02/14/2016 CLINICAL DATA:  Altered mental status and headache EXAM: CT HEAD WITHOUT CONTRAST TECHNIQUE: Contiguous axial images were obtained from the base of the skull through the vertex without intravenous contrast.  COMPARISON:  Head CT 12/31/2015 FINDINGS: Brain: No mass lesion, intraparenchymal hemorrhage or extra-axial collection. No evidence of acute cortical infarct. There is periventricular hypoattenuation compatible with chronic microvascular disease. There is bilateral basal ganglia mineralization. Old right cerebellar infarct. Vascular: No hyperdense vessel or unexpected calcification. Skull: Normal visualized skull base, calvarium and extracranial soft tissues. Sinuses/Orbits: No sinus fluid levels or advanced mucosal thickening. No mastoid effusion. Normal orbits. IMPRESSION: 1. No acute intracranial abnormality. 2. Unchanged remote right cerebellar infarct and findings of chronic microvascular disease. Electronically Signed   By: Ulyses Jarred M.D.   On: 02/14/2016 21:06   Mr Brain Wo Contrast  Result Date: 02/17/2016 CLINICAL DATA:  Lightheadedness, worsening confusion, now incontinent. History of hypertension, coronary artery disease and atrial fibrillation. EXAM: MRI HEAD WITHOUT CONTRAST TECHNIQUE: Multiplanar, multiecho pulse sequences of the brain and surrounding structures were obtained without intravenous contrast. COMPARISON:  CT HEAD February 14, 2016 and CT HEAD November 22, 2006 FINDINGS: BRAIN: No reduced diffusion to suggest acute ischemia. No susceptibility artifact to suggest hemorrhage. The ventricles and sulci are normal for patient's age ; chronic asymmetry of occipital horns. Mild supra tentorial and pontine chronic small vessel ischemic disease, less than expected for age. No suspicious parenchymal signal, masses or mass effect. No abnormal extra-axial fluid collections. No extra-axial masses though, contrast enhanced sequences would be more sensitive. VASCULAR: Normal major intracranial vascular flow voids present at skull base. SKULL AND UPPER CERVICAL SPINE: No abnormal sellar expansion. No suspicious calvarial bone marrow signal. Craniocervical junction maintained. SINUSES/ORBITS: The  mastoid air-cells and included paranasal sinuses are well-aerated. Status post bilateral ocular lens implants. The included ocular globes and orbital contents are non-suspicious. OTHER: None. IMPRESSION: Negative MRI head for age. Electronically Signed   By: Elon Alas M.D.   On: 02/17/2016 00:17   Ct Abdomen Pelvis W Contrast  Result Date: 02/15/2016 CLINICAL DATA:  Confused and combative this morning with lower abdominal tenderness, left greater than right. EXAM: CT ABDOMEN AND PELVIS WITH CONTRAST TECHNIQUE: Multidetector CT imaging of the abdomen and pelvis was performed using the standard protocol following bolus administration of intravenous contrast. CONTRAST:  ISOVUE-300 IOPAMIDOL (ISOVUE-300) INJECTION 61% COMPARISON:  CT abdomen pelvis - 01/24/2013; lumbar spine MRI - 01/18/2013 FINDINGS: Lower chest: Limited visualization of lower thorax demonstrates minimal dependent subpleural ground-glass atelectasis. No focal airspace opacities. No pleural effusion. Normal heart size.  No pericardial effusion. Hepatobiliary: Normal hepatic contour. There is mild diffuse decreased attenuation of the hepatic parenchyma on this postcontrast examination suggestive of hepatic steatosis. No discrete hepatic lesions. There are several laminated gallstones within the neck of an otherwise normal-appearing gallstone bladder with dominant stone measuring  approximately 1.2 cm in diameter (coronal image 62, series 5). No gallbladder wall thickening or pericholecystic fluid. Normal caliber the common bile duct for age. No intra extrahepatic bili duct dilatation. No ascites. Pancreas: Normal appearance of the pancreas. No peripancreatic stranding. Spleen: Normal appearance of the spleen. Adrenals/Urinary Tract: There is symmetric enhancement and excretion of the bilateral kidneys. No definite renal stones on this postcontrast examination. Sub cm left-sided renal lesion is too small to adequately characterize of favored to  represent a renal cyst. No discrete right-sided renal lesions. No urinary obstruction or perinephric stranding. Normal appearance of the urinary bladder given degree distention. Normal appearance of the bilateral adrenal glands. Stomach/Bowel: Rather extensive colonic diverticulosis without evidence of superimposed acute diverticulitis. Normal appearance of the terminal ileum and retrocecal appendix. Enteric contrast extends to the level of the cecum. No evidence of enteric obstruction. No pneumoperitoneum, pneumatosis or portal venous gas. Vascular/Lymphatic: Moderate amount of eccentric mixed calcified and noncalcified atherosclerotic plaque within a normal caliber abdominal aorta. The major branch vessels of the abdominal aorta appear patent on this non CTA examination. No bulky retroperitoneal, mesenteric, pelvic or inguinal lymphadenopathy. Reproductive: Normal appearance of the pelvic organs for age. No free fluid in the pelvic cul-de-sac. Other: Ill-defined stranding and scattered foci of subcutaneous emphysema about the anterior aspect of the lower abdominal pannus likely at the location of prior subcutaneous medication administration. Musculoskeletal: No acute or aggressive osseous abnormalities. 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 Moderate severe multilevel lumbar spine DDD, worse at L2-L3 and L3-L4 with disc space height loss, endplate irregularity and sclerosis. Increased sclerosis involving the left femoral head may represent the sequela of early avascular necrosis. No evidence of articular surface collapse. IMPRESSION: 1. No acute findings within the abdomen or pelvis. 2. Extensive colonic diverticulosis without evidence of diverticulitis. 3. Cholelithiasis without evidence of cholecystitis. 4.  Aortic Atherosclerosis (ICD10-170.0) Electronically Signed   By: Sandi Mariscal M.D.   On: 02/15/2016 08:58       Subjective: No new complaints.. Reports being sleepy .  Discharge  Exam: Vitals:   02/17/16 0917 02/17/16 1435  BP: (!) 155/53 (!) 140/51  Pulse: 77 97  Resp:  20  Temp: 97.5 F (36.4 C) 97.3 F (36.3 C)   Vitals:   02/17/16 0452 02/17/16 0917 02/17/16 0927 02/17/16 1435  BP: (!) 152/45 (!) 155/53  (!) 140/51  Pulse: 71 77  97  Resp: (!) 22   20  Temp: 97.9 F (36.6 C) 97.5 F (36.4 C)  97.3 F (36.3 C)  TempSrc: Axillary Oral  Oral  SpO2: 100% 98% 92% 99%  Weight:      Height:        General: Pt is alert, awake, not in acute distress Cardiovascular: RRR, S1/S2 +, no rubs, no gallops Respiratory: CTA bilaterally, no wheezing, no rhonchi Abdominal: Soft, NT, ND, bowel sounds + Extremities: no edema, no cyanosis    The results of significant diagnostics from this hospitalization (including imaging, microbiology, ancillary and laboratory) are listed below for reference.     Microbiology: Recent Results (from the past 240 hour(s))  Urine culture     Status: Abnormal   Collection Time: 02/14/16  4:32 PM  Result Value Ref Range Status   Specimen Description URINE, CLEAN CATCH  Final   Special Requests NONE  Final   Culture MULTIPLE SPECIES PRESENT, SUGGEST RECOLLECTION (A)  Final   Report Status 02/16/2016 FINAL  Final  Culture, blood (x 2)     Status:  None (Preliminary result)   Collection Time: 02/15/16  1:43 AM  Result Value Ref Range Status   Specimen Description BLOOD LEFT ARM  Final   Special Requests IN PEDIATRIC BOTTLE 2CC  Final   Culture   Final    NO GROWTH 2 DAYS Performed at [facility]    Report Status PENDING  Incomplete  Culture, blood (x 2)     Status: None (Preliminary result)   Collection Time: 02/15/16  1:48 AM  Result Value Ref Range Status   Specimen Description BLOOD LEFT ARM  Final   Special Requests BOTTLES DRAWN AEROBIC ONLY 5 CC  Final   Culture   Final    NO GROWTH 2 DAYS Performed at [facility]    Report Status PENDING  Incomplete     Labs: BNP (last 3 results)  Recent  Labs  02/12/16 0801 02/14/16 1727  BNP 651.0* Q000111Q*   Basic Metabolic Panel:  Recent Labs Lab 02/11/16 2106 02/12/16 0801 02/14/16 1727 02/15/16 0157 02/16/16 0905  NA 134* 138 132* 130* 133*  K 3.9 3.8 4.0 3.6 3.8  CL 104 110 95* 94* 98*  CO2 20* 21* 23 27 27   GLUCOSE 142* 114* 108* 131* 143*  BUN 38* 25* 19 25* 21*  CREATININE 1.74* 1.11* 1.05* 1.20* 1.21*  CALCIUM 9.0 8.7* 9.6 8.5* 8.8*  MG  --   --  1.7  --   --    Liver Function Tests:  Recent Labs Lab 02/14/16 1727  AST 26  ALT 19  ALKPHOS 90  BILITOT 0.6  PROT 8.5*  ALBUMIN 4.5   No results for input(s): LIPASE, AMYLASE in the last 168 hours.  Recent Labs Lab 02/15/16 0157  AMMONIA 23   CBC:  Recent Labs Lab 02/11/16 2106 02/14/16 1727 02/15/16 0157  WBC 17.3* 14.0* 11.5*  NEUTROABS  --  11.0*  --   HGB 12.1 13.5 11.5*  HCT 37.3 40.3 34.2*  MCV 86.9 85.7 85.3  PLT 337 434* 380   Cardiac Enzymes:  Recent Labs Lab 02/12/16 0801 02/12/16 1352  TROPONINI <0.03 <0.03   BNP: Invalid input(s): POCBNP CBG:  Recent Labs Lab 02/11/16 2036 02/12/16 0743 02/15/16 0809 02/16/16 0809 02/17/16 0745  GLUCAP 147* 120* 102* 108* 99   D-Dimer No results for input(s): DDIMER in the last 72 hours. Hgb A1c No results for input(s): HGBA1C in the last 72 hours. Lipid Profile No results for input(s): CHOL, HDL, LDLCALC, TRIG, CHOLHDL, LDLDIRECT in the last 72 hours. Thyroid function studies No results for input(s): TSH, T4TOTAL, T3FREE, THYROIDAB in the last 72 hours.  Invalid input(s): FREET3 Anemia work up  Recent Labs  02/15/16 0157  VITAMINB12 249   Urinalysis    Component Value Date/Time   COLORURINE YELLOW 02/14/2016 [place] 02/14/2016 1632   LABSPEC 1.009 02/14/2016 1632   PHURINE 5.5 02/14/2016 1632   GLUCOSEU NEGATIVE 02/14/2016 1632   HGBUR NEGATIVE 02/14/2016 1632   BILIRUBINUR NEGATIVE 02/14/2016 1632   KETONESUR NEGATIVE 02/14/2016 1632    PROTEINUR NEGATIVE 02/14/2016 1632   UROBILINOGEN 0.2 07/11/2009 2220   NITRITE NEGATIVE 02/14/2016 1632   LEUKOCYTESUR NEGATIVE 02/14/2016 1632   Sepsis Labs Invalid input(s): PROCALCITONIN,  WBC,  LACTICIDVEN Microbiology Recent Results (from the past 240 hour(s))  Urine culture     Status: Abnormal   Collection Time: 02/14/16  4:32 PM  Result Value Ref Range Status   Specimen Description URINE, CLEAN CATCH  Final   Special  Requests NONE  Final   Culture MULTIPLE SPECIES PRESENT, SUGGEST RECOLLECTION (A)  Final   Report Status 02/16/2016 FINAL  Final  Culture, blood (x 2)     Status: None (Preliminary result)   Collection Time: 02/15/16  1:43 AM  Result Value Ref Range Status   Specimen Description BLOOD LEFT ARM  Final   Special Requests IN PEDIATRIC BOTTLE 2CC  Final   Culture   Final    NO GROWTH 2 DAYS Performed at [facility]    Report Status PENDING  Incomplete  Culture, blood (x 2)     Status: None (Preliminary result)   Collection Time: 02/15/16  1:48 AM  Result Value Ref Range Status   Specimen Description BLOOD LEFT ARM  Final   Special Requests BOTTLES DRAWN AEROBIC ONLY 5 CC  Final   Culture   Final    NO GROWTH 2 DAYS Performed at [facility]    Report Status PENDING  Incomplete     Time coordinating discharge: Over 30 minutes  SIGNED:   Hosie Poisson, MD  [facility] 02/17/2016, 3:57 PM Pager   If 7PM-7AM, please contact night-coverage [email or web address] Password [email or web address]

## 2016-02-17 NOTE — Clinical Social Work Note (Signed)
Clinical Social Work Assessment  Patient Details  Name: Tammy [name] MRN: [id number] Date of Birth: 06-17-1930  Date of referral:  02/16/16               Reason for consult:  Capacity                Permission sought to share information with:  Psychiatrist, Family Supports Permission granted to share information::  Yes, Verbal Permission Granted  Name::        Agency::     Relationship::   Daughter: Clement Husbands Information:   [phone]  Housing/Transportation Living arrangements for the past 2 months:  [place] of Information:  Patient, Power of [place], Adult Children Patient Interpreter Needed:  None Criminal Activity/Legal Involvement Pertinent to Current Situation/Hospitalization:  No - Comment as needed Significant Relationships:  Adult Children Lives with:  Self Do you feel safe going back to the place where you live?  Yes Need for family participation in patient care:  Yes   Care giving concerns: LCSWA and Psychiatrist met with patient at bedside. Patient was alert/ oriented and agreeable to assessment. Patient was restrained and expressesd her dislike of the restraints. Patient reported she is upset because the nurses are "mean to me" therefore "I will be mean back to them."  The patient was able to answer questions presented by Psychiatrist. The patient expressed she lives alone and takes care of herself. She is agreeable to [facility] as she already utilizes their services. Patient does not want to live anywhere else but home.  Patient gave LCSWA permission to contact daughter.    Social Worker assessment / plan:  LCSWA spoke with patient daughter-POA. She feels the patient cannot manage at home alone by herself. She reported the patient is forgetting to take her medication and needs assistance daily. She feels Home Health services is not enough for patient.  She expressed the patient will not  agree to ALF placement, the patient wants to  keep her independence.   LCSWA provided emotional support. Encouraged the daughter to view facilities and start to plan future transition with the patient.  LCSWA provided the patient with a ALF list/ and contacts. Left on patient chart for pick up.  Employment status:  Retired Nurse, adult PT Recommendations:  Home with [place] / Referral to community resources:     Patient/Family's Response to care:  The patient agreeable to care from physician but reports difficulties with the nurses.   Patient/Family's Understanding of and Emotional Response to Diagnosis, Current Treatment, and Prognosis:  " I drive myself to my doctors appointment and care for myself. Home health comes into the home a few times a week to assist me. " -Patient expressed her independence and disagress with her daughter concerns.   Emotional Assessment Appearance:  Appears stated age Attitude/Demeanor/Rapport:    Affect (typically observed):  Accepting Orientation:  Oriented to Self, Oriented to Place Alcohol / Substance use:  Not Applicable Psych involvement (Current and /or in the community):  Yes (Comment)  Discharge Needs  Concerns to be addressed:  Cognitive Concerns Readmission within the last 30 days:  No Current discharge risk:  None Barriers to Discharge:  No Barriers Identified   Lia Hopping, LCSW 02/17/2016, 9:37 AM

## 2016-02-17 NOTE — Care Management Note (Signed)
Case Management Note  Patient Details  Name: Tammy [name] MRN: [id number] Date of Birth: [birth year]  Subjective/Objective:  Faxed w/confirmation to [facility] [id number]. D/c home w/HHC.                  Action/Plan:d/c home w/HHC   Expected Discharge Date:                  Expected Discharge Plan:  [place]  In-House Referral:  Clinical Social Work  Discharge planning Services  CM Consult  Post Acute Care Choice:  Home Health (Active w/[facility] HHC-RN,PT,OT,CSW) Choice offered to:  Adult Children  DME Arranged:    DME Agency:     HH Arranged:  RN, PT, OT, Social Work [employer] Agency:   (Active w/[facility] care)  Status of Service:  Completed, signed off  If discussed at [employer], dates discussed:    Additional Comments:  Dessa Phi, RN 02/17/2016, 3:17 PM

## 2016-02-19 ENCOUNTER — Encounter (HOSPITAL_COMMUNITY): Payer: Self-pay | Admitting: *Deleted

## 2016-02-19 ENCOUNTER — Inpatient Hospital Stay (HOSPITAL_COMMUNITY)
Admission: EM | Admit: 2016-02-19 | Discharge: 2016-02-23 | DRG: 948 | Disposition: A | Discharge status: Home Health | Payer: Medicare HMO | Attending: Internal Medicine | Admitting: Internal Medicine

## 2016-02-19 ENCOUNTER — Emergency Department (HOSPITAL_COMMUNITY): Payer: Medicare HMO

## 2016-02-19 DIAGNOSIS — I2510 Atherosclerotic heart disease of native coronary artery without angina pectoris: Secondary | ICD-10-CM | POA: Diagnosis present

## 2016-02-19 DIAGNOSIS — K219 Gastro-esophageal reflux disease without esophagitis: Secondary | ICD-10-CM | POA: Diagnosis present

## 2016-02-19 DIAGNOSIS — K589 Irritable bowel syndrome without diarrhea: Secondary | ICD-10-CM | POA: Diagnosis present

## 2016-02-19 DIAGNOSIS — E785 Hyperlipidemia, unspecified: Secondary | ICD-10-CM | POA: Diagnosis present

## 2016-02-19 DIAGNOSIS — R680 Hypothermia, not associated with low environmental temperature: Principal | ICD-10-CM | POA: Diagnosis present

## 2016-02-19 DIAGNOSIS — Z87891 Personal history of nicotine dependence: Secondary | ICD-10-CM | POA: Diagnosis not present

## 2016-02-19 DIAGNOSIS — R159 Full incontinence of feces: Secondary | ICD-10-CM | POA: Diagnosis present

## 2016-02-19 DIAGNOSIS — T68XXXA Hypothermia, initial encounter: Secondary | ICD-10-CM | POA: Diagnosis not present

## 2016-02-19 DIAGNOSIS — Z8249 Family history of ischemic heart disease and other diseases of the circulatory system: Secondary | ICD-10-CM

## 2016-02-19 DIAGNOSIS — E739 Lactose intolerance, unspecified: Secondary | ICD-10-CM | POA: Diagnosis present

## 2016-02-19 DIAGNOSIS — Z951 Presence of aortocoronary bypass graft: Secondary | ICD-10-CM | POA: Diagnosis not present

## 2016-02-19 DIAGNOSIS — R6510 Systemic inflammatory response syndrome (SIRS) of non-infectious origin without acute organ dysfunction: Secondary | ICD-10-CM | POA: Diagnosis present

## 2016-02-19 DIAGNOSIS — R52 Pain, unspecified: Secondary | ICD-10-CM

## 2016-02-19 DIAGNOSIS — F0390 Unspecified dementia without behavioral disturbance: Secondary | ICD-10-CM | POA: Diagnosis present

## 2016-02-19 DIAGNOSIS — J449 Chronic obstructive pulmonary disease, unspecified: Secondary | ICD-10-CM | POA: Diagnosis present

## 2016-02-19 DIAGNOSIS — Z79899 Other long term (current) drug therapy: Secondary | ICD-10-CM | POA: Diagnosis not present

## 2016-02-19 DIAGNOSIS — Z880 Allergy status to penicillin: Secondary | ICD-10-CM | POA: Diagnosis not present

## 2016-02-19 DIAGNOSIS — Z7982 Long term (current) use of aspirin: Secondary | ICD-10-CM | POA: Diagnosis not present

## 2016-02-19 DIAGNOSIS — I480 Paroxysmal atrial fibrillation: Secondary | ICD-10-CM | POA: Diagnosis present

## 2016-02-19 DIAGNOSIS — R531 Weakness: Secondary | ICD-10-CM | POA: Diagnosis present

## 2016-02-19 DIAGNOSIS — R32 Unspecified urinary incontinence: Secondary | ICD-10-CM | POA: Diagnosis present

## 2016-02-19 DIAGNOSIS — F329 Major depressive disorder, single episode, unspecified: Secondary | ICD-10-CM | POA: Diagnosis present

## 2016-02-19 DIAGNOSIS — I10 Essential (primary) hypertension: Secondary | ICD-10-CM | POA: Diagnosis present

## 2016-02-19 DIAGNOSIS — I2721 Secondary pulmonary arterial hypertension: Secondary | ICD-10-CM | POA: Diagnosis present

## 2016-02-19 DIAGNOSIS — Z9181 History of falling: Secondary | ICD-10-CM | POA: Diagnosis not present

## 2016-02-19 DIAGNOSIS — T68XXXD Hypothermia, subsequent encounter: Secondary | ICD-10-CM | POA: Diagnosis not present

## 2016-02-19 DIAGNOSIS — L409 Psoriasis, unspecified: Secondary | ICD-10-CM | POA: Diagnosis present

## 2016-02-19 DIAGNOSIS — R109 Unspecified abdominal pain: Secondary | ICD-10-CM

## 2016-02-19 DIAGNOSIS — I272 Pulmonary hypertension, unspecified: Secondary | ICD-10-CM | POA: Diagnosis present

## 2016-02-19 LAB — COMPREHENSIVE METABOLIC PANEL
ALT: 52 U/L (ref 14–54)
AST: 46 U/L — ABNORMAL HIGH (ref 15–41)
Albumin: 3.5 g/dL (ref 3.5–5.0)
Alkaline Phosphatase: 75 U/L (ref 38–126)
Anion gap: 10 (ref 5–15)
BUN: 15 mg/dL (ref 6–20)
CO2: 21 mmol/L — ABNORMAL LOW (ref 22–32)
Calcium: 9 mg/dL (ref 8.9–10.3)
Chloride: 112 mmol/L — ABNORMAL HIGH (ref 101–111)
Creatinine, Ser: 0.92 mg/dL (ref 0.44–1.00)
GFR calc Af Amer: >60 mL/min (ref >60)
GFR calc non Af Amer: 56 mL/min — ABNORMAL LOW (ref >60)
Glucose, Bld: 100 mg/dL — ABNORMAL HIGH (ref 65–99)
Potassium: 4 mmol/L (ref 3.5–5.1)
Sodium: 143 mmol/L (ref 135–145)
Total Bilirubin: 0.4 mg/dL (ref 0.3–1.2)
Total Protein: 6.6 g/dL (ref 6.5–8.1)

## 2016-02-19 LAB — URINALYSIS, ROUTINE W REFLEX MICROSCOPIC
Bilirubin Urine: NEGATIVE
Glucose, UA: NEGATIVE mg/dL
Hgb urine dipstick: NEGATIVE
Ketones, ur: 15 mg/dL — AB
Leukocytes, UA: NEGATIVE
Nitrite: NEGATIVE
Protein, ur: NEGATIVE mg/dL
Specific Gravity, Urine: 1.019 (ref 1.005–1.030)
pH: 6 (ref 5.0–8.0)

## 2016-02-19 LAB — CBC WITH DIFFERENTIAL/PLATELET
Basophils Absolute: 0 10*3/uL (ref 0.0–0.1)
Basophils Relative: 0 %
Eosinophils Absolute: 0.1 10*3/uL (ref 0.0–0.7)
Eosinophils Relative: 1 %
HCT: 38.5 % (ref 36.0–46.0)
Hemoglobin: 12 g/dL (ref 12.0–15.0)
Lymphocytes Relative: 16 %
Lymphs Abs: 1.7 10*3/uL (ref 0.7–4.0)
MCH: 28.5 pg (ref 26.0–34.0)
MCHC: 31.2 g/dL (ref 30.0–36.0)
MCV: 91.4 fL (ref 78.0–100.0)
Monocytes Absolute: 1 10*3/uL (ref 0.1–1.0)
Monocytes Relative: 9 %
Neutro Abs: 8.1 10*3/uL — ABNORMAL HIGH (ref 1.7–7.7)
Neutrophils Relative %: 74 %
Platelets: 289 10*3/uL (ref 150–400)
RBC: 4.21 MIL/uL (ref 3.87–5.11)
RDW: 14.6 % (ref 11.5–15.5)
WBC: 10.9 10*3/uL — ABNORMAL HIGH (ref 4.0–10.5)

## 2016-02-19 LAB — POC OCCULT BLOOD, ED: Fecal Occult Bld: NEGATIVE

## 2016-02-19 LAB — LIPASE, BLOOD: Lipase: 47 U/L (ref 11–51)

## 2016-02-19 LAB — TROPONIN I: Troponin I: <0.03 ng/mL (ref <0.03)

## 2016-02-19 LAB — I-STAT CG4 LACTIC ACID, ED: Lactic Acid, Venous: 1.01 mmol/L (ref 0.5–1.9)

## 2016-02-19 MED ORDER — SODIUM CHLORIDE 0.9 % IV BOLUS (SEPSIS): 500.0000 mL | Freq: Once | INTRAVENOUS | Status: DC

## 2016-02-19 MED ORDER — SODIUM CHLORIDE 0.9 % IV BOLUS (SEPSIS): 30.0000 mL/kg | Freq: Once | INTRAVENOUS | Status: DC

## 2016-02-19 MED ORDER — DEXTROSE 5 % IV SOLN
1.0000 g | Freq: Three times a day (TID) | INTRAVENOUS | Status: DC
  Administered 2016-02-19 – 2016-02-20 (×2): 1 g via INTRAVENOUS
  Filled 2016-02-19 (×5): qty 1

## 2016-02-19 MED ORDER — AZTREONAM 2 G IJ SOLR: 2.0000 g | Freq: Three times a day (TID) | INTRAMUSCULAR | Status: DC

## 2016-02-19 MED ORDER — LEVOFLOXACIN IN D5W 750 MG/150ML IV SOLN
750.0000 mg | Freq: Once | INTRAVENOUS | Status: AC
  Administered 2016-02-19: 750 mg via INTRAVENOUS
  Filled 2016-02-19: qty 150

## 2016-02-19 MED ORDER — VANCOMYCIN HCL IN DEXTROSE 1-5 GM/200ML-% IV SOLN
1000.0000 mg | Freq: Once | INTRAVENOUS | Status: AC
  Administered 2016-02-19: 1000 mg via INTRAVENOUS
  Filled 2016-02-19: qty 200

## 2016-02-19 MED ORDER — MORPHINE SULFATE (PF) 2 MG/ML IV SOLN
2.0000 mg | Freq: Once | INTRAVENOUS | Status: AC
  Administered 2016-02-19: 2 mg via INTRAVENOUS
  Filled 2016-02-19: qty 1

## 2016-02-19 MED ORDER — SODIUM CHLORIDE 0.9 % IV BOLUS (SEPSIS)
2000.0000 mL | Freq: Once | INTRAVENOUS | Status: AC
  Administered 2016-02-19: 2000 mL via INTRAVENOUS

## 2016-02-19 NOTE — ED Provider Notes (Addendum)
[name] DEPT Provider Note   CSN: [id number] Arrival date & time: 02/19/16  1916     History   Chief Complaint Chief Complaint  Patient presents with  . Weakness    HPI Tammy [name] is a 82 y.o. female.  HPI Patient has had recurrent hospitalization and decline since the end of August. Patient's daughter reports that since August she seems to constantly been declining. She was discharged several days ago. She seemed to be better on the first day but then yesterday after eating lunch she complained of being cold and having chills and got in bed. Reports that she hasn't gotten out of bed since afternoon yesterday. She reports she seems much more fatigued and complaining of severe pain throughout her mid body and both legs. There are not been any injuries. Patient has been incontinent of urine for some time. She also was incontinent of stools tonight. At this time, patient is not localizing pain. She does report she does not feel good. Past Medical History:  Diagnosis Date  . Acid reflux   . Asthma    Asthmatic COPD  . Atrial fibrillation ([place]) 01/01/2016  . Coronary artery disease    Echo 07/27/2011   . Coronary artery stenosis    , High-grade left main  . GERD (gastroesophageal reflux disease)   . Hyperlipidemia   . Hypertension   . IBS (irritable bowel syndrome)   . Psoriasis     Patient Active Problem List   Diagnosis Date Noted  . SIRS (systemic inflammatory response syndrome) ([place]) 02/19/2016  . Diverticulosis   . Encephalopathy   . Hyponatremia 02/14/2016  . Acute encephalopathy 02/14/2016  . Elevated lactic acid level 02/14/2016  . Dizziness 02/14/2016  . Lower abdominal pain 02/14/2016  . Symptomatic bradycardia 02/12/2016  . Leukocytosis 02/12/2016  . Depression 02/12/2016  . Renal insufficiency   . SOB (shortness of breath)   . Bradycardia on ECG 02/11/2016  . Atrial fibrillation with RVR ([place]) 01/01/2016  . PAF (paroxysmal atrial fibrillation)  ([place]) 01/01/2016  . Alcohol dependence with withdrawal with complication ([place]) [id number]  . Fall 01/01/2016  . AKI (acute kidney injury) ([place])   . Dehydration   . Exertional dyspnea 09/11/2015  . Memory loss 11/13/2013  . Abnormal involuntary movements(781.0) 11/13/2013  . Pulmonary hypertension 11/02/2013  . Fatigue 11/02/2013  . Coronary artery disease involving native coronary artery of native heart with angina pectoris ([place]) 11/16/2012  . Mitral regurgitation 11/16/2012  . Renal artery stenosis ([place]) 11/16/2012  . Hyperlipidemia with target LDL less than 70 11/16/2012  . Essential hypertension 11/16/2012    Past Surgical History:  Procedure Laterality Date  . CARDIAC SURGERY    . CORONARY ARTERY BYPASS GRAFT  1994   After catheterization showed 90% ostial left main stenosis  . DOPPLER ECHOCARDIOGRAPHY     Study showed mild to moderate MR with moderate TR, mild to moderate pulmonary hypertension with an ejection fraction greater that 55%.   [name] EYE SURGERY    . Nuclear Perfusion  10/2010, 11/15/2012   Showed normal perfusion, No Ischemia or Infarction, Normal EF  . Renal Scan Duplex  04/09/2012   Showing greater than 50% diameter reduction in the celiac artery and SMA.. Right proximal 275 and Mid 152.  [name] ROTATOR CUFF REPAIR     bilateral  . Sleep Study  12/28/2006   AHI during total sleep time (3h 19 minutes) was 1.81/hr and during REM sleep at 17.78/hr. Mild sleep apnea during REM sleep.Oxygen staturated  rate during REM and NREM was 93.0%.    OB History    No data available       Home Medications    Prior to Admission medications   Medication Sig Start Date End Date Taking? Authorizing Provider  acetaminophen (TYLENOL) 500 MG tablet Take 1,000 mg by mouth 2 (two) times daily.    Yes Historical Provider, MD  aspirin 81 MG tablet Take 81 mg by mouth daily.    Yes Historical Provider, MD  benzonatate (TESSALON) 100 MG capsule Take 100 mg by mouth every 8 (eight) hours as  needed for cough.   Yes Historical Provider, MD  clobetasol cream (TEMOVATE) [id number] % Apply 1 application topically 2 (two) times daily as needed (itching and inflammation).   Yes Historical Provider, MD  diltiazem (CARDIZEM CD) 120 MG 24 hr capsule Take 1 capsule (120 mg total) by mouth daily. 01/06/16  Yes Ripudeep Krystal Eaton, MD  donepezil (ARICEPT) 10 MG tablet Take 1 tablet (10 mg total) by mouth daily. 07/13/14  Yes Marcial Pacas, MD  DULoxetine (CYMBALTA) 60 MG capsule Take 1 capsule by mouth daily. Reported on 08/05/2015 09/13/14  Yes Historical Provider, MD  fluticasone furoate-vilanterol (BREO ELLIPTA) 100-25 MCG/INH AEPB Inhale 2 puffs into the lungs 2 (two) times daily.   Yes Historical Provider, MD  folic acid (FOLVITE) 1 MG tablet Take 1 tablet (1 mg total) by mouth daily. 01/06/16  Yes Ripudeep Krystal Eaton, MD  mirtazapine (REMERON) 15 MG tablet Take 15 mg by mouth at bedtime. Reported on 08/05/2015   Yes Historical Provider, MD  omeprazole (PRILOSEC) 20 MG capsule Take 1 capsule (20 mg total) by mouth 2 (two) times daily before a meal. 08/05/15  Yes Jiles Prows, MD  Probiotic Product (PROBIOTIC DAILY) CAPS Take 1 capsule by mouth daily.   Yes Historical Provider, MD  ranitidine (ZANTAC) 300 MG tablet Take 1 tablet (300 mg total) by mouth at bedtime. 07/21/15  Yes [name], MD  thiamine 100 MG tablet Take 1 tablet (100 mg total) by mouth daily. 01/06/16  Yes Ripudeep Krystal Eaton, MD  VENTOLIN HFA 108 (90 BASE) MCG/ACT inhaler Inhale 1 puff into the lungs every 12 (twelve) hours as needed for shortness of breath.  11/27/12  Yes Historical Provider, MD  atorvastatin (LIPITOR) 10 MG tablet Take 1 tablet (10 mg total) by mouth at bedtime. 01/06/16   Ripudeep Krystal Eaton, MD  haloperidol (HALDOL) 2 MG tablet Take 1 tablet (2 mg total) by mouth every 8 (eight) hours as needed for agitation. Patient not taking: Reported on 02/19/2016 02/17/16   Hosie Poisson, MD    Family History Family History  Problem Relation Age of  Onset  . Heart disease Mother   . Hypertension Sister     Social History Social History  Substance Use Topics  . Smoking status: Former Smoker    Quit date: 11/08/1962  . Smokeless tobacco: Never Used  . Alcohol use 0.0 oz/week     Comment: drinks wine/beer daily     Allergies   Penicillins and Lactose intolerance (gi)   Review of Systems Review of Systems 10 Systems reviewed and are negative for acute change except as noted in the HPI.   Physical Exam Updated Vital Signs BP 161/73   Pulse 103   Temp (!) 95.9 F (35.5 C) (Rectal)   Resp 20   Ht 5' (1.524 m)   Wt 124 lb (56.2 kg)   SpO2 93%   BMI 24.22 kg/m  Physical Exam  Constitutional:  Patient is ill and deconditioned in appearance. No respiratory distress at rest.  HENT:  Head: Normocephalic and atraumatic.  Nose: Nose normal.  Mucous membranes are very dry. Posterior oropharynx is pink.  Eyes: EOM are normal. Pupils are equal, round, and reactive to light.  Neck: Neck supple. No JVD present.  Cardiovascular:  Tachycardia. No gross rub murmur gallop.  Pulmonary/Chest:  Patient does not have respiratory distress at rest. Lungs are grossly clear.  Abdominal: Soft. She exhibits no distension.  Patient endorses mild left lateral quadrant tenderness. There is no guarding present. She denies tenderness to the remainder of the abdominal exam. No right-sided tenderness.  Genitourinary:  Genitourinary Comments: Normal external female genitalia. Rectal exam is for no stool impaction. Soft yellow stool in the vault.  Musculoskeletal:  Patient can extend against resistance for both lower extremities. No significant peripheral edema. Minor, older appearing abrasions to the lower extremities. No evident lower extremity cellulitis at this time. Patient's back and buttocks are examined and no areas of skin breakdown or cellulitis.  Neurological: She is alert. No cranial nerve deficit. She exhibits normal muscle tone.    The patient seemed generally weak and fatigued however there is no localizing neurologic dysfunction.  Skin: Skin is warm and dry. Capillary refill takes less than 2 seconds. There is pallor.  Psychiatric:  Patient's mood is slightly depressed. She seems fatigued.     ED Treatments / Results  Labs (all labs ordered are listed, but only abnormal results are displayed) Labs Reviewed  COMPREHENSIVE METABOLIC PANEL - Abnormal; Notable for the following:       Result Value   Chloride 112 (*)    CO2 21 (*)    Glucose, Bld 100 (*)    AST 46 (*)    GFR calc non Af Amer 56 (*)    All other components within normal limits  CBC WITH DIFFERENTIAL/PLATELET - Abnormal; Notable for the following:    WBC 10.9 (*)    Neutro Abs 8.1 (*)    All other components within normal limits  URINALYSIS, ROUTINE W REFLEX MICROSCOPIC (NOT AT [facility]) - Abnormal; Notable for the following:    Ketones, ur 15 (*)    All other components within normal limits  URINE CULTURE  CULTURE, BLOOD (ROUTINE X 2)  CULTURE, BLOOD (ROUTINE X 2)  LIPASE, BLOOD  TROPONIN I  POC OCCULT BLOOD, ED  I-STAT CG4 LACTIC ACID, ED  I-STAT CG4 LACTIC ACID, ED  I-STAT CG4 LACTIC ACID, ED    EKG  EKG Interpretation  Date/Time:  Thursday February 19 2016 21:09:53 EDT Ventricular Rate:  108 PR Interval:    QRS Duration: 84 QT Interval:  354 QTC Calculation: 475 R Axis:   55 Text Interpretation:  Sinus tachycardia Nonspecific T abnormalities, lateral leads agree. no significant change c/w old. Confirmed by Johnney Killian, MD, Jeannie Done [phone]) on 02/20/2016 12:02:56 AM       Radiology Dg Chest Port 1 View  Result Date: 02/19/2016 CLINICAL DATA:  82 y/o F; pain, weakness, and fever for several days. EXAM: PORTABLE CHEST 1 VIEW COMPARISON:  02/14/2016 chest radiograph. FINDINGS: Stable cardiomediastinal silhouette given differences in technique. Aortic atherosclerosis with arch calcifications. Status post CABG. Sternotomy wires are  aligned. No acute osseous abnormality is identified. Low lung volumes with prominent pulmonary markings. Patchy opacities at lung bases probably represent atelectasis. Pulmonary venous hypertension. IMPRESSION: Low lung volumes. Bibasilar atelectasis. Pulmonary venous hypertension. No acute process identified. Electronically Signed  By: Kristine Garbe M.D.   On: 02/19/2016 21:03    Procedures Procedures (including critical care time)  CRITICAL CARE Performed by: Charlesetta Shanks   Total critical care time: 30 minutes  Critical care time was exclusive of separately billable procedures and treating other patients.  Critical care was necessary to treat or prevent imminent or life-threatening deterioration.  Critical care was time spent personally by me on the following activities: development of treatment plan with patient and/or surrogate as well as nursing, discussions with consultants, evaluation of patient's response to treatment, examination of patient, obtaining history from patient or surrogate, ordering and performing treatments and interventions, ordering and review of laboratory studies, ordering and review of radiographic studies, pulse oximetry and re-evaluation of patient's condition. Angiocath insertion Performed by: Charlesetta Shanks  Consent: Verbal consent obtained. Risks and benefits: risks, benefits and alternatives were discussed Time out: Immediately prior to procedure a "time out" was called to verify the correct patient, procedure, equipment, support staff and site/side marked as required.  Preparation: Patient was prepped and draped in the usual sterile fashion.  Vein Location: right ac Ultrasound Guided  Gauge:20  2 attempts made at peripheral IV establishment right arm with ultrasound guidance. I was unsuccessful with inadequate blood return and hematoma formation.  Medications Ordered in ED Medications  aztreonam (AZACTAM) 1 g in dextrose 5 % 50 mL  IVPB (0 g Intravenous Stopped 02/19/16 2312)  morphine 2 MG/ML injection 2 mg (2 mg Intravenous Given 02/19/16 2038)  levofloxacin (LEVAQUIN) IVPB 750 mg (0 mg Intravenous Stopped 02/19/16 2250)  vancomycin (VANCOCIN) IVPB 1000 mg/200 mL premix (0 mg Intravenous Stopped 02/19/16 2223)  sodium chloride 0.9 % bolus 2,000 mL (0 mLs Intravenous Stopped 02/19/16 2254)     Initial Impression / Assessment and Plan / ED Course  I have reviewed the triage vital signs and the nursing notes.  Pertinent labs & imaging results that were available during my care of the patient were reviewed by me and considered in my medical decision making (see chart for details).  Clinical Course   Consult: [facility] hospitalist for admission.  Final Clinical Impressions(s) / ED Diagnoses   Final diagnoses:  Generalized weakness  SIRS (systemic inflammatory response syndrome) ([place])   Patient has been having declining general health for several weeks. Has had several recent hospitalizations. At this time, patient presents with tachycardia and hypothermia. Patient treatment has been for suspected sepsis. At this time, exact source is uncertain. Patient has had abdominal pain complaints however recent CT scan did not show acute intra-abdominal pathology. For my examination, there is some mild to moderate left lower quadrant tenderness but exam is nonsurgical. Empiric treatment has been initiated and the patient will be admitted for ongoing treatment and diagnostic evaluation. New Prescriptions New Prescriptions   No medications on file     Charlesetta Shanks, MD 02/20/16 0000    Charlesetta Shanks, MD 02/20/16 0004    Charlesetta Shanks, MD 02/20/16 [id number]

## 2016-02-19 NOTE — ED Triage Notes (Signed)
Pt from home by EMS c/o abdominal pain. Per family member, pt has been admitted twice in the past couple of months for bradycardia then elevated white blood cell count. Pt has had various medication changes for bradycardia and blood pressure medications per EMS. Pt reports bilateral leg pain last night and generalized abdominal pain today associated with bowel and urinary incontinence. Pt warm to the touch

## 2016-02-20 ENCOUNTER — Inpatient Hospital Stay (HOSPITAL_COMMUNITY): Payer: Medicare HMO

## 2016-02-20 ENCOUNTER — Encounter (HOSPITAL_COMMUNITY): Payer: Self-pay | Admitting: Internal Medicine

## 2016-02-20 DIAGNOSIS — T68XXXA Hypothermia, initial encounter: Secondary | ICD-10-CM | POA: Diagnosis present

## 2016-02-20 DIAGNOSIS — I480 Paroxysmal atrial fibrillation: Secondary | ICD-10-CM

## 2016-02-20 DIAGNOSIS — T68XXXD Hypothermia, subsequent encounter: Secondary | ICD-10-CM

## 2016-02-20 DIAGNOSIS — I10 Essential (primary) hypertension: Secondary | ICD-10-CM

## 2016-02-20 DIAGNOSIS — R531 Weakness: Secondary | ICD-10-CM

## 2016-02-20 LAB — CBC WITH DIFFERENTIAL/PLATELET
Basophils Absolute: 0 10*3/uL (ref 0.0–0.1)
Basophils Relative: 0 %
Eosinophils Absolute: 0.1 10*3/uL (ref 0.0–0.7)
Eosinophils Relative: 1 %
HCT: 36.6 % (ref 36.0–46.0)
Hemoglobin: 11.2 g/dL — ABNORMAL LOW (ref 12.0–15.0)
Lymphocytes Relative: 18 %
Lymphs Abs: 1.9 10*3/uL (ref 0.7–4.0)
MCH: 28 pg (ref 26.0–34.0)
MCHC: 30.6 g/dL (ref 30.0–36.0)
MCV: 91.5 fL (ref 78.0–100.0)
Monocytes Absolute: 0.9 10*3/uL (ref 0.1–1.0)
Monocytes Relative: 9 %
Neutro Abs: 7.5 10*3/uL (ref 1.7–7.7)
Neutrophils Relative %: 72 %
Platelets: 260 10*3/uL (ref 150–400)
RBC: 4 MIL/uL (ref 3.87–5.11)
RDW: 14.5 % (ref 11.5–15.5)
WBC: 10.5 10*3/uL (ref 4.0–10.5)

## 2016-02-20 LAB — COMPREHENSIVE METABOLIC PANEL
ALT: 45 U/L (ref 14–54)
AST: 35 U/L (ref 15–41)
Albumin: 3 g/dL — ABNORMAL LOW (ref 3.5–5.0)
Alkaline Phosphatase: 69 U/L (ref 38–126)
Anion gap: 10 (ref 5–15)
BUN: 12 mg/dL (ref 6–20)
CO2: 22 mmol/L (ref 22–32)
Calcium: 8.6 mg/dL — ABNORMAL LOW (ref 8.9–10.3)
Chloride: 110 mmol/L (ref 101–111)
Creatinine, Ser: 0.81 mg/dL (ref 0.44–1.00)
GFR calc Af Amer: >60 mL/min (ref >60)
GFR calc non Af Amer: >60 mL/min (ref >60)
Glucose, Bld: 99 mg/dL (ref 65–99)
Potassium: 3.8 mmol/L (ref 3.5–5.1)
Sodium: 142 mmol/L (ref 135–145)
Total Bilirubin: 0.6 mg/dL (ref 0.3–1.2)
Total Protein: 6.1 g/dL — ABNORMAL LOW (ref 6.5–8.1)

## 2016-02-20 LAB — CK: Total CK: 48 U/L (ref 38–234)

## 2016-02-20 LAB — CULTURE, BLOOD (ROUTINE X 2)
Culture: NO GROWTH
Culture: NO GROWTH

## 2016-02-20 LAB — I-STAT CG4 LACTIC ACID, ED: Lactic Acid, Venous: 0.69 mmol/L (ref 0.5–1.9)

## 2016-02-20 LAB — MRSA PCR SCREENING: MRSA by PCR: NEGATIVE

## 2016-02-20 LAB — CORTISOL: Cortisol, Plasma: 9.6 ug/dL

## 2016-02-20 LAB — PROCALCITONIN: Procalcitonin: <0.1 ng/mL

## 2016-02-20 LAB — TROPONIN I: Troponin I: 0.06 ng/mL (ref <0.03)

## 2016-02-20 LAB — TSH: TSH: 1.963 u[IU]/mL (ref 0.350–4.500)

## 2016-02-20 MED ORDER — DILTIAZEM HCL ER COATED BEADS 120 MG PO CP24
120.0000 mg | ORAL_CAPSULE | Freq: Every day | ORAL | Status: DC
  Administered 2016-02-20 – 2016-02-23 (×4): 120 mg via ORAL
  Filled 2016-02-20 (×4): qty 1

## 2016-02-20 MED ORDER — VITAMIN B-1 100 MG PO TABS
100.0000 mg | ORAL_TABLET | Freq: Every day | ORAL | Status: DC
  Administered 2016-02-20 – 2016-02-23 (×4): 100 mg via ORAL
  Filled 2016-02-20 (×4): qty 1

## 2016-02-20 MED ORDER — ONDANSETRON HCL 4 MG/2ML IJ SOLN: 4.0000 mg | Freq: Four times a day (QID) | INTRAMUSCULAR | Status: DC | PRN

## 2016-02-20 MED ORDER — VANCOMYCIN HCL IN DEXTROSE 750-5 MG/150ML-% IV SOLN
750.0000 mg | INTRAVENOUS | Status: DC
  Administered 2016-02-20: 750 mg via INTRAVENOUS
  Filled 2016-02-20: qty 150

## 2016-02-20 MED ORDER — HYDRALAZINE HCL 20 MG/ML IJ SOLN: 10.0000 mg | Freq: Four times a day (QID) | INTRAMUSCULAR | Status: DC | PRN

## 2016-02-20 MED ORDER — FAMOTIDINE 20 MG PO TABS
20.0000 mg | ORAL_TABLET | Freq: Every day | ORAL | Status: DC
  Administered 2016-02-20 – 2016-02-23 (×4): 20 mg via ORAL
  Filled 2016-02-20 (×4): qty 1

## 2016-02-20 MED ORDER — MIRTAZAPINE 15 MG PO TABS
15.0000 mg | ORAL_TABLET | Freq: Every day | ORAL | Status: DC
  Administered 2016-02-20 – 2016-02-22 (×3): 15 mg via ORAL
  Filled 2016-02-20 (×4): qty 1

## 2016-02-20 MED ORDER — FLUTICASONE FUROATE-VILANTEROL 100-25 MCG/INH IN AEPB
2.0000 | INHALATION_SPRAY | Freq: Two times a day (BID) | RESPIRATORY_TRACT | Status: DC
  Administered 2016-02-21: 2 via RESPIRATORY_TRACT
  Administered 2016-02-21 – 2016-02-23 (×4): 1 via RESPIRATORY_TRACT
  Filled 2016-02-20: qty 28

## 2016-02-20 MED ORDER — SODIUM CHLORIDE 0.9 % IV SOLN
INTRAVENOUS | Status: AC
  Administered 2016-02-20: 03:00:00 via INTRAVENOUS

## 2016-02-20 MED ORDER — AMLODIPINE BESYLATE 10 MG PO TABS
10.0000 mg | ORAL_TABLET | Freq: Every day | ORAL | Status: DC
  Administered 2016-02-21 – 2016-02-23 (×3): 10 mg via ORAL
  Filled 2016-02-20 (×3): qty 1

## 2016-02-20 MED ORDER — ALBUTEROL SULFATE (2.5 MG/3ML) 0.083% IN NEBU
2.5000 mg | INHALATION_SOLUTION | Freq: Two times a day (BID) | RESPIRATORY_TRACT | Status: DC | PRN
Start: 2016-02-20 — End: 2016-02-23

## 2016-02-20 MED ORDER — LEVOFLOXACIN IN D5W 750 MG/150ML IV SOLN: 750.0000 mg | INTRAVENOUS | Status: DC

## 2016-02-20 MED ORDER — ENOXAPARIN SODIUM 40 MG/0.4ML ~~LOC~~ SOLN: 40.0000 mg | SUBCUTANEOUS | Status: DC

## 2016-02-20 MED ORDER — DULOXETINE HCL 60 MG PO CPEP
60.0000 mg | ORAL_CAPSULE | Freq: Every day | ORAL | Status: DC
  Administered 2016-02-21 – 2016-02-23 (×3): 60 mg via ORAL
  Filled 2016-02-20 (×4): qty 1

## 2016-02-20 MED ORDER — ACETAMINOPHEN 325 MG PO TABS: 650.0000 mg | ORAL_TABLET | Freq: Four times a day (QID) | ORAL | Status: DC | PRN

## 2016-02-20 MED ORDER — ACETAMINOPHEN 650 MG RE SUPP: 650.0000 mg | Freq: Four times a day (QID) | RECTAL | Status: DC | PRN

## 2016-02-20 MED ORDER — PANTOPRAZOLE SODIUM 40 MG PO TBEC
40.0000 mg | DELAYED_RELEASE_TABLET | Freq: Every day | ORAL | Status: DC
  Administered 2016-02-20 – 2016-02-23 (×4): 40 mg via ORAL
  Filled 2016-02-20 (×4): qty 1

## 2016-02-20 MED ORDER — BENZONATATE 100 MG PO CAPS
100.0000 mg | ORAL_CAPSULE | Freq: Three times a day (TID) | ORAL | Status: DC | PRN
  Administered 2016-02-21: 100 mg via ORAL
  Filled 2016-02-20 (×2): qty 1

## 2016-02-20 MED ORDER — RISAQUAD PO CAPS
1.0000 | ORAL_CAPSULE | Freq: Every day | ORAL | Status: DC
  Administered 2016-02-21 – 2016-02-23 (×3): 1 via ORAL
  Filled 2016-02-20 (×4): qty 1

## 2016-02-20 MED ORDER — FOLIC ACID 1 MG PO TABS
1.0000 mg | ORAL_TABLET | Freq: Every day | ORAL | Status: DC
Start: 2016-02-20 — End: 2016-02-23
  Administered 2016-02-20 – 2016-02-23 (×4): 1 mg via ORAL
  Filled 2016-02-20 (×4): qty 1

## 2016-02-20 MED ORDER — ATORVASTATIN CALCIUM 10 MG PO TABS
10.0000 mg | ORAL_TABLET | Freq: Every day | ORAL | Status: DC
  Administered 2016-02-20 – 2016-02-22 (×3): 10 mg via ORAL
  Filled 2016-02-20 (×4): qty 1

## 2016-02-20 MED ORDER — ASPIRIN 81 MG PO CHEW
81.0000 mg | CHEWABLE_TABLET | Freq: Every day | ORAL | Status: DC
  Administered 2016-02-20 – 2016-02-23 (×4): 81 mg via ORAL
  Filled 2016-02-20 (×4): qty 1

## 2016-02-20 MED ORDER — DONEPEZIL HCL 10 MG PO TABS
10.0000 mg | ORAL_TABLET | Freq: Every day | ORAL | Status: DC
  Administered 2016-02-20 – 2016-02-23 (×4): 10 mg via ORAL
  Filled 2016-02-20 (×3): qty 1
  Filled 2016-02-20: qty 2

## 2016-02-20 MED ORDER — ONDANSETRON HCL 4 MG PO TABS: 4.0000 mg | ORAL_TABLET | Freq: Four times a day (QID) | ORAL | Status: DC | PRN

## 2016-02-20 MED ORDER — CLOBETASOL PROPIONATE 0.05 % EX CREA
1.0000 "application " | TOPICAL_CREAM | Freq: Two times a day (BID) | CUTANEOUS | Status: DC | PRN
  Filled 2016-02-20: qty 15

## 2016-02-20 NOTE — ED Notes (Signed)
Dr.Kakrakandy ( admitting MD) notified on elevated Troponin result.

## 2016-02-20 NOTE — ED Notes (Signed)
SLP at bedside.

## 2016-02-20 NOTE — Progress Notes (Signed)
Pt seen and examined, admitted earlier this am by Dr.Kakrakandy Came with weakness and some vague abd pain Was hypothermic on arrival to the ED, temp normalized after IVF No evidence of sepsis, procalcitonin <0.10, lactate normal, TSH and labs normal Denies any abd pain now, exam benign, recent CT 10/3 was unremarkable SLP eval ordered by admitter -mild dysphagia noted, started on D1 diet Stop IVF, Stop Abx, Pt/OT eval Attempted to contact daughter x2 unable to reach her today, will try again tomorrow  Domenic Polite, MD

## 2016-02-20 NOTE — ED Notes (Signed)
Pharmacist notified on pt.'s Azactam IVPB order.

## 2016-02-20 NOTE — ED Notes (Signed)
PT at bedside with patient

## 2016-02-20 NOTE — Progress Notes (Signed)
Pharmacy Antibiotic Note  Tammy [name] is a 82 y.o. female admitted on 02/19/2016 with sepsis.  Pharmacy has been consulted for Vancomycin, Aztreonam, and Levaquin dosing. Pt with recent hospitalization for AMS and acute renal failure- d/c 10/3.  Pt received Aztreonam 1gm, Levaquin 750mg  and Vanc 1gm ~2200 in ED  Plan: Aztreonam 1gm IV q8h Levaquin 750mg  IV q48h Vancomycin 750mg  IV q24h Will f/u micro data, renal function, and pt's clinical condition Vanc trough prn   Height: 5' (152.4 cm) Weight: 124 lb (56.2 kg) IBW/kg (Calculated) : 45.5  Temp (24hrs), Avg:97.1 F (36.2 C), Min:95.9 F (35.5 C), Max:98.2 F (36.8 C)   Recent Labs Lab 02/14/16 1727 02/14/16 1820 02/15/16 0157 02/15/16 0434 02/16/16 0905 02/19/16 2058 02/19/16 2108 02/20/16 0035  WBC 14.0*  --  11.5*  --   --  10.9*  --   --   CREATININE 1.05*  --  1.20*  --  1.21* 0.92  --   --   LATICACIDVEN  --  2.86* 1.1 0.9  --   --  1.01 0.69    Estimated Creatinine Clearance: 36.4 mL/min (by C-G formula based on SCr of 0.92 mg/dL).    Allergies  Allergen Reactions  . Penicillins Hives    Has patient had a PCN reaction causing immediate rash, facial/tongue/throat swelling, SOB or lightheadedness with hypotension: No Has patient had a PCN reaction causing severe rash involving mucus membranes or skin necrosis: Yes Has patient had a PCN reaction that required hospitalization No Has patient had a PCN reaction occurring within the last 10 years: Yes took again last year 2016 If all of the above answers are "NO", then may proceed with Cephalosporin use.   . Lactose Intolerance (Gi) Other (See Comments)    Unknown- [facility] documented    Antimicrobials this admission: 10/5 Vanc >>  10/5 Aztreonam >>  10/5 Levaquin >>  Dose adjustments this admission: n/a  Microbiology results: 10/5 BCx x2:  10/5 UCx:    Thank you for allowing pharmacy to be a part of this patient's  care.  Sherlon Handing, PharmD, BCPS Clinical pharmacist, pager [phone] 02/20/2016 2:51 AM

## 2016-02-20 NOTE — ED Notes (Signed)
Admitting MD at bedside.

## 2016-02-20 NOTE — H&P (Signed)
History and Physical    Tammy [name] [id number] DOB: 1930/06/24 DOA: 02/19/2016  PCP:  Melinda Crutch, MD  Patient coming from: Home.  Chief Complaint: Weakness.  History obtained from patient's daughter and ER physician and previous records.  I have reviewed patient's previous records in [place].  HPI: Tammy [name] is a 82 y.o. female with atrial fibrillation not a candidate for anticoagulation secondary to risk of fall and was recently admitted for acute encephalopathy prior to that was admitted for bradycardia at that time beta blockers were discontinued was brought to the ER after patient was found to be weak and confused and complaining of abdominal discomfort. Patient was discharged 2 days ago following which patient's daughter stated that patient has hardly gotten out of bed because of weakness. Patient denies any chest pain shortness of breath nausea vomiting or diarrhea. In the ER patient was found to be nonfocal but generally weak. Was hypothermic with temperatures running around 25F and patient was complaining of chills and rigors. Patient has mild tremors in the left upper extremity. As per patient's daughter patient drinks alcohol a lot but since that admission in August patient has not had any alcohol. As patient was hypothermic blood cultures were obtained and start on empiric antibiotics. Patient is being admitted for further management and may need rehabilitation placement given the generalized weakness and patient living alone.  ED Course: Patient was started on empiric antibiotics and fluids after blood cultures obtained.  Review of Systems: As per HPI, rest all negative.   Past Medical History:  Diagnosis Date  . Acid reflux   . Asthma    Asthmatic COPD  . Atrial fibrillation ([place]) 01/01/2016  . Coronary artery disease    Echo 07/27/2011   . Coronary artery stenosis    , High-grade left main  . GERD (gastroesophageal reflux disease)   . Hyperlipidemia   .  Hypertension   . IBS (irritable bowel syndrome)   . Psoriasis     Past Surgical History:  Procedure Laterality Date  . CARDIAC SURGERY    . CORONARY ARTERY BYPASS GRAFT  1994   After catheterization showed 90% ostial left main stenosis  . DOPPLER ECHOCARDIOGRAPHY     Study showed mild to moderate MR with moderate TR, mild to moderate pulmonary hypertension with an ejection fraction greater that 55%.   [name] EYE SURGERY    . Nuclear Perfusion  10/2010, 11/15/2012   Showed normal perfusion, No Ischemia or Infarction, Normal EF  . Renal Scan Duplex  04/09/2012   Showing greater than 50% diameter reduction in the celiac artery and SMA.. Right proximal 275 and Mid 152.  [name] ROTATOR CUFF REPAIR     bilateral  . Sleep Study  12/28/2006   AHI during total sleep time (3h 19 minutes) was 1.81/hr and during REM sleep at 17.78/hr. Mild sleep apnea during REM sleep.Oxygen staturated rate during REM and NREM was 93.0%.     reports that she quit smoking about 53 years ago. She has never used smokeless tobacco. She reports that she drinks alcohol. She reports that she does not use drugs.  Allergies  Allergen Reactions  . Penicillins Hives    Has patient had a PCN reaction causing immediate rash, facial/tongue/throat swelling, SOB or lightheadedness with hypotension: No Has patient had a PCN reaction causing severe rash involving mucus membranes or skin necrosis: Yes Has patient had a PCN reaction that required hospitalization No Has patient had a PCN reaction occurring within the  last 10 years: Yes took again last year 2016 If all of the above answers are "NO", then may proceed with Cephalosporin use.   . Lactose Intolerance (Gi) Other (See Comments)    Unknown- [facility] documented    Family History  Problem Relation Age of Onset  . Heart disease Mother   . Hypertension Sister     Prior to Admission medications   Medication Sig Start Date End Date Taking? Authorizing  Provider  acetaminophen (TYLENOL) 500 MG tablet Take 1,000 mg by mouth 2 (two) times daily.    Yes Historical Provider, MD  aspirin 81 MG tablet Take 81 mg by mouth daily.    Yes Historical Provider, MD  benzonatate (TESSALON) 100 MG capsule Take 100 mg by mouth every 8 (eight) hours as needed for cough.   Yes Historical Provider, MD  clobetasol cream (TEMOVATE) [id number] % Apply 1 application topically 2 (two) times daily as needed (itching and inflammation).   Yes Historical Provider, MD  diltiazem (CARDIZEM CD) 120 MG 24 hr capsule Take 1 capsule (120 mg total) by mouth daily. 01/06/16  Yes Ripudeep Krystal Eaton, MD  donepezil (ARICEPT) 10 MG tablet Take 1 tablet (10 mg total) by mouth daily. 07/13/14  Yes Marcial Pacas, MD  DULoxetine (CYMBALTA) 60 MG capsule Take 1 capsule by mouth daily. Reported on 08/05/2015 09/13/14  Yes Historical Provider, MD  fluticasone furoate-vilanterol (BREO ELLIPTA) 100-25 MCG/INH AEPB Inhale 2 puffs into the lungs 2 (two) times daily.   Yes Historical Provider, MD  folic acid (FOLVITE) 1 MG tablet Take 1 tablet (1 mg total) by mouth daily. 01/06/16  Yes Ripudeep Krystal Eaton, MD  mirtazapine (REMERON) 15 MG tablet Take 15 mg by mouth at bedtime. Reported on 08/05/2015   Yes Historical Provider, MD  omeprazole (PRILOSEC) 20 MG capsule Take 1 capsule (20 mg total) by mouth 2 (two) times daily before a meal. 08/05/15  Yes Jiles Prows, MD  Probiotic Product (PROBIOTIC DAILY) CAPS Take 1 capsule by mouth daily.   Yes Historical Provider, MD  ranitidine (ZANTAC) 300 MG tablet Take 1 tablet (300 mg total) by mouth at bedtime. 07/21/15  Yes [name], MD  thiamine 100 MG tablet Take 1 tablet (100 mg total) by mouth daily. 01/06/16  Yes Ripudeep Krystal Eaton, MD  VENTOLIN HFA 108 (90 BASE) MCG/ACT inhaler Inhale 1 puff into the lungs every 12 (twelve) hours as needed for shortness of breath.  11/27/12  Yes Historical Provider, MD  atorvastatin (LIPITOR) 10 MG tablet Take 1 tablet (10 mg total) by mouth at  bedtime. 01/06/16   Ripudeep Krystal Eaton, MD  haloperidol (HALDOL) 2 MG tablet Take 1 tablet (2 mg total) by mouth every 8 (eight) hours as needed for agitation. Patient not taking: Reported on 02/19/2016 02/17/16   Hosie Poisson, MD    Physical Exam: Vitals:   02/19/16 2315 02/19/16 2330 02/19/16 2345 02/20/16 0000  BP: 165/78 161/73 151/70 162/74  Pulse: 108 103 96 104  Resp: 12 20 18 20   Temp:      TempSrc:      SpO2: 94% 93% 93% 94%  Weight:      Height:          Constitutional: Moderately built and nourished. Vitals:   02/19/16 2315 02/19/16 2330 02/19/16 2345 02/20/16 0000  BP: 165/78 161/73 151/70 162/74  Pulse: 108 103 96 104  Resp: 12 20 18 20   Temp:      TempSrc:  SpO2: 94% 93% 93% 94%  Weight:      Height:       Eyes: Anicteric no pallor. ENMT: No discharge from the ears eyes nose or mouth. Neck: No neck rigidity. No mass felt. No JVD appreciated. Respiratory: No rhonchi or crepitations. Bilateral alar depression. Cardiovascular: S1-S2 heard no murmur appreciated. Abdomen: Soft nontender bowel sounds present. No guarding or rigidity. Musculoskeletal: No edema. No joint effusion. Skin: Chronic skin changes. Skin appears warm. Neurologic: Moves all extremities but generally weak. No facial asymmetry. PERRLA positive. Psychiatric: Alert awake oriented to time place and person.   Labs on Admission: I have personally reviewed following labs and imaging studies  CBC:  Recent Labs Lab 02/14/16 1727 02/15/16 0157 02/19/16 2058  WBC 14.0* 11.5* 10.9*  NEUTROABS 11.0*  --  8.1*  HGB 13.5 11.5* 12.0  HCT 40.3 34.2* 38.5  MCV 85.7 85.3 91.4  PLT 434* 380 [id number]   Basic Metabolic Panel:  Recent Labs Lab 02/14/16 1727 02/15/16 0157 02/16/16 0905 02/19/16 2058  NA 132* 130* 133* 143  K 4.0 3.6 3.8 4.0  CL 95* 94* 98* 112*  CO2 23 27 27  21*  GLUCOSE 108* 131* 143* 100*  BUN 19 25* 21* 15  CREATININE 1.05* 1.20* 1.21* 0.92  CALCIUM 9.6 8.5* 8.8* 9.0  MG  1.7  --   --   --    GFR: Estimated Creatinine Clearance: 36.4 mL/min (by C-G formula based on SCr of 0.92 mg/dL). Liver Function Tests:  Recent Labs Lab 02/14/16 1727 02/19/16 2058  AST 26 46*  ALT 19 52  ALKPHOS 90 75  BILITOT 0.6 0.4  PROT 8.5* 6.6  ALBUMIN 4.5 3.5    Recent Labs Lab 02/19/16 2058  LIPASE 47    Recent Labs Lab 02/15/16 0157  AMMONIA 23   Coagulation Profile:  Recent Labs Lab 02/15/16 0157  INR 0.93   Cardiac Enzymes:  Recent Labs Lab 02/19/16 2058  TROPONINI <0.03   BNP (last 3 results) No results for input(s): PROBNP in the last 8760 hours. HbA1C: No results for input(s): HGBA1C in the last 72 hours. CBG:  Recent Labs Lab 02/15/16 0809 02/16/16 0809 02/17/16 0745  GLUCAP 102* 108* 99   Lipid Profile: No results for input(s): CHOL, HDL, LDLCALC, TRIG, CHOLHDL, LDLDIRECT in the last 72 hours. Thyroid Function Tests: No results for input(s): TSH, T4TOTAL, FREET4, T3FREE, THYROIDAB in the last 72 hours. Anemia Panel: No results for input(s): VITAMINB12, FOLATE, FERRITIN, TIBC, IRON, RETICCTPCT in the last 72 hours. Urine analysis:    Component Value Date/Time   COLORURINE YELLOW 02/19/2016 2220   APPEARANCEUR CLEAR 02/19/2016 2220   LABSPEC 1.019 02/19/2016 2220   PHURINE 6.0 02/19/2016 2220   GLUCOSEU NEGATIVE 02/19/2016 2220   HGBUR NEGATIVE 02/19/2016 2220   BILIRUBINUR NEGATIVE 02/19/2016 2220   KETONESUR 15 (A) 02/19/2016 2220   PROTEINUR NEGATIVE 02/19/2016 2220   UROBILINOGEN 0.2 07/11/2009 2220   NITRITE NEGATIVE 02/19/2016 2220   LEUKOCYTESUR NEGATIVE 02/19/2016 2220   Sepsis Labs: @LABRCNTIP (procalcitonin:4,lacticidven:4) ) Recent Results (from the past 240 hour(s))  Urine culture     Status: Abnormal   Collection Time: 02/14/16  4:32 PM  Result Value Ref Range Status   Specimen Description URINE, CLEAN CATCH  Final   Special Requests NONE  Final   Culture MULTIPLE SPECIES PRESENT, SUGGEST RECOLLECTION  (A)  Final   Report Status 02/16/2016 FINAL  Final  Culture, blood (x 2)     Status: None (Preliminary result)  Collection Time: 02/15/16  1:43 AM  Result Value Ref Range Status   Specimen Description BLOOD LEFT ARM  Final   Special Requests IN PEDIATRIC BOTTLE 2CC  Final   Culture   Final    NO GROWTH 4 DAYS Performed at [facility]    Report Status PENDING  Incomplete  Culture, blood (x 2)     Status: None (Preliminary result)   Collection Time: 02/15/16  1:48 AM  Result Value Ref Range Status   Specimen Description BLOOD LEFT ARM  Final   Special Requests BOTTLES DRAWN AEROBIC ONLY 5 CC  Final   Culture   Final    NO GROWTH 4 DAYS Performed at [facility]    Report Status PENDING  Incomplete     Radiological Exams on Admission: Dg Chest Port 1 View  Result Date: 02/19/2016 CLINICAL DATA:  82 y/o F; pain, weakness, and fever for several days. EXAM: PORTABLE CHEST 1 VIEW COMPARISON:  02/14/2016 chest radiograph. FINDINGS: Stable cardiomediastinal silhouette given differences in technique. Aortic atherosclerosis with arch calcifications. Status post CABG. Sternotomy wires are aligned. No acute osseous abnormality is identified. Low lung volumes with prominent pulmonary markings. Patchy opacities at lung bases probably represent atelectasis. Pulmonary venous hypertension. IMPRESSION: Low lung volumes. Bibasilar atelectasis. Pulmonary venous hypertension. No acute process identified. Electronically Signed   By: Kristine Garbe M.D.   On: 02/19/2016 21:03    EKG: Independently reviewed. Sinus tachycardia with nonspecific T-wave changes.  Assessment/Plan Principal Problem:   Hypothermia Active Problems:   Essential hypertension   Pulmonary hypertension   Weakness generalized   PAF (paroxysmal atrial fibrillation) (HCC)   SIRS (systemic inflammatory response syndrome) (HCC)    1. Generalized weakness - patient is nonfocal and CT head and neck is  unremarkable. Patient does have significant weakness. Check CK levels TSH. Will consult physical therapy and continue hydration. May need placement. Symptoms may be related to depression or worsening dementia. 2. Hypothermia - cause not clear. Will check cortisol levels TSH blood cultures. At this time patient is placed on empiric antibiotics which can be discontinued if patient's temperature improves and procalcitonin is negative. 3. Abdominal discomfort - on exam patient's abdomen appears benign. Will check KUB. 4. Atrial fibrillation - chest 2 vasc score is 6 but patient is at risk for falls so not on anticoagulation. Continue Cardizem. Has been off metoprolol secondary to bradycardia. 5. CAD - denies any chest pain continue aspirin and statins. 6. Dementia on Aricept. 7. Depression on Cymbalta.  Since patient has significant weakness and hypothermia and is at risk for falls will require physical therapy and possible placement patient has been admitted as inpatient status.   DVT prophylaxis: Lovenox. Code Status: Full code.  Family Communication: Patient's daughter.  Disposition Plan: To be determined.  Consults called: Physical therapy and social work.  Admission status: Inpatient. Likely stay 2 days.    Rise Patience MD [facility] Pager [phone].  If 7PM-7AM, please contact night-coverage [email or web address] Password [email or web address]  02/20/2016, 2:43 AM

## 2016-02-20 NOTE — ED Notes (Signed)
Called SLP for bedside swallow eval. Will assess pt ASAP.

## 2016-02-20 NOTE — ED Notes (Signed)
Pt taken to xray 

## 2016-02-20 NOTE — Evaluation (Signed)
Clinical/Bedside Swallow Evaluation Patient Details  Name: Tammy [name] MRN: [id number] Date of Birth: [birth year]  Today's Date: 02/20/2016 Time: SLP Start Time (ACUTE ONLY): 1032 SLP Stop Time (ACUTE ONLY): 1043 SLP Time Calculation (min) (ACUTE ONLY): 11 min  Past Medical History:  Past Medical History:  Diagnosis Date  . Acid reflux   . Asthma    Asthmatic COPD  . Atrial fibrillation ([place]) 01/01/2016  . Coronary artery disease    Echo 07/27/2011   . Coronary artery stenosis    , High-grade left main  . GERD (gastroesophageal reflux disease)   . Hyperlipidemia   . Hypertension   . IBS (irritable bowel syndrome)   . Psoriasis    Past Surgical History:  Past Surgical History:  Procedure Laterality Date  . CARDIAC SURGERY    . CORONARY ARTERY BYPASS GRAFT  1994   After catheterization showed 90% ostial left main stenosis  . DOPPLER ECHOCARDIOGRAPHY     Study showed mild to moderate MR with moderate TR, mild to moderate pulmonary hypertension with an ejection fraction greater that 55%.   [name] EYE SURGERY    . Nuclear Perfusion  10/2010, 11/15/2012   Showed normal perfusion, No Ischemia or Infarction, Normal EF  . Renal Scan Duplex  04/09/2012   Showing greater than 50% diameter reduction in the celiac artery and SMA.. Right proximal 275 and Mid 152.  [name] ROTATOR CUFF REPAIR     bilateral  . Sleep Study  12/28/2006   AHI during total sleep time (3h 19 minutes) was 1.81/hr and during REM sleep at 17.78/hr. Mild sleep apnea during REM sleep.Oxygen staturated rate during REM and NREM was 93.0%.   HPI:  82 yo female who presents with generalized weakness, AMS, hypothermia, and abdominal discomfort. Pt had recent admission for encephalopathy and prior to that for bradycardia. PMH: GERD, CAD, a fib, asthma    Assessment / Plan / Recommendation Clinical Impression  Pt has slow oral preparation and mild-moderate residual with regular textures, likely due to mentation and generalized  weakness. She does not have any overt s/s of aspiration. Recommend Dys 1 diet and thin liquids with full supervision. Will f/u for possible advancement as mentation clears.    Aspiration Risk  Mild aspiration risk    Diet Recommendation Dysphagia 1 (Puree);Thin liquid   Liquid Administration via: Cup;Straw Medication Administration: Crushed with puree Supervision: Staff to assist with self feeding;Full supervision/cueing for compensatory strategies Compensations: Minimize environmental distractions;Slow rate;Small sips/bites;Follow solids with liquid Postural Changes: Seated upright at 90 degrees;Remain upright for at least 30 minutes after po intake    Other  Recommendations Oral Care Recommendations: Oral care BID   Follow up Recommendations 24 hour supervision/assistance      Frequency and Duration min 2x/week  2 weeks       Prognosis Prognosis for Safe Diet Advancement: Fair Barriers to Reach Goals: Cognitive deficits      Swallow Study   General HPI: 82 yo female who presents with generalized weakness, AMS, hypothermia, and abdominal discomfort. Pt had recent admission for encephalopathy and prior to that for bradycardia. PMH: GERD, CAD, a fib, asthma  Type of Study: Bedside Swallow Evaluation Previous Swallow Assessment: none in chart Diet Prior to this Study: NPO Temperature Spikes Noted: No Respiratory Status: Room air History of Recent Intubation: No Behavior/Cognition: Alert;Cooperative;Requires cueing Oral Cavity Assessment: Dry Oral Care Completed by SLP: No Oral Cavity - Dentition: Adequate natural dentition Self-Feeding Abilities: Total assist Patient Positioning: Upright in bed Baseline Vocal  Quality: Low vocal intensity    Oral/Motor/Sensory Function Overall Oral Motor/Sensory Function: Generalized oral weakness   Ice Chips Ice chips: Within functional limits Presentation: Spoon   Thin Liquid Thin Liquid: Within functional limits Presentation: Straw     Nectar Thick Nectar Thick Liquid: Not tested   Honey Thick Honey Thick Liquid: Not tested   Puree Puree: Impaired Presentation: Spoon Oral Phase Functional Implications: Prolonged oral transit   Solid   GO   Solid: Impaired Oral Phase Impairments: Impaired mastication;Poor awareness of bolus Oral Phase Functional Implications: Prolonged oral transit;Impaired mastication;Oral residue        Germain Osgood 02/20/2016,12:10 PM  Germain Osgood, M.A. CCC-SLP [phone]

## 2016-02-20 NOTE — Evaluation (Signed)
Physical Therapy Evaluation Patient Details Name: Tammy [name] MRN: [id number] DOB: Apr 13, 1931 Today's Date: 02/20/2016   History of Present Illness  Tammy [name] is a 82 y.o. female with atrial fibrillation not a candidate for anticoagulation secondary to risk of fall and was recently admitted for acute encephalopathy prior to that was admitted for bradycardia at that time beta blockers were discontinued was brought to the ER after patient was found to be weak and confused and complaining of abdominal discomfort.   PMH positive for GERD, Asthma, COPD, CAD, HTN, HLD, A-fib and IBS.  Clinical Impression  Patient presents with decreased mobility due to deficits listed in PT problem list (mainly limited activity tolerance, cognition and balance).  She will benefit from skilled PT in the acute setting to allow return home with intermittent assist following SNF level rehab stay.     Follow Up Recommendations SNF (pt prefers not to return to [place])    Equipment Recommendations  None recommended by PT    Recommendations for Other Services       Precautions / Restrictions Precautions Precautions: Fall      Mobility  Bed Mobility Overal bed mobility: Needs Assistance Bed Mobility: Supine to Sit;Sit to Supine     Supine to sit: Max assist Sit to supine: Max assist   General bed mobility comments: on/off stretcher in ED, assist for legs off bed and trunk upright, then for legs into bed and positioning in bed  Transfers Overall transfer level: Needs assistance Equipment used: Rolling walker (2 wheeled) Transfers: Sit to/from Stand   Stand pivot transfers: Mod assist       General transfer comment: up from high bed and from chair with lifting help  Ambulation/Gait Ambulation/Gait assistance: Min assist;Mod assist Ambulation Distance (Feet): 90 Feet Assistive device: Rolling walker (2 wheeled) Gait Pattern/deviations: Step-through pattern;Narrow base of  support;Decreased stride length;Shuffle     General Gait Details: assist for walker management, for balance, noted SOB with ambulation  Stairs            Wheelchair Mobility    Modified Rankin (Stroke Patients Only)       Balance Overall balance assessment: Needs assistance Sitting-balance support: Feet unsupported Sitting balance-Leahy Scale: Fair     Standing balance support: Bilateral upper extremity supported Standing balance-Leahy Scale: Poor Standing balance comment: UE support needed for balance                             Pertinent Vitals/Pain Pain Assessment: Faces Faces Pain Scale: Hurts even more Pain Location: head Pain Descriptors / Indicators: Headache Pain Intervention(s): Monitored during session;Patient requesting pain meds-RN notified    Home Living Family/patient expects to be discharged to:: Private residence Living Arrangements: Alone Available Help at Discharge: Family (daughter works 60 hours a week) Type of Home: House Home Access: Stairs to enter Entrance Stairs-Rails: Psychiatric nurse of Steps: 3-4 Home Layout: One [place]: Environmental consultant - 4 wheels Additional Comments: daughter reports was at [facility] 4 weeks, then home and was doing well, but has been back in and out of the hospital several times since and hasn't gotten into any routine    Prior Function                 Hand Dominance        Extremity/Trunk Assessment   Upper Extremity Assessment: Defer to OT evaluation (does have upper body/extremity  tremor at times)           Lower Extremity Assessment: RLE deficits/detail;LLE deficits/detail RLE Deficits / Details: AROM WFL, strength at least 3/5, but weakness evident, lower legs tender to touch LLE Deficits / Details: AROM WFL, strength at least 3/5, but weakness evident, lower legs tender to touch  Cervical / Trunk Assessment: Kyphotic  Communication    Communication: HOH  Cognition Arousal/Alertness: Awake/alert Behavior During Therapy: Flat affect Overall Cognitive Status: Impaired/Different from baseline Area of Impairment: Orientation;Following commands;Safety/judgement;Problem solving Orientation Level: Disoriented to;Time;Situation     Following Commands: Follows one step commands with increased time Safety/Judgement: Decreased awareness of safety;Decreased awareness of deficits   Problem Solving: Slow processing;Decreased initiation;Requires verbal cues;Requires tactile cues      General Comments General comments (skin integrity, edema, etc.): pt soiled with urine in bed, assisted up and changed linen    Exercises     Assessment/Plan    PT Assessment Patient needs continued PT services  PT Problem List Decreased strength;Decreased activity tolerance;Decreased balance;Decreased mobility;Decreased cognition;Decreased knowledge of use of DME;Decreased safety awareness          PT Treatment Interventions DME instruction;Gait training;Therapeutic activities;Therapeutic exercise;Balance training;Patient/family education    PT Goals (Current goals can be found in the Care Plan section)  Acute Rehab PT Goals Patient Stated Goal: to go to rehab PT Goal Formulation: With patient/family Time For Goal Achievement: 03/05/16 Potential to Achieve Goals: Fair    Frequency Min 3X/week   Barriers to discharge        Co-evaluation               End of Session Equipment Utilized During Treatment: Gait belt Activity Tolerance: Patient limited by fatigue Patient left: in bed;with family/visitor present;with call bell/phone within reach           Time: 1325-1408 PT Time Calculation (min) (ACUTE ONLY): 43 min   Charges:   PT Evaluation $PT Eval Moderate Complexity: 1 Procedure PT Treatments $Gait Training: 8-22 mins $Therapeutic Activity: 8-22 mins   PT G CodesReginia Naas 03/03/2016, 3:09 PM  Magda Kiel, [place] 03/03/16

## 2016-02-21 LAB — COMPREHENSIVE METABOLIC PANEL
ALT: 33 U/L (ref 14–54)
AST: 24 U/L (ref 15–41)
Albumin: 2.9 g/dL — ABNORMAL LOW (ref 3.5–5.0)
Alkaline Phosphatase: 75 U/L (ref 38–126)
Anion gap: 6 (ref 5–15)
BUN: 11 mg/dL (ref 6–20)
CO2: 22 mmol/L (ref 22–32)
Calcium: 8.7 mg/dL — ABNORMAL LOW (ref 8.9–10.3)
Chloride: 111 mmol/L (ref 101–111)
Creatinine, Ser: 0.84 mg/dL (ref 0.44–1.00)
GFR calc Af Amer: >60 mL/min (ref >60)
GFR calc non Af Amer: >60 mL/min (ref >60)
Glucose, Bld: 108 mg/dL — ABNORMAL HIGH (ref 65–99)
Potassium: 3.6 mmol/L (ref 3.5–5.1)
Sodium: 139 mmol/L (ref 135–145)
Total Bilirubin: 0.4 mg/dL (ref 0.3–1.2)
Total Protein: 5.7 g/dL — ABNORMAL LOW (ref 6.5–8.1)

## 2016-02-21 LAB — CBC
HCT: 33.9 % — ABNORMAL LOW (ref 36.0–46.0)
Hemoglobin: 10.4 g/dL — ABNORMAL LOW (ref 12.0–15.0)
MCH: 28.1 pg (ref 26.0–34.0)
MCHC: 30.7 g/dL (ref 30.0–36.0)
MCV: 91.6 fL (ref 78.0–100.0)
Platelets: 275 10*3/uL (ref 150–400)
RBC: 3.7 MIL/uL — ABNORMAL LOW (ref 3.87–5.11)
RDW: 14.4 % (ref 11.5–15.5)
WBC: 9.7 10*3/uL (ref 4.0–10.5)

## 2016-02-21 LAB — URINE CULTURE: Culture: NO GROWTH

## 2016-02-21 MED ORDER — ENOXAPARIN SODIUM 40 MG/0.4ML ~~LOC~~ SOLN
40.0000 mg | Freq: Every day | SUBCUTANEOUS | Status: DC
  Administered 2016-02-22 – 2016-02-23 (×2): 40 mg via SUBCUTANEOUS
  Filled 2016-02-21 (×2): qty 0.4

## 2016-02-21 NOTE — Progress Notes (Signed)
PROGRESS NOTE    Tammy [name]  [id number] DOB: 1931/05/17 DOA: 02/19/2016 PCP:  Melinda Crutch, MD Brief Narrative: Tammy [name] is a 82 y.o. female with atrial fibrillation not a candidate for anticoagulation secondary to risk of fall and was recently admitted for acute encephalopathy prior to that was admitted for bradycardia at that time beta blockers were discontinued was brought to the ER after patient was found to be weak and confused and complaining of abdominal discomfort. Patient was discharged 2 days ago following which patient's daughter stated that patient has hardly gotten out of bed because of weakness. Patient denies any chest pain shortness of breath nausea vomiting or diarrhea. In the ER patient was found to be nonfocal but generally weak and hypothermic, was started on broad spectrum Abx   Assessment & Plan:   1.  Generalized weakness - patient is nonfocal and CT head and neck is unremarkable. -TSH, cortisol level -no acute illness identified -Pt eval completed, SNF recommended.  2.  Hypothermia -etiology unclear, resolved -stopped Abx -blood cx negative -pro calcitonin <0.10  3. Abdominal discomfort -resolved, exam benign, tolerating diet, CT abd pelvis 10/3: unremarkable  4. Atrial fibrillation - chest 2 vasc score is 6 but patient is at risk for falls so not on anticoagulation. Continue Cardizem  5. HTN -continue cardizem, add amlodipine -hydralazine PRN  6. CAD  - denies any chest pain continue aspirin and statins.  7. Dementia on Aricept.  8. Depression on Cymbalta.  DVT prophylaxis:lovenox Code Status:Full Code Family Communication: none at bedside, attempted to reach daughter yesterday Disposition Plan: hopefully SNF tomorrow  Subjective: Feels well, no complaints  Objective: Vitals:   02/21/16 0700 02/21/16 0800 02/21/16 0922 02/21/16 1204  BP: (!) 153/69 (!) 157/78  (!) 179/55  Pulse: 96 88  83  Resp: (!) 21 (!) 27  17  Temp: 98 F (36.7  C)   98.3 F (36.8 C)  TempSrc: Oral   Oral  SpO2: 93% 94% 95% 97%  Weight:    56.8 kg (125 lb 4.8 oz)  Height:    5' (1.524 m)    Intake/Output Summary (Last 24 hours) at 02/21/16 1325 Last data filed at 02/21/16 1000  Gross per 24 hour  Intake          2698.33 ml  Output                0 ml  Net          2698.33 ml   Filed Weights   02/19/16 2034 02/20/16 1428 02/21/16 1204  Weight: 56.2 kg (124 lb) 57 kg (125 lb 10.6 oz) 56.8 kg (125 lb 4.8 oz)    Examination:  General exam: Appears calm and comfortable, AAOx2 Respiratory system: Clear to auscultation. Respiratory effort normal. Cardiovascular system: S1 & S2 heard, RRR. No JVD, murmurs, rubs, gallops or clicks. No pedal edema. Gastrointestinal system: Abdomen is nondistended, soft and nontender. No organomegaly or masses felt. Normal bowel sounds heard. Central nervous system: Alert and oriented. No focal neurological deficits. Extremities: Symmetric 5 x 5 power. Skin: No rashes, lesions or ulcers Psychiatry: Judgement and insight appear normal. Mood & affect appropriate.   Data Reviewed: I have personally reviewed following labs and imaging studies  CBC:  Recent Labs Lab 02/14/16 1727 02/15/16 0157 02/19/16 2058 02/20/16 0238 02/21/16 0441  WBC 14.0* 11.5* 10.9* 10.5 9.7  NEUTROABS 11.0*  --  8.1* 7.5  --   HGB 13.5 11.5* 12.0 11.2* 10.4*  HCT 40.3 34.2* 38.5 36.6 33.9*  MCV 85.7 85.3 91.4 91.5 91.6  PLT 434* 380 289 260 [id number]   Basic Metabolic Panel:  Recent Labs Lab 02/14/16 1727 02/15/16 0157 02/16/16 0905 02/19/16 2058 02/20/16 0238 02/21/16 0338  NA 132* 130* 133* 143 142 139  K 4.0 3.6 3.8 4.0 3.8 3.6  CL 95* 94* 98* 112* 110 111  CO2 23 27 27  21* 22 22  GLUCOSE 108* 131* 143* 100* 99 108*  BUN 19 25* 21* 15 12 11   CREATININE 1.05* 1.20* 1.21* 0.92 0.81 0.84  CALCIUM 9.6 8.5* 8.8* 9.0 8.6* 8.7*  MG 1.7  --   --   --   --   --    GFR: Estimated Creatinine Clearance: 40.1 mL/min (by C-G  formula based on SCr of 0.84 mg/dL). Liver Function Tests:  Recent Labs Lab 02/14/16 1727 02/19/16 2058 02/20/16 0238 02/21/16 0338  AST 26 46* 35 24  ALT 19 52 45 33  ALKPHOS 90 75 69 75  BILITOT 0.6 0.4 0.6 0.4  PROT 8.5* 6.6 6.1* 5.7*  ALBUMIN 4.5 3.5 3.0* 2.9*    Recent Labs Lab 02/19/16 2058  LIPASE 47    Recent Labs Lab 02/15/16 0157  AMMONIA 23   Coagulation Profile:  Recent Labs Lab 02/15/16 0157  INR 0.93   Cardiac Enzymes:  Recent Labs Lab 02/19/16 2058 02/20/16 0222  CKTOTAL  --  72  TROPONINI <0.03 0.06*   BNP (last 3 results) No results for input(s): PROBNP in the last 8760 hours. HbA1C: No results for input(s): HGBA1C in the last 72 hours. CBG:  Recent Labs Lab 02/15/16 0809 02/16/16 0809 02/17/16 0745  GLUCAP 102* 108* 99   Lipid Profile: No results for input(s): CHOL, HDL, LDLCALC, TRIG, CHOLHDL, LDLDIRECT in the last 72 hours. Thyroid Function Tests:  Recent Labs  02/20/16 0224  TSH 1.963   Anemia Panel: No results for input(s): VITAMINB12, FOLATE, FERRITIN, TIBC, IRON, RETICCTPCT in the last 72 hours. Urine analysis:    Component Value Date/Time   COLORURINE YELLOW 02/19/2016 2220   APPEARANCEUR CLEAR 02/19/2016 2220   LABSPEC 1.019 02/19/2016 2220   PHURINE 6.0 02/19/2016 2220   GLUCOSEU NEGATIVE 02/19/2016 2220   HGBUR NEGATIVE 02/19/2016 2220   BILIRUBINUR NEGATIVE 02/19/2016 2220   KETONESUR 15 (A) 02/19/2016 2220   PROTEINUR NEGATIVE 02/19/2016 2220   UROBILINOGEN 0.2 07/11/2009 2220   NITRITE NEGATIVE 02/19/2016 2220   LEUKOCYTESUR NEGATIVE 02/19/2016 2220   Sepsis Labs: @LABRCNTIP (procalcitonin:4,lacticidven:4)  ) Recent Results (from the past 240 hour(s))  Urine culture     Status: Abnormal   Collection Time: 02/14/16  4:32 PM  Result Value Ref Range Status   Specimen Description URINE, CLEAN CATCH  Final   Special Requests NONE  Final   Culture MULTIPLE SPECIES PRESENT, SUGGEST RECOLLECTION (A)   Final   Report Status 02/16/2016 FINAL  Final  Culture, blood (x 2)     Status: None   Collection Time: 02/15/16  1:43 AM  Result Value Ref Range Status   Specimen Description BLOOD LEFT ARM  Final   Special Requests IN PEDIATRIC BOTTLE [place]  Final   Culture   Final    NO GROWTH 5 DAYS Performed at [facility]    Report Status 02/20/2016 FINAL  Final  Culture, blood (x 2)     Status: None   Collection Time: 02/15/16  1:48 AM  Result Value Ref Range Status   Specimen Description BLOOD LEFT ARM  Final   Special Requests BOTTLES DRAWN AEROBIC ONLY 5 CC  Final   Culture   Final    NO GROWTH 5 DAYS Performed at [facility]    Report Status 02/20/2016 FINAL  Final  Culture, blood (routine x 2)     Status: None (Preliminary result)   Collection Time: 02/19/16  8:58 PM  Result Value Ref Range Status   Specimen Description BLOOD RIGHT ANTECUBITAL  Final   Special Requests BOTTLES DRAWN AEROBIC AND ANAEROBIC 5CC  Final   Culture NO GROWTH < 24 HOURS  Final   Report Status PENDING  Incomplete  Culture, blood (routine x 2)     Status: None (Preliminary result)   Collection Time: 02/19/16 10:00 PM  Result Value Ref Range Status   Specimen Description BLOOD RIGHT FOREARM  Final   Special Requests BOTTLES DRAWN AEROBIC AND ANAEROBIC 5CC  Final   Culture NO GROWTH < 24 HOURS  Final   Report Status PENDING  Incomplete  Urine culture     Status: None   Collection Time: 02/19/16 10:20 PM  Result Value Ref Range Status   Specimen Description URINE, CATHETERIZED  Final   Special Requests NONE  Final   Culture NO GROWTH  Final   Report Status 02/21/2016 FINAL  Final  MRSA PCR Screening     Status: None   Collection Time: 02/20/16  2:37 PM  Result Value Ref Range Status   MRSA by PCR NEGATIVE NEGATIVE Final    Comment:        The GeneXpert MRSA Assay (FDA approved for NASAL specimens only), is one component of a comprehensive MRSA colonization surveillance program. It is  not intended to diagnose MRSA infection nor to guide or monitor treatment for MRSA infections.          Radiology Studies: Dg Pelvis 1-2 Views  Result Date: 02/20/2016 CLINICAL DATA:  Abdominal pain and bilateral leg pain EXAM: PELVIS - 1-2 VIEW COMPARISON:  CT abdomen and pelvis 02/15/2016 FINDINGS: There is no evidence of pelvic fracture or diastasis. No pelvic bone lesions are seen. Degenerative changes in the lower lumbar spine and in both hips. IMPRESSION: Negative. Electronically Signed   By: Lucienne Capers M.D.   On: 02/20/2016 03:20   Dg Abd 1 View  Result Date: 02/20/2016 CLINICAL DATA:  Abdominal pain. Recent admissions for bradycardia and elevated white cell count. Now with bilateral leg pain, generalized abdominal pain, bowel and urinary incontinence. EXAM: ABDOMEN - 1 VIEW COMPARISON:  None. FINDINGS: Gas and stool throughout the colon. No small or large bowel distention. No radiopaque stones. Degenerative changes in the spine and hips. IMPRESSION: Nonobstructive bowel gas pattern. Gas-filled nondistended colon may indicate ileus. Electronically Signed   By: Lucienne Capers M.D.   On: 02/20/2016 03:19   Ct Head Wo Contrast  Result Date: 02/20/2016 CLINICAL DATA:  Weakness with increased fatigue. Pain throughout the body and in both legs. No injuries. EXAM: CT HEAD WITHOUT CONTRAST CT CERVICAL SPINE WITHOUT CONTRAST TECHNIQUE: Multidetector CT imaging of the head and cervical spine was performed following the standard protocol without intravenous contrast. Multiplanar CT image reconstructions of the cervical spine were also generated. COMPARISON:  None. MRI brain 02/16/2016. CT head 02/14/2016. CT head 12/31/2015. CT cervical spine 11/22/2006 FINDINGS: CT HEAD FINDINGS Brain: Mild cerebral atrophy. Ventricular dilatation consistent with central atrophy. Low-attenuation changes in the deep and periventricular white matter as well as in the midbrain consistent with small vessel  ischemic changes. Basal ganglia  calcifications. No mass effect or midline shift. No abnormal extra-axial fluid collections. Gray-white matter junctions are distinct. Basal cisterns are not effaced. No acute intracranial hemorrhage. Vascular: Atherosclerotic vascular calcifications are present Skull: Normal. Negative for fracture or focal lesion. Sinuses/Orbits: No acute finding. Other: No significant change since previous study. CT CERVICAL SPINE FINDINGS Alignment: Normal Skull base and vertebrae: Diffuse bone demineralization. No vertebral compression deformities. Sclerosis in the left pedicle of C7. This lesion was present without change on the previous study dated 11/22/2006 suggesting benign etiology. Soft tissues and spinal canal: No prevertebral fluid or swelling. No visible canal hematoma. Disc levels: Degenerative changes in the cervical spine with narrowed interspaces and endplate hypertrophic changes most prominent at C5-6 and C6-7 levels. Mild degenerative changes in the facet joints. C1-2 articulation appears intact. Old ununited ossicle over the spinous process at C7. Upper chest: Mild emphysematous changes in the lung apices. Calcification of the cervical carotid arteries. Other: None. IMPRESSION: No acute intracranial abnormalities. Chronic atrophy and small vessel ischemic changes. Normal alignment of the cervical spine. Diffuse degenerative changes. No acute displaced fractures identified. Electronically Signed   By: Lucienne Capers M.D.   On: 02/20/2016 03:33   Ct Cervical Spine Wo Contrast  Result Date: 02/20/2016 CLINICAL DATA:  Weakness with increased fatigue. Pain throughout the body and in both legs. No injuries. EXAM: CT HEAD WITHOUT CONTRAST CT CERVICAL SPINE WITHOUT CONTRAST TECHNIQUE: Multidetector CT imaging of the head and cervical spine was performed following the standard protocol without intravenous contrast. Multiplanar CT image reconstructions of the cervical spine were also  generated. COMPARISON:  None. MRI brain 02/16/2016. CT head 02/14/2016. CT head 12/31/2015. CT cervical spine 11/22/2006 FINDINGS: CT HEAD FINDINGS Brain: Mild cerebral atrophy. Ventricular dilatation consistent with central atrophy. Low-attenuation changes in the deep and periventricular white matter as well as in the midbrain consistent with small vessel ischemic changes. Basal ganglia calcifications. No mass effect or midline shift. No abnormal extra-axial fluid collections. Gray-white matter junctions are distinct. Basal cisterns are not effaced. No acute intracranial hemorrhage. Vascular: Atherosclerotic vascular calcifications are present Skull: Normal. Negative for fracture or focal lesion. Sinuses/Orbits: No acute finding. Other: No significant change since previous study. CT CERVICAL SPINE FINDINGS Alignment: Normal Skull base and vertebrae: Diffuse bone demineralization. No vertebral compression deformities. Sclerosis in the left pedicle of C7. This lesion was present without change on the previous study dated 11/22/2006 suggesting benign etiology. Soft tissues and spinal canal: No prevertebral fluid or swelling. No visible canal hematoma. Disc levels: Degenerative changes in the cervical spine with narrowed interspaces and endplate hypertrophic changes most prominent at C5-6 and C6-7 levels. Mild degenerative changes in the facet joints. C1-2 articulation appears intact. Old ununited ossicle over the spinous process at C7. Upper chest: Mild emphysematous changes in the lung apices. Calcification of the cervical carotid arteries. Other: None. IMPRESSION: No acute intracranial abnormalities. Chronic atrophy and small vessel ischemic changes. Normal alignment of the cervical spine. Diffuse degenerative changes. No acute displaced fractures identified. Electronically Signed   By: Lucienne Capers M.D.   On: 02/20/2016 03:33   Dg Chest Port 1 View  Result Date: 02/19/2016 CLINICAL DATA:  82 y/o F; pain,  weakness, and fever for several days. EXAM: PORTABLE CHEST 1 VIEW COMPARISON:  02/14/2016 chest radiograph. FINDINGS: Stable cardiomediastinal silhouette given differences in technique. Aortic atherosclerosis with arch calcifications. Status post CABG. Sternotomy wires are aligned. No acute osseous abnormality is identified. Low lung volumes with prominent pulmonary markings. Patchy opacities at lung  bases probably represent atelectasis. Pulmonary venous hypertension. IMPRESSION: Low lung volumes. Bibasilar atelectasis. Pulmonary venous hypertension. No acute process identified. Electronically Signed   By: Kristine Garbe M.D.   On: 02/19/2016 21:03        Scheduled Meds: . acidophilus  1 capsule Oral Daily  . amLODipine  10 mg Oral Daily  . aspirin  81 mg Oral Daily  . atorvastatin  10 mg Oral QHS  . diltiazem  120 mg Oral Daily  . donepezil  10 mg Oral Daily  . DULoxetine  60 mg Oral Daily  . enoxaparin (LOVENOX) injection  40 mg Subcutaneous Daily  . famotidine  20 mg Oral Daily  . fluticasone furoate-vilanterol  2 puff Inhalation BID  . folic acid  1 mg Oral Daily  . mirtazapine  15 mg Oral QHS  . pantoprazole  40 mg Oral Daily  . thiamine  100 mg Oral Daily   Continuous Infusions:    LOS: 2 days    Time spent: 35min    Domenic Polite, MD [facility] Pager [phone]  If 7PM-7AM, please contact night-coverage [email or web address] Password [email or web address] 02/21/2016, 1:25 PM

## 2016-02-21 NOTE — Progress Notes (Signed)
MD made aware that pt needs VTE prophylaxsis and that patient believes that MD told her she was going home at discharge today, not SNF placement. Patient reoriented as much as possible but is adamant (pleasantly) that she is going home today. Will continue to monitor patient closely.

## 2016-02-21 NOTE — Progress Notes (Signed)
Report given to [facility] on 5W. Patient cleaned of incontinence of urine and transported via wheelchair by NT to 5W34. All belongings transferred with patient.

## 2016-02-21 NOTE — Plan of Care (Signed)
Problem: Skin Integrity: Goal: Risk for impaired skin integrity will decrease Outcome: Progressing Discussed importance of letting us know when she has to go to the bathroom so she doesn't breakdown her skin with some teach back displayed.  Placed mesh panties and pads to help with possible stress incontience from coughing and gave medication see MAR.

## 2016-02-22 NOTE — Progress Notes (Signed)
CM spoke to patient at the bedside and she was requesting to speak to SW about her options for SNF since she lives alone. CM called Jeani Hawking with SW to advise of need for SW consult as patient is ready for discharge today per MD. CM will follow for ongoing discharge needs.

## 2016-02-22 NOTE — [place] FL2 (Signed)
[place] MEDICAID FL2 LEVEL OF CARE SCREENING TOOL     IDENTIFICATION  Patient Name: Tammy [name] Birthdate: 05/18/1930 Sex: female Admission Date (Current Location): 02/19/2016  [facility] and [place] Number:  Herbalist and Address:  [facility] [place], [place] [address] [place], [place] [address]      Provider Number: [id number]  Attending Physician Name and Address:  Domenic Polite, MD  Relative Name and Phone Number:       Current Level of Care: Hospital Recommended Level of Care: [place] Prior Approval Number: [id number]  Date Approved/Denied: 02/22/16 PASRR Number:    Discharge Plan: SNF    Current Diagnoses: Patient Active Problem List   Diagnosis Date Noted  . Hypothermia 02/20/2016  . SIRS (systemic inflammatory response syndrome) ([place]) 02/19/2016  . Diverticulosis   . Encephalopathy   . Hyponatremia 02/14/2016  . Acute encephalopathy 02/14/2016  . Elevated lactic acid level 02/14/2016  . Dizziness 02/14/2016  . Abdominal pain 02/14/2016  . Symptomatic bradycardia 02/12/2016  . Leukocytosis 02/12/2016  . Depression 02/12/2016  . Renal insufficiency   . SOB (shortness of breath)   . Bradycardia on ECG 02/11/2016  . Atrial fibrillation with RVR ([place]) 01/01/2016  . PAF (paroxysmal atrial fibrillation) ([place]) 01/01/2016  . Alcohol dependence with withdrawal with complication ([place]) [id number]  . Fall 01/01/2016  . AKI (acute kidney injury) ([place])   . Dehydration   . Exertional dyspnea 09/11/2015  . Memory loss 11/13/2013  . Abnormal involuntary movements(781.0) 11/13/2013  . Pulmonary hypertension 11/02/2013  . Generalized weakness 11/02/2013  . Coronary artery disease involving native coronary artery of native heart with angina pectoris ([place]) 11/16/2012  . Mitral regurgitation 11/16/2012  . Renal artery stenosis ([place]) 11/16/2012  . Hyperlipidemia with target LDL less than 70 11/16/2012  . Essential  hypertension 11/16/2012    Orientation RESPIRATION BLADDER Height & Weight     Self, Situation, Place  Normal Incontinent Weight: 125 lb 4.8 oz (56.8 kg) Height:  5' (152.4 cm)  BEHAVIORAL SYMPTOMS/MOOD NEUROLOGICAL BOWEL NUTRITION STATUS      Incontinent Diet (Healthy Heart)  AMBULATORY STATUS COMMUNICATION OF NEEDS Skin   Extensive Assist Verbally Normal                       Personal Care Assistance Level of Assistance  Bathing, Feeding, Dressing Bathing Assistance: Limited assistance Feeding assistance: Independent Dressing Assistance: Limited assistance     Functional Limitations Info  Sight, Hearing, Speech Sight Info: Adequate Hearing Info: Impaired (Has difficulty hearing at times) Speech Info: Adequate    SPECIAL CARE FACTORS FREQUENCY  PT (By licensed PT), OT (By licensed OT)     PT Frequency: 5x OT Frequency: 5x            Contractures Contractures Info: Not present    Additional Factors Info  Code Status, Allergies Code Status Info: Full Code  Allergies Info: Penicillins, Lactose Intolerance ([place])           Current Medications (02/22/2016):  This is the current hospital active medication list Current Facility-Administered Medications  Medication Dose Route Frequency Provider Last Rate Last Dose  . acetaminophen (TYLENOL) tablet 650 mg  650 mg Oral Q6H PRN Rise Patience, MD       Or  . acetaminophen (TYLENOL) suppository 650 mg  650 mg Rectal Q6H PRN Rise Patience, MD      . acidophilus (RISAQUAD) capsule 1 capsule  1 capsule Oral  Daily Rise Patience, MD   1 capsule at 02/22/16 [phone]  . albuterol (PROVENTIL) (2.5 MG/3ML) 0.083% nebulizer solution 2.5 mg  2.5 mg Inhalation Q12H PRN Rise Patience, MD      . amLODipine (NORVASC) tablet 10 mg  10 mg Oral Daily Domenic Polite, MD   10 mg at 02/22/16 0917  . aspirin chewable tablet 81 mg  81 mg Oral Daily Rise Patience, MD   81 mg at 02/22/16 0917  . atorvastatin  (LIPITOR) tablet 10 mg  10 mg Oral QHS Rise Patience, MD   10 mg at 02/21/16 2244  . benzonatate (TESSALON) capsule 100 mg  100 mg Oral Q8H PRN Rise Patience, MD   100 mg at 02/21/16 0149  . clobetasol cream (TEMOVATE) [id number] % 1 application  1 application Topical BID PRN Rise Patience, MD      . diltiazem (CARDIZEM CD) 24 hr capsule 120 mg  120 mg Oral Daily Rise Patience, MD   120 mg at 02/22/16 0917  . donepezil (ARICEPT) tablet 10 mg  10 mg Oral Daily Rise Patience, MD   10 mg at 02/22/16 0917  . DULoxetine (CYMBALTA) DR capsule 60 mg  60 mg Oral Daily Rise Patience, MD   60 mg at 02/22/16 0917  . enoxaparin (LOVENOX) injection 40 mg  40 mg Subcutaneous Daily Domenic Polite, MD   40 mg at 02/22/16 0918  . famotidine (PEPCID) tablet 20 mg  20 mg Oral Daily Rise Patience, MD   20 mg at 02/22/16 0917  . fluticasone furoate-vilanterol (BREO ELLIPTA) 100-25 MCG/INH 2 puff  2 puff Inhalation BID Rise Patience, MD   1 puff at 02/22/16 [phone]  . folic acid (FOLVITE) tablet 1 mg  1 mg Oral Daily Rise Patience, MD   1 mg at 02/22/16 [id number]  . hydrALAZINE (APRESOLINE) injection 10 mg  10 mg Intravenous Q6H PRN Domenic Polite, MD      . mirtazapine (REMERON) tablet 15 mg  15 mg Oral QHS Rise Patience, MD   15 mg at 02/21/16 2244  . ondansetron (ZOFRAN) tablet 4 mg  4 mg Oral Q6H PRN Rise Patience, MD       Or  . ondansetron [facility]) injection 4 mg  4 mg Intravenous Q6H PRN Rise Patience, MD      . pantoprazole (PROTONIX) EC tablet 40 mg  40 mg Oral Daily Rise Patience, MD   40 mg at 02/22/16 0917  . thiamine (VITAMIN B-1) tablet 100 mg  100 mg Oral Daily Rise Patience, MD   100 mg at 02/22/16 [id number]     Discharge Medications: Please see discharge summary for a list of discharge medications.  Relevant Imaging Results:  Relevant Lab Results:   Additional Information SSN [id number]  Georga Kaufmann, LCSWA

## 2016-02-22 NOTE — Progress Notes (Signed)
CSW met with pt and pt's daughter at pt's bedside. Pt's daughter expressed concerns about pt's decision to return home with home health instead of going to SNF. Pt's daughter states that she believes pt will end up back in the hospital if she discharges home. CSW pointed out that pt's daughter raises valid concerns and asked pt for her input. Pt agreed with daughter and states that she would like to be considered for SNF placement as long as she does not return to [facility]. CSW expressed her understanding. CSW will complete FL2 and send out referrals for pt.   Pt's daughter Jackelyn Poling) contact # [place], MSW, [place]

## 2016-02-22 NOTE — Discharge Summary (Signed)
Physician Discharge Summary  Tammy [name] [id number] DOB: 05-06-1931 DOA: 02/19/2016  PCP:  Melinda Crutch, MD  Admit date: 02/19/2016 Discharge date: 02/22/2016  Time spent: 45 minutes  Recommendations for Outpatient Follow-up:  1. PCP Dr.Ross in 1 week   Discharge Diagnoses:  Principal Problem:   Hypothermia Active Problems:   Essential hypertension   Pulmonary hypertension   Generalized weakness   PAF (paroxysmal atrial fibrillation) (HCC)   SIRS (systemic inflammatory response syndrome) (HCC)   Discharge Condition: stable  Diet recommendation: low sodium  Filed Weights   02/19/16 2034 02/20/16 1428 02/21/16 1204  Weight: 56.2 kg (124 lb) 57 kg (125 lb 10.6 oz) 56.8 kg (125 lb 4.8 oz)    History of present illness:  Tammy [name] a 82 y.o.femalewith atrial fibrillation not a candidate for anticoagulation secondary to risk of fall and was recently admitted for acute encephalopathy prior to that was admitted for bradycardia at that time beta blockers were discontinued was brought to the ER after patient was found to be weak and confused and complaining of abdominal discomfort. Patient was discharged 2 days ago following which patient's daughter stated that patient has hardly gotten out of bed because of weakness. Patient denies any chest pain shortness of breath nausea vomiting or diarrhea. In the ER patient was found to be nonfocal but generally weak and hypothermic, was started on broad spectrum Abx   Hospital Course:  1.  Generalized weakness- patient is nonfocal and CT head and neck is unremarkable. -TSH, cortisol level normal -no acute illness identified -Pt eval completed, SNF recommended. -patient declines SNF and hence sent home with maximal home health asistance  2.  Hypothermia -etiology unclear, resolved in ER -stopped Abx next day, no fevers or leukocytosis -blood cx negative -pro calcitonin <0.10 -TSH and cortisol normal  3. Abdominal  discomfort -reported in [place], exam benign, tolerating diet, CT abd pelvis 10/3: unremarkable  4. Atrial fibrillation- chads 2 vasc score is 6 but patient is at risk for falls so not on anticoagulation. Continue Cardizem  5. HTN -continue cardizem -hydralazine PRN  6. CAD - denies any chest pain continue aspirin and statins.  7. Dementia on Aricept.  8. Depressionon Cymbalta.   Discharge Exam: Vitals:   02/22/16 0649 02/22/16 1345  BP: (!) 140/55 (!) 143/51  Pulse: 87 78  Resp: 18 18  Temp: 97.7 F (36.5 C) 97.5 F (36.4 C)    General: AAOx3 Cardiovascular: SS2/RRR Respiratory: CTAB  Discharge Instructions   Discharge Instructions    Diet - low sodium heart healthy    Complete by:  As directed    Increase activity slowly    Complete by:  As directed      Current Discharge Medication List    CONTINUE these medications which have NOT CHANGED   Details  acetaminophen (TYLENOL) 500 MG tablet Take 1,000 mg by mouth 2 (two) times daily.     aspirin 81 MG tablet Take 81 mg by mouth daily.     benzonatate (TESSALON) 100 MG capsule Take 100 mg by mouth every 8 (eight) hours as needed for cough.    clobetasol cream (TEMOVATE) [id number] % Apply 1 application topically 2 (two) times daily as needed (itching and inflammation).    diltiazem (CARDIZEM CD) 120 MG 24 hr capsule Take 1 capsule (120 mg total) by mouth daily.    donepezil (ARICEPT) 10 MG tablet Take 1 tablet (10 mg total) by mouth daily. Qty: 90 tablet, Refills: 1  DULoxetine (CYMBALTA) 60 MG capsule Take 1 capsule by mouth daily. Reported on 08/05/2015 Refills: 2    fluticasone furoate-vilanterol (BREO ELLIPTA) 100-25 MCG/INH AEPB Inhale 2 puffs into the lungs 2 (two) times daily.    folic acid (FOLVITE) 1 MG tablet Take 1 tablet (1 mg total) by mouth daily.    omeprazole (PRILOSEC) 20 MG capsule Take 1 capsule (20 mg total) by mouth 2 (two) times daily before a meal. Qty: 60 capsule,  Refills: 5    Probiotic Product (PROBIOTIC DAILY) CAPS Take 1 capsule by mouth daily.    ranitidine (ZANTAC) 300 MG tablet Take 1 tablet (300 mg total) by mouth at bedtime. Qty: 90 tablet, Refills: 3    thiamine 100 MG tablet Take 1 tablet (100 mg total) by mouth daily.    VENTOLIN HFA 108 (90 BASE) MCG/ACT inhaler Inhale 1 puff into the lungs every 12 (twelve) hours as needed for shortness of breath.     atorvastatin (LIPITOR) 10 MG tablet Take 1 tablet (10 mg total) by mouth at bedtime.      STOP taking these medications     mirtazapine (REMERON) 15 MG tablet      haloperidol (HALDOL) 2 MG tablet        Allergies  Allergen Reactions  . Penicillins Hives    Has patient had a PCN reaction causing immediate rash, facial/tongue/throat swelling, SOB or lightheadedness with hypotension: No Has patient had a PCN reaction causing severe rash involving mucus membranes or skin necrosis: Yes Has patient had a PCN reaction that required hospitalization No Has patient had a PCN reaction occurring within the last 10 years: Yes took again last year 2016 If all of the above answers are "NO", then may proceed with Cephalosporin use.   . Lactose Intolerance (Gi) Other (See Comments)    Unknown- [facility] documented   Follow-up Information     Melinda Crutch, MD. Schedule an appointment as soon as possible for a visit in 1 week(s).   Specialty:  Family Medicine Contact information: [place] [address] [place] Why:  Call if you do not hear from them in 24-48 hours.  Contact information: [address] [place] [address] [phone]            The results of significant diagnostics from this hospitalization (including imaging, microbiology, ancillary and laboratory) are listed below for reference.    Significant Diagnostic Studies: Dg Chest 2  View  Result Date: 02/14/2016 CLINICAL DATA:  82 year old female with cramping of bilateral legs today. History of asthma. History of atrial fibrillation. Former smoker. EXAM: CHEST  2 VIEW COMPARISON:  Chest x-ray 02/11/2016. FINDINGS: No acute consolidative airspace disease. No pleural effusions. No suspicious appearing pulmonary nodules or masses. No pneumothorax. Mild cardiomegaly. No evidence of pulmonary edema. Upper mediastinal contours are within normal limits. Aortic atherosclerosis. Status post median sternotomy for CABG. IMPRESSION: 1. No radiographic evidence of acute cardiopulmonary disease. 2. Mild cardiomegaly. 3. Aortic atherosclerosis. Electronically Signed   By: Vinnie Langton M.D.   On: 02/14/2016 17:13   Dg Chest 2 View  Result Date: 02/11/2016 CLINICAL DATA:  Lightheadedness. EXAM: CHEST  2 VIEW COMPARISON:  01/01/2016 FINDINGS: Previous median sternotomy and CABG procedure.Mild cardiac enlargement. No pleural effusion or edema. Lungs are clear. Spondylosis identified within the thoracic spine. IMPRESSION: 1. No acute findings. 2. Cardiac  enlargement. Electronically Signed   By: Kerby Moors M.D.   On: 02/11/2016 23:17   Dg Pelvis 1-2 Views  Result Date: 02/20/2016 CLINICAL DATA:  Abdominal pain and bilateral leg pain EXAM: PELVIS - 1-2 VIEW COMPARISON:  CT abdomen and pelvis 02/15/2016 FINDINGS: There is no evidence of pelvic fracture or diastasis. No pelvic bone lesions are seen. Degenerative changes in the lower lumbar spine and in both hips. IMPRESSION: Negative. Electronically Signed   By: Lucienne Capers M.D.   On: 02/20/2016 03:20   Dg Abd 1 View  Result Date: 02/20/2016 CLINICAL DATA:  Abdominal pain. Recent admissions for bradycardia and elevated white cell count. Now with bilateral leg pain, generalized abdominal pain, bowel and urinary incontinence. EXAM: ABDOMEN - 1 VIEW COMPARISON:  None. FINDINGS: Gas and stool throughout the colon. No small or large bowel  distention. No radiopaque stones. Degenerative changes in the spine and hips. IMPRESSION: Nonobstructive bowel gas pattern. Gas-filled nondistended colon may indicate ileus. Electronically Signed   By: Lucienne Capers M.D.   On: 02/20/2016 03:19   Ct Head Wo Contrast  Result Date: 02/20/2016 CLINICAL DATA:  Weakness with increased fatigue. Pain throughout the body and in both legs. No injuries. EXAM: CT HEAD WITHOUT CONTRAST CT CERVICAL SPINE WITHOUT CONTRAST TECHNIQUE: Multidetector CT imaging of the head and cervical spine was performed following the standard protocol without intravenous contrast. Multiplanar CT image reconstructions of the cervical spine were also generated. COMPARISON:  None. MRI brain 02/16/2016. CT head 02/14/2016. CT head 12/31/2015. CT cervical spine 11/22/2006 FINDINGS: CT HEAD FINDINGS Brain: Mild cerebral atrophy. Ventricular dilatation consistent with central atrophy. Low-attenuation changes in the deep and periventricular white matter as well as in the midbrain consistent with small vessel ischemic changes. Basal ganglia calcifications. No mass effect or midline shift. No abnormal extra-axial fluid collections. Gray-white matter junctions are distinct. Basal cisterns are not effaced. No acute intracranial hemorrhage. Vascular: Atherosclerotic vascular calcifications are present Skull: Normal. Negative for fracture or focal lesion. Sinuses/Orbits: No acute finding. Other: No significant change since previous study. CT CERVICAL SPINE FINDINGS Alignment: Normal Skull base and vertebrae: Diffuse bone demineralization. No vertebral compression deformities. Sclerosis in the left pedicle of C7. This lesion was present without change on the previous study dated 11/22/2006 suggesting benign etiology. Soft tissues and spinal canal: No prevertebral fluid or swelling. No visible canal hematoma. Disc levels: Degenerative changes in the cervical spine with narrowed interspaces and endplate  hypertrophic changes most prominent at C5-6 and C6-7 levels. Mild degenerative changes in the facet joints. C1-2 articulation appears intact. Old ununited ossicle over the spinous process at C7. Upper chest: Mild emphysematous changes in the lung apices. Calcification of the cervical carotid arteries. Other: None. IMPRESSION: No acute intracranial abnormalities. Chronic atrophy and small vessel ischemic changes. Normal alignment of the cervical spine. Diffuse degenerative changes. No acute displaced fractures identified. Electronically Signed   By: Lucienne Capers M.D.   On: 02/20/2016 03:33   Ct Head Wo Contrast  Result Date: 02/14/2016 CLINICAL DATA:  Altered mental status and headache EXAM: CT HEAD WITHOUT CONTRAST TECHNIQUE: Contiguous axial images were obtained from the base of the skull through the vertex without intravenous contrast. COMPARISON:  Head CT 12/31/2015 FINDINGS: Brain: No mass lesion, intraparenchymal hemorrhage or extra-axial collection. No evidence of acute cortical infarct. There is periventricular hypoattenuation compatible with chronic microvascular disease. There is bilateral basal ganglia mineralization. Old right cerebellar infarct. Vascular: No hyperdense vessel or unexpected calcification. Skull: Normal visualized skull base, calvarium  and extracranial soft tissues. Sinuses/Orbits: No sinus fluid levels or advanced mucosal thickening. No mastoid effusion. Normal orbits. IMPRESSION: 1. No acute intracranial abnormality. 2. Unchanged remote right cerebellar infarct and findings of chronic microvascular disease. Electronically Signed   By: Ulyses Jarred M.D.   On: 02/14/2016 21:06   Ct Cervical Spine Wo Contrast  Result Date: 02/20/2016 CLINICAL DATA:  Weakness with increased fatigue. Pain throughout the body and in both legs. No injuries. EXAM: CT HEAD WITHOUT CONTRAST CT CERVICAL SPINE WITHOUT CONTRAST TECHNIQUE: Multidetector CT imaging of the head and cervical spine was  performed following the standard protocol without intravenous contrast. Multiplanar CT image reconstructions of the cervical spine were also generated. COMPARISON:  None. MRI brain 02/16/2016. CT head 02/14/2016. CT head 12/31/2015. CT cervical spine 11/22/2006 FINDINGS: CT HEAD FINDINGS Brain: Mild cerebral atrophy. Ventricular dilatation consistent with central atrophy. Low-attenuation changes in the deep and periventricular white matter as well as in the midbrain consistent with small vessel ischemic changes. Basal ganglia calcifications. No mass effect or midline shift. No abnormal extra-axial fluid collections. Gray-white matter junctions are distinct. Basal cisterns are not effaced. No acute intracranial hemorrhage. Vascular: Atherosclerotic vascular calcifications are present Skull: Normal. Negative for fracture or focal lesion. Sinuses/Orbits: No acute finding. Other: No significant change since previous study. CT CERVICAL SPINE FINDINGS Alignment: Normal Skull base and vertebrae: Diffuse bone demineralization. No vertebral compression deformities. Sclerosis in the left pedicle of C7. This lesion was present without change on the previous study dated 11/22/2006 suggesting benign etiology. Soft tissues and spinal canal: No prevertebral fluid or swelling. No visible canal hematoma. Disc levels: Degenerative changes in the cervical spine with narrowed interspaces and endplate hypertrophic changes most prominent at C5-6 and C6-7 levels. Mild degenerative changes in the facet joints. C1-2 articulation appears intact. Old ununited ossicle over the spinous process at C7. Upper chest: Mild emphysematous changes in the lung apices. Calcification of the cervical carotid arteries. Other: None. IMPRESSION: No acute intracranial abnormalities. Chronic atrophy and small vessel ischemic changes. Normal alignment of the cervical spine. Diffuse degenerative changes. No acute displaced fractures identified. Electronically  Signed   By: Lucienne Capers M.D.   On: 02/20/2016 03:33   Mr Brain Wo Contrast  Result Date: 02/17/2016 CLINICAL DATA:  Lightheadedness, worsening confusion, now incontinent. History of hypertension, coronary artery disease and atrial fibrillation. EXAM: MRI HEAD WITHOUT CONTRAST TECHNIQUE: Multiplanar, multiecho pulse sequences of the brain and surrounding structures were obtained without intravenous contrast. COMPARISON:  CT HEAD February 14, 2016 and CT HEAD November 22, 2006 FINDINGS: BRAIN: No reduced diffusion to suggest acute ischemia. No susceptibility artifact to suggest hemorrhage. The ventricles and sulci are normal for patient's age ; chronic asymmetry of occipital horns. Mild supra tentorial and pontine chronic small vessel ischemic disease, less than expected for age. No suspicious parenchymal signal, masses or mass effect. No abnormal extra-axial fluid collections. No extra-axial masses though, contrast enhanced sequences would be more sensitive. VASCULAR: Normal major intracranial vascular flow voids present at skull base. SKULL AND UPPER CERVICAL SPINE: No abnormal sellar expansion. No suspicious calvarial bone marrow signal. Craniocervical junction maintained. SINUSES/ORBITS: The mastoid air-cells and included paranasal sinuses are well-aerated. Status post bilateral ocular lens implants. The included ocular globes and orbital contents are non-suspicious. OTHER: None. IMPRESSION: Negative MRI head for age. Electronically Signed   By: Elon Alas M.D.   On: 02/17/2016 00:17   Ct Abdomen Pelvis W Contrast  Result Date: 02/15/2016 CLINICAL DATA:  Confused and combative this  morning with lower abdominal tenderness, left greater than right. EXAM: CT ABDOMEN AND PELVIS WITH CONTRAST TECHNIQUE: Multidetector CT imaging of the abdomen and pelvis was performed using the standard protocol following bolus administration of intravenous contrast. CONTRAST:  ISOVUE-300 IOPAMIDOL (ISOVUE-300)  INJECTION 61% COMPARISON:  CT abdomen pelvis - 01/24/2013; lumbar spine MRI - 01/18/2013 FINDINGS: Lower chest: Limited visualization of lower thorax demonstrates minimal dependent subpleural ground-glass atelectasis. No focal airspace opacities. No pleural effusion. Normal heart size.  No pericardial effusion. Hepatobiliary: Normal hepatic contour. There is mild diffuse decreased attenuation of the hepatic parenchyma on this postcontrast examination suggestive of hepatic steatosis. No discrete hepatic lesions. There are several laminated gallstones within the neck of an otherwise normal-appearing gallstone bladder with dominant stone measuring approximately 1.2 cm in diameter (coronal image 62, series 5). No gallbladder wall thickening or pericholecystic fluid. Normal caliber the common bile duct for age. No intra extrahepatic bili duct dilatation. No ascites. Pancreas: Normal appearance of the pancreas. No peripancreatic stranding. Spleen: Normal appearance of the spleen. Adrenals/Urinary Tract: There is symmetric enhancement and excretion of the bilateral kidneys. No definite renal stones on this postcontrast examination. Sub cm left-sided renal lesion is too small to adequately characterize of favored to represent a renal cyst. No discrete right-sided renal lesions. No urinary obstruction or perinephric stranding. Normal appearance of the urinary bladder given degree distention. Normal appearance of the bilateral adrenal glands. Stomach/Bowel: Rather extensive colonic diverticulosis without evidence of superimposed acute diverticulitis. Normal appearance of the terminal ileum and retrocecal appendix. Enteric contrast extends to the level of the cecum. No evidence of enteric obstruction. No pneumoperitoneum, pneumatosis or portal venous gas. Vascular/Lymphatic: Moderate amount of eccentric mixed calcified and noncalcified atherosclerotic plaque within a normal caliber abdominal aorta. The major branch vessels of  the abdominal aorta appear patent on this non CTA examination. No bulky retroperitoneal, mesenteric, pelvic or inguinal lymphadenopathy. Reproductive: Normal appearance of the pelvic organs for age. No free fluid in the pelvic cul-de-sac. Other: Ill-defined stranding and scattered foci of subcutaneous emphysema about the anterior aspect of the lower abdominal pannus likely at the location of prior subcutaneous medication administration. Musculoskeletal: No acute or aggressive osseous abnormalities. 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 Moderate severe multilevel lumbar spine DDD, worse at L2-L3 and L3-L4 with disc space height loss, endplate irregularity and sclerosis. Increased sclerosis involving the left femoral head may represent the sequela of early avascular necrosis. No evidence of articular surface collapse. IMPRESSION: 1. No acute findings within the abdomen or pelvis. 2. Extensive colonic diverticulosis without evidence of diverticulitis. 3. Cholelithiasis without evidence of cholecystitis. 4.  Aortic Atherosclerosis (ICD10-170.0) Electronically Signed   By: Sandi Mariscal M.D.   On: 02/15/2016 08:58   Dg Chest Port 1 View  Result Date: 02/19/2016 CLINICAL DATA:  82 y/o F; pain, weakness, and fever for several days. EXAM: PORTABLE CHEST 1 VIEW COMPARISON:  02/14/2016 chest radiograph. FINDINGS: Stable cardiomediastinal silhouette given differences in technique. Aortic atherosclerosis with arch calcifications. Status post CABG. Sternotomy wires are aligned. No acute osseous abnormality is identified. Low lung volumes with prominent pulmonary markings. Patchy opacities at lung bases probably represent atelectasis. Pulmonary venous hypertension. IMPRESSION: Low lung volumes. Bibasilar atelectasis. Pulmonary venous hypertension. No acute process identified. Electronically Signed   By: Kristine Garbe M.D.   On: 02/19/2016 21:03    Microbiology: Recent Results (from the past 240 hour(s))   Urine culture     Status: Abnormal   Collection Time: 02/14/16  4:32 PM  Result Value Ref Range Status   Specimen Description URINE, CLEAN CATCH  Final   Special Requests NONE  Final   Culture MULTIPLE SPECIES PRESENT, SUGGEST RECOLLECTION (A)  Final   Report Status 02/16/2016 FINAL  Final  Culture, blood (x 2)     Status: None   Collection Time: 02/15/16  1:43 AM  Result Value Ref Range Status   Specimen Description BLOOD LEFT ARM  Final   Special Requests IN PEDIATRIC BOTTLE [place]  Final   Culture   Final    NO GROWTH 5 DAYS Performed at [facility]    Report Status 02/20/2016 FINAL  Final  Culture, blood (x 2)     Status: None   Collection Time: 02/15/16  1:48 AM  Result Value Ref Range Status   Specimen Description BLOOD LEFT ARM  Final   Special Requests BOTTLES DRAWN AEROBIC ONLY 5 CC  Final   Culture   Final    NO GROWTH 5 DAYS Performed at [facility]    Report Status 02/20/2016 FINAL  Final  Culture, blood (routine x 2)     Status: None (Preliminary result)   Collection Time: 02/19/16  8:58 PM  Result Value Ref Range Status   Specimen Description BLOOD RIGHT ANTECUBITAL  Final   Special Requests BOTTLES DRAWN AEROBIC AND ANAEROBIC 5CC  Final   Culture NO GROWTH 2 DAYS  Final   Report Status PENDING  Incomplete  Culture, blood (routine x 2)     Status: None (Preliminary result)   Collection Time: 02/19/16 10:00 PM  Result Value Ref Range Status   Specimen Description BLOOD RIGHT FOREARM  Final   Special Requests BOTTLES DRAWN AEROBIC AND ANAEROBIC 5CC  Final   Culture NO GROWTH 2 DAYS  Final   Report Status PENDING  Incomplete  Urine culture     Status: None   Collection Time: 02/19/16 10:20 PM  Result Value Ref Range Status   Specimen Description URINE, CATHETERIZED  Final   Special Requests NONE  Final   Culture NO GROWTH  Final   Report Status 02/21/2016 FINAL  Final  MRSA PCR Screening     Status: None   Collection Time: 02/20/16  2:37 PM   Result Value Ref Range Status   MRSA by PCR NEGATIVE NEGATIVE Final    Comment:        The GeneXpert MRSA Assay (FDA approved for NASAL specimens only), is one component of a comprehensive MRSA colonization surveillance program. It is not intended to diagnose MRSA infection nor to guide or monitor treatment for MRSA infections.      Labs: Basic Metabolic Panel:  Recent Labs Lab 02/16/16 0905 02/19/16 2058 02/20/16 0238 02/21/16 0338  NA 133* 143 142 139  K 3.8 4.0 3.8 3.6  CL 98* 112* 110 111  CO2 27 21* 22 22  GLUCOSE 143* 100* 99 108*  BUN 21* 15 12 11   CREATININE 1.21* 0.92 0.81 0.84  CALCIUM 8.8* 9.0 8.6* 8.7*   Liver Function Tests:  Recent Labs Lab 02/19/16 2058 02/20/16 0238 02/21/16 0338  AST 46* 35 24  ALT 52 45 33  ALKPHOS 75 69 75  BILITOT 0.4 0.6 0.4  PROT 6.6 6.1* 5.7*  ALBUMIN 3.5 3.0* 2.9*    Recent Labs Lab 02/19/16 2058  LIPASE 47   No results for input(s): AMMONIA in the last 168 hours. CBC:  Recent Labs Lab 02/19/16 2058 02/20/16 0238 02/21/16 0441  WBC 10.9* 10.5 9.7  NEUTROABS 8.1* 7.5  --   HGB 12.0 11.2* 10.4*  HCT 38.5 36.6 33.9*  MCV 91.4 91.5 91.6  PLT 289 260 275   Cardiac Enzymes:  Recent Labs Lab 02/19/16 2058 02/20/16 0222  CKTOTAL  --  63  TROPONINI <0.03 0.06*   BNP: BNP (last 3 results)  Recent Labs  02/12/16 0801 02/14/16 1727  BNP 651.0* 166.4*    ProBNP (last 3 results) No results for input(s): PROBNP in the last 8760 hours.  CBG:  Recent Labs Lab 02/16/16 0809 02/17/16 0745  GLUCAP 108* 99       Signed:  Lattie Riege MD.  [facility] 02/22/2016, 2:36 PM

## 2016-02-22 NOTE — Clinical Social Work Note (Signed)
Clinical Social Work Assessment  Patient Details  Name: Tammy [name] MRN: [id number] Date of Birth: [birth year]  Date of referral:  02/22/16               Reason for consult:  Facility Placement                Permission sought to share information with:  Family Supports, Chartered certified accountant granted to share information::  Yes, Verbal Permission Granted  Name::        Agency::     Relationship::     Contact Information:     Housing/Transportation Living arrangements for the past 2 months:  Single Family Home Source of Information:  Patient Patient Interpreter Needed:  None Criminal Activity/Legal Involvement Pertinent to Current Situation/Hospitalization:  No - Comment as needed Significant Relationships:  Adult Children Lives with:  Self Do you feel safe going back to the place where you live?  Yes Need for family participation in patient care:  Yes (Comment)  Care giving concerns: Pt is currently unable to care for herself independently and lives alone.   Social Worker assessment / plan:  CSW received consult for possible SNF placement. CSW engaged with pt at her bedside. CSW introduced herself and her role as a Education officer, museum. CSW explained to pt that PT is recommending SNF placement for her and that CSW would be able to assist in referral to a facility. Pt reports that she has been in rehab before and does not wish to return to a facility. Pt states that she has been out of her home for the past several months and she really would just like to spend some time at her home. CSW expressed her understanding and provided emotional support. Pt clearly expressed that she would like to receive home health services instead of SNF placement. CSW informed Consulting civil engineer. No further social work needs at this time. CSW signing off. However, CSW is available should another need arise.  Employment status:  Retired Nurse, adult PT  Recommendations:  [place] / Referral to community resources:  Other (Comment Required), [place] ([place] )  Patient/Family's Response to care: Pt would like to d/c home with home health.  Patient/Family's Understanding of and Emotional Response to Diagnosis, Current Treatment, and Prognosis: Pt is appreciative of the care provided by CSW at this time.  Emotional Assessment Appearance:  Appears stated age Attitude/Demeanor/Rapport:  Other (Cooperative) Affect (typically observed):  Calm, Pleasant Orientation:  Oriented to Self, Oriented to Place, Oriented to  Time, Oriented to Situation Alcohol / Substance use:  Not Applicable Psych involvement (Current and /or in the community):   Unknown  Discharge Needs  Concerns to be addressed:  Care Coordination, Discharge Planning Concerns Readmission within the last 30 days:  Yes Current discharge risk:  Dependent with Mobility Barriers to Discharge:  No Barriers Identified   Georga Kaufmann, LCSWA 02/22/2016, 12:14 PM

## 2016-02-22 NOTE — Care Management Note (Addendum)
Case Management Note  Patient Details  Name: Tammy [name] MRN: [id number] Date of Birth: 1930/12/29  Subjective/Objective:                  Chief Complaint: Lightheadedness, lower abdominal pain   Action/Plan: Cm spoke to patient who is now refusing SNF and wants to go home. She has her daughter who will be able to provide intermittent support. Pt aware of recommendation to SNF but states that she still wants to go home. Patient setup with [facility] for SW, RN, aide, PT, OT and ORders  Faxed with confirmation received. She said that she has all needed DME at home including walker and declined 3n1.   Expected Discharge Date:  02/22/16              Expected Discharge Plan:  [place]  In-House Referral:     Discharge planning Services  CM Consult  Post Acute Care Choice:  Home Health Choice offered to:     DME Arranged:    DME Agency:     HH Arranged:  RN, PT, OT, Nurse's Aide, Social Work [employer] Agency:  [facility]  Status of Service:  Completed, signed off  If discussed at [employer] of Stay Meetings, dates discussed:    Additional Comments:  Guido Sander, RN 02/22/2016, 12:28 PM

## 2016-02-23 NOTE — Progress Notes (Signed)
Patient transported out via stretcher with [employer]

## 2016-02-23 NOTE — Progress Notes (Deleted)
OT Cancellation Note  Patient Details Name: Tammy [name] MRN: [id number] DOB: Oct 29, 1930   Cancelled Treatment:    Reason Eval/Treat Not Completed: Other (comment). Per CM note and RN note pt to D/C to SNF today and attempt has been made to call report to [facility]. Defer eval to that facility unless D/C is delayed.  Almon Register [id number] 02/23/2016, 12:32 PM

## 2016-02-23 NOTE — Progress Notes (Signed)
Patient will DC to: [facility] Anticipated DC date: 02/23/16 Family notified: Daughter by phone Transport by: Corey Harold   Per MD patient ready for DC to [facility]. RN, patient, patient's family, and facility notified of DC. Discharge Summary sent to facility. RN given number for report. DC packet on chart. Ambulance transport requested for patient.   CSW signing off.  Cedric Fishman, [place] Social Worker [phone]

## 2016-02-23 NOTE — Progress Notes (Signed)
Report given to [facility] at [facility]

## 2016-02-23 NOTE — Progress Notes (Signed)
Pt seen, no changes from Discharge summary 10/8

## 2016-02-23 NOTE — Care Management Note (Signed)
Case Management Note  Patient Details  Name: Tammy [name] MRN: [id number] Date of Birth: [birth year]  Subjective/Objective:        Admitted with hypothermia.            Action/Plan: Plan is to d/c to SNF today.  Expected Discharge Date:   02/23/2016       Expected Discharge Plan: SNF    Status of Service:  Completed, signed off  If discussed at Long Length of Stay Meetings, dates discussed:    Additional Comments:  Sharin Mons, RN 02/23/2016, 11:01 AM

## 2016-02-23 NOTE — Progress Notes (Signed)
Attempted report to [facility]

## 2016-02-23 NOTE — Care Management Important Message (Signed)
Important Message  Patient Details  Name: Tammy [name] MRN: [id number] Date of Birth: Jun 09, 1930   Medicare Important Message Given:  Yes    [name] [place] 02/23/2016, 10:36 AM

## 2016-02-23 NOTE — Evaluation (Signed)
Occupational Therapy Evaluation and Discharge Patient Details Name: Tammy [name] MRN: [id number] DOB: [birth year] Today's Date: 02/23/2016    History of Present Illness Tammy [name] is a 82 y.o. female with atrial fibrillation not a candidate for anticoagulation secondary to risk of fall and was recently admitted for acute encephalopathy prior to that was admitted for bradycardia at that time beta blockers were discontinued was brought to the ER after patient was found to be weak and confused and complaining of abdominal discomfort.   PMH positive for GERD, Asthma, COPD, CAD, HTN, HLD, A-fib and IBS.   Clinical Impression   This 82 yo female admitted with above presents to acute OT with deficits below (see OT problem list) thus affecting her safety and independence with basic ADLs. Pt will continue to benefit from continued OT at SNF to work towards Mod I level. Acute OT will sign off with deferral of remainder OT at SNF.    Follow Up Recommendations  SNF    Equipment Recommendations  Other (comment) (TBD at SNF)       Precautions / Restrictions Precautions Precautions: Fall Restrictions Weight Bearing Restrictions: No      Mobility Bed Mobility               General bed mobility comments: Pt up in recliner upon arrival  Transfers Overall transfer level: Needs assistance Equipment used: Rolling walker (2 wheeled) Transfers: Sit to/from Stand Sit to Stand: Supervision         General transfer comment: S ambulating to bathroom with RW, then when pt came out to sink she put it off to the side and then I let her walk back to recliner without it (pt min guard with RW)    Balance Overall balance assessment: Needs assistance Sitting-balance support: No upper extremity supported;Feet supported Sitting balance-Leahy Scale: Good     Standing balance support: No upper extremity supported Standing balance-Leahy Scale: Fair                               ADL Overall ADL's : Needs assistance/impaired                                       General ADL Comments: Overall at a S level due to safety issues and decreased balance when using RW, min guard A without RW               Pertinent Vitals/Pain Pain Assessment: No/denies pain     Hand Dominance     Extremity/Trunk Assessment Upper Extremity Assessment Upper Extremity Assessment: Overall WFL for tasks assessed              Cognition Arousal/Alertness: Awake/alert Behavior During Therapy: WFL for tasks assessed/performed Overall Cognitive Status: Impaired/Different from baseline                 General Comments: Gave pt a washcloth to wash her bottom post going to bathroom and she started washing her face              Home Living Family/patient expects to be discharged to:: Skilled nursing facility  OT Problem List: Impaired balance (sitting and/or standing);Decreased cognition;Decreased safety awareness   OT Treatment/Interventions:      OT Goals(Current goals can be found in the care plan section) Acute Rehab OT Goals Patient Stated Goal: to go to rehab (but not [facility]) and then home  OT Frequency:                End of Session Equipment Utilized During Treatment: Gait belt  Activity Tolerance: Patient tolerated treatment well Patient left: in chair;with call bell/phone within reach;with chair alarm set   Time: 1240-1259 OT Time Calculation (min): 19 min Charges:  OT General Charges $OT Visit: 1 Procedure OT Evaluation $OT Eval Moderate Complexity: 1 Procedure  Almon Register [id number] 02/23/2016, 1:10 PM

## 2016-02-23 NOTE — Clinical Social Work Placement (Signed)
   CLINICAL SOCIAL WORK PLACEMENT  NOTE  Date:  02/23/2016  Patient Details  Name: Tammy [name] MRN: [id number] Date of Birth: [birth year]  Clinical Social Work is seeking post-discharge placement for this patient at the [place] level of care (*CSW will initial, date and re-position this form in  chart as items are completed):  Yes   Patient/family provided with [place] Work Department's list of facilities offering this level of care within the geographic area requested by the patient (or if unable, by the patient's family).  Yes   Patient/family informed of their freedom to choose among providers that offer the needed level of care, that participate in Medicare, Medicaid or managed care program needed by the patient, have an available bed and are willing to accept the patient.  Yes   Patient/family informed of [place]'s ownership interest in [facility] and [facility], as well as of the fact that they are under no obligation to receive care at these facilities.  PASRR submitted to EDS on       PASRR number received on       Existing PASRR number confirmed on 02/23/16     FL2 transmitted to all facilities in geographic area requested by pt/family on 02/22/16     FL2 transmitted to all facilities within larger geographic area on       Patient informed that his/her managed care company has contracts with or will negotiate with certain facilities, including the following:        Yes   Patient/family informed of bed offers received.  Patient [facility] bed at [facility]     Physician recommends and patient chooses bed at      Patient to be transferred to [facility] on 02/23/16.  Patient to be transferred to facility by [facility]     Patient family notified on 02/23/16 of transfer.  Name of family member notified:  Daughter     PHYSICIAN Please sign FL2     Additional Comment:     _______________________________________________ Benard Halsted, [place] 02/23/2016, 2:49 PM

## 2016-02-24 LAB — CULTURE, BLOOD (ROUTINE X 2)
Culture: NO GROWTH
Culture: NO GROWTH

## 2016-03-11 NOTE — Progress Notes (Signed)
SLP Note (late entry):    02/20/16 1200  SLP G-Codes **NOT FOR INPATIENT CLASS**  Functional Assessment Tool Used skilled clinical judgment  Functional Limitations Swallowing  Swallow Current Status [id number]) CK  Swallow Goal Status [id number]) CJ  SLP Evaluations  $ SLP Speech Visit 1 Procedure  SLP Evaluations  $BSS Swallow 1 Procedure    Germain Osgood, M.A. CCC-SLP [phone]

## 2016-03-12 NOTE — Progress Notes (Signed)
   02/23/16 1312  OT G-codes **NOT FOR INPATIENT CLASS**  Functional Assessment Tool Used clinical observation and chart review  Functional Limitation Self care  Self Care Current Status [id number]) CI  Self Care Goal Status [id number]) CI  Self Care Discharge Status [phone]) [place], OTR/L [id number] 03/12/2016

## 2016-03-12 NOTE — Progress Notes (Signed)
PT G-code note    02/20/16 1508  PT G-Codes **NOT FOR INPATIENT CLASS**  Functional Assessment Tool Used clinical judgement  Functional Limitation Mobility: Walking and moving around  Mobility: Walking and Moving Around Current Status [phone]) CL  Mobility: Walking and Moving Around Goal Status [phone]) [place], [place] [phone] 03/12/2016

## 2016-03-13 ENCOUNTER — Emergency Department (HOSPITAL_COMMUNITY): Payer: Medicare HMO

## 2016-03-13 ENCOUNTER — Encounter (HOSPITAL_COMMUNITY): Payer: Self-pay | Admitting: Emergency Medicine

## 2016-03-13 ENCOUNTER — Emergency Department (HOSPITAL_COMMUNITY)
Admission: EM | Admit: 2016-03-13 | Discharge: 2016-03-14 | Disposition: A | Discharge status: Home / Self Care | Payer: Medicare HMO | Attending: Emergency Medicine | Admitting: Emergency Medicine

## 2016-03-13 DIAGNOSIS — I2510 Atherosclerotic heart disease of native coronary artery without angina pectoris: Secondary | ICD-10-CM | POA: Diagnosis not present

## 2016-03-13 DIAGNOSIS — I10 Essential (primary) hypertension: Secondary | ICD-10-CM | POA: Diagnosis not present

## 2016-03-13 DIAGNOSIS — S51012A Laceration without foreign body of left elbow, initial encounter: Secondary | ICD-10-CM

## 2016-03-13 DIAGNOSIS — W1839XA Other fall on same level, initial encounter: Secondary | ICD-10-CM | POA: Diagnosis not present

## 2016-03-13 DIAGNOSIS — Z87891 Personal history of nicotine dependence: Secondary | ICD-10-CM | POA: Insufficient documentation

## 2016-03-13 DIAGNOSIS — Z79899 Other long term (current) drug therapy: Secondary | ICD-10-CM | POA: Insufficient documentation

## 2016-03-13 DIAGNOSIS — Z7982 Long term (current) use of aspirin: Secondary | ICD-10-CM | POA: Diagnosis not present

## 2016-03-13 DIAGNOSIS — Z951 Presence of aortocoronary bypass graft: Secondary | ICD-10-CM | POA: Diagnosis not present

## 2016-03-13 DIAGNOSIS — J449 Chronic obstructive pulmonary disease, unspecified: Secondary | ICD-10-CM | POA: Insufficient documentation

## 2016-03-13 DIAGNOSIS — Y92481 Parking lot as the place of occurrence of the external cause: Secondary | ICD-10-CM | POA: Insufficient documentation

## 2016-03-13 DIAGNOSIS — S52531A Colles' fracture of right radius, initial encounter for closed fracture: Secondary | ICD-10-CM | POA: Diagnosis not present

## 2016-03-13 DIAGNOSIS — Y999 Unspecified external cause status: Secondary | ICD-10-CM | POA: Insufficient documentation

## 2016-03-13 DIAGNOSIS — S52501A Unspecified fracture of the lower end of right radius, initial encounter for closed fracture: Secondary | ICD-10-CM | POA: Diagnosis present

## 2016-03-13 DIAGNOSIS — Y939 Activity, unspecified: Secondary | ICD-10-CM | POA: Insufficient documentation

## 2016-03-13 MED ORDER — LIDOCAINE-EPINEPHRINE-TETRACAINE (LET) SOLUTION
3.0000 mL | Freq: Once | NASAL | Status: AC
  Administered 2016-03-13: 3 mL via TOPICAL
  Filled 2016-03-13: qty 3

## 2016-03-13 MED ORDER — TRAMADOL HCL 50 MG PO TABS: 50.0000 mg | ORAL_TABLET | Freq: Four times a day (QID) | ORAL | 0 refills | Status: DC | PRN

## 2016-03-13 MED ORDER — HYDROCODONE-ACETAMINOPHEN 5-325 MG PO TABS
1.0000 | ORAL_TABLET | Freq: Once | ORAL | Status: AC
  Administered 2016-03-13: 1 via ORAL
  Filled 2016-03-13: qty 1

## 2016-03-13 NOTE — ED Triage Notes (Addendum)
Pt's family member reports that they were going to get a pedicure and the pt fell in the parking lot on the way in.  She reports wearing sandals that got caught on a crack which made her fall.  Denies any LOC or head injury.  Denies hx of falls.  Reports that she went to see PCP and got X-rays.  Dx w/broken right wrist and skin tear on left upper arm.  PCP told her to come to ED for tx.  Bleeding is controlled by occlusive dressing on upper left arm.  Pt has full ROM and sensation in fingers on right hand and palpable pulses bilaterally.  Bruising and deformity of right wrist joint.

## 2016-03-13 NOTE — ED Provider Notes (Signed)
[facility] DEPT Provider Note   CSN: [id number] Arrival date & time: 03/13/16  1732  By signing my name below, I, Sonum Patel, attest that this documentation has been prepared under the direction and in the presence of Tenya Araque A Ledarius Leeson PA-C,. Electronically Signed: Sonum Patel, Education administrator. 03/13/16. 8:59 PM.  History   Chief Complaint Chief Complaint  Patient presents with  . Fall  . Wrist Injury    The history is provided by the patient and a relative. No language interpreter was used.     HPI Comments: Tammy [name] is a 82 y.o. female who presents to the Emergency Department complaining of a fall that occurred earlier today. Patient was with family member who states they were in a parking lot when patient's sandal became caught on a crack, causing her to fall. Family member is unsure of the exact mechanism of the fall but patient has constant, unchanged right wrist pain. She was seen by her PCP who diagnosed her with a right wrist fracture and skin tear on the left upper arm. PCP advised patient to come to the ED for treatment. Family member states patient has been doing well for the past 3 weeks until this incident. She denies numbness, abdominal pain, neck pain. She has a history of A-fib but only take ASA, no other anti-coagulants.   Past Medical History:  Diagnosis Date  . Acid reflux   . Asthma    Asthmatic COPD  . Atrial fibrillation ([place]) 01/01/2016  . Coronary artery disease    Echo 07/27/2011   . Coronary artery stenosis    , High-grade left main  . GERD (gastroesophageal reflux disease)   . Hyperlipidemia   . Hypertension   . IBS (irritable bowel syndrome)   . Psoriasis     Patient Active Problem List   Diagnosis Date Noted  . Hypothermia 02/20/2016  . SIRS (systemic inflammatory response syndrome) ([place]) 02/19/2016  . Diverticulosis   . Encephalopathy   . Hyponatremia 02/14/2016  . Acute encephalopathy 02/14/2016  . Elevated lactic acid level 02/14/2016  .  Dizziness 02/14/2016  . Abdominal pain 02/14/2016  . Symptomatic bradycardia 02/12/2016  . Leukocytosis 02/12/2016  . Depression 02/12/2016  . Renal insufficiency   . SOB (shortness of breath)   . Bradycardia on ECG 02/11/2016  . Atrial fibrillation with RVR ([place]) 01/01/2016  . PAF (paroxysmal atrial fibrillation) ([place]) 01/01/2016  . Alcohol dependence with withdrawal with complication ([place]) [id number]  . Fall 01/01/2016  . AKI (acute kidney injury) ([place])   . Dehydration   . Exertional dyspnea 09/11/2015  . Memory loss 11/13/2013  . Abnormal involuntary movements(781.0) 11/13/2013  . Pulmonary hypertension 11/02/2013  . Generalized weakness 11/02/2013  . Coronary artery disease involving native coronary artery of native heart with angina pectoris ([place]) 11/16/2012  . Mitral regurgitation 11/16/2012  . Renal artery stenosis ([place]) 11/16/2012  . Hyperlipidemia with target LDL less than 70 11/16/2012  . Essential hypertension 11/16/2012    Past Surgical History:  Procedure Laterality Date  . CARDIAC SURGERY    . CORONARY ARTERY BYPASS GRAFT  1994   After catheterization showed 90% ostial left main stenosis  . DOPPLER ECHOCARDIOGRAPHY     Study showed mild to moderate MR with moderate TR, mild to moderate pulmonary hypertension with an ejection fraction greater that 55%.   [name] EYE SURGERY    . Nuclear Perfusion  10/2010, 11/15/2012   Showed normal perfusion, No Ischemia or Infarction, Normal EF  . Renal Scan Duplex  04/09/2012   Showing greater than 50% diameter reduction in the celiac artery and SMA.. Right proximal 275 and Mid 152.  [name] ROTATOR CUFF REPAIR     bilateral  . Sleep Study  12/28/2006   AHI during total sleep time (3h 19 minutes) was 1.81/hr and during REM sleep at 17.78/hr. Mild sleep apnea during REM sleep.Oxygen staturated rate during REM and NREM was 93.0%.    OB History    No data available       Home Medications    Prior to Admission medications     Medication Sig Start Date End Date Taking? Authorizing Provider  acetaminophen (TYLENOL) 500 MG tablet Take 1,000 mg by mouth 2 (two) times daily.     Historical Provider, MD  aspirin 81 MG tablet Take 81 mg by mouth daily.     Historical Provider, MD  atorvastatin (LIPITOR) 10 MG tablet Take 1 tablet (10 mg total) by mouth at bedtime. 01/06/16   Ripudeep Krystal Eaton, MD  benzonatate (TESSALON) 100 MG capsule Take 100 mg by mouth every 8 (eight) hours as needed for cough.    Historical Provider, MD  clobetasol cream (TEMOVATE) [id number] % Apply 1 application topically 2 (two) times daily as needed (itching and inflammation).    Historical Provider, MD  diltiazem (CARDIZEM CD) 120 MG 24 hr capsule Take 1 capsule (120 mg total) by mouth daily. 01/06/16   Ripudeep Krystal Eaton, MD  donepezil (ARICEPT) 10 MG tablet Take 1 tablet (10 mg total) by mouth daily. 07/13/14   Marcial Pacas, MD  DULoxetine (CYMBALTA) 60 MG capsule Take 1 capsule by mouth daily. Reported on 08/05/2015 09/13/14   Historical Provider, MD  fluticasone furoate-vilanterol (BREO ELLIPTA) 100-25 MCG/INH AEPB Inhale 2 puffs into the lungs 2 (two) times daily.    Historical Provider, MD  folic acid (FOLVITE) 1 MG tablet Take 1 tablet (1 mg total) by mouth daily. 01/06/16   Ripudeep Krystal Eaton, MD  omeprazole (PRILOSEC) 20 MG capsule Take 1 capsule (20 mg total) by mouth 2 (two) times daily before a meal. 08/05/15   Jiles Prows, MD  Probiotic Product (PROBIOTIC DAILY) CAPS Take 1 capsule by mouth daily.    Historical Provider, MD  ranitidine (ZANTAC) 300 MG tablet Take 1 tablet (300 mg total) by mouth at bedtime. 07/21/15   [name], MD  thiamine 100 MG tablet Take 1 tablet (100 mg total) by mouth daily. 01/06/16   Ripudeep Krystal Eaton, MD  VENTOLIN HFA 108 (90 BASE) MCG/ACT inhaler Inhale 1 puff into the lungs every 12 (twelve) hours as needed for shortness of breath.  11/27/12   Historical Provider, MD    Family History Family History  Problem Relation Age of Onset   . Heart disease Mother   . Hypertension Sister     Social History Social History  Substance Use Topics  . Smoking status: Former Smoker    Quit date: 11/08/1962  . Smokeless tobacco: Never Used  . Alcohol use 0.0 oz/week     Comment: drinks wine/beer daily     Allergies   Penicillins and Lactose intolerance (gi)   Review of Systems Review of Systems  Constitutional: Negative for diaphoresis and fever.  Eyes: Negative for visual disturbance.  Respiratory: Negative for shortness of breath.   Cardiovascular: Negative for chest pain.  Gastrointestinal: Negative for abdominal pain and nausea.  Musculoskeletal: Positive for arthralgias and myalgias. Negative for back pain and neck pain.  Skin: Positive for wound.  Neurological:  Negative for syncope, numbness and headaches.  All other systems reviewed and are negative.    Physical Exam Updated Vital Signs BP 184/75 (BP Location: Right Arm)   Pulse 70   Temp 98.5 F (36.9 C) (Oral)   Resp 19   Ht 5' (1.524 m)   Wt 125 lb (56.7 kg)   SpO2 96%   BMI 24.41 kg/m   Physical Exam  Constitutional: She is oriented to person, place, and time. She appears well-developed and well-nourished.  HENT:  Head: Normocephalic and atraumatic.  Neck:  No midline C spine tenderness  Cardiovascular: Normal rate, regular rhythm, normal heart sounds and intact distal pulses.   No murmur heard. Pulmonary/Chest: Effort normal. She exhibits tenderness.  Left lower chest wall tenderness without bruising or tenting   Abdominal: There is no tenderness.  Musculoskeletal: Normal range of motion. She exhibits tenderness and deformity. She exhibits no edema.  Right wrist has dorsomedial deformity, closed injury. NVI distally. Preserved ROM to all digits.  Bilateral elbows and shoulders nontender.  12 cm skin tear to lateral left upper arm without bony deformity.  Left clavicular tenderness, without deformity.  Full ROM of lower extremities  No  pelvic tenderness   Neurological: She is alert and oriented to person, place, and time.  Skin: Skin is warm and dry.  Psychiatric: She has a normal mood and affect.  Nursing note and vitals reviewed.    ED Treatments / Results  DIAGNOSTIC STUDIES: Oxygen Saturation is 96% on RA, adequate by my interpretation.    COORDINATION OF CARE: 8:59 PM Discussed treatment plan with pt at bedside and pt agreed to plan.    Labs (all labs ordered are listed, but only abnormal results are displayed) Labs Reviewed - No data to display  EKG  EKG Interpretation None      LACERATION REPAIR Performed by: Charlann Lange A Authorized by: Charlann Lange A Consent: Verbal consent obtained. Risks and benefits: risks, benefits and alternatives were discussed Consent given by: patient Patient identity confirmed: provided demographic data Prepped and Draped in normal sterile fashion Wound explored  Laceration Location: left upper arm  Laceration Length: 12 cm  No Foreign Bodies seen or palpated  Anesthesia: local infiltration  Local anesthetic: L.E.T.  Anesthetic total: 3 ml  Irrigation method: syringe Amount of cleaning: standard The skin tear was loosened and approximated.   Skin closure: steri-strips  Number of sutures: 4 steri-strips  Technique: steri-strips  Patient tolerance: Patient tolerated the procedure well with no immediate complications.  Radiology No results found.  Procedures Procedures (including critical care time)  Medications Ordered in ED Medications - No data to display   Initial Impression / Assessment and Plan / ED Course  I have reviewed the triage vital signs and the nursing notes.  Pertinent labs & imaging results that were available during my care of the patient were reviewed by me and considered in my medical decision making (see chart for details).  Clinical Course    Patient with mechanical fall causing wrist fracture on right and skin  tear on left upper arm, repaired as above. She does not have evidence of head injury. She has been awake and fully oriented for duration of ER visit.   The wrist injury ws discussed with Dr. Burney Gauze who advises to splint and send to the office for follow up and further treatment of fractured wrist. A sugar tong splint was applied.   Final Clinical Impressions(s) / ED Diagnoses   Final diagnoses:  None   1. Fall 2. Right radial fracture 3. Skin tear  New Prescriptions New Prescriptions   No medications on file   I personally performed the services described in this documentation, which was scribed in my presence. The recorded information has been reviewed and is accurate.      Charlann Lange, PA-C 03/15/16 [id number]    Tanna Furry, MD 03/23/16 [phone]

## 2016-03-13 NOTE — ED Provider Notes (Signed)
Patient seen and evaluated. Discussed with Judeen Hammans up still PA-C. Patient has dorsal angulation and radial deviation of her wrist. Has some previous existing deformity due to arthritis. X-rays reviewed. Intra-articular dorsally angulated nondisplaced fracture. Discussed the case with Dr. Burney Gauze. He has reviewed the patient's films. He does not feel the reduction in the emergency room would be necessary or likely to hold position. He recommends splint and follow up with his office to discuss possible operative repair.   Tanna Furry, MD 03/13/16 [phone]

## 2016-03-14 MED ORDER — TRAMADOL HCL 50 MG PO TABS
50.0000 mg | ORAL_TABLET | Freq: Once | ORAL | Status: AC
  Administered 2016-03-14: 50 mg via ORAL
  Filled 2016-03-14: qty 1

## 2016-03-16 DIAGNOSIS — S52501A Unspecified fracture of the lower end of right radius, initial encounter for closed fracture: Secondary | ICD-10-CM | POA: Insufficient documentation

## 2016-03-23 ENCOUNTER — Ambulatory Visit (INDEPENDENT_AMBULATORY_CARE_PROVIDER_SITE_OTHER): Payer: Medicare HMO | Admitting: Allergy and Immunology

## 2016-03-23 ENCOUNTER — Encounter: Payer: Self-pay | Admitting: Allergy and Immunology

## 2016-03-23 VITALS — BP 142/72 | HR 88 | Temp 94.0°F | Resp 24

## 2016-03-23 DIAGNOSIS — J4541 Moderate persistent asthma with (acute) exacerbation: Secondary | ICD-10-CM | POA: Diagnosis not present

## 2016-03-23 DIAGNOSIS — K219 Gastro-esophageal reflux disease without esophagitis: Secondary | ICD-10-CM

## 2016-03-23 DIAGNOSIS — I272 Pulmonary hypertension, unspecified: Secondary | ICD-10-CM

## 2016-03-23 DIAGNOSIS — J3089 Other allergic rhinitis: Secondary | ICD-10-CM | POA: Diagnosis not present

## 2016-03-23 MED ORDER — METHYLPREDNISOLONE ACETATE 80 MG/ML IJ SUSP
80.0000 mg | Freq: Once | INTRAMUSCULAR | Status: AC
  Administered 2016-03-23: 80 mg via INTRAMUSCULAR

## 2016-03-23 NOTE — Patient Instructions (Addendum)
  1. Use the following every day:   A. continue Breo 100 one inhalation 1 time per day  B. Omeprazole 20mg  one tablet two times per day  C. Ranitidine 300 one time per day - PM  D. flonase one spray each nostril one time per day  2. If Needed;   A. Ventolin HFA  B. Cetirizine  3. Depomedrol 80 Im delivered in clinic today  4. Submit for Xolair approval again  5. Return in 4 weeks

## 2016-03-23 NOTE — Progress Notes (Signed)
Follow-up Note  Referring Provider: [name], MD Primary Provider: Melinda Crutch, MD Date of Office Visit: 03/23/2016  Subjective:   Tammy [name] (DOB: March 13, 1931) is a 82 y.o. female who returns to the [facility] on 03/23/2016 in re-evaluation of the following:  HPI: Tammy [name] returns to this clinic in reevaluation of her respiratory tract issues which include a combination of various heart and lung issues including pulmonary hypertension and asthma. I have not seen her in his clinic since March 2017.  She just completed a three-month period of rehabilitation for a balance problem and falling. She developed a cough sometime during this timeframe and it is not responding to her Breo. It does not sound as though she has been treated with systemic steroids at any point in the past 6 months or so for her asthma. She does not use a short acting bronchodilator very often.  Her nose is doing relatively well while using her nasal steroid. She has not required an antibiotic for an episode of sinusitis.  Her reflux is under very good control while on a combination of medications for this condition.  When I last saw her in this clinic she was having problems with diarrhea but fortunately that appear to completely resolved without any specific therapy.    Medication List      aspirin 81 MG tablet Take 81 mg by mouth daily.   atorvastatin 10 MG tablet Commonly known as:  LIPITOR Take 1 tablet (10 mg total) by mouth at bedtime.   benzonatate 100 MG capsule Commonly known as:  TESSALON Take 100 mg by mouth every 8 (eight) hours as needed for cough.   BREO ELLIPTA 100-25 MCG/INH Aepb Generic drug:  fluticasone furoate-vilanterol Inhale 2 puffs into the lungs 2 (two) times daily.   clobetasol cream 0.05 % Commonly known as:  TEMOVATE Apply 1 application topically 2 (two) times daily as needed (itching and inflammation).   diltiazem 120 MG 24 hr capsule Commonly  known as:  CARDIZEM CD Take 1 capsule (120 mg total) by mouth daily.   donepezil 10 MG tablet Commonly known as:  ARICEPT Take 1 tablet (10 mg total) by mouth daily.   DULoxetine 60 MG capsule Commonly known as:  CYMBALTA Take 1 capsule by mouth daily. Reported on [id number]   folic acid 1 MG tablet Commonly known as:  FOLVITE Take 1 tablet (1 mg total) by mouth daily.   omeprazole 20 MG capsule Commonly known as:  PRILOSEC Take 1 capsule (20 mg total) by mouth 2 (two) times daily before a meal.   PROBIOTIC DAILY Caps Take 1 capsule by mouth daily.   ranitidine 300 MG tablet Commonly known as:  ZANTAC Take 1 tablet (300 mg total) by mouth at bedtime.   thiamine 100 MG tablet Take 1 tablet (100 mg total) by mouth daily.   traMADol 50 MG tablet Commonly known as:  ULTRAM Take 1 tablet (50 mg total) by mouth every 6 (six) hours as needed.   VENTOLIN HFA 108 (90 Base) MCG/ACT inhaler Generic drug:  albuterol Inhale 1 puff into the lungs every 12 (twelve) hours as needed for shortness of breath.       Past Medical History:  Diagnosis Date  . Acid reflux   . Asthma    Asthmatic COPD  . Atrial fibrillation ([place]) 01/01/2016  . Coronary artery disease    Echo 07/27/2011   . Coronary artery stenosis    , High-grade left  main  . GERD (gastroesophageal reflux disease)   . Hyperlipidemia   . Hypertension   . IBS (irritable bowel syndrome)   . Psoriasis     Past Surgical History:  Procedure Laterality Date  . CARDIAC SURGERY    . CORONARY ARTERY BYPASS GRAFT  1994   After catheterization showed 90% ostial left main stenosis  . DOPPLER ECHOCARDIOGRAPHY     Study showed mild to moderate MR with moderate TR, mild to moderate pulmonary hypertension with an ejection fraction greater that 55%.   [name] EYE SURGERY    . Nuclear Perfusion  10/2010, 11/15/2012   Showed normal perfusion, No Ischemia or Infarction, Normal EF  . Renal Scan Duplex  04/09/2012   Showing greater than  50% diameter reduction in the celiac artery and SMA.. Right proximal 275 and Mid 152.  [name] ROTATOR CUFF REPAIR     bilateral  . Sleep Study  12/28/2006   AHI during total sleep time (3h 19 minutes) was 1.81/hr and during REM sleep at 17.78/hr. Mild sleep apnea during REM sleep.Oxygen staturated rate during REM and NREM was 93.0%.    Allergies  Allergen Reactions  . Penicillins Hives    Has patient had a PCN reaction causing immediate rash, facial/tongue/throat swelling, SOB or lightheadedness with hypotension: No Has patient had a PCN reaction causing severe rash involving mucus membranes or skin necrosis: Yes Has patient had a PCN reaction that required hospitalization No Has patient had a PCN reaction occurring within the last 10 years: Yes took again last year 2016 If all of the above answers are "NO", then may proceed with Cephalosporin use.   . Lactose Intolerance (Gi) Other (See Comments)    Unknown- [facility] documented    Review of systems negative except as noted in HPI / PMHx or noted below:  Review of Systems  Constitutional: Negative.   HENT: Negative.   Eyes: Negative.   Respiratory: Negative.   Cardiovascular: Negative.   Gastrointestinal: Negative.   Genitourinary: Negative.   Musculoskeletal: Negative.   Skin: Negative.   Neurological: Negative.   Endo/Heme/Allergies: Negative.   Psychiatric/Behavioral: Negative.      Objective:   Vitals:   03/23/16 1431  BP: (!) 142/72  Pulse: 88  Resp: (!) 24  Temp: (!) 94 F (34.4 C)          Physical Exam  Constitutional: She is well-developed, well-nourished, and in no distress.  Raspy voice  HENT:  Head: Normocephalic.  Right Ear: Tympanic membrane, external ear and ear canal normal.  Left Ear: Tympanic membrane, external ear and ear canal normal.  Nose: Nose normal. No mucosal edema or rhinorrhea.  Mouth/Throat: Uvula is midline, oropharynx is clear and moist and mucous membranes  are normal. No oropharyngeal exudate.  Eyes: Conjunctivae are normal.  Neck: Trachea normal. No tracheal tenderness present. No tracheal deviation present. No thyromegaly present.  Cardiovascular: Normal rate, regular rhythm, S1 normal, S2 normal and normal heart sounds.   No murmur heard. Pulmonary/Chest: Breath sounds normal. No stridor. No respiratory distress. She has no wheezes. She has no rales.  Musculoskeletal: She exhibits no edema.  Lymphadenopathy:       Head (right side): No tonsillar adenopathy present.       Head (left side): No tonsillar adenopathy present.    She has no cervical adenopathy.  Neurological: She is alert. Gait normal.  Intention tremor  Skin: No rash noted. She is not diaphoretic. No erythema. Nails show no clubbing.  Psychiatric: Mood and affect normal.    Diagnostics:    Spirometry was performed and demonstrated an FEV1 of 0.81 at 50 % of predicted.  The patient had an Asthma Control Test with the following results:  .    Assessment and Plan:   1. Asthma, not well controlled, moderate persistent, with acute exacerbation   2. Other allergic rhinitis   3. LPRD (laryngopharyngeal reflux disease)   4. Pulmonary hypertension     1. Use the following every day:   A. Continue Breo 200 one inhalation 1 time per day  B. Omeprazole 20mg  one tablet two times per day  C. Ranitidine 300 one time per day - PM  D. flonase one spray each nostril one time per day  2. If Needed;   A. Ventolin HFA  B. Cetirizine  3. Depomedrol 80 Im delivered in clinic today  4. Submit for Xolair approval again  5. Return in 4 weeks  I will have Weld utilize a systemic steroid in the hope of getting better distribution of her inhaled steroid within her lungs. We cannot use a MDI formulation of inhaled steroid because she cannot operate this device and we need to have her undergo treatment utilizing a self actuated powder device such as Breo. I have tried to get her on  Xolair in the past and I would like once again to see if we can consider utilizing that medication as I suspect it will give her a fair amount of improvement regarding her respiratory tract issue. I will regroup with her in 4 weeks to assess her response to the therapy mentioned above and consider further evaluation treatment pending her response.  Allena Katz, MD [facility]

## 2016-05-12 ENCOUNTER — Ambulatory Visit: Payer: Medicare HMO | Admitting: Allergy and Immunology

## 2016-05-20 DIAGNOSIS — S52551A Other extraarticular fracture of lower end of right radius, initial encounter for closed fracture: Secondary | ICD-10-CM | POA: Diagnosis not present

## 2016-05-26 ENCOUNTER — Other Ambulatory Visit: Payer: Self-pay

## 2016-05-26 MED ORDER — FLUTICASONE FUROATE-VILANTEROL 100-25 MCG/INH IN AEPB: 2.0000 | INHALATION_SPRAY | Freq: Two times a day (BID) | RESPIRATORY_TRACT | 2 refills | Status: DC

## 2016-06-01 ENCOUNTER — Encounter (INDEPENDENT_AMBULATORY_CARE_PROVIDER_SITE_OTHER): Payer: Self-pay

## 2016-06-01 ENCOUNTER — Encounter: Payer: Self-pay | Admitting: Allergy and Immunology

## 2016-06-01 ENCOUNTER — Ambulatory Visit (INDEPENDENT_AMBULATORY_CARE_PROVIDER_SITE_OTHER): Payer: PPO | Admitting: Allergy and Immunology

## 2016-06-01 VITALS — BP 150/78 | HR 88 | Resp 24

## 2016-06-01 DIAGNOSIS — I272 Pulmonary hypertension, unspecified: Secondary | ICD-10-CM

## 2016-06-01 DIAGNOSIS — J4551 Severe persistent asthma with (acute) exacerbation: Secondary | ICD-10-CM | POA: Diagnosis not present

## 2016-06-01 DIAGNOSIS — K219 Gastro-esophageal reflux disease without esophagitis: Secondary | ICD-10-CM

## 2016-06-01 DIAGNOSIS — J3089 Other allergic rhinitis: Secondary | ICD-10-CM

## 2016-06-01 DIAGNOSIS — J4540 Moderate persistent asthma, uncomplicated: Secondary | ICD-10-CM | POA: Diagnosis not present

## 2016-06-01 MED ORDER — ARFORMOTEROL TARTRATE 15 MCG/2ML IN NEBU: 15.0000 ug | INHALATION_SOLUTION | Freq: Two times a day (BID) | RESPIRATORY_TRACT | 5 refills | Status: DC

## 2016-06-01 MED ORDER — BUDESONIDE 0.5 MG/2ML IN SUSP: 0.5000 mg | Freq: Two times a day (BID) | RESPIRATORY_TRACT | 5 refills | Status: DC

## 2016-06-01 MED ORDER — IPRATROPIUM-ALBUTEROL 0.5-2.5 (3) MG/3ML IN SOLN: 3.0000 mL | RESPIRATORY_TRACT | 5 refills | Status: DC | PRN

## 2016-06-01 MED ORDER — AZITHROMYCIN 500 MG PO TABS: 500.0000 mg | ORAL_TABLET | Freq: Every day | ORAL | 0 refills | Status: DC

## 2016-06-01 NOTE — Progress Notes (Signed)
Follow-up Note  Referring Provider: [name], MD Primary Provider: Melinda Crutch, MD Date of Office Visit: 06/01/2016  Subjective:   Tammy [name] (DOB: 17-Sep-1930) is a 83 y.o. female who returns to the [facility] on 06/01/2016 in re-evaluation of the following:  HPI: Tammy [name] returns to this clinic in reevaluation of her respiratory tract issue. She has a collection of cardiovascular and pulmonary issues including asthma and pulmonary hypertension. She was last seen in this clinic November 2017 at which time she appeared to be having some problems with her respiratory tract in the form of inflammation for which I gave her a systemic steroid and provided her various medications to address her chronic inflammatory pulmonary disease and we attempted to get approval for Xolair administration.  She was doing well but unfortunately at Christmas she developed a significant episode of coughing and wheezing and sputum production and using her bronchodilator 4 times per day. This occurred even though she continues to use her medications as best as possible. She does not have any fever or ugly sputum production and she does not have a tremendous amount of upper airway symptoms. She has not been having a flare of reflux.  Allergies as of 06/01/2016      Reactions   Penicillins Hives   Has patient had a PCN reaction causing immediate rash, facial/tongue/throat swelling, SOB or lightheadedness with hypotension: No Has patient had a PCN reaction causing severe rash involving mucus membranes or skin necrosis: Yes Has patient had a PCN reaction that required hospitalization No Has patient had a PCN reaction occurring within the last 10 years: Yes took again last year 2016 If all of the above answers are "NO", then may proceed with Cephalosporin use.   Lactose Intolerance (gi) Other (See Comments)   Unknown- [facility] documented      Medication List        aspirin 81 MG tablet Take 81 mg by mouth daily.   atorvastatin 10 MG tablet Commonly known as:  LIPITOR Take 1 tablet (10 mg total) by mouth at bedtime.   diltiazem 120 MG 24 hr capsule Commonly known as:  CARDIZEM CD Take 1 capsule (120 mg total) by mouth daily.   donepezil 10 MG tablet Commonly known as:  ARICEPT Take 1 tablet (10 mg total) by mouth daily.   DULoxetine 60 MG capsule Commonly known as:  CYMBALTA Take 1 capsule by mouth daily. Reported on [id number]   folic acid 1 MG tablet Commonly known as:  FOLVITE Take 1 tablet (1 mg total) by mouth daily.   omeprazole 20 MG capsule Commonly known as:  PRILOSEC Take 1 capsule (20 mg total) by mouth 2 (two) times daily before a meal.   PROBIOTIC DAILY Caps Take 1 capsule by mouth daily.   ranitidine 300 MG tablet Commonly known as:  ZANTAC Take 1 tablet (300 mg total) by mouth at bedtime.   thiamine 100 MG tablet Take 1 tablet (100 mg total) by mouth daily.   traMADol 50 MG tablet Commonly known as:  ULTRAM Take 1 tablet (50 mg total) by mouth every 6 (six) hours as needed.   VENTOLIN HFA 108 (90 Base) MCG/ACT inhaler Generic drug:  albuterol Inhale 1 puff into the lungs every 12 (twelve) hours as needed for shortness of breath.       Past Medical History:  Diagnosis Date  . Acid reflux   . Asthma    Asthmatic COPD  .  Atrial fibrillation ([place]) 01/01/2016  . Coronary artery disease    Echo 07/27/2011   . Coronary artery stenosis    , High-grade left main  . GERD (gastroesophageal reflux disease)   . Hyperlipidemia   . Hypertension   . IBS (irritable bowel syndrome)   . Psoriasis     Past Surgical History:  Procedure Laterality Date  . CARDIAC SURGERY    . CORONARY ARTERY BYPASS GRAFT  1994   After catheterization showed 90% ostial left main stenosis  . DOPPLER ECHOCARDIOGRAPHY     Study showed mild to moderate MR with moderate TR, mild to moderate pulmonary hypertension with an ejection  fraction greater that 55%.   [name] EYE SURGERY    . Nuclear Perfusion  10/2010, 11/15/2012   Showed normal perfusion, No Ischemia or Infarction, Normal EF  . Renal Scan Duplex  04/09/2012   Showing greater than 50% diameter reduction in the celiac artery and SMA.. Right proximal 275 and Mid 152.  [name] ROTATOR CUFF REPAIR     bilateral  . Sleep Study  12/28/2006   AHI during total sleep time (3h 19 minutes) was 1.81/hr and during REM sleep at 17.78/hr. Mild sleep apnea during REM sleep.Oxygen staturated rate during REM and NREM was 93.0%.    Review of systems negative except as noted in HPI / PMHx or noted below:  Review of Systems  Constitutional: Negative.   HENT: Negative.   Eyes: Negative.   Respiratory: Negative.   Cardiovascular: Negative.   Gastrointestinal: Negative.   Genitourinary: Negative.   Musculoskeletal: Negative.   Skin: Negative.   Neurological: Negative.   Endo/Heme/Allergies: Negative.   Psychiatric/Behavioral: Negative.      Objective:   Vitals:   06/01/16 1707  BP: (!) 150/78  Pulse: 88  Resp: (!) 24          Physical Exam  Constitutional: She is well-developed, well-nourished, and in no distress.  HENT:  Head: Normocephalic.  Right Ear: Tympanic membrane, external ear and ear canal normal.  Left Ear: Tympanic membrane, external ear and ear canal normal.  Nose: Nose normal. No mucosal edema or rhinorrhea.  Mouth/Throat: Uvula is midline, oropharynx is clear and moist and mucous membranes are normal. No oropharyngeal exudate.  Eyes: Conjunctivae are normal.  Neck: Trachea normal. No tracheal tenderness present. No tracheal deviation present. No thyromegaly present.  Cardiovascular: Normal rate, regular rhythm, S1 normal, S2 normal and normal heart sounds.   No murmur heard. Pulmonary/Chest: No stridor. No respiratory distress. She has wheezes (bilateral expiratory wheezes posterior lung fields). She has no rales.  Musculoskeletal: She exhibits no  edema.  Lymphadenopathy:       Head (right side): No tonsillar adenopathy present.       Head (left side): No tonsillar adenopathy present.    She has no cervical adenopathy.  Neurological: She is alert. Gait normal.  Skin: No rash noted. She is not diaphoretic. No erythema. Nails show no clubbing.  Psychiatric: Mood and affect normal.    Diagnostics:    Spirometry was performed and demonstrated an FEV1 of 0.90 at 55 % of predicted.  Assessment and Plan:   1. Asthma, not well controlled, severe persistent, with acute exacerbation   2. Other allergic rhinitis   3. LPRD (laryngopharyngeal reflux disease)   4. Pulmonary hypertension     1. Use the following every day:   A. budesonide 0.5% nebulized twice a day  B. Brovona nebulized twice a day  C. Omeprazole 20mg  one tablet  two times per day  D. Ranitidine 300 one time per day - PM  E. flonase one spray each nostril one time per day  2. If Needed;   A. Ventolin HFA 2 puffs or DuoNeb nebulization every 4-6 hours  B. Cetirizine  3. Prednisone 10 mg tablet - 4 tablets now and then 2 tablets twice a day for 2 days then 2 tablets once a day for 4 days  4. DuoNeb nebulization delivered in clinic  5. Azithromycin 500 mg tablet once a day for 3 days  6. Return in one week  [place] once again is demonstrating significant inflammation of her respiratory tract and I will start her on a different form of anti-inflammatory medication for her lungs as I doubt  that she can perform the inhalation maneuver that's required for any of the inhalers we have available at this point in time. In addition she'll use a little bit more systemic steroid and I've given her broad-spectrum antibiotic to cover the possibility of mycoplasma. We were unsuccessful in getting approval for Xolair administration as it would be too expensive for her to utilize this medication at this point. I will see her back in this clinic next week or earlier if there is a  problem.  Allena Katz, MD [place]

## 2016-06-01 NOTE — Patient Instructions (Addendum)
  1. Use the following every day:   A. budesonide 0.5% nebulized twice a day  B. Brovona nebulized twice a day  C. Omeprazole 20mg  one tablet two times per day  D. Ranitidine 300 one time per day - PM  E. flonase one spray each nostril one time per day  2. If Needed;   A. Ventolin HFA 2 puffs or DuoNeb nebulization every 4-6 hours  B. Cetirizine  3. Prednisone 10 mg tablet - 4 tablets now and then 2 tablets twice a day for 2 days then 2 tablets once a day for 4 days  4. DuoNeb nebulization delivered in clinic  5. Azithromycin 500 mg tablet once a day for 3 days  6. Return in one week

## 2016-06-09 ENCOUNTER — Encounter: Payer: Self-pay | Admitting: Allergy and Immunology

## 2016-06-09 ENCOUNTER — Ambulatory Visit (INDEPENDENT_AMBULATORY_CARE_PROVIDER_SITE_OTHER): Payer: PPO | Admitting: Allergy and Immunology

## 2016-06-09 VITALS — BP 144/68 | HR 88 | Resp 22

## 2016-06-09 DIAGNOSIS — J4551 Severe persistent asthma with (acute) exacerbation: Secondary | ICD-10-CM

## 2016-06-09 DIAGNOSIS — K219 Gastro-esophageal reflux disease without esophagitis: Secondary | ICD-10-CM

## 2016-06-09 DIAGNOSIS — J3089 Other allergic rhinitis: Secondary | ICD-10-CM

## 2016-06-09 NOTE — Progress Notes (Signed)
Follow-up Note  Referring Provider: [name], MD Primary Provider: Melinda Crutch, MD Date of Office Visit: 06/09/2016  Subjective:   Tammy [name] (DOB: 04-25-1931) is a 83 y.o. female who returns to the [facility] on 06/09/2016 in re-evaluation of the following:  HPI: Tammy [name] returns to this clinic in evaluation of her very significant inflammatory flare that was addressed on 06/01/2016 with the use of systemic steroids and a plan of anti-inflammatory medications for her respiratory tract as well as aggressive therapy directed against reflux. She is much better at this point in time. She has very little coughing and wheezing. She's not been having any problems with reflux and she has no problems with her nose.  Allergies as of 06/09/2016      Reactions   Penicillins Hives   Has patient had a PCN reaction causing immediate rash, facial/tongue/throat swelling, SOB or lightheadedness with hypotension: No Has patient had a PCN reaction causing severe rash involving mucus membranes or skin necrosis: Yes Has patient had a PCN reaction that required hospitalization No Has patient had a PCN reaction occurring within the last 10 years: Yes took again last year 2016 If all of the above answers are "NO", then may proceed with Cephalosporin use.   Lactose Intolerance (gi) Other (See Comments)   Unknown- [facility] documented      Medication List      arformoterol 15 MCG/2ML Nebu Commonly known as:  BROVANA Take 2 mLs (15 mcg total) by nebulization 2 (two) times daily.   aspirin 81 MG tablet Take 81 mg by mouth daily.   atorvastatin 10 MG tablet Commonly known as:  LIPITOR Take 1 tablet (10 mg total) by mouth at bedtime.   azithromycin 500 MG tablet Commonly known as:  ZITHROMAX Take 1 tablet (500 mg total) by mouth daily.   BREO ELLIPTA 100-25 MCG/INH Aepb Generic drug:  fluticasone furoate-vilanterol Inhale 1 puff into the lungs  daily.   budesonide 0.5 MG/2ML nebulizer solution Commonly known as:  PULMICORT Take 2 mLs (0.5 mg total) by nebulization 2 (two) times daily.   diltiazem 120 MG 24 hr capsule Commonly known as:  CARDIZEM CD Take 1 capsule (120 mg total) by mouth daily.   donepezil 10 MG tablet Commonly known as:  ARICEPT Take 1 tablet (10 mg total) by mouth daily.   DULoxetine 60 MG capsule Commonly known as:  CYMBALTA Take 1 capsule by mouth daily. Reported on [id number]   folic acid 1 MG tablet Commonly known as:  FOLVITE Take 1 tablet (1 mg total) by mouth daily.   ipratropium-albuterol 0.5-2.5 (3) MG/3ML Soln Commonly known as:  DUONEB Take 3 mLs by nebulization every 4 (four) hours as needed.   meclizine 25 MG tablet Commonly known as:  ANTIVERT Take 25 mg by mouth daily.   omeprazole 20 MG capsule Commonly known as:  PRILOSEC Take 1 capsule (20 mg total) by mouth 2 (two) times daily before a meal.   PROBIOTIC DAILY Caps Take 1 capsule by mouth daily.   ranitidine 300 MG tablet Commonly known as:  ZANTAC Take 1 tablet (300 mg total) by mouth at bedtime.   thiamine 100 MG tablet Take 1 tablet (100 mg total) by mouth daily.   traMADol 50 MG tablet Commonly known as:  ULTRAM Take 1 tablet (50 mg total) by mouth every 6 (six) hours as needed.   VENTOLIN HFA 108 (90 Base) MCG/ACT inhaler Generic drug:  albuterol Inhale 1 puff into the lungs every 12 (twelve) hours as needed for shortness of breath.       Past Medical History:  Diagnosis Date  . Acid reflux   . Asthma    Asthmatic COPD  . Atrial fibrillation ([place]) 01/01/2016  . Coronary artery disease    Echo 07/27/2011   . Coronary artery stenosis    , High-grade left main  . GERD (gastroesophageal reflux disease)   . Hyperlipidemia   . Hypertension   . IBS (irritable bowel syndrome)   . Psoriasis     Past Surgical History:  Procedure Laterality Date  . CARDIAC SURGERY    . CORONARY ARTERY BYPASS GRAFT  1994    After catheterization showed 90% ostial left main stenosis  . DOPPLER ECHOCARDIOGRAPHY     Study showed mild to moderate MR with moderate TR, mild to moderate pulmonary hypertension with an ejection fraction greater that 55%.   [name] EYE SURGERY    . Nuclear Perfusion  10/2010, 11/15/2012   Showed normal perfusion, No Ischemia or Infarction, Normal EF  . Renal Scan Duplex  04/09/2012   Showing greater than 50% diameter reduction in the celiac artery and SMA.. Right proximal 275 and Mid 152.  [name] ROTATOR CUFF REPAIR     bilateral  . Sleep Study  12/28/2006   AHI during total sleep time (3h 19 minutes) was 1.81/hr and during REM sleep at 17.78/hr. Mild sleep apnea during REM sleep.Oxygen staturated rate during REM and NREM was 93.0%.    Review of systems negative except as noted in HPI / PMHx or noted below:  Review of Systems  Constitutional: Negative.   HENT: Negative.   Eyes: Negative.   Respiratory: Negative.   Cardiovascular: Negative.   Gastrointestinal: Negative.   Genitourinary: Negative.   Musculoskeletal: Negative.   Skin: Negative.   Neurological: Negative.   Endo/Heme/Allergies: Negative.   Psychiatric/Behavioral: Negative.      Objective:   Vitals:   06/09/16 0935  BP: (!) 144/68  Pulse: 88  Resp: (!) 22          Physical Exam  Constitutional: She is well-developed, well-nourished, and in no distress.  HENT:  Head: Normocephalic.  Right Ear: Tympanic membrane, external ear and ear canal normal.  Left Ear: Tympanic membrane, external ear and ear canal normal.  Nose: Nose normal. No mucosal edema or rhinorrhea.  Mouth/Throat: Uvula is midline, oropharynx is clear and moist and mucous membranes are normal. No oropharyngeal exudate.  Eyes: Conjunctivae are normal.  Neck: Trachea normal. No tracheal tenderness present. No tracheal deviation present. No thyromegaly present.  Cardiovascular: Normal rate, regular rhythm, S1 normal, S2 normal and normal heart sounds.    No murmur heard. Pulmonary/Chest: Breath sounds normal. No stridor. No respiratory distress. She has no wheezes. She has no rales.  Musculoskeletal: She exhibits no edema.  Lymphadenopathy:       Head (right side): No tonsillar adenopathy present.       Head (left side): No tonsillar adenopathy present.    She has no cervical adenopathy.  Neurological: She is alert. Gait normal.  Skin: No rash noted. She is not diaphoretic. No erythema. Nails show no clubbing.  Psychiatric: Mood and affect normal.    Diagnostics:  Oxygen saturation on room air was 96%.   Spirometry was performed and demonstrated an FEV1 of 0.81 at 50 % of predicted.  Assessment and Plan:   1. Asthma, not well controlled, severe persistent, with acute exacerbation  2. Other allergic rhinitis   3. LPRD (laryngopharyngeal reflux disease)     1. Use the following every day   A. budesonide 0.5% nebulized twice a day  B. Brovona nebulized twice a day  C. Omeprazole 20mg  one tablet two times per day  D. Ranitidine 300 one time per day - PM  E. Decrease flonase one spray each nostril 3 times per week  2. If Needed;   A. Ventolin HFA 2 puffs or DuoNeb nebulization every 4-6 hours  B. Cetirizine  3. Return in 4 weeks or earlier if problem  Tammy [name] is much better and I will continue to have her use anti-inflammatory agents as noted above for her respiratory tract and also continue to treat reflux. I will see her back in this clinic in 4 weeks or earlier if there is a problem.  Allena Katz, MD [place]

## 2016-06-09 NOTE — Patient Instructions (Signed)
  1. Use the following every day:   A. budesonide 0.5% nebulized twice a day  B. Brovona nebulized twice a day  C. Omeprazole 20mg  one tablet two times per day  D. Ranitidine 300 one time per day - PM  E. Decrease flonase one spray each nostril 3 times per week  2. If Needed;   A. Ventolin HFA 2 puffs or DuoNeb nebulization every 4-6 hours  B. Cetirizine  3. Return in 4 weeks or earlier if problem

## 2016-06-17 ENCOUNTER — Other Ambulatory Visit: Payer: Self-pay | Admitting: *Deleted

## 2016-06-17 MED ORDER — OMEPRAZOLE 20 MG PO CPDR: 20.0000 mg | DELAYED_RELEASE_CAPSULE | Freq: Two times a day (BID) | ORAL | 0 refills | Status: DC

## 2016-06-17 NOTE — Telephone Encounter (Signed)
Patients daughter, Jackelyn Poling called and requested a refill on omeprazole for the patient. She would like to know if the patient needs to be taking this qd or bid. Jackelyn Poling can be reached at [phone] ext [phone]. Thanks, MI

## 2016-06-17 NOTE — Telephone Encounter (Signed)
Rx has been sent to the pharmacy electronically. ° °

## 2016-06-19 DIAGNOSIS — M545 Low back pain: Secondary | ICD-10-CM | POA: Diagnosis not present

## 2016-06-19 DIAGNOSIS — R350 Frequency of micturition: Secondary | ICD-10-CM | POA: Diagnosis not present

## 2016-06-19 DIAGNOSIS — R1032 Left lower quadrant pain: Secondary | ICD-10-CM | POA: Diagnosis not present

## 2016-06-23 DIAGNOSIS — J4540 Moderate persistent asthma, uncomplicated: Secondary | ICD-10-CM | POA: Diagnosis not present

## 2016-07-02 DIAGNOSIS — J4540 Moderate persistent asthma, uncomplicated: Secondary | ICD-10-CM | POA: Diagnosis not present

## 2016-07-06 ENCOUNTER — Ambulatory Visit: Payer: PPO | Admitting: Allergy and Immunology

## 2016-07-19 DIAGNOSIS — R51 Headache: Secondary | ICD-10-CM | POA: Diagnosis not present

## 2016-07-19 DIAGNOSIS — F0390 Unspecified dementia without behavioral disturbance: Secondary | ICD-10-CM | POA: Diagnosis not present

## 2016-07-19 DIAGNOSIS — Z131 Encounter for screening for diabetes mellitus: Secondary | ICD-10-CM | POA: Diagnosis not present

## 2016-07-30 DIAGNOSIS — J4540 Moderate persistent asthma, uncomplicated: Secondary | ICD-10-CM | POA: Diagnosis not present

## 2016-08-02 ENCOUNTER — Ambulatory Visit (INDEPENDENT_AMBULATORY_CARE_PROVIDER_SITE_OTHER): Payer: PPO | Admitting: Cardiovascular Disease

## 2016-08-02 ENCOUNTER — Encounter: Payer: Self-pay | Admitting: Cardiovascular Disease

## 2016-08-02 VITALS — BP 130/60 | HR 78 | Ht 60.0 in | Wt 138.0 lb

## 2016-08-02 DIAGNOSIS — I480 Paroxysmal atrial fibrillation: Secondary | ICD-10-CM | POA: Diagnosis not present

## 2016-08-02 DIAGNOSIS — K219 Gastro-esophageal reflux disease without esophagitis: Secondary | ICD-10-CM

## 2016-08-02 DIAGNOSIS — R413 Other amnesia: Secondary | ICD-10-CM

## 2016-08-02 DIAGNOSIS — I10 Essential (primary) hypertension: Secondary | ICD-10-CM | POA: Diagnosis not present

## 2016-08-02 DIAGNOSIS — I25119 Atherosclerotic heart disease of native coronary artery with unspecified angina pectoris: Secondary | ICD-10-CM | POA: Diagnosis not present

## 2016-08-02 DIAGNOSIS — E785 Hyperlipidemia, unspecified: Secondary | ICD-10-CM

## 2016-08-02 MED ORDER — DILTIAZEM HCL ER COATED BEADS 180 MG PO CP24: 180.0000 mg | ORAL_CAPSULE | Freq: Every day | ORAL | 7 refills | Status: DC

## 2016-08-02 NOTE — Progress Notes (Signed)
Patient ID: Tammy [name], female   DOB: Nov 15, 1930, 83 y.o.   MRN: [id number]     Primary M.D.: Gus Height  HPI: Tammy [name] is a 83 y.o. female who presents to the office today for a 11 month followup cardiology evaluation.   Tammy [name] has established CAD andunderwent CABG surgery in 1994 after she was found to have high-grade left main stenosis. A nuclear perfusion study in June 2011 showed normal perfusion. An echo Doppler study in March 2013 showed mild-to-moderate mitral regurgitation, moderate tricuspid regurgitation, mild-to-moderate pulmonary hypertension with estimated RV systolic pressure 45 mm, mild to moderate LA dilatation, aortic sclerosis/calcification, and an ejection fraction greater than 55%. She has documented right renal artery stenosis on renal duplex scanning with the last scan in November 2013 suggesting a equal or greater than 60% diameter reduction by velocity in the right renal artery. The kidneys are otherwise normal in size, symmetrical in shape and had normal cortex and medulla. She does have a history of  hypertension and hyperlipidemia. In the past she also has had some issues with cellulitis of her lower extremity.   A two-year followup nuclear perfusion study on 11/16/2011; no significant EKG changes were demonstrated. The study was low risk and did not show any area of scar or infarction. This is unchanged from 2 years previously.  She has had issues with lower extremity edema and this improved with reduction of her amlodipine dose to 5 mg from 10 mg. A follow-up echo Doppler study continued to show normal systolic function.  There was mild left atrial dilatation and mild pulmonary hypertension with a PA pressure slightly improved from previously at 38 mm.  She did not have significant valvular abnormality.  A follow-up renal duplex study was unchanged from her prior study of one year previously and showed equal to less than 6% diameter reduction in the right renal  artery.  She had normal patency to the left renal artery.  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   She underwent a follow-up nuclear perfusion study in July 2016.  This was normal without scar or ischemia.  Ejection fraction was 74% and she had normal wall motion.  She did not develop any ECG changes.  As I last saw her in April 2017, she was hospitalized September and in October 2017.  She had episodes of lightheadedness upon standing, nausea, lower abdominal pain and confusion.  He was found to have renal failure acutely and was treated with intravenous fluids.  He had increased to 1.74.  He has a history of atrial fibrillation but was felt not to be a candidate for anticoagulation due to fall risk.  Her beta blocker had been discontinued due to [place] a.  Last admitted to [facility] from October 5 through 02/22/2016.  He was treated with Cardizem.  She had an unremarkable CT of her head and neck.  She tells me she broke her wrist before Christmas.  She has been followed by Dr. Gus Height, for primary care.  He denies episodes of chest tightness.  She presents for evaluation.  Past Medical History:  Diagnosis Date  . Acid reflux   . Asthma    Asthmatic COPD  . Atrial fibrillation ([place]) 01/01/2016  . Coronary artery disease    Echo 07/27/2011   . Coronary artery stenosis    , High-grade left main  . GERD (gastroesophageal reflux disease)   . Hyperlipidemia   . Hypertension   .  IBS (irritable bowel syndrome)   . Psoriasis     Past Surgical History:  Procedure Laterality Date  . CARDIAC SURGERY    . CORONARY ARTERY BYPASS GRAFT  1994   After catheterization showed 90% ostial left main stenosis  . DOPPLER ECHOCARDIOGRAPHY     Study showed mild to moderate MR with moderate TR, mild to moderate pulmonary hypertension with an ejection fraction greater that 55%.   [name] EYE SURGERY    . Nuclear Perfusion  10/2010, 11/15/2012   Showed normal perfusion, No Ischemia or  Infarction, Normal EF  . Renal Scan Duplex  04/09/2012   Showing greater than 50% diameter reduction in the celiac artery and SMA.. Right proximal 275 and Mid 152.  [name] ROTATOR CUFF REPAIR     bilateral  . Sleep Study  12/28/2006   AHI during total sleep time (3h 19 minutes) was 1.81/hr and during REM sleep at 17.78/hr. Mild sleep apnea during REM sleep.Oxygen staturated rate during REM and NREM was 93.0%.    Allergies  Allergen Reactions  . Penicillins Hives    Has patient had a PCN reaction causing immediate rash, facial/tongue/throat swelling, SOB or lightheadedness with hypotension: No Has patient had a PCN reaction causing severe rash involving mucus membranes or skin necrosis: Yes Has patient had a PCN reaction that required hospitalization No Has patient had a PCN reaction occurring within the last 10 years: Yes took again last year 2016 If all of the above answers are "NO", then may proceed with Cephalosporin use.   . Lactose Intolerance (Gi) Other (See Comments)    Unknown- [facility] documented    Current Outpatient Prescriptions  Medication Sig Dispense Refill  . aspirin 81 MG tablet Take 81 mg by mouth daily.     [name] atorvastatin (LIPITOR) 10 MG tablet Take 1 tablet (10 mg total) by mouth at bedtime.    . donepezil (ARICEPT) 10 MG tablet Take 1 tablet (10 mg total) by mouth daily. 90 tablet 1  . DULoxetine (CYMBALTA) 60 MG capsule Take 1 capsule by mouth daily. Reported on 08/05/2015  2  . fluticasone furoate-vilanterol (BREO ELLIPTA) 100-25 MCG/INH AEPB Inhale 1 puff into the lungs daily.    . folic acid (FOLVITE) 1 MG tablet Take 1 tablet (1 mg total) by mouth daily.    . naproxen (NAPROSYN) 500 MG tablet Take 500 mg by mouth 2 (two) times daily with a meal.    . Probiotic Product (PROBIOTIC DAILY) CAPS Take 1 capsule by mouth daily.    . ranitidine (ZANTAC) 300 MG tablet Take 1 tablet (300 mg total) by mouth at bedtime. 90 tablet 3  . diltiazem  (CARDIZEM CD) 180 MG 24 hr capsule Take 1 capsule (180 mg total) by mouth daily. 30 capsule 7   No current facility-administered medications for this visit.     Socially , she is divorced.  She denies any recent tobacco use. She has not been able to exercise regularly.  ROS General: Negative; No fevers, chills, or night sweats; was in for fatigue. HEENT: Negative; No changes in vision or hearing, sinus congestion, difficulty swallowing Pulmonary: Negative; No cough, wheezing, shortness of breath, hemoptysis Cardiovascular: See history of present illness;  GI: Positive for diverticulitis, and frequent diarrhea;  GU: Positive for renal artery stenosis; No dysuria, hematuria, or difficulty voiding Musculoskeletal: Right wrist fracture. Hematologic/Oncology: Negative; no easy bruising, bleeding Endocrine: Negative; no heat/cold intolerance; no diabetes Neuro: Negative; no changes in balance, headaches Skin: Negative;  No rashes or skin lesions Psychiatric: Negative; No behavioral problems, depression Sleep: Negative; No snoring, daytime sleepiness, hypersomnolence, bruxism, restless legs, hypnogognic hallucinations, no cataplexy Other comprehensive 14 point system review is negative.   PE BP 130/60   Pulse 78   Ht 5' (1.524 m)   Wt 138 lb (62.6 kg)   BMI 26.95 kg/m    Repeat blood pressure by me 140/70.  Wt Readings from Last 3 Encounters:  08/02/16 138 lb (62.6 kg)  03/13/16 125 lb (56.7 kg)  02/21/16 125 lb 4.8 oz (56.8 kg)   General: Alert, oriented, no distress.  Skin: normal turgor, no rashes HEENT: Normocephalic, atraumatic. Pupils round and reactive; sclera anicteric;no lid lag.  Nose without nasal septal hypertrophy Mouth/Parynx benign; Mallinpatti scale 2 Neck: No JVD, no carotid bruits with normal carotid upstroke Chest wall: Nontender to palpation Lungs: clear to ausculatation and percussion; no wheezing or rales Heart: RRR, s1 s2 normal 1/6 systolic murmur.  No  S3 or S4 gallop.  No diastolic murmur, rubs, thrills or heaves. Abdomen: soft, nontender; no hepatosplenomehaly, BS+; abdominal aorta nontender and not dilated by palpation. Back: No CVA tenderness Pulses 2+ Extremities: Bilateral lower extremity swelling, left leg greater than right.. no clubbing cyanosis, Homan's sign negative  Neurologic: grossly nonfocal  ECG (independently read by me): Normal sinus rhythm at 78 bpm.  No ST segment changes.  Normal intervals.  April 2017 ECG (independently read by me): Normal sinus rhythm at 68 bpm.  Normal intervals.  No ST segment changes.  July 2016 ECG (independently read by me): Normal sinus rhythm at 70 bpm.  Nonspecific ST changes.  PR interval 178 ms.  QTc interval 429 ms.  November 02, 2013 ECG (independently read by me): Sinus bradycardia 55 beats per minute.  Nonspecific ST-T changes.  QTc interval 430 ms.  Borderline first degree AV block with a PR interval of 202 ms.  Prior September 2014 ECG: Normal sinus rhythm at 68 beats per minute. One isolated PVC. Nonspecific ST changes.  LABS:  BMP Latest Ref Rng & Units 02/21/2016 02/20/2016 02/19/2016  Glucose 65 - 99 mg/dL 108(H) 99 100(H)  BUN 6 - 20 mg/dL 11 12 15   Creatinine 0.44 - 1.00 mg/dL 0.84 0.81 0.92  Sodium 135 - 145 mmol/L 139 142 143  Potassium 3.5 - 5.1 mmol/L 3.6 3.8 4.0  Chloride 101 - 111 mmol/L 111 110 112(H)  CO2 22 - 32 mmol/L 22 22 21(L)  Calcium 8.9 - 10.3 mg/dL 8.7(L) 8.6(L) 9.0   Hepatic Function Latest Ref Rng & Units 02/21/2016 02/20/2016 02/19/2016  Total Protein 6.5 - 8.1 g/dL 5.7(L) 6.1(L) 6.6  Albumin 3.5 - 5.0 g/dL 2.9(L) 3.0(L) 3.5  AST 15 - 41 U/L 24 35 46(H)  ALT 14 - 54 U/L 33 45 52  Alk Phosphatase 38 - 126 U/L 75 69 75  Total Bilirubin 0.3 - 1.2 mg/dL 0.4 0.6 0.4  Bilirubin, Direct 0.1 - 0.5 mg/dL - - -   CBC Latest Ref Rng & Units 02/21/2016 02/20/2016 02/19/2016  WBC 4.0 - 10.5 K/uL 9.7 10.5 10.9(H)  Hemoglobin 12.0 - 15.0 g/dL 10.4(L) 11.2(L) 12.0    Hematocrit 36.0 - 46.0 % 33.9(L) 36.6 38.5  Platelets 150 - 400 K/uL 275 260 289   Lab Results  Component Value Date   MCV 91.6 02/21/2016   MCV 91.5 02/20/2016   MCV 91.4 02/19/2016   Lab Results  Component Value Date   TSH 1.963 02/20/2016   Lab Results  Component Value  Date   HGBA1C  07/12/2009    5.3 (NOTE) The ADA recommends the following therapeutic goal for glycemic control related to Hgb A1c measurement: Goal of therapy: <6.5 Hgb A1c  Reference: American Diabetes Association: Clinical Practice Recommendations 2010, Diabetes Care, 2010, 33: (Suppl  1).   Lipid Panel     Component Value Date/Time   CHOL 118 01/03/2016 0304   CHOL 128 11/09/2012 0805   TRIG 106 01/03/2016 0304   TRIG 84 11/09/2012 0805   HDL 43 01/03/2016 0304   HDL 50 11/09/2012 0805   CHOLHDL 2.7 01/03/2016 0304   VLDL 21 01/03/2016 0304   LDLCALC 54 01/03/2016 0304   LDLCALC 61 11/09/2012 0805    RADIOLOGY: No results found.  IMPRESSION:  1. Coronary artery disease involving native coronary artery of native heart with angina pectoris ([place])   2. Essential hypertension   3. Paroxysmal atrial fibrillation (HCC)   4. Hyperlipidemia with target LDL less than 70   5. Memory loss   6. Gastroesophageal reflux disease without esophagitis     ASSESSMENT AND PLAN: Tammy [name] is an 83 year old white female who is status post her CABG revascularization surgery in 1994. She has had issues with significant fatigue and mild shortness of breath with activity.  An echo Doppler study in June 2015 was not significantly change from previously.  PA pressure is still elevated, but improved at 38 mm compared to 45 mmHg in 2013.  A nuclear perfusion study in July 2016 continued to show normal perfusion without scar or ischemia.  Her last echo Doppler study in May 2017 showed normal LV size with mild focal basal septal hypertrophy.  EF was 60-65%.  She had normal RV size and systolic function and there were no  significant valvular abnormalities.  I have reviewed her recent hospitalizations.  She is maintaining sinus rhythm.  Her blood pressure is mildly elevated and I have suggested slight titration of diltiazem from 120 mg to 180 mg.  She is no longer on beta blocker therapy due to previous bradycardia.  She continues to be on atorvastatin for hyperlipidemia with target LDL less than 70 and in August 2017.  This was 54.  She continues to take ranitidine 300 mg for GERD and is no longer taking omeprazole.  She does have some dementia and is on Aricept.  He continues to be on Cymbalta.  She has had issues with memory loss.  There is no wheezing on exam and she continues to be on Breo.  I will see her in 6 months for reevaluation.  [name], MD, [facility]  08/04/2016 7:47 PM

## 2016-08-02 NOTE — Patient Instructions (Signed)
Your physician has recommended you make the following change in your medication:   1.) the diltiazem has been increased from 120 mg daily to 180 mg daily. A new prescription has been sent to your [facility].  Your physician wants you to follow-up in: 6 months or sooner if needed. You will receive a reminder letter in the mail two months in advance. If you don't receive a letter, please call our office to schedule the follow-up appointment.  If you need a refill on your cardiac medications before your next appointment, please call your pharmacy.

## 2016-08-10 ENCOUNTER — Encounter: Payer: Self-pay | Admitting: Allergy and Immunology

## 2016-08-10 ENCOUNTER — Ambulatory Visit (INDEPENDENT_AMBULATORY_CARE_PROVIDER_SITE_OTHER): Payer: PPO | Admitting: Allergy and Immunology

## 2016-08-10 VITALS — BP 130/70 | HR 84 | Resp 16

## 2016-08-10 DIAGNOSIS — M810 Age-related osteoporosis without current pathological fracture: Secondary | ICD-10-CM | POA: Diagnosis not present

## 2016-08-10 DIAGNOSIS — E782 Mixed hyperlipidemia: Secondary | ICD-10-CM | POA: Diagnosis not present

## 2016-08-10 DIAGNOSIS — J4540 Moderate persistent asthma, uncomplicated: Secondary | ICD-10-CM

## 2016-08-10 DIAGNOSIS — K219 Gastro-esophageal reflux disease without esophagitis: Secondary | ICD-10-CM | POA: Diagnosis not present

## 2016-08-10 DIAGNOSIS — Z0000 Encounter for general adult medical examination without abnormal findings: Secondary | ICD-10-CM | POA: Diagnosis not present

## 2016-08-10 DIAGNOSIS — L400 Psoriasis vulgaris: Secondary | ICD-10-CM | POA: Diagnosis not present

## 2016-08-10 DIAGNOSIS — I10 Essential (primary) hypertension: Secondary | ICD-10-CM | POA: Diagnosis not present

## 2016-08-10 DIAGNOSIS — E559 Vitamin D deficiency, unspecified: Secondary | ICD-10-CM | POA: Diagnosis not present

## 2016-08-10 DIAGNOSIS — J3089 Other allergic rhinitis: Secondary | ICD-10-CM | POA: Diagnosis not present

## 2016-08-10 DIAGNOSIS — F324 Major depressive disorder, single episode, in partial remission: Secondary | ICD-10-CM | POA: Diagnosis not present

## 2016-08-10 DIAGNOSIS — F0390 Unspecified dementia without behavioral disturbance: Secondary | ICD-10-CM | POA: Diagnosis not present

## 2016-08-10 MED ORDER — FLUTICASONE FUROATE-VILANTEROL 100-25 MCG/INH IN AEPB: 1.0000 | INHALATION_SPRAY | Freq: Every day | RESPIRATORY_TRACT | 5 refills | Status: DC

## 2016-08-10 MED ORDER — ALBUTEROL SULFATE HFA 108 (90 BASE) MCG/ACT IN AERS: INHALATION_SPRAY | RESPIRATORY_TRACT | 1 refills | Status: DC

## 2016-08-10 NOTE — Patient Instructions (Addendum)
  1. Use the following every day:   A. Breo 200 one inhalation one time per day  B. Omeprazole 20mg  one tablet two times per day  C. Ranitidine 300 one time per day - PM  D. flonase one spray each nostril 3 times per week  2. If Needed:   A. Ventolin HFA 2 puffs or DuoNeb nebulization every 4-6 hours  B. Cetirizine  3. Return in 12 weeks or earlier if problem

## 2016-08-10 NOTE — Progress Notes (Signed)
Follow-up Note  Referring Provider: [name], MD Primary Provider: Melinda Crutch, MD Date of Office Visit: 08/10/2016  Subjective:   Tammy [name] (DOB: 08-08-1930) is a 83 y.o. female who returns to the [place] on 08/10/2016 in re-evaluation of the following:  HPI: Tammy [name] returns to this clinic in reevaluation of her asthma and reflux-induced respiratory disease. I have not seen her in this clinic since January 2018.  She has discontinued her nebulized steroid and bronchodilator and is now using Breo on a consistent basis. Her requirement for Ventolin at this point in time is 2 times per week. She would rather use the [facility] then use the nebulized formulation of medications. She just feels that the nebulized form of medications takes up to much time of her day. She has not required either a systemic steroid or an antibiotic to treat any respiratory tract issue since his last been seen in this clinic.  She's had no problems with reflux.  Allergies as of 08/10/2016      Reactions   Penicillins Hives   Has patient had a PCN reaction causing immediate rash, facial/tongue/throat swelling, SOB or lightheadedness with hypotension: No Has patient had a PCN reaction causing severe rash involving mucus membranes or skin necrosis: Yes Has patient had a PCN reaction that required hospitalization No Has patient had a PCN reaction occurring within the last 10 years: Yes took again last year 2016 If all of the above answers are "NO", then may proceed with Cephalosporin use.   Lactose Intolerance (gi) Other (See Comments)   Unknown- [facility] of [facility] documented      Medication List      aspirin 81 MG tablet Take 81 mg by mouth daily.   atorvastatin 10 MG tablet Commonly known as:  LIPITOR Take 1 tablet (10 mg total) by mouth at bedtime.   BREO ELLIPTA 100-25 MCG/INH Aepb Generic drug:  fluticasone furoate-vilanterol Inhale 1 puff into the lungs  daily.   diltiazem 180 MG 24 hr capsule Commonly known as:  CARDIZEM CD Take 1 capsule (180 mg total) by mouth daily.   donepezil 10 MG tablet Commonly known as:  ARICEPT Take 1 tablet (10 mg total) by mouth daily.   DULoxetine 60 MG capsule Commonly known as:  CYMBALTA Take 1 capsule by mouth daily. Reported on 2/58/5277   folic acid 1 MG tablet Commonly known as:  FOLVITE Take 1 tablet (1 mg total) by mouth daily.   naproxen 500 MG tablet Commonly known as:  NAPROSYN Take 500 mg by mouth 2 (two) times daily with a meal.   omeprazole 20 MG capsule Commonly known as:  PRILOSEC 2 (two) times daily.   PROBIOTIC DAILY Caps Take 1 capsule by mouth daily.   ranitidine 300 MG tablet Commonly known as:  ZANTAC Take 1 tablet (300 mg total) by mouth at bedtime.       Past Medical History:  Diagnosis Date  . Acid reflux   . Asthma    Asthmatic COPD  . Atrial fibrillation ([place]) 01/01/2016  . Coronary artery disease    Echo 07/27/2011   . Coronary artery stenosis    , High-grade left main  . GERD (gastroesophageal reflux disease)   . Hyperlipidemia   . Hypertension   . IBS (irritable bowel syndrome)   . Psoriasis     Past Surgical History:  Procedure Laterality Date  . CARDIAC SURGERY    . CORONARY ARTERY BYPASS GRAFT  1994   After catheterization showed 90% ostial left main stenosis  . DOPPLER ECHOCARDIOGRAPHY     Study showed mild to moderate MR with moderate TR, mild to moderate pulmonary hypertension with an ejection fraction greater that 55%.   [name] EYE SURGERY    . Nuclear Perfusion  10/2010, 11/15/2012   Showed normal perfusion, No Ischemia or Infarction, Normal EF  . Renal Scan Duplex  04/09/2012   Showing greater than 50% diameter reduction in the celiac artery and SMA.. Right proximal 275 and Mid 152.  [name] ROTATOR CUFF REPAIR     bilateral  . Sleep Study  12/28/2006   AHI during total sleep time (3h 19 minutes) was 1.81/hr and during REM sleep at 17.78/hr.  Mild sleep apnea during REM sleep.Oxygen staturated rate during REM and NREM was 93.0%.    Review of systems negative except as noted in HPI / PMHx or noted below:  Review of Systems  Constitutional: Negative.   HENT: Negative.   Eyes: Negative.   Respiratory: Negative.   Cardiovascular: Negative.   Gastrointestinal: Negative.   Genitourinary: Negative.   Musculoskeletal: Negative.   Skin: Negative.   Neurological: Negative.   Endo/Heme/Allergies: Negative.   Psychiatric/Behavioral: Negative.      Objective:   Vitals:   08/10/16 1526  BP: 130/70  Pulse: 84  Resp: 16          Physical Exam  Constitutional: She is well-developed, well-nourished, and in no distress.  HENT:  Head: Normocephalic.  Right Ear: Tympanic membrane, external ear and ear canal normal.  Left Ear: Tympanic membrane, external ear and ear canal normal.  Nose: Nose normal. No mucosal edema or rhinorrhea.  Mouth/Throat: Uvula is midline, oropharynx is clear and moist and mucous membranes are normal. No oropharyngeal exudate.  Eyes: Conjunctivae are normal.  Neck: Trachea normal. No tracheal tenderness present. No tracheal deviation present. No thyromegaly present.  Cardiovascular: Normal rate, regular rhythm, S1 normal, S2 normal and normal heart sounds.   No murmur heard. Pulmonary/Chest: Breath sounds normal. No stridor. No respiratory distress. She has no wheezes. She has no rales.  Musculoskeletal: She exhibits no edema.  Lymphadenopathy:       Head (right side): No tonsillar adenopathy present.       Head (left side): No tonsillar adenopathy present.    She has no cervical adenopathy.  Neurological: She is alert. Gait normal.  Skin: No rash noted. She is not diaphoretic. No erythema. Nails show no clubbing.  Psychiatric: Mood and affect normal.    Diagnostics:    Spirometry was performed and demonstrated an FEV1 of 1.05 at 66 % of predicted.  Assessment and Plan:   1. Moderate  persistent asthma, uncomplicated   2. Other allergic rhinitis   3. LPRD (laryngopharyngeal reflux disease)     1. Use the following every day:   A. Breo 200 one inhalation one time per day  B. Omeprazole 20mg  one tablet two times per day  C. Ranitidine 300 one time per day - PM  D. flonase one spray each nostril 3 times per week  2. If Needed:   A. Ventolin HFA 2 puffs or DuoNeb nebulization every 4-6 hours  B. Cetirizine  3. Return in 12 weeks or earlier if problem  Overall Cherlyn appears to be doing relatively well at this point in time and we'll keep her on the plan mentioned above which addresses both respiratory tract inflammation with anti-inflammatory agents and reflux with a proton pump inhibitor and  H2 receptor blocker. If she has difficulty during the next 12 weeks she will contact me for further evaluation but otherwise I will see her back in this clinic at 12 weeks.  Allena Katz, MD Allergy / Immunology [place]

## 2016-08-11 DIAGNOSIS — E782 Mixed hyperlipidemia: Secondary | ICD-10-CM | POA: Diagnosis not present

## 2016-08-11 DIAGNOSIS — E559 Vitamin D deficiency, unspecified: Secondary | ICD-10-CM | POA: Diagnosis not present

## 2016-08-11 DIAGNOSIS — M810 Age-related osteoporosis without current pathological fracture: Secondary | ICD-10-CM | POA: Diagnosis not present

## 2016-08-11 DIAGNOSIS — F324 Major depressive disorder, single episode, in partial remission: Secondary | ICD-10-CM | POA: Diagnosis not present

## 2016-08-11 DIAGNOSIS — Z0000 Encounter for general adult medical examination without abnormal findings: Secondary | ICD-10-CM | POA: Diagnosis not present

## 2016-08-11 DIAGNOSIS — F0390 Unspecified dementia without behavioral disturbance: Secondary | ICD-10-CM | POA: Diagnosis not present

## 2016-08-11 DIAGNOSIS — J4540 Moderate persistent asthma, uncomplicated: Secondary | ICD-10-CM | POA: Diagnosis not present

## 2016-08-11 DIAGNOSIS — I10 Essential (primary) hypertension: Secondary | ICD-10-CM | POA: Diagnosis not present

## 2016-08-18 DIAGNOSIS — Z1231 Encounter for screening mammogram for malignant neoplasm of breast: Secondary | ICD-10-CM | POA: Diagnosis not present

## 2016-08-22 DIAGNOSIS — S81812A Laceration without foreign body, left lower leg, initial encounter: Secondary | ICD-10-CM | POA: Diagnosis not present

## 2016-08-22 DIAGNOSIS — S80812A Abrasion, left lower leg, initial encounter: Secondary | ICD-10-CM | POA: Diagnosis not present

## 2016-08-26 ENCOUNTER — Telehealth: Payer: Self-pay

## 2016-08-26 MED ORDER — ALBUTEROL SULFATE HFA 108 (90 BASE) MCG/ACT IN AERS: INHALATION_SPRAY | RESPIRATORY_TRACT | 1 refills | Status: DC

## 2016-08-26 NOTE — Telephone Encounter (Signed)
Insurance only covers [employer]. Rx sent to pharmacy.

## 2016-08-30 ENCOUNTER — Telehealth: Payer: Self-pay | Admitting: Cardiovascular Disease

## 2016-08-30 DIAGNOSIS — J4540 Moderate persistent asthma, uncomplicated: Secondary | ICD-10-CM | POA: Diagnosis not present

## 2016-08-30 NOTE — Telephone Encounter (Signed)
New message      Pt c/o medication issue:  1. Name of Medication: cardizem   2. How are you currently taking this medication (dosage and times per day)? As perscribed   3. Are you having a reaction (difficulty breathing--STAT)? no  4. What is your medication issue? Severe pain in legs , she cant stand

## 2016-08-30 NOTE — Telephone Encounter (Signed)
Spoke with pt, she has not taken the diltiazem for the last 3 days because of leg pain, just to see if it would improve and it has. She reports she restarted the lopressor 50 mg twice daily that she had in the past. She reports feeling a lot better on the lopressor. She is not able to take her bp or pulse at home. Patient made aware the medication was changed due to bradycardia. She does not want to take the diltiazem anymore. She will try to get a monitor to be able to check her bp at home. Will forward to dr Claiborne Billings to review and advise.

## 2016-09-02 ENCOUNTER — Other Ambulatory Visit: Payer: Self-pay | Admitting: Allergy and Immunology

## 2016-09-03 MED ORDER — METOPROLOL TARTRATE 50 MG PO TABS: 50.0000 mg | ORAL_TABLET | Freq: Two times a day (BID) | ORAL | 3 refills | Status: DC

## 2016-09-03 NOTE — Telephone Encounter (Signed)
Spoke with pt, aware of dr [facility] recommendations. New script sent to the pharmacy Patient voiced understanding of the importance in checking her pulse. She will have her dtr check her machine tomorrow and get one if not working right.

## 2016-09-03 NOTE — Telephone Encounter (Signed)
Will need to monitor HR; if HR < 58, decrease metoprolol to 25 mg bid

## 2016-09-14 DIAGNOSIS — Z961 Presence of intraocular lens: Secondary | ICD-10-CM | POA: Diagnosis not present

## 2016-09-14 DIAGNOSIS — H43812 Vitreous degeneration, left eye: Secondary | ICD-10-CM | POA: Diagnosis not present

## 2016-09-14 DIAGNOSIS — H04123 Dry eye syndrome of bilateral lacrimal glands: Secondary | ICD-10-CM | POA: Diagnosis not present

## 2016-09-14 DIAGNOSIS — H26493 Other secondary cataract, bilateral: Secondary | ICD-10-CM | POA: Diagnosis not present

## 2016-09-21 ENCOUNTER — Telehealth: Payer: Self-pay | Admitting: Allergy and Immunology

## 2016-09-21 MED ORDER — FLUTICASONE FUROATE-VILANTEROL 200-25 MCG/INH IN AEPB: 1.0000 | INHALATION_SPRAY | Freq: Every day | RESPIRATORY_TRACT | 2 refills | Status: DC

## 2016-09-21 NOTE — Telephone Encounter (Signed)
Patient has been informed and advised of medication directions. I have explained, in detail, directions for use. She repeated them back. Also, states that her discharge instructions are showing Breo 100 2 inhalations once daily. I did explain that it is never 2 inhalations. She will come to [facility] office 09/22/16 for sample.

## 2016-09-21 NOTE — Telephone Encounter (Signed)
I have spoken with patient. She has a Breo 100 inhaler that shows 1 inhalation 2 times daily. Your last office note shows Breo 200 1 inhalation once daily. The prescription that was sent in for her back in March 2018 was for Breo 100 1 inhalation once daily. Please advise so that I can either get her a sample or I can get the correct prescription sent.  Thank you.

## 2016-09-21 NOTE — Telephone Encounter (Signed)
Memory Dance is ALWAYS one inhalation daily, never twice a day and never two puffs. So Breo 200 one inhaltion one time per day

## 2016-09-21 NOTE — Telephone Encounter (Signed)
Patient called and said the instructions on her Breo tell he to take 2 puffs a day. She said she has never done that, and has always done 1 puff a day and doesn't know why it was changed. She would like a nurse to call her and talk to her about this.

## 2016-09-29 DIAGNOSIS — J4540 Moderate persistent asthma, uncomplicated: Secondary | ICD-10-CM | POA: Diagnosis not present

## 2016-10-21 ENCOUNTER — Other Ambulatory Visit: Payer: Self-pay | Admitting: Cardiovascular Disease

## 2016-10-27 DIAGNOSIS — M545 Low back pain: Secondary | ICD-10-CM | POA: Diagnosis not present

## 2016-10-27 DIAGNOSIS — M25571 Pain in right ankle and joints of right foot: Secondary | ICD-10-CM | POA: Diagnosis not present

## 2016-10-27 DIAGNOSIS — M25551 Pain in right hip: Secondary | ICD-10-CM | POA: Diagnosis not present

## 2016-10-29 DIAGNOSIS — M545 Low back pain: Secondary | ICD-10-CM | POA: Diagnosis not present

## 2016-10-30 DIAGNOSIS — J4540 Moderate persistent asthma, uncomplicated: Secondary | ICD-10-CM | POA: Diagnosis not present

## 2016-11-01 IMAGING — MR MR HEAD W/O CM
7 of 9 series · 39 of 48 positions shown · non-contrast
Comparison: CT HEAD February 14, 2016 and CT HEAD November 22, 2006

CLINICAL DATA: Lightheadedness, worsening confusion, now
incontinent. History of hypertension, coronary artery disease and
atrial fibrillation.

EXAM:
MRI HEAD WITHOUT CONTRAST
TECHNIQUE: Multiplanar, multiecho pulse sequences of the brain and surrounding
structures were obtained without intravenous contrast.

[Series 4: DWI · axial · 3.0mm · 1.09mm/px · z∈[-40,+94]mm · 9 of 92 slices shown (1 of 4)]
[im 1/92]
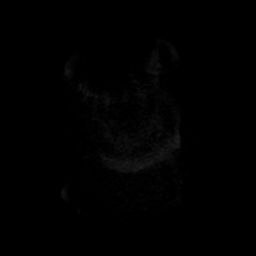
[im 17/92]
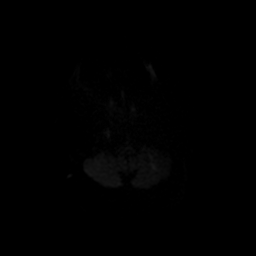
[im 25/92]
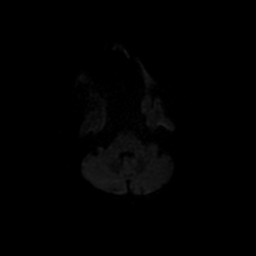
[im 42/92]
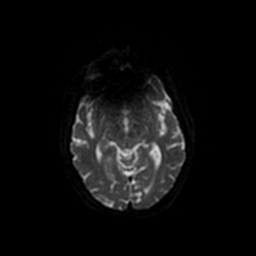
[im 50/92]
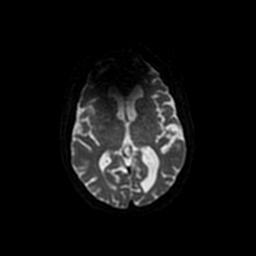
[im 67/92]
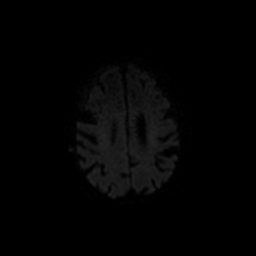
[im 75/92]
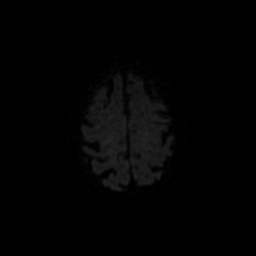
[im 83/92]
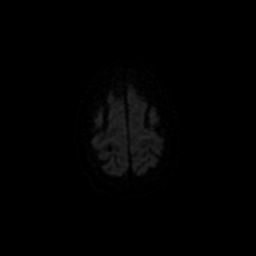
[im 92/92]
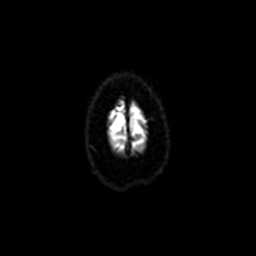

[Series 5: DWI · coronal · 5.0mm · 1.09mm/px · 9 of 67 slices shown (2 of 4)]
[im 1/67]
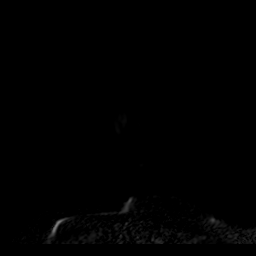
[im 9/67]
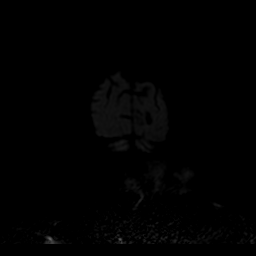
[im 17/67]
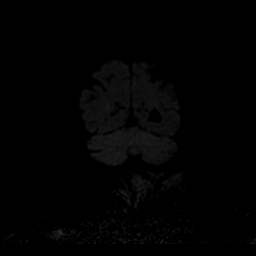
[im 25/67]
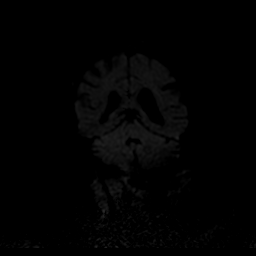
[im 34/67]
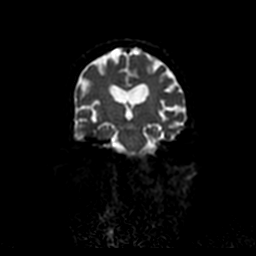
[im 42/67]
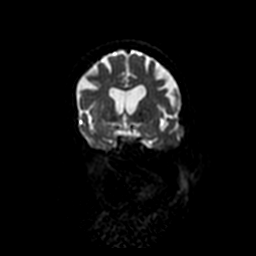
[im 50/67]
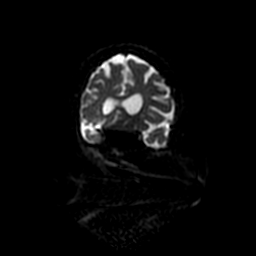
[im 58/67]
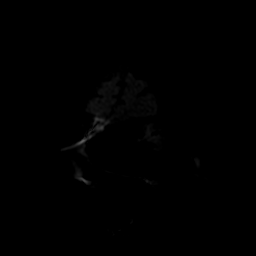
[im 67/67]
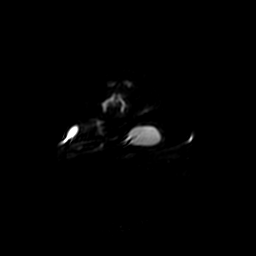

[Series 6: T2 · axial · 5.0mm · 0.43mm/px · z∈[-42,+89]mm · 3 of 23 slices shown]
[im 1/23]
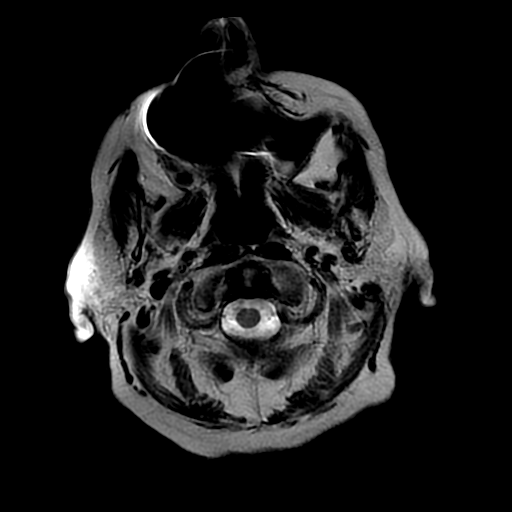
[im 12/23]
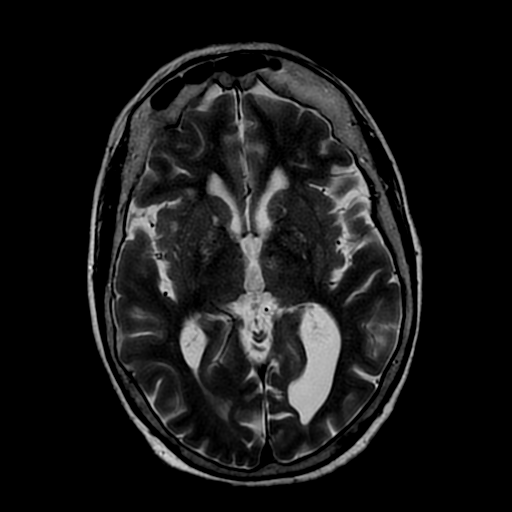
[im 23/23]
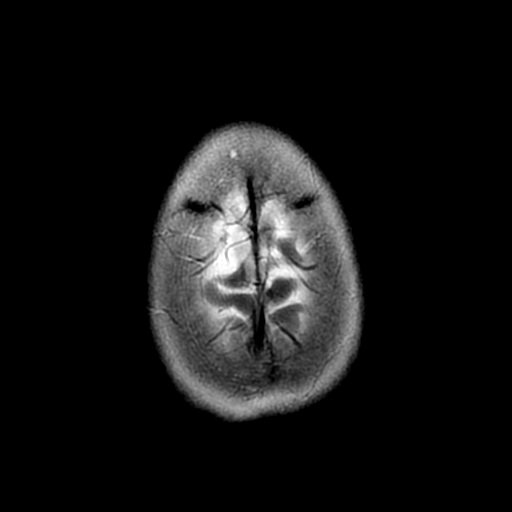

[Series 7: FLAIR · axial · 5.0mm · 0.43mm/px · z∈[-42,+89]mm · 3 of 23 slices shown]
[im 1/23]
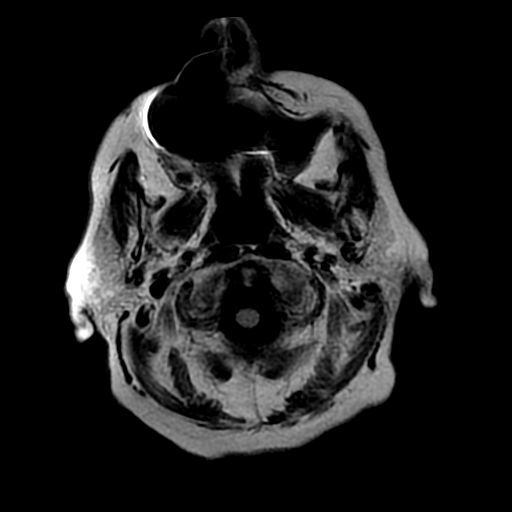
[im 12/23]
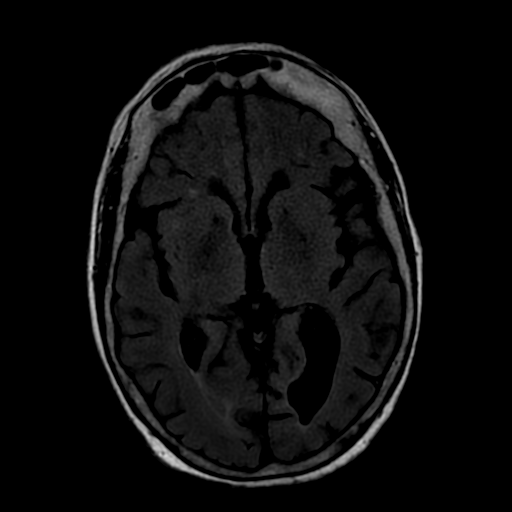
[im 23/23]
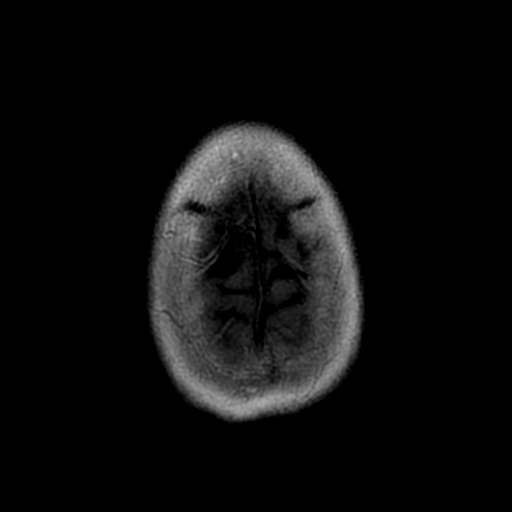

[Series 10: T2 post-contrast · coronal · 5.0mm · 0.45mm/px · 4 of 27 slices shown]
[im 1/27]
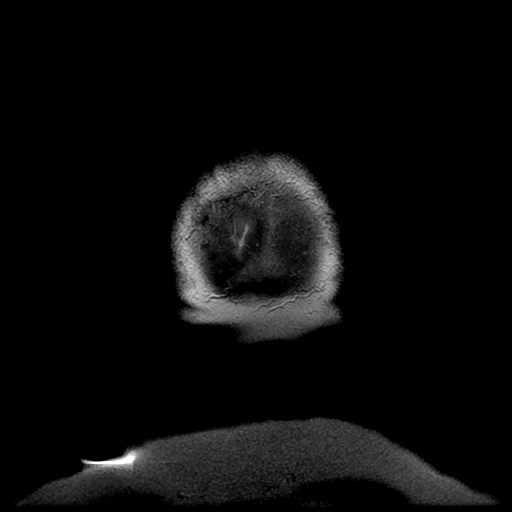
[im 9/27]
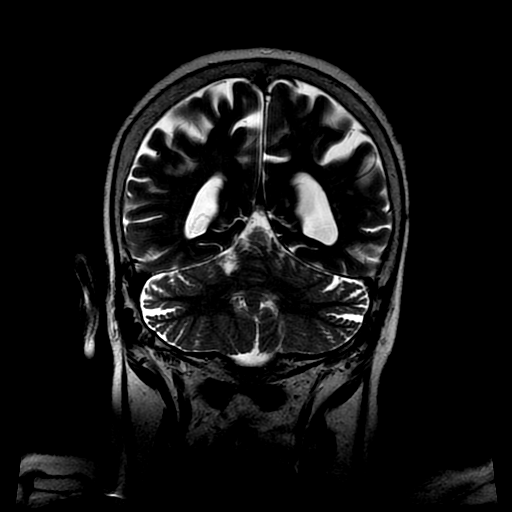
[im 18/27]
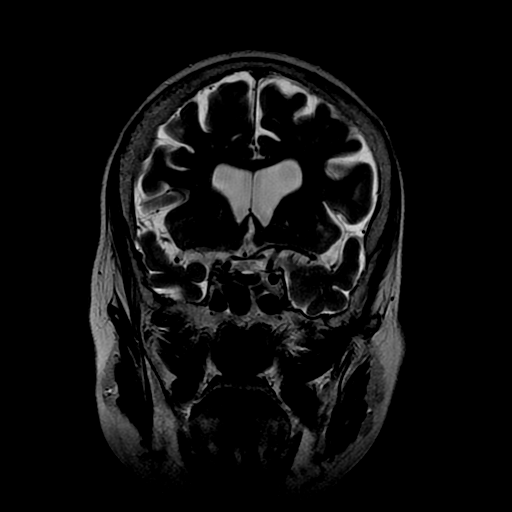
[im 27/27]
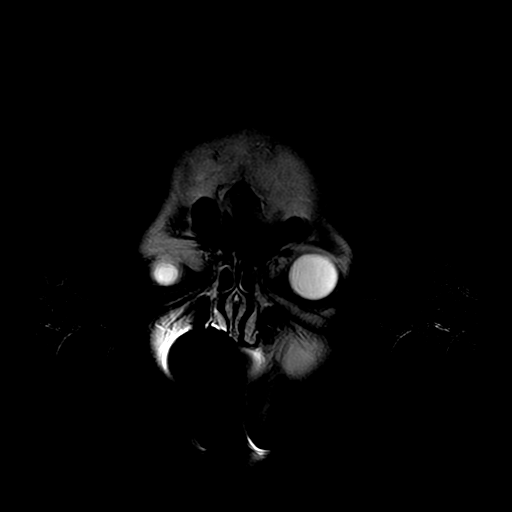

[Series 401: DWI · axial · 3.0mm · 1.09mm/px · z∈[-40,+94]mm · 6 of 45 slices shown (3 of 4)]
[im 1/45]
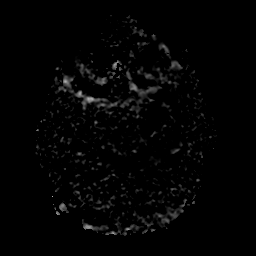
[im 9/45]
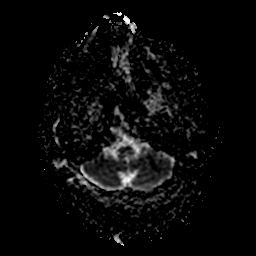
[im 18/45]
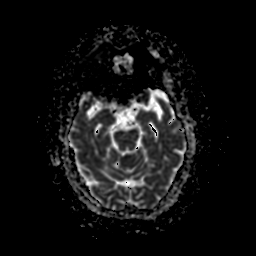
[im 27/45]
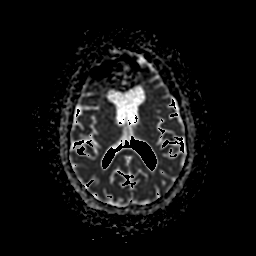
[im 36/45]
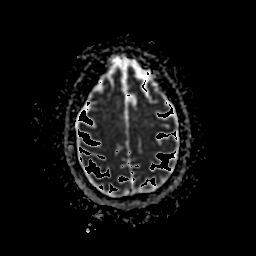
[im 45/45]
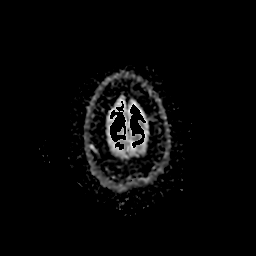

[Series 501: DWI · coronal · 5.0mm · 1.09mm/px · 5 of 34 slices shown (4 of 4)]
[im 1/34]
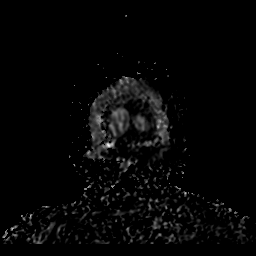
[im 9/34]
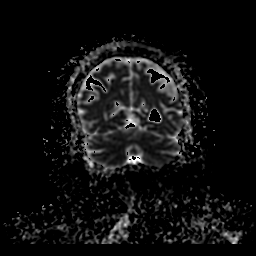
[im 17/34]
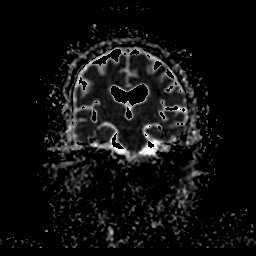
[im 25/34]
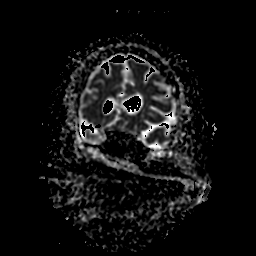
[im 34/34]
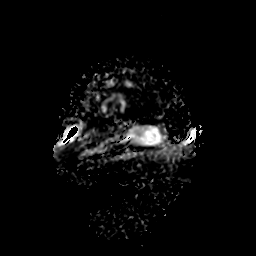

[39 of 48 positions shown; findings below may reference images not displayed]

FINDINGS: BRAIN: No reduced diffusion to suggest acute ischemia. No
susceptibility artifact to suggest hemorrhage. The ventricles and
sulci are normal for patient's age ; chronic asymmetry of occipital
horns. Mild supra tentorial and pontine chronic small vessel
ischemic disease, less than expected for age. No suspicious
parenchymal signal, masses or mass effect. No abnormal extra-axial
fluid collections. No extra-axial masses though, contrast enhanced
sequences would be more sensitive.

VASCULAR: Normal major intracranial vascular flow voids present at
skull base.

SKULL AND UPPER CERVICAL SPINE: No abnormal sellar expansion. No
suspicious calvarial bone marrow signal. Craniocervical junction
maintained.

SINUSES/ORBITS: The mastoid air-cells and included paranasal sinuses
are well-aerated. Status post bilateral ocular lens implants. The
included ocular globes and orbital contents are non-suspicious.

OTHER: None.
IMPRESSION: Negative MRI head for age.

## 2016-11-10 DIAGNOSIS — M19041 Primary osteoarthritis, right hand: Secondary | ICD-10-CM | POA: Diagnosis not present

## 2016-11-10 DIAGNOSIS — S52501P Unspecified fracture of the lower end of right radius, subsequent encounter for closed fracture with malunion: Secondary | ICD-10-CM | POA: Diagnosis not present

## 2016-11-10 DIAGNOSIS — G5603 Carpal tunnel syndrome, bilateral upper limbs: Secondary | ICD-10-CM | POA: Diagnosis not present

## 2016-11-10 DIAGNOSIS — M19042 Primary osteoarthritis, left hand: Secondary | ICD-10-CM | POA: Diagnosis not present

## 2016-11-23 ENCOUNTER — Telehealth: Payer: Self-pay | Admitting: Cardiovascular Disease

## 2016-11-23 NOTE — Telephone Encounter (Signed)
New message   Only wants to talk to Mariann Laster  Please call she doesn't feel bp medication is working.   Pt c/o BP issue: STAT if pt c/o blurred vision, one-sided weakness or slurred speech  1. What are your last 5 BP readings? All she knows its in the 70's her machine is broken so she can not check it   2. Are you having any other symptoms (ex. Dizziness, headache, blurred vision, passed out)? Headaches   3. What is your BP issue? This new medication is not keeping it down , she wants to go back on her old medication.   Doesn't know what medication she is taking

## 2016-11-25 ENCOUNTER — Telehealth: Payer: Self-pay | Admitting: *Deleted

## 2016-11-25 NOTE — Telephone Encounter (Signed)
error 

## 2016-11-25 NOTE — Telephone Encounter (Signed)
Returned a call to patient. She informs me that she feels that the blood pressure readings that she is getting at home are incorrect. She feels that her machine does not work properly. She reports systolic readings of being in the 70's . She denies having any dizziness, chest pain or SOB. I recommended to patient to have her blood pressure checked professionally. She states that it is very hard for her to come to our office due to lack of efficient handicap accessibility. I then recommended that if she does not feel that she can come here to our office, then she will need to go to her PCP. Patient agrees with this recommendation and will contact her PCP.

## 2016-11-26 DIAGNOSIS — R1032 Left lower quadrant pain: Secondary | ICD-10-CM | POA: Diagnosis not present

## 2016-11-26 DIAGNOSIS — K5792 Diverticulitis of intestine, part unspecified, without perforation or abscess without bleeding: Secondary | ICD-10-CM | POA: Diagnosis not present

## 2016-12-02 DIAGNOSIS — L03116 Cellulitis of left lower limb: Secondary | ICD-10-CM | POA: Diagnosis not present

## 2016-12-02 DIAGNOSIS — M545 Low back pain: Secondary | ICD-10-CM | POA: Diagnosis not present

## 2016-12-02 DIAGNOSIS — M5136 Other intervertebral disc degeneration, lumbar region: Secondary | ICD-10-CM | POA: Diagnosis not present

## 2016-12-02 DIAGNOSIS — M5416 Radiculopathy, lumbar region: Secondary | ICD-10-CM | POA: Diagnosis not present

## 2016-12-08 ENCOUNTER — Emergency Department (HOSPITAL_COMMUNITY): Payer: PPO

## 2016-12-08 ENCOUNTER — Encounter (HOSPITAL_COMMUNITY): Payer: Self-pay

## 2016-12-08 ENCOUNTER — Observation Stay (HOSPITAL_COMMUNITY)
Admission: EM | Admit: 2016-12-08 | Discharge: 2016-12-09 | Disposition: A | Discharge status: Home / Self Care | Payer: PPO | Attending: Family Medicine | Admitting: Family Medicine

## 2016-12-08 DIAGNOSIS — R296 Repeated falls: Secondary | ICD-10-CM | POA: Insufficient documentation

## 2016-12-08 DIAGNOSIS — R0602 Shortness of breath: Secondary | ICD-10-CM | POA: Diagnosis not present

## 2016-12-08 DIAGNOSIS — J9601 Acute respiratory failure with hypoxia: Secondary | ICD-10-CM | POA: Diagnosis not present

## 2016-12-08 DIAGNOSIS — K8020 Calculus of gallbladder without cholecystitis without obstruction: Secondary | ICD-10-CM | POA: Insufficient documentation

## 2016-12-08 DIAGNOSIS — Z8249 Family history of ischemic heart disease and other diseases of the circulatory system: Secondary | ICD-10-CM | POA: Insufficient documentation

## 2016-12-08 DIAGNOSIS — R1032 Left lower quadrant pain: Secondary | ICD-10-CM | POA: Diagnosis not present

## 2016-12-08 DIAGNOSIS — R0902 Hypoxemia: Secondary | ICD-10-CM

## 2016-12-08 DIAGNOSIS — Z79899 Other long term (current) drug therapy: Secondary | ICD-10-CM | POA: Insufficient documentation

## 2016-12-08 DIAGNOSIS — Z7982 Long term (current) use of aspirin: Secondary | ICD-10-CM | POA: Insufficient documentation

## 2016-12-08 DIAGNOSIS — E739 Lactose intolerance, unspecified: Secondary | ICD-10-CM | POA: Insufficient documentation

## 2016-12-08 DIAGNOSIS — I700 Atherosclerosis of aorta: Secondary | ICD-10-CM | POA: Diagnosis not present

## 2016-12-08 DIAGNOSIS — J439 Emphysema, unspecified: Secondary | ICD-10-CM | POA: Insufficient documentation

## 2016-12-08 DIAGNOSIS — I10 Essential (primary) hypertension: Secondary | ICD-10-CM | POA: Diagnosis not present

## 2016-12-08 DIAGNOSIS — F1021 Alcohol dependence, in remission: Secondary | ICD-10-CM | POA: Insufficient documentation

## 2016-12-08 DIAGNOSIS — Z87891 Personal history of nicotine dependence: Secondary | ICD-10-CM | POA: Diagnosis not present

## 2016-12-08 DIAGNOSIS — J45901 Unspecified asthma with (acute) exacerbation: Secondary | ICD-10-CM | POA: Insufficient documentation

## 2016-12-08 DIAGNOSIS — R413 Other amnesia: Secondary | ICD-10-CM | POA: Diagnosis not present

## 2016-12-08 DIAGNOSIS — I272 Pulmonary hypertension, unspecified: Secondary | ICD-10-CM | POA: Insufficient documentation

## 2016-12-08 DIAGNOSIS — I2510 Atherosclerotic heart disease of native coronary artery without angina pectoris: Secondary | ICD-10-CM | POA: Insufficient documentation

## 2016-12-08 DIAGNOSIS — Z880 Allergy status to penicillin: Secondary | ICD-10-CM | POA: Diagnosis not present

## 2016-12-08 DIAGNOSIS — Z9889 Other specified postprocedural states: Secondary | ICD-10-CM | POA: Insufficient documentation

## 2016-12-08 DIAGNOSIS — K5730 Diverticulosis of large intestine without perforation or abscess without bleeding: Secondary | ICD-10-CM | POA: Diagnosis not present

## 2016-12-08 DIAGNOSIS — F0390 Unspecified dementia without behavioral disturbance: Secondary | ICD-10-CM | POA: Insufficient documentation

## 2016-12-08 DIAGNOSIS — I081 Rheumatic disorders of both mitral and tricuspid valves: Secondary | ICD-10-CM | POA: Diagnosis not present

## 2016-12-08 DIAGNOSIS — E785 Hyperlipidemia, unspecified: Secondary | ICD-10-CM | POA: Diagnosis not present

## 2016-12-08 DIAGNOSIS — I480 Paroxysmal atrial fibrillation: Secondary | ICD-10-CM | POA: Diagnosis not present

## 2016-12-08 DIAGNOSIS — I517 Cardiomegaly: Secondary | ICD-10-CM | POA: Diagnosis not present

## 2016-12-08 DIAGNOSIS — R109 Unspecified abdominal pain: Secondary | ICD-10-CM | POA: Diagnosis not present

## 2016-12-08 DIAGNOSIS — Z951 Presence of aortocoronary bypass graft: Secondary | ICD-10-CM | POA: Diagnosis not present

## 2016-12-08 DIAGNOSIS — K219 Gastro-esophageal reflux disease without esophagitis: Secondary | ICD-10-CM | POA: Insufficient documentation

## 2016-12-08 DIAGNOSIS — K589 Irritable bowel syndrome without diarrhea: Secondary | ICD-10-CM | POA: Diagnosis not present

## 2016-12-08 DIAGNOSIS — R9431 Abnormal electrocardiogram [ECG] [EKG]: Secondary | ICD-10-CM | POA: Diagnosis not present

## 2016-12-08 LAB — CBC
HCT: 39.1 % (ref 36.0–46.0)
Hemoglobin: 12.3 g/dL (ref 12.0–15.0)
MCH: 25.9 pg — ABNORMAL LOW (ref 26.0–34.0)
MCHC: 31.5 g/dL (ref 30.0–36.0)
MCV: 82.5 fL (ref 78.0–100.0)
Platelets: 317 10*3/uL (ref 150–400)
RBC: 4.74 MIL/uL (ref 3.87–5.11)
RDW: 16.2 % — ABNORMAL HIGH (ref 11.5–15.5)
WBC: 13.5 10*3/uL — ABNORMAL HIGH (ref 4.0–10.5)

## 2016-12-08 LAB — URINALYSIS, ROUTINE W REFLEX MICROSCOPIC
Bilirubin Urine: NEGATIVE
Glucose, UA: NEGATIVE mg/dL
Hgb urine dipstick: NEGATIVE
Ketones, ur: NEGATIVE mg/dL
Nitrite: NEGATIVE
Protein, ur: NEGATIVE mg/dL
Specific Gravity, Urine: 1.014 (ref 1.005–1.030)
pH: 7 (ref 5.0–8.0)

## 2016-12-08 LAB — COMPREHENSIVE METABOLIC PANEL
ALT: 18 U/L (ref 14–54)
AST: 22 U/L (ref 15–41)
Albumin: 3.3 g/dL — ABNORMAL LOW (ref 3.5–5.0)
Alkaline Phosphatase: 78 U/L (ref 38–126)
Anion gap: 10 (ref 5–15)
BUN: 14 mg/dL (ref 6–20)
CO2: 23 mmol/L (ref 22–32)
Calcium: 8.7 mg/dL — ABNORMAL LOW (ref 8.9–10.3)
Chloride: 103 mmol/L (ref 101–111)
Creatinine, Ser: 0.88 mg/dL (ref 0.44–1.00)
GFR calc Af Amer: >60 mL/min (ref >60)
GFR calc non Af Amer: 59 mL/min — ABNORMAL LOW (ref >60)
Glucose, Bld: 96 mg/dL (ref 65–99)
Potassium: 4.4 mmol/L (ref 3.5–5.1)
Sodium: 136 mmol/L (ref 135–145)
Total Bilirubin: 0.4 mg/dL (ref 0.3–1.2)
Total Protein: 6.5 g/dL (ref 6.5–8.1)

## 2016-12-08 LAB — I-STAT TROPONIN, ED: Troponin i, poc: 0 ng/mL (ref 0.00–0.08)

## 2016-12-08 LAB — LIPASE, BLOOD: Lipase: 31 U/L (ref 11–51)

## 2016-12-08 MED ORDER — SODIUM CHLORIDE 0.9 % IV BOLUS (SEPSIS)
1000.0000 mL | Freq: Once | INTRAVENOUS | Status: AC
  Administered 2016-12-08: 1000 mL via INTRAVENOUS

## 2016-12-08 MED ORDER — ONDANSETRON HCL 4 MG/2ML IJ SOLN
4.0000 mg | Freq: Once | INTRAMUSCULAR | Status: AC
  Administered 2016-12-08: 4 mg via INTRAVENOUS
  Filled 2016-12-08: qty 2

## 2016-12-08 MED ORDER — IPRATROPIUM BROMIDE 0.02 % IN SOLN
0.5000 mg | Freq: Once | RESPIRATORY_TRACT | Status: AC
  Administered 2016-12-08: 0.5 mg via RESPIRATORY_TRACT
  Filled 2016-12-08: qty 2.5

## 2016-12-08 MED ORDER — MORPHINE SULFATE (PF) 4 MG/ML IV SOLN
4.0000 mg | Freq: Once | INTRAVENOUS | Status: AC
Start: 2016-12-08 — End: 2016-12-08
  Administered 2016-12-08: 4 mg via INTRAVENOUS
  Filled 2016-12-08: qty 1

## 2016-12-08 MED ORDER — IOPAMIDOL (ISOVUE-300) INJECTION 61%
INTRAVENOUS | Status: AC
  Administered 2016-12-08: 100 mL
  Filled 2016-12-08: qty 100

## 2016-12-08 MED ORDER — ALBUTEROL SULFATE (2.5 MG/3ML) 0.083% IN NEBU
5.0000 mg | INHALATION_SOLUTION | Freq: Once | RESPIRATORY_TRACT | Status: AC
  Administered 2016-12-08: 5 mg via RESPIRATORY_TRACT
  Filled 2016-12-08: qty 6

## 2016-12-08 MED ORDER — METHYLPREDNISOLONE SODIUM SUCC 125 MG IJ SOLR
125.0000 mg | Freq: Once | INTRAMUSCULAR | Status: AC
  Administered 2016-12-09: 125 mg via INTRAVENOUS
  Filled 2016-12-08: qty 2

## 2016-12-08 NOTE — ED Notes (Signed)
Patient transported to radiology

## 2016-12-08 NOTE — ED Triage Notes (Signed)
Pt presents to the ed for complaints of lower abdominal pain. She has been on antibiotics for diverticulitis and is not feeling better so her doctor sent her here for treatment. Denies any other complaints.

## 2016-12-08 NOTE — ED Notes (Signed)
Patient transported to CT 

## 2016-12-08 NOTE — ED Provider Notes (Signed)
[facility] DEPT Provider Note   CSN: [id number] Arrival date & time: 12/08/16  1414     History   Chief Complaint Chief Complaint  Patient presents with  . Abdominal Pain    HPI Tammy [name] is a 83 y.o. female hx of asthma, afib, CAD, GERD, HTN here with Shortness of breath, abdominal pain. Patient had shortness of breath is going on for the last 2-3 weeks.  Patient was diagnosed with diverticulitis about a week ago and is on Cipro, Flagyl. Patient also was diagnosed with cellulitis of the left leg and was put on Keflex several days ago as well. Patient has persistent left lower quadrant abdominal pain for the last several days. Patient was sent in by urgent care for evaluation of abdominal pain.    The history is provided by the patient.    Past Medical History:  Diagnosis Date  . Acid reflux   . Asthma    Asthmatic COPD  . Atrial fibrillation ([place]) 01/01/2016  . Coronary artery disease    Echo 07/27/2011   . Coronary artery stenosis    , High-grade left main  . GERD (gastroesophageal reflux disease)   . Hyperlipidemia   . Hypertension   . IBS (irritable bowel syndrome)   . Psoriasis     Patient Active Problem List   Diagnosis Date Noted  . Hypothermia 02/20/2016  . SIRS (systemic inflammatory response syndrome) ([place]) 02/19/2016  . Diverticulosis   . Encephalopathy   . Hyponatremia 02/14/2016  . Acute encephalopathy 02/14/2016  . Elevated lactic acid level 02/14/2016  . Dizziness 02/14/2016  . Abdominal pain 02/14/2016  . Symptomatic bradycardia 02/12/2016  . Leukocytosis 02/12/2016  . Depression 02/12/2016  . Renal insufficiency   . SOB (shortness of breath)   . Bradycardia on ECG 02/11/2016  . Atrial fibrillation with RVR ([place]) 01/01/2016  . PAF (paroxysmal atrial fibrillation) ([place]) 01/01/2016  . Alcohol dependence with withdrawal with complication ([place]) 54/00/8676  . Fall 01/01/2016  . AKI (acute kidney injury) ([place])   . Dehydration   .  Exertional dyspnea 09/11/2015  . Memory loss 11/13/2013  . Abnormal involuntary movements(781.0) 11/13/2013  . Pulmonary hypertension ([place]) 11/02/2013  . Generalized weakness 11/02/2013  . Coronary artery disease involving native coronary artery of native heart with angina pectoris ([place]) 11/16/2012  . Mitral regurgitation 11/16/2012  . Renal artery stenosis ([place]) 11/16/2012  . Hyperlipidemia with target LDL less than 70 11/16/2012  . Essential hypertension 11/16/2012    Past Surgical History:  Procedure Laterality Date  . CARDIAC SURGERY    . CORONARY ARTERY BYPASS GRAFT  1994   After catheterization showed 90% ostial left main stenosis  . DOPPLER ECHOCARDIOGRAPHY     Study showed mild to moderate MR with moderate TR, mild to moderate pulmonary hypertension with an ejection fraction greater that 55%.   [name] EYE SURGERY    . Nuclear Perfusion  10/2010, 11/15/2012   Showed normal perfusion, No Ischemia or Infarction, Normal EF  . Renal Scan Duplex  04/09/2012   Showing greater than 50% diameter reduction in the celiac artery and SMA.. Right proximal 275 and Mid 152.  [name] ROTATOR CUFF REPAIR     bilateral  . Sleep Study  12/28/2006   AHI during total sleep time (3h 19 minutes) was 1.81/hr and during REM sleep at 17.78/hr. Mild sleep apnea during REM sleep.Oxygen staturated rate during REM and NREM was 93.0%.    OB History    No data available  Home Medications    Prior to Admission medications   Medication Sig Start Date End Date Taking? Authorizing Provider  acetaminophen (TYLENOL) 325 MG tablet Take 650 mg by mouth 2 (two) times daily.   Yes [provider]  albuterol (PROAIR HFA) 108 (90 Base) MCG/ACT inhaler Inhale 2 puffs every 4-6 hours as needed for cough or wheeze. 08/26/16  Yes Kozlow, Donnamarie Poag, MD  aspirin 81 MG tablet Take 81 mg by mouth daily.    Yes [provider]  atorvastatin (LIPITOR) 10 MG tablet Take 1 tablet (10 mg total) by mouth at bedtime.  01/06/16  Yes Rai, Ripudeep K, MD  cephALEXin (KEFLEX) 500 MG capsule Take 500 mg by mouth every 6 (six) hours. 12/02/16  Yes [provider]  donepezil (ARICEPT) 10 MG tablet Take 1 tablet (10 mg total) by mouth daily. 07/13/14  Yes Marcial Pacas, MD  DULoxetine (CYMBALTA) 60 MG capsule Take 60 mg by mouth daily. Reported on 08/05/2015 09/13/14  Yes [provider]  fluticasone (FLONASE) 50 MCG/ACT nasal spray USE ONE TO TWO SPRAY(S) IN EACH NOSTRIL ONCE DAILY FOR  STUFFY  NOSE  OR  DRAINAGE Patient taking differently: USE ONE TO TWO SPRAY(S) IN EACH NOSTRIL DAILY AS NEEDED FOR  STUFFY  NOSE  OR  DRAINAGE 09/02/16  Yes Kozlow, Donnamarie Poag, MD  fluticasone furoate-vilanterol (BREO ELLIPTA) 200-25 MCG/INH AEPB Inhale 1 puff into the lungs daily. 09/21/16  Yes Kozlow, Donnamarie Poag, MD  folic acid (FOLVITE) 1 MG tablet Take 1 tablet (1 mg total) by mouth daily. 01/06/16  Yes Rai, Ripudeep K, MD  metoprolol tartrate (LOPRESSOR) 50 MG tablet Take 1 tablet (50 mg total) by mouth 2 (two) times daily. 09/03/16 12/08/16 Yes [name], MD  Multiple Vitamins-Minerals (MULTIVITAMIN WITH MINERALS) tablet Take 1 tablet by mouth daily.   Yes [provider]  Omega-3 Fatty Acids (FISH OIL) 1000 MG CAPS Take 1 capsule by mouth daily.   Yes [provider]  omeprazole (PRILOSEC) 20 MG capsule Take 20 mg by mouth 2 (two) times daily.  08/07/16  Yes [provider]  Probiotic Product (PROBIOTIC DAILY) CAPS Take 1 capsule by mouth daily.   Yes [provider]  ranitidine (ZANTAC) 300 MG tablet TAKE ONE TABLET BY MOUTH AT BEDTIME Patient taking differently: TAKE 300MG  BY MOUTH AT BEDTIME 10/22/16  Yes [name], MD  traMADol (ULTRAM) 50 MG tablet Take 50 mg by mouth daily as needed for pain. 11/23/16  Yes [provider]    Family History Family History  Problem Relation Age of Onset  . Heart disease Mother   . Hypertension Sister     Social History Social History    Substance Use Topics  . Smoking status: Former Smoker    Quit date: 11/08/1962  . Smokeless tobacco: Never Used  . Alcohol use 0.0 oz/week     Comment: drinks wine/beer daily     Allergies   Penicillins and Lactose intolerance (gi)   Review of Systems Review of Systems  Respiratory: Positive for shortness of breath.   Gastrointestinal: Positive for abdominal pain.  All other systems reviewed and are negative.    Physical Exam Updated Vital Signs BP (!) 150/58   Pulse 69   Temp 98.2 F (36.8 C) (Oral)   Resp 19   Wt 62.6 kg (138 lb)   SpO2 100%   BMI 26.95 kg/m   Physical Exam  Constitutional: She is oriented to person, place, and time.  Uncomfortable   HENT:  Head: Normocephalic.  Mouth/Throat: Oropharynx is clear and moist.  Eyes: Pupils are equal, round, and reactive to light. Conjunctivae and EOM are normal.  Neck: Normal range of motion. Neck supple.  Cardiovascular: Normal rate, regular rhythm and normal heart sounds.   Pulmonary/Chest:  Diminished bilateral bases   Abdominal: Soft. Bowel sounds are normal. She exhibits no distension. There is no tenderness. There is no guarding.  Musculoskeletal: Normal range of motion.  Mild L leg erythema, no obvious cellulitis   Neurological: She is alert and oriented to person, place, and time. No cranial nerve deficit. Coordination normal.  Skin: Skin is warm.  Psychiatric: She has a normal mood and affect.  Nursing note and vitals reviewed.    ED Treatments / Results  Labs (all labs ordered are listed, but only abnormal results are displayed) Labs Reviewed  COMPREHENSIVE METABOLIC PANEL - Abnormal; Notable for the following:       Result Value   Calcium 8.7 (*)    Albumin 3.3 (*)    GFR calc non Af Amer 59 (*)    All other components within normal limits  CBC - Abnormal; Notable for the following:    WBC 13.5 (*)    MCH 25.9 (*)    RDW 16.2 (*)    All other components within normal limits  URINALYSIS,  ROUTINE W REFLEX MICROSCOPIC - Abnormal; Notable for the following:    Leukocytes, UA SMALL (*)    Bacteria, UA RARE (*)    Squamous Epithelial / LPF 0-5 (*)    All other components within normal limits  LIPASE, BLOOD  I-STAT TROPONIN, ED    EKG  EKG Interpretation None       Radiology Ct Abdomen Pelvis W Contrast  Result Date: 12/08/2016 CLINICAL DATA:  Left lower quadrant abdominal pain for 2 weeks. EXAM: CT ABDOMEN AND PELVIS WITH CONTRAST TECHNIQUE: Multidetector CT imaging of the abdomen and pelvis was performed using the standard protocol following bolus administration of intravenous contrast. CONTRAST:  123mL ISOVUE-300 IOPAMIDOL (ISOVUE-300) INJECTION 61% COMPARISON:  CT scan of February 15, 2016. FINDINGS: Lower chest: No acute abnormality. Hepatobiliary: Cholelithiasis is noted. Intrahepatic and extrahepatic biliary dilatation is noted. Otherwise the liver appears unremarkable. Pancreas: Unremarkable. No pancreatic ductal dilatation or surrounding inflammatory changes. Spleen: Normal in size without focal abnormality. Adrenals/Urinary Tract: Adrenal glands are unremarkable. Kidneys are normal, without renal calculi, focal lesion, or hydronephrosis. Bladder is unremarkable. Stomach/Bowel: Stomach is within normal limits. Appendix appears normal. No evidence of bowel wall thickening, distention, or inflammatory changes. Sigmoid diverticulosis is noted without inflammation. Stool is noted throughout the colon. Vascular/Lymphatic: Aortic atherosclerosis. No enlarged abdominal or pelvic lymph nodes. Reproductive: Uterus and bilateral adnexa are unremarkable. Other: No abdominal wall hernia or abnormality. No abdominopelvic ascites. Musculoskeletal: No acute or significant osseous findings. IMPRESSION: Cholelithiasis is noted without inflammation. There is noted mild intrahepatic and extrahepatic biliary dilatation which is increased compared to prior exam. Correlation with liver function tests  is recommended to rule out distal common bile duct obstruction. Aortic atherosclerosis. Sigmoid diverticulosis without inflammation. Stool is noted throughout the colon. Electronically Signed   By: Marijo Conception, M.D.   On: 12/08/2016 18:48    Procedures Procedures (including critical care time)  Medications Ordered in ED Medications  albuterol (PROVENTIL) (2.5 MG/3ML) 0.083% nebulizer solution 5 mg (not administered)  ipratropium (ATROVENT) nebulizer solution 0.5 mg (not administered)  sodium chloride 0.9 % bolus 1,000 mL (1,000 mLs Intravenous New Bag/Given  12/08/16 1736)  morphine 4 MG/ML injection 4 mg (4 mg Intravenous Given 12/08/16 1737)  ondansetron (ZOFRAN) injection 4 mg (4 mg Intravenous Given 12/08/16 1737)  iopamidol (ISOVUE-300) 61 % injection (100 mLs  Contrast Given 12/08/16 1806)     Initial Impression / Assessment and Plan / ED Course  I have reviewed the triage vital signs and the nursing notes.  Pertinent labs & imaging results that were available during my care of the patient were reviewed by me and considered in my medical decision making (see chart for details).     Tammy [name] is a 83 y.o. female here with shortness of breath, abdominal pain. Hx of diverticulitis so I think likely diverticulitis vs abscess. Some shortness for several days. O2 normal in triage, likely mild asthma exacerbation.   9 pm CT ab/pel unremarkable. O2 dropped to 83% on RA. Will get CT to r/o PE. Will give nebs.   11:52 PM CTA showed no PE. Still hypoxic 88%. Placed on 2 L [place], O2 94%. Will admit.    Final Clinical Impressions(s) / ED Diagnoses   Final diagnoses:  None    New Prescriptions New Prescriptions   No medications on file     Drenda Freeze, MD 12/08/16 2353

## 2016-12-08 NOTE — ED Notes (Signed)
Returned from ct scan 

## 2016-12-08 NOTE — H&P (Signed)
History and Physical    Tammy [name] VXB:[id number] DOB: 02/23/1931 DOA: 12/08/2016  PCP: [name], MD Consultants:  Neldon Mc - allergy; Claiborne Billings - cardiology; Percell Miller - orthopedics; [facility] Patient coming from: Home - lives alone; [place]: daughter, [phone]  Chief Complaint: abdominal pain  HPI: Tammy [name] is a 83 y.o. female with medical history significant of coronary artery disease, hypertension, paroxysmal atrial fibrillation not a candidate for anticoagulation due to falls, GERD, dementia, and ETOH dependence in remission presenting for abdominal pain.  Patient saw her doctor today because she was diagnosed with diverticulitis last week but she was still having abdominal pain despite finishing medication (Cipro/Flagyl).  Dr. Sabra Heck saw her today and sent her in to the ER for possible CT.  Currently feeling ok other than LLE discomfort, had also been recently treated for cellulitis.  She has chronic SOB with asthma.  +cough occasionally, minimally poroductive.  No fevers.  SOB is worse with exertion and is chronic.  She hasn't been in pain since she got here.  Her c/o pain is R groin.   ED Course: Abdominal pain was reason for visit but she had a negative CT and meanwhile dropped O2 sats to 83% on RA.  CTA negative for PE, ?COPD.  Placed on 2L O2.  Review of Systems: As per HPI; otherwise review of systems reviewed and negative.   Ambulatory Status:  Ambulates with a cane or walker; does not need assistance in the house  Past Medical History:  Diagnosis Date  . Acid reflux   . Asthma    Asthmatic COPD  . Atrial fibrillation ([place]) 01/01/2016  . Coronary artery disease    Echo 07/27/2011   . Coronary artery stenosis    , High-grade left main  . GERD (gastroesophageal reflux disease)   . Hyperlipidemia   . Hypertension   . IBS (irritable bowel syndrome)   . Psoriasis     Past Surgical History:  Procedure Laterality Date  . CARDIAC SURGERY    . CORONARY ARTERY BYPASS  GRAFT  1994   After catheterization showed 90% ostial left main stenosis  . DOPPLER ECHOCARDIOGRAPHY     Study showed mild to moderate MR with moderate TR, mild to moderate pulmonary hypertension with an ejection fraction greater that 55%.   [name] EYE SURGERY    . Nuclear Perfusion  10/2010, 11/15/2012   Showed normal perfusion, No Ischemia or Infarction, Normal EF  . Renal Scan Duplex  04/09/2012   Showing greater than 50% diameter reduction in the celiac artery and SMA.. Right proximal 275 and Mid 152.  [name] ROTATOR CUFF REPAIR     bilateral  . Sleep Study  12/28/2006   AHI during total sleep time (3h 19 minutes) was 1.81/hr and during REM sleep at 17.78/hr. Mild sleep apnea during REM sleep.Oxygen staturated rate during REM and NREM was 93.0%.    Social History   Social History  . Marital status: Single    Spouse name: N/A  . Number of children: N/A  . Years of education: N/A   Occupational History  . retired    Social History Main Topics  . Smoking status: Former Smoker    Packs/day: 1.00    Years: 18.00    Quit date: 11/08/1962  . Smokeless tobacco: Never Used  . Alcohol use No     Comment: h/o heavy use - 2 bottles of wine daily  . Drug use: No  . Sexual activity: No   Other Topics  Concern  . Not on file   Social History Narrative   Patient lives at home alone.    Patient is divorced.    Patient has one child.    Patient has a high school education.    Patient quit tobacco 40 years ago.    Patient drinks coffee and tea daily.     Allergies  Allergen Reactions  . Penicillins Hives    Has patient had a PCN reaction causing immediate rash, facial/tongue/throat swelling, SOB or lightheadedness with hypotension: No Has patient had a PCN reaction causing severe rash involving mucus membranes or skin necrosis: Yes Has patient had a PCN reaction that required hospitalization No Has patient had a PCN reaction occurring within the last 10 years: Yes took again last year  2016 If all of the above answers are "NO", then may proceed with Cephalosporin use.   . Lactose Intolerance (Gi) Other (See Comments)    Unknown- [facility] documented    Family History  Problem Relation Age of Onset  . Heart disease Mother   . Hypertension Sister     Prior to Admission medications   Medication Sig Start Date End Date Taking? Authorizing Provider  acetaminophen (TYLENOL) 325 MG tablet Take 650 mg by mouth 2 (two) times daily.   Yes [provider]  albuterol (PROAIR HFA) 108 (90 Base) MCG/ACT inhaler Inhale 2 puffs every 4-6 hours as needed for cough or wheeze. 08/26/16  Yes Kozlow, Donnamarie Poag, MD  aspirin 81 MG tablet Take 81 mg by mouth daily.    Yes [provider]  atorvastatin (LIPITOR) 10 MG tablet Take 1 tablet (10 mg total) by mouth at bedtime. 01/06/16  Yes Rai, Ripudeep K, MD  cephALEXin (KEFLEX) 500 MG capsule Take 500 mg by mouth every 6 (six) hours. 12/02/16  Yes [provider]  donepezil (ARICEPT) 10 MG tablet Take 1 tablet (10 mg total) by mouth daily. 07/13/14  Yes Marcial Pacas, MD  DULoxetine (CYMBALTA) 60 MG capsule Take 60 mg by mouth daily. Reported on 08/05/2015 09/13/14  Yes [provider]  fluticasone (FLONASE) 50 MCG/ACT nasal spray USE ONE TO TWO SPRAY(S) IN EACH NOSTRIL ONCE DAILY FOR  STUFFY  NOSE  OR  DRAINAGE Patient taking differently: USE ONE TO TWO SPRAY(S) IN EACH NOSTRIL DAILY AS NEEDED FOR  STUFFY  NOSE  OR  DRAINAGE 09/02/16  Yes Kozlow, Donnamarie Poag, MD  fluticasone furoate-vilanterol (BREO ELLIPTA) 200-25 MCG/INH AEPB Inhale 1 puff into the lungs daily. 09/21/16  Yes Kozlow, Donnamarie Poag, MD  folic acid (FOLVITE) 1 MG tablet Take 1 tablet (1 mg total) by mouth daily. 01/06/16  Yes Rai, Ripudeep K, MD  metoprolol tartrate (LOPRESSOR) 50 MG tablet Take 1 tablet (50 mg total) by mouth 2 (two) times daily. 09/03/16 12/08/16 Yes [name], MD  Multiple Vitamins-Minerals (MULTIVITAMIN WITH MINERALS) tablet  Take 1 tablet by mouth daily.   Yes [provider]  Omega-3 Fatty Acids (FISH OIL) 1000 MG CAPS Take 1 capsule by mouth daily.   Yes [provider]  omeprazole (PRILOSEC) 20 MG capsule Take 20 mg by mouth 2 (two) times daily.  08/07/16  Yes [provider]  Probiotic Product (PROBIOTIC DAILY) CAPS Take 1 capsule by mouth daily.   Yes [provider]  ranitidine (ZANTAC) 300 MG tablet TAKE ONE TABLET BY MOUTH AT BEDTIME Patient taking differently: TAKE 300MG  BY MOUTH AT BEDTIME 10/22/16  Yes [name], MD  traMADol (  ULTRAM) 50 MG tablet Take 50 mg by mouth daily as needed for pain. 11/23/16  Yes [provider]    Physical Exam: Vitals:   12/09/16 0001 12/09/16 0030 12/09/16 0139 12/09/16 0230  BP: (!) 179/85 128/64 (!) 173/58   Pulse: 84 71 90   Resp: 19 (!) 23 20   Temp:   98.4 F (36.9 C)   TempSrc:   Oral   SpO2: 98% 100% 100% 100%  Weight:         General:  Appears calm and comfortable and is NAD, somewhat disheveled Eyes:  PERRL, EOMI, normal lids, iris ENT:  grossly normal hearing, lips & tongue, mmm Neck:  no LAD, masses or thyromegaly Cardiovascular:  RRR, no m/r/g. No LE edema.  Respiratory:  CTA bilaterally, no w/r/r. Normal respiratory effort. Abdomen:  soft, ntnd, NABS. Skin:  no rash or induration seen on limited exam Musculoskeletal:  grossly normal tone BUE/BLE, good ROM, no bony abnormality.  Her pain appears to be in the right hip/groin region; no defect is appreciated on brief exam. Psychiatric:  grossly normal mood and affect, speech fluent and appropriate, AOx3 Neurologic:  CN 2-12 grossly intact, moves all extremities in coordinated fashion, sensation intact  Labs on Admission: I have personally reviewed following labs and imaging studies  CBC:  Recent Labs Lab 12/08/16 1502  WBC 13.5*  HGB 12.3  HCT 39.1  MCV 82.5  PLT 767   Basic Metabolic Panel:  Recent Labs Lab 12/08/16 1502  NA 136  K 4.4   CL 103  CO2 23  GLUCOSE 96  BUN 14  CREATININE 0.88  CALCIUM 8.7*   GFR: Estimated Creatinine Clearance: 39.3 mL/min (by C-G formula based on SCr of 0.88 mg/dL). Liver Function Tests:  Recent Labs Lab 12/08/16 1502  AST 22  ALT 18  ALKPHOS 78  BILITOT 0.4  PROT 6.5  ALBUMIN 3.3*    Recent Labs Lab 12/08/16 1502  LIPASE 31   No results for input(s): AMMONIA in the last 168 hours. Coagulation Profile: No results for input(s): INR, PROTIME in the last 168 hours. Cardiac Enzymes: No results for input(s): CKTOTAL, CKMB, CKMBINDEX, TROPONINI in the last 168 hours. BNP (last 3 results) No results for input(s): PROBNP in the last 8760 hours. HbA1C: No results for input(s): HGBA1C in the last 72 hours. CBG: No results for input(s): GLUCAP in the last 168 hours. Lipid Profile: No results for input(s): CHOL, HDL, LDLCALC, TRIG, CHOLHDL, LDLDIRECT in the last 72 hours. Thyroid Function Tests: No results for input(s): TSH, T4TOTAL, FREET4, T3FREE, THYROIDAB in the last 72 hours. Anemia Panel: No results for input(s): VITAMINB12, FOLATE, FERRITIN, TIBC, IRON, RETICCTPCT in the last 72 hours. Urine analysis:    Component Value Date/Time   COLORURINE YELLOW 12/08/2016 1836   APPEARANCEUR CLEAR 12/08/2016 1836   LABSPEC 1.014 12/08/2016 1836   PHURINE 7.0 12/08/2016 1836   GLUCOSEU NEGATIVE 12/08/2016 1836   HGBUR NEGATIVE 12/08/2016 1836   BILIRUBINUR NEGATIVE 12/08/2016 1836   KETONESUR NEGATIVE 12/08/2016 1836   PROTEINUR NEGATIVE 12/08/2016 1836   UROBILINOGEN 0.2 07/11/2009 2220   NITRITE NEGATIVE 12/08/2016 1836   LEUKOCYTESUR SMALL (A) 12/08/2016 1836    Creatinine Clearance: Estimated Creatinine Clearance: 39.3 mL/min (by C-G formula based on SCr of 0.88 mg/dL).  Sepsis Labs: @LABRCNTIP (procalcitonin:4,lacticidven:4) )No results found for this or any previous visit (from the past 240 hour(s)).   Radiological Exams on Admission: Dg Chest 2 View  Result  Date: 12/08/2016 CLINICAL DATA:  Lower abdominal pain. Continues to not feel well despite antibiotic treatment for diverticulitis. EXAM: CHEST  2 VIEW COMPARISON:  CT 12/08/2016, radiographs 03/13/2016. FINDINGS: Prior sternotomy and CABG. Unchanged mild cardiomegaly. No airspace consolidation. No effusion. Normal pulmonary vasculature. Unremarkable hilar and mediastinal contours, unchanged. IMPRESSION: Stable cardiomegaly.  No consolidation or effusion. Electronically Signed   By: Andreas Newport M.D.   On: 12/08/2016 23:15   Ct Angio Chest Pe W And/or Wo Contrast  Result Date: 12/08/2016 CLINICAL DATA:  Shortness of breath EXAM: CT ANGIOGRAPHY CHEST WITH CONTRAST TECHNIQUE: Multidetector CT imaging of the chest was performed using the standard protocol during bolus administration of intravenous contrast. Multiplanar CT image reconstructions and MIPs were obtained to evaluate the vascular anatomy. CONTRAST:  53 mL Isovue 370 intravenous COMPARISON:  03/13/2016, 12/08/2016, CT chest 01/01/2016 FINDINGS: Cardiovascular: Satisfactory opacification of the pulmonary arteries to the segmental level. No evidence of pulmonary embolism. Aortic atherosclerosis. No aneurysmal dilatation is seen. Coronary artery calcification. Borderline cardiomegaly. No large pericardial effusion. Mediastinum/Nodes: No thyroid mass. Midline trachea. No significantly enlarged mediastinal lymph nodes. Esophagus within normal limits. Lungs/Pleura: Mild emphysema. No acute infiltrate or effusion. No pneumothorax. Upper Abdomen: Nonacute. Musculoskeletal: Degenerative changes of the spine. Post sternotomy changes. Prominent deformity of the medial end of the left clavicle with surrounding soft tissue density. Review of the MIP images confirms the above findings. IMPRESSION: 1. Negative for acute pulmonary embolus or aortic dissection 2. Mild emphysema 3. Prominent deformity of the medial end of left clavicle with prominent soft tissue at the  left sternoclavicular joint, findings could be secondary to healing fracture/trauma ; infection or mass felt less likely. Aortic Atherosclerosis (ICD10-I70.0) and Emphysema (ICD10-J43.9). Electronically Signed   By: Donavan Foil M.D.   On: 12/08/2016 23:07   Ct Abdomen Pelvis W Contrast  Result Date: 12/08/2016 CLINICAL DATA:  Left lower quadrant abdominal pain for 2 weeks. EXAM: CT ABDOMEN AND PELVIS WITH CONTRAST TECHNIQUE: Multidetector CT imaging of the abdomen and pelvis was performed using the standard protocol following bolus administration of intravenous contrast. CONTRAST:  128mL ISOVUE-300 IOPAMIDOL (ISOVUE-300) INJECTION 61% COMPARISON:  CT scan of February 15, 2016. FINDINGS: Lower chest: No acute abnormality. Hepatobiliary: Cholelithiasis is noted. Intrahepatic and extrahepatic biliary dilatation is noted. Otherwise the liver appears unremarkable. Pancreas: Unremarkable. No pancreatic ductal dilatation or surrounding inflammatory changes. Spleen: Normal in size without focal abnormality. Adrenals/Urinary Tract: Adrenal glands are unremarkable. Kidneys are normal, without renal calculi, focal lesion, or hydronephrosis. Bladder is unremarkable. Stomach/Bowel: Stomach is within normal limits. Appendix appears normal. No evidence of bowel wall thickening, distention, or inflammatory changes. Sigmoid diverticulosis is noted without inflammation. Stool is noted throughout the colon. Vascular/Lymphatic: Aortic atherosclerosis. No enlarged abdominal or pelvic lymph nodes. Reproductive: Uterus and bilateral adnexa are unremarkable. Other: No abdominal wall hernia or abnormality. No abdominopelvic ascites. Musculoskeletal: No acute or significant osseous findings. IMPRESSION: Cholelithiasis is noted without inflammation. There is noted mild intrahepatic and extrahepatic biliary dilatation which is increased compared to prior exam. Correlation with liver function tests is recommended to rule out distal common  bile duct obstruction. Aortic atherosclerosis. Sigmoid diverticulosis without inflammation. Stool is noted throughout the colon. Electronically Signed   By: Marijo Conception, M.D.   On: 12/08/2016 18:48    EKG: Independently reviewed.  NSR with rate 79; nonspecific ST changes with no evidence of acute ischemia  Assessment/Plan Principal Problem:   Acute respiratory failure with hypoxia [facility]) Active Problems:   Essential hypertension   Memory loss  PAF (paroxysmal atrial fibrillation) (HCC)   Left groin pain    Acute respiratory failure with hypoxia -Patient without known reported respiratory failure presenting for alternate problem and found to have hypoxia to 83% -Spirometry in 3/18 appeared to show mild restriction more c/w asthma -CT shows emphysema  -Patient does have significant smoking history -She does not appear to have acute infection, as she denies fever, significant cough, etc despite mildly increased WBC count. -For now, will observe -Duonebs standing and prn Albuterol -She was given Solumedrol in the ER but will not give additional steroids at this time -No antibiotics for now  Left groin pain -Patient was evaluated last week in the PCP office and diagnosed with diverticulitis; she reports no h/o fever. -She was fully treated with Cipro/Flagyl but her symptoms persist. -On exam, she repeatedly points to her left groin as the location of the discomfort. -I am more suspicious of a hernia or of hip problems causing her symptoms. -Consider further evaluation by the day team or as an outpatient if this discomfort is ongoing. -UA negative  PAF not on [facility] -Patient with falls leading to multiple prior hospitalizations -This problem appears to have improved, but it is not clear whether she is still too high a fall risk for [facility]  Memory loss -May be related to ETOH but Alzheimer's is also a consideration -It may no longer be safe for patient to be driving -She may benefit  from neuropsych testing -She is taking Aricept and Cymbalta now  HTN -She is on Lopressor monotherapy now   DVT prophylaxis: Lovenox  Code Status:  Full - confirmed with patient/family Family Communication: Daughter present throughout evaluation Disposition Plan:  Home once clinically improved Consults called: None  Admission status: It is my clinical opinion that referral for OBSERVATION is reasonable and necessary in this patient based on the above information provided. The aforementioned taken together are felt to place the patient at high risk for further clinical deterioration. However it is anticipated that the patient may be medically stable for discharge from the hospital within 24 to 48 hours.    Karmen Bongo MD [facility]  If 7PM-7AM, please contact night-coverage [email or web address] Password [email or web address]  12/09/2016, 2:40 AM

## 2016-12-09 DIAGNOSIS — I10 Essential (primary) hypertension: Secondary | ICD-10-CM | POA: Diagnosis not present

## 2016-12-09 DIAGNOSIS — I480 Paroxysmal atrial fibrillation: Secondary | ICD-10-CM

## 2016-12-09 DIAGNOSIS — R413 Other amnesia: Secondary | ICD-10-CM | POA: Diagnosis not present

## 2016-12-09 DIAGNOSIS — R0902 Hypoxemia: Secondary | ICD-10-CM | POA: Diagnosis not present

## 2016-12-09 DIAGNOSIS — J9601 Acute respiratory failure with hypoxia: Secondary | ICD-10-CM | POA: Diagnosis not present

## 2016-12-09 DIAGNOSIS — R1032 Left lower quadrant pain: Secondary | ICD-10-CM | POA: Diagnosis present

## 2016-12-09 LAB — CBC
HCT: 36.4 % (ref 36.0–46.0)
Hemoglobin: 11.3 g/dL — ABNORMAL LOW (ref 12.0–15.0)
MCH: 25.6 pg — ABNORMAL LOW (ref 26.0–34.0)
MCHC: 31 g/dL (ref 30.0–36.0)
MCV: 82.4 fL (ref 78.0–100.0)
Platelets: 260 10*3/uL (ref 150–400)
RBC: 4.42 MIL/uL (ref 3.87–5.11)
RDW: 16.4 % — ABNORMAL HIGH (ref 11.5–15.5)
WBC: 10.9 10*3/uL — ABNORMAL HIGH (ref 4.0–10.5)

## 2016-12-09 LAB — BASIC METABOLIC PANEL
Anion gap: 8 (ref 5–15)
BUN: 11 mg/dL (ref 6–20)
CO2: 22 mmol/L (ref 22–32)
Calcium: 8.3 mg/dL — ABNORMAL LOW (ref 8.9–10.3)
Chloride: 107 mmol/L (ref 101–111)
Creatinine, Ser: 0.99 mg/dL (ref 0.44–1.00)
GFR calc Af Amer: 59 mL/min — ABNORMAL LOW (ref >60)
GFR calc non Af Amer: 51 mL/min — ABNORMAL LOW (ref >60)
Glucose, Bld: 133 mg/dL — ABNORMAL HIGH (ref 65–99)
Potassium: 3.7 mmol/L (ref 3.5–5.1)
Sodium: 137 mmol/L (ref 135–145)

## 2016-12-09 MED ORDER — DONEPEZIL HCL 10 MG PO TABS
10.0000 mg | ORAL_TABLET | Freq: Every day | ORAL | Status: DC
  Administered 2016-12-09: 10 mg via ORAL
  Filled 2016-12-09: qty 1

## 2016-12-09 MED ORDER — ACETAMINOPHEN 325 MG PO TABS: 650.0000 mg | ORAL_TABLET | Freq: Four times a day (QID) | ORAL | Status: DC | PRN

## 2016-12-09 MED ORDER — ACETAMINOPHEN 650 MG RE SUPP: 650.0000 mg | Freq: Four times a day (QID) | RECTAL | Status: DC | PRN

## 2016-12-09 MED ORDER — ONDANSETRON HCL 4 MG/2ML IJ SOLN: 4.0000 mg | Freq: Four times a day (QID) | INTRAMUSCULAR | Status: DC | PRN

## 2016-12-09 MED ORDER — METOPROLOL TARTRATE 50 MG PO TABS
50.0000 mg | ORAL_TABLET | Freq: Two times a day (BID) | ORAL | Status: DC
  Administered 2016-12-09 (×2): 50 mg via ORAL
  Filled 2016-12-09 (×2): qty 1

## 2016-12-09 MED ORDER — IPRATROPIUM-ALBUTEROL 0.5-2.5 (3) MG/3ML IN SOLN
3.0000 mL | Freq: Three times a day (TID) | RESPIRATORY_TRACT | Status: DC
  Administered 2016-12-09: 3 mL via RESPIRATORY_TRACT
  Filled 2016-12-09: qty 3

## 2016-12-09 MED ORDER — PANTOPRAZOLE SODIUM 40 MG PO TBEC
40.0000 mg | DELAYED_RELEASE_TABLET | Freq: Every day | ORAL | Status: DC
  Administered 2016-12-09: 40 mg via ORAL
  Filled 2016-12-09: qty 1

## 2016-12-09 MED ORDER — ENOXAPARIN SODIUM 40 MG/0.4ML ~~LOC~~ SOLN
40.0000 mg | SUBCUTANEOUS | Status: DC
  Administered 2016-12-09: 40 mg via SUBCUTANEOUS
  Filled 2016-12-09 (×2): qty 0.4

## 2016-12-09 MED ORDER — ALBUTEROL SULFATE (2.5 MG/3ML) 0.083% IN NEBU: 2.5000 mg | INHALATION_SOLUTION | RESPIRATORY_TRACT | Status: DC | PRN

## 2016-12-09 MED ORDER — FAMOTIDINE 20 MG PO TABS
20.0000 mg | ORAL_TABLET | Freq: Every day | ORAL | Status: DC
  Administered 2016-12-09: 20 mg via ORAL
  Filled 2016-12-09: qty 1

## 2016-12-09 MED ORDER — FLUTICASONE FUROATE-VILANTEROL 200-25 MCG/INH IN AEPB
1.0000 | INHALATION_SPRAY | Freq: Every day | RESPIRATORY_TRACT | Status: DC
  Filled 2016-12-09: qty 28

## 2016-12-09 MED ORDER — MORPHINE SULFATE (PF) 2 MG/ML IV SOLN: 2.0000 mg | INTRAVENOUS | Status: DC | PRN

## 2016-12-09 MED ORDER — ASPIRIN 81 MG PO CHEW
81.0000 mg | CHEWABLE_TABLET | Freq: Every day | ORAL | Status: DC
  Administered 2016-12-09: 81 mg via ORAL
  Filled 2016-12-09: qty 1

## 2016-12-09 MED ORDER — IPRATROPIUM-ALBUTEROL 0.5-2.5 (3) MG/3ML IN SOLN: 3.0000 mL | Freq: Two times a day (BID) | RESPIRATORY_TRACT | Status: DC

## 2016-12-09 MED ORDER — ONDANSETRON HCL 4 MG PO TABS: 4.0000 mg | ORAL_TABLET | Freq: Four times a day (QID) | ORAL | Status: DC | PRN

## 2016-12-09 MED ORDER — ATORVASTATIN CALCIUM 10 MG PO TABS: 10.0000 mg | ORAL_TABLET | Freq: Every day | ORAL | Status: DC

## 2016-12-09 MED ORDER — TRAMADOL HCL 50 MG PO TABS: 50.0000 mg | ORAL_TABLET | Freq: Every day | ORAL | Status: DC | PRN

## 2016-12-09 MED ORDER — IPRATROPIUM-ALBUTEROL 0.5-2.5 (3) MG/3ML IN SOLN
3.0000 mL | Freq: Four times a day (QID) | RESPIRATORY_TRACT | Status: DC
  Administered 2016-12-09: 3 mL via RESPIRATORY_TRACT
  Filled 2016-12-09 (×2): qty 3

## 2016-12-09 MED ORDER — DOCUSATE SODIUM 100 MG PO CAPS
100.0000 mg | ORAL_CAPSULE | Freq: Two times a day (BID) | ORAL | Status: DC
  Administered 2016-12-09: 100 mg via ORAL
  Filled 2016-12-09 (×2): qty 1

## 2016-12-09 MED ORDER — DULOXETINE HCL 60 MG PO CPEP
60.0000 mg | ORAL_CAPSULE | Freq: Every day | ORAL | Status: DC
  Administered 2016-12-09: 60 mg via ORAL
  Filled 2016-12-09: qty 1

## 2016-12-09 NOTE — Progress Notes (Signed)
SATURATION QUALIFICATIONS: (This note is used to comply with regulatory documentation for home oxygen)  Patient Saturations on Room Air at Rest = 90%  Patient Saturations on Room Air while Ambulating = 87%  Patient Saturations on 2 Liters of oxygen while Ambulating = 92%  Please briefly explain why patient needs home oxygen: Pt's SATS sustain under 88% on room air while ambulating.

## 2016-12-09 NOTE — Care Management Obs Status (Signed)
[place] NOTIFICATION   Patient Details  Name: Tammy [name] MRN: [id number] Date of Birth: September 05, 1930   Medicare Observation Status Notification Given:  Yes    Sharin Mons, RN 12/09/2016, 1:41 PM

## 2016-12-09 NOTE — Care Management Note (Addendum)
Case Management Note  Patient Details  Name: Tammy [name] MRN: [id number] Date of Birth: 11/04/1930  Subjective/Objective:     Presented with respiratory failure, hx of asthma, afib, CAD, GERD, HTN here with Shortness of breath, abdominal pain.  Jodi Mourning (Daughter)     [phone]      PCP: Lona Kettle  Action/Plan: Plan is to d/c to home today.   Expected Discharge Date:  12/09/16               Expected Discharge Plan:  [place], however, pt refused PT recommendation : PT.  In-House Referral:     Discharge planning Services  CM Consult  Post Acute Care Choice:    Choice offered to:  Patient  DME Arranged:   oxygen DME Agency:   [place], pending walking oxygen saturation results,CM made nurse aware. CM made referral with Joelene Millin @ [phone]  [facility] Arranged:   Pt refused home health services. [place] Agency:     Status of Service:  Completed, signed off  If discussed at [place] of Stay Meetings, dates discussed:    Additional Comments:  Sharin Mons, RN 12/09/2016, 1:48 PM

## 2016-12-09 NOTE — Evaluation (Signed)
Physical Therapy Evaluation Patient Details Name: Tammy [name] MRN: [id number] DOB: 1930-06-25 Today's Date: 12/09/2016   History of Present Illness  Tammy [name] is a 83 y.o. female hx of asthma, afib, CAD, GERD, HTN here with Shortness of breath, abdominal pain. Patient had shortness of breath is going on for the last 2-3 weeks.  Patient was diagnosed with diverticulitis about a week ago and is on Cipro, Flagyl. Patient also was diagnosed with cellulitis of the left leg and was put on Keflex several days ago as well. Patient has persistent left lower quadrant abdominal pain for the last several days. Patient was sent in by urgent care for evaluation of abdominal pain.   Clinical Impression  Pt presents with the above diagnosis and below deficits for therapy evaluation. Prior to admission, pt lived alone in a single level home and was completely independent including driving herself to the grocery store and MD appointments. Pt requires Min guard to supervision for all mobility this session for safety. O2 drops to 87% on RA following gait, but improved to 95% at rest. Pt will benefit from continued PT follow-up to address the below deficits prior to DC home.     Follow Up Recommendations Home health PT    Equipment Recommendations  None recommended by PT    Recommendations for Other Services       Precautions / Restrictions Precautions Precautions: Fall Precaution Comments: SOB Restrictions Weight Bearing Restrictions: No      Mobility  Bed Mobility Overal bed mobility: Modified Independent             General bed mobility comments: pt up in recliner upon arrival  Transfers Overall transfer level: Needs assistance   Transfers: Sit to/from Stand Sit to Stand: Modified independent (Device/Increase time)            Ambulation/Gait Ambulation/Gait assistance: Min guard Ambulation Distance (Feet): 250 Feet Assistive device: Rolling walker (2 wheeled) Gait  Pattern/deviations: Step-through pattern Gait velocity: approaching normal Gait velocity interpretation: at or above normal speed for age/gender General Gait Details: good sequencing and cadence, requires cues for environmental awareness.   Stairs            Wheelchair Mobility    Modified Rankin (Stroke Patients Only)       Balance Overall balance assessment: History of Falls                                           Pertinent Vitals/Pain Pain Assessment: No/denies pain    Home Living Family/patient expects to be discharged to:: Private residence Living Arrangements: Alone Available Help at Discharge: Family;Other (Comment);Available PRN/intermittently;Friend(s) (pt daughter works full time, has neighbor who can assist) Type of Home: House Home Access: Stairs to enter Entrance Stairs-Rails: Psychiatric nurse of Steps: 3-4 Home Layout: One level Home Equipment: [place] - 4 wheels;Cane - single point Additional Comments: pt reports she has been doing for herself up until the last 4 weeks    Prior Function Level of Independence: Independent with assistive device(s)      ADL's / Homemaking Assistance Needed: independent with ADLs  Comments: uses [place] when leaving home, uses cart at grocery store. Drives self     Hand Dominance   Dominant Hand: Right    Extremity/Trunk Assessment   Upper Extremity Assessment Upper Extremity Assessment: Defer to OT evaluation    Lower Extremity  Assessment Lower Extremity Assessment: Overall WFL for tasks assessed    Cervical / Trunk Assessment Cervical / Trunk Assessment: Normal  Communication   Communication: HOH  Cognition Arousal/Alertness: Awake/alert Behavior During Therapy: WFL for tasks assessed/performed Overall Cognitive Status: Within Functional Limits for tasks assessed                                        General Comments General comments (skin integrity,  edema, etc.): O2 sats drop to 87% with mobility and quickly increase back to 95% with rest and instruction for pursed lip breathing.     Exercises     Assessment/Plan    PT Assessment Patient needs continued PT services  PT Problem List Decreased activity tolerance;Decreased balance;Decreased mobility;Cardiopulmonary status limiting activity       PT Treatment Interventions DME instruction;Gait training;Stair training;Functional mobility training;Therapeutic activities;Therapeutic exercise;Balance training    PT Goals (Current goals can be found in the Care Plan section)  Acute Rehab PT Goals Patient Stated Goal: go home PT Goal Formulation: With patient Time For Goal Achievement: 12/16/16 Potential to Achieve Goals: Good    Frequency Min 3X/week   Barriers to discharge        Co-evaluation               AM-PAC PT "6 Clicks" Daily Activity  Outcome Measure Difficulty turning over in bed (including adjusting bedclothes, sheets and blankets)?: None Difficulty moving from lying on back to sitting on the side of the bed? : None Difficulty sitting down on and standing up from a chair with arms (e.g., wheelchair, bedside commode, etc,.)?: None Help needed moving to and from a bed to chair (including a wheelchair)?: A Little Help needed walking in hospital room?: A Little Help needed climbing 3-5 steps with a railing? : A Little 6 Click Score: 21    End of Session Equipment Utilized During Treatment: Gait belt Activity Tolerance: Patient tolerated treatment well Patient left: in chair;with call bell/phone within reach Nurse Communication: Mobility status PT Visit Diagnosis: Difficulty in walking, not elsewhere classified (R26.2)    Time: 8329-1916 PT Time Calculation (min) (ACUTE ONLY): 28 min   Charges:   PT Evaluation $PT Eval Moderate Complexity: 1 Procedure PT Treatments $Gait Training: 8-22 mins   PT G Codes:   PT G-Codes **NOT FOR INPATIENT  CLASS** Functional Assessment Tool Used: AM-PAC 6 Clicks Basic Mobility;Clinical judgement Functional Limitation: Mobility: Walking and moving around Mobility: Walking and Moving Around Current Status (O0600): At least 20 percent but less than 40 percent impaired, limited or restricted Mobility: Walking and Moving Around Goal Status [phone]): At least 1 percent but less than 20 percent impaired, limited or restricted    Scheryl Marten PT, DPT  [phone]   Shanon Rosser 12/09/2016, 10:08 AM

## 2016-12-09 NOTE — Plan of Care (Signed)
Problem: Safety: Goal: Ability to remain free from injury will improve Outcome: Progressing Pt will be free from falls and injuries during this hospitalization.  Problem: Physical Regulation: Goal: Ability to maintain clinical measurements within normal limits will improve Outcome: Progressing Pt will be free from signs and symptoms of SOB prior to admission.

## 2016-12-09 NOTE — Discharge Summary (Signed)
Physician Discharge Summary  Tammy [name] PPI:[id number] DOB: 28-Feb-1931 DOA: 12/08/2016  PCP: [name], MD  Admit date: 12/08/2016 Discharge date: 12/09/2016  Admitted From: Home Disposition: Home  Recommendations for Outpatient Follow-up:  1. Follow up with PCP in 1 week 2. Please follow up on the following pending results: None  Home Health: PT Equipment/Devices: None  Discharge Condition: Full code CODE STATUS: Stable Diet recommendation: Heart healthy   Brief/Interim Summary:  Admission HPI written by Karmen Bongo, MD   Chief Complaint: abdominal pain  HPI: Tammy [name] is a 83 y.o. female with medical history significant of coronary artery disease, hypertension, paroxysmal atrial fibrillation not a candidate for anticoagulation due to falls, GERD, dementia, and ETOH dependence in remission presenting for abdominal pain.  Patient saw her doctor today because she was diagnosed with diverticulitis last week but she was still having abdominal pain despite finishing medication (Cipro/Flagyl).  Dr. Sabra Heck saw her today and sent her in to the ER for possible CT.  Currently feeling ok other than LLE discomfort, had also been recently treated for cellulitis.  She has chronic SOB with asthma.  +cough occasionally, minimally poroductive.  No fevers.  SOB is worse with exertion and is chronic.  She hasn't been in pain since she got here.  Her c/o pain is R groin.   ED Course: Abdominal pain was reason for visit but she had a negative CT and meanwhile dropped O2 sats to 83% on RA.  CTA negative for PE, ?COPD.  Placed on 2L O2.    Hospital course:  Acute respiratory failure with hypoxia Mild asthma exacerbation With a history of asthma. Patient initially given Solu-Medrol which was not continued. DuoNeb's were given as needed. No evidence of infection. Oxygen requirement improved slightly but patient qualified for oxygen on discharge. Continue bronchodilators as an  outpatient.  Left groin pain Patient treated for diverticulitis with Cipro and Flagyl as an outpatient. Discomfort improved while in the hospital.  Paroxysmal atrial fibrillation Stable. Rate control. Non-anticoagulation secondary to frequent falls.  Memory loss Continued Aricept and Cymbalta. Patient is eager to drive, although this may not continue to be a safe option for her. Recommend neuropsych testing as an outpatient.  Essential hypertension Continued Lopressor   Discharge Diagnoses:  Principal Problem:   Acute respiratory failure with hypoxia (HCC) Active Problems:   Essential hypertension   Memory loss   PAF (paroxysmal atrial fibrillation) (HCC)   Left groin pain    Discharge Instructions  Discharge Instructions    Call MD for:  difficulty breathing, headache or visual disturbances    Complete by:  As directed    Call MD for:  persistant nausea and vomiting    Complete by:  As directed    Call MD for:  severe uncontrolled pain    Complete by:  As directed    Call MD for:  temperature >100.4    Complete by:  As directed    Diet - low sodium heart healthy    Complete by:  As directed    Increase activity slowly    Complete by:  As directed      Allergies as of 12/09/2016      Reactions   Penicillins Hives   Has patient had a PCN reaction causing immediate rash, facial/tongue/throat swelling, SOB or lightheadedness with hypotension: No Has patient had a PCN reaction causing severe rash involving mucus membranes or skin necrosis: Yes Has patient had a PCN reaction that required hospitalization  No Has patient had a PCN reaction occurring within the last 10 years: Yes took again last year 2016 If all of the above answers are "NO", then may proceed with Cephalosporin use.   Lactose Intolerance (gi) Other (See Comments)   Unknown- Home Health of [facility] documented      Medication List    TAKE these medications   acetaminophen 325 MG  tablet Commonly known as:  TYLENOL Take 650 mg by mouth 2 (two) times daily.   albuterol 108 (90 Base) MCG/ACT inhaler Commonly known as:  PROAIR HFA Inhale 2 puffs every 4-6 hours as needed for cough or wheeze.   aspirin 81 MG tablet Take 81 mg by mouth daily.   atorvastatin 10 MG tablet Commonly known as:  LIPITOR Take 1 tablet (10 mg total) by mouth at bedtime.   donepezil 10 MG tablet Commonly known as:  ARICEPT Take 1 tablet (10 mg total) by mouth daily.   DULoxetine 60 MG capsule Commonly known as:  CYMBALTA Take 60 mg by mouth daily. Reported on 08/05/2015   Fish Oil 1000 MG Caps Take 1 capsule by mouth daily.   fluticasone 50 MCG/ACT nasal spray Commonly known as:  FLONASE USE ONE TO TWO SPRAY(S) IN EACH NOSTRIL ONCE DAILY FOR  STUFFY  NOSE  OR  DRAINAGE What changed:  See the new instructions.   fluticasone furoate-vilanterol 200-25 MCG/INH Aepb Commonly known as:  BREO ELLIPTA Inhale 1 puff into the lungs daily.   folic acid 1 MG tablet Commonly known as:  FOLVITE Take 1 tablet (1 mg total) by mouth daily.   metoprolol tartrate 50 MG tablet Commonly known as:  LOPRESSOR Take 1 tablet (50 mg total) by mouth 2 (two) times daily.   multivitamin with minerals tablet Take 1 tablet by mouth daily.   omeprazole 20 MG capsule Commonly known as:  PRILOSEC Take 20 mg by mouth 2 (two) times daily.   PROBIOTIC DAILY Caps Take 1 capsule by mouth daily.   ranitidine 300 MG tablet Commonly known as:  ZANTAC TAKE ONE TABLET BY MOUTH AT BEDTIME What changed:  See the new instructions.   traMADol 50 MG tablet Commonly known as:  ULTRAM Take 50 mg by mouth daily as needed for pain.            Durable Medical Equipment        Start     Ordered   12/09/16 1216  For home use only DME oxygen  Once    Question Answer Comment  Mode or (Route) Nasal cannula   Liters per Minute 2   Frequency Continuous (stationary and portable oxygen unit needed)   Oxygen  delivery system Gas      12/09/16 1216     Follow-up Information    Elsie Saas, MD Follow up.   Specialty:  Orthopedic Surgery Contact information: [place] [address] [phone]        [name], MD. Schedule an appointment as soon as possible for a visit in 1 week(s).   Specialty:  Family Medicine Contact information: [place] [address] [phone]          Allergies  Allergen Reactions  . Penicillins Hives    Has patient had a PCN reaction causing immediate rash, facial/tongue/throat swelling, SOB or lightheadedness with hypotension: No Has patient had a PCN reaction causing severe rash involving mucus membranes or skin necrosis: Yes Has patient had a PCN  reaction that required hospitalization No Has patient had a PCN reaction occurring within the last 10 years: Yes took again last year 2016 If all of the above answers are "NO", then may proceed with Cephalosporin use.   . Lactose Intolerance (Gi) Other (See Comments)    Unknown- [facility] of [facility] documented    Consultations:  None   Procedures/Studies: Dg Chest 2 View  Result Date: 12/08/2016 CLINICAL DATA:  Lower abdominal pain. Continues to not feel well despite antibiotic treatment for diverticulitis. EXAM: CHEST  2 VIEW COMPARISON:  CT 12/08/2016, radiographs 03/13/2016. FINDINGS: Prior sternotomy and CABG. Unchanged mild cardiomegaly. No airspace consolidation. No effusion. Normal pulmonary vasculature. Unremarkable hilar and mediastinal contours, unchanged. IMPRESSION: Stable cardiomegaly.  No consolidation or effusion. Electronically Signed   By: Andreas Newport M.D.   On: 12/08/2016 23:15   Ct Angio Chest Pe W And/or Wo Contrast  Result Date: 12/08/2016 CLINICAL DATA:  Shortness of breath EXAM: CT ANGIOGRAPHY CHEST WITH CONTRAST TECHNIQUE: Multidetector CT imaging of the chest was performed using the standard  protocol during bolus administration of intravenous contrast. Multiplanar CT image reconstructions and MIPs were obtained to evaluate the vascular anatomy. CONTRAST:  53 mL Isovue 370 intravenous COMPARISON:  03/13/2016, 12/08/2016, CT chest 01/01/2016 FINDINGS: Cardiovascular: Satisfactory opacification of the pulmonary arteries to the segmental level. No evidence of pulmonary embolism. Aortic atherosclerosis. No aneurysmal dilatation is seen. Coronary artery calcification. Borderline cardiomegaly. No large pericardial effusion. Mediastinum/Nodes: No thyroid mass. Midline trachea. No significantly enlarged mediastinal lymph nodes. Esophagus within normal limits. Lungs/Pleura: Mild emphysema. No acute infiltrate or effusion. No pneumothorax. Upper Abdomen: Nonacute. Musculoskeletal: Degenerative changes of the spine. Post sternotomy changes. Prominent deformity of the medial end of the left clavicle with surrounding soft tissue density. Review of the MIP images confirms the above findings. IMPRESSION: 1. Negative for acute pulmonary embolus or aortic dissection 2. Mild emphysema 3. Prominent deformity of the medial end of left clavicle with prominent soft tissue at the left sternoclavicular joint, findings could be secondary to healing fracture/trauma ; infection or mass felt less likely. Aortic Atherosclerosis (ICD10-I70.0) and Emphysema (ICD10-J43.9). Electronically Signed   By: Donavan Foil M.D.   On: 12/08/2016 23:07   Ct Abdomen Pelvis W Contrast  Result Date: 12/08/2016 CLINICAL DATA:  Left lower quadrant abdominal pain for 2 weeks. EXAM: CT ABDOMEN AND PELVIS WITH CONTRAST TECHNIQUE: Multidetector CT imaging of the abdomen and pelvis was performed using the standard protocol following bolus administration of intravenous contrast. CONTRAST:  191mL ISOVUE-300 IOPAMIDOL (ISOVUE-300) INJECTION 61% COMPARISON:  CT scan of February 15, 2016. FINDINGS: Lower chest: No acute abnormality. Hepatobiliary:  Cholelithiasis is noted. Intrahepatic and extrahepatic biliary dilatation is noted. Otherwise the liver appears unremarkable. Pancreas: Unremarkable. No pancreatic ductal dilatation or surrounding inflammatory changes. Spleen: Normal in size without focal abnormality. Adrenals/Urinary Tract: Adrenal glands are unremarkable. Kidneys are normal, without renal calculi, focal lesion, or hydronephrosis. Bladder is unremarkable. Stomach/Bowel: Stomach is within normal limits. Appendix appears normal. No evidence of bowel wall thickening, distention, or inflammatory changes. Sigmoid diverticulosis is noted without inflammation. Stool is noted throughout the colon. Vascular/Lymphatic: Aortic atherosclerosis. No enlarged abdominal or pelvic lymph nodes. Reproductive: Uterus and bilateral adnexa are unremarkable. Other: No abdominal wall hernia or abnormality. No abdominopelvic ascites. Musculoskeletal: No acute or significant osseous findings. IMPRESSION: Cholelithiasis is noted without inflammation. There is noted mild intrahepatic and extrahepatic biliary dilatation which is increased compared to prior exam. Correlation with liver function tests is recommended to rule out  distal common bile duct obstruction. Aortic atherosclerosis. Sigmoid diverticulosis without inflammation. Stool is noted throughout the colon. Electronically Signed   By: Marijo Conception, M.D.   On: 12/08/2016 18:48      Subjective: No dyspnea or chest pain. No abdominal pain.  Discharge Exam: Vitals:   12/09/16 0139 12/09/16 0555  BP: (!) 173/58 (!) 148/47  Pulse: 90 74  Resp: 20 16  Temp: 98.4 F (36.9 C) 98.2 F (36.8 C)   Vitals:   12/09/16 0030 12/09/16 0139 12/09/16 0230 12/09/16 0555  BP: 128/64 (!) 173/58  (!) 148/47  Pulse: 71 90  74  Resp: (!) 23 20  16   Temp:  98.4 F (36.9 C)  98.2 F (36.8 C)  TempSrc:  Oral  Oral  SpO2: 100% 100% 100% 96%  Weight:  66 kg (145 lb 6.4 oz)    Height:  5' (1.524 m)      General:  Pt is alert, awake, not in acute distress Cardiovascular: RRR, S1/S2 +, no rubs, no gallops Respiratory: CTA bilaterally, no wheezing, no rhonchi Abdominal: Soft, NT, ND, bowel sounds + Extremities: no edema, no cyanosis    The results of significant diagnostics from this hospitalization (including imaging, microbiology, ancillary and laboratory) are listed below for reference.     Microbiology: No results found for this or any previous visit (from the past 240 hour(s)).   Labs: BNP (last 3 results)  Recent Labs  02/12/16 0801 02/14/16 1727  BNP 651.0* 732.2*   Basic Metabolic Panel:  Recent Labs Lab 12/08/16 1502 12/09/16 0256  NA 136 137  K 4.4 3.7  CL 103 107  CO2 23 22  GLUCOSE 96 133*  BUN 14 11  CREATININE 0.88 0.99  CALCIUM 8.7* 8.3*   Liver Function Tests:  Recent Labs Lab 12/08/16 1502  AST 22  ALT 18  ALKPHOS 78  BILITOT 0.4  PROT 6.5  ALBUMIN 3.3*    Recent Labs Lab 12/08/16 1502  LIPASE 31   No results for input(s): AMMONIA in the last 168 hours. CBC:  Recent Labs Lab 12/08/16 1502 12/09/16 0256  WBC 13.5* 10.9*  HGB 12.3 11.3*  HCT 39.1 36.4  MCV 82.5 82.4  PLT 317 260   Urinalysis    Component Value Date/Time   COLORURINE YELLOW 12/08/2016 1836   APPEARANCEUR CLEAR 12/08/2016 1836   LABSPEC 1.014 12/08/2016 1836   PHURINE 7.0 12/08/2016 1836   GLUCOSEU NEGATIVE 12/08/2016 1836   HGBUR NEGATIVE 12/08/2016 1836   BILIRUBINUR NEGATIVE 12/08/2016 1836   KETONESUR NEGATIVE 12/08/2016 1836   PROTEINUR NEGATIVE 12/08/2016 1836   UROBILINOGEN 0.2 07/11/2009 2220   NITRITE NEGATIVE 12/08/2016 1836   LEUKOCYTESUR SMALL (A) 12/08/2016 1836     SIGNED:   Cordelia Poche, MD [facility] 12/09/2016, 12:21 PM Pager [phone]  If 7PM-7AM, please contact night-coverage [email or web address] Password [email or web address]

## 2016-12-09 NOTE — Evaluation (Signed)
Occupational Therapy Evaluation Patient Details Name: Tammy [name] MRN: [id number] DOB: [birth year] Today's Date: 12/09/2016    History of Present Illness Tammy [name] is a 83 y.o. female hx of asthma, afib, CAD, GERD, HTN here with Shortness of breath, abdominal pain. Patient had shortness of breath is going on for the last 2-3 weeks.  Patient was diagnosed with diverticulitis about a week ago and is on Cipro, Flagyl. Patient also was diagnosed with cellulitis of the left leg and was put on Keflex several days ago as well. Patient has persistent left lower quadrant abdominal pain for the last several days. Patient was sent in by urgent care for evaluation of abdominal pain.    Clinical Impression   Pt is at Mod I level with ADLs and ADL mobility using RW. O2 SATs 89-92% throughout session. All education completed and no further acute OT indicated at this time    Follow Up Recommendations  No OT follow up;Supervision - Intermittent    Equipment Recommendations  Tub/shower seat    Recommendations for Other Services       Precautions / Restrictions Precautions Precautions: Fall Precaution Comments: SOB Restrictions Weight Bearing Restrictions: No      Mobility Bed Mobility Overal bed mobility: Modified Independent             General bed mobility comments: pt up in recliner upon arrival  Transfers Overall transfer level: Needs assistance   Transfers: Sit to/from Stand Sit to Stand: Modified independent (Device/Increase time)              Balance Overall balance assessment: No apparent balance deficits (not formally assessed)                                         ADL either performed or assessed with clinical judgement   ADL Overall ADL's : Modified independent;At baseline                                       General ADL Comments: no SOB, O2 SATs> 89 - 92% during ADL and ADL mobility tasks in room. Pt able to verbalize  and demo energy conservation techniques during functional tasks     Vision Baseline Vision/History: Wears glasses Wears Glasses: Reading only Patient Visual Report: No change from baseline                  Pertinent Vitals/Pain Pain Assessment: No/denies pain     Hand Dominance Right   Extremity/Trunk Assessment Upper Extremity Assessment Upper Extremity Assessment: Generalized weakness   Lower Extremity Assessment Lower Extremity Assessment: Defer to PT evaluation   Cervical / Trunk Assessment Cervical / Trunk Assessment: Normal   Communication Communication Communication: HOH   Cognition Arousal/Alertness: Awake/alert Behavior During Therapy: WFL for tasks assessed/performed Overall Cognitive Status: Within Functional Limits for tasks assessed                                     General Comments   pt very pleasant and cooperative, talkative               Home Living Family/patient expects to be discharged to:: Private residence Living Arrangements: Alone Available Help at Discharge: Family;Other (Comment);Available PRN/intermittently;Friend(s) (  daugther works 60 hrs a day, has neighbor who can assist) Type of Home: House Home Access: Stairs to enter [employer] of Steps: 3-4 Entrance Stairs-Rails: Right;Left Home Layout: One level     Bathroom Shower/Tub: Occupational psychologist: [place]: Environmental consultant - 4 wheels;Cane - single point   Additional Comments: pt reports she has been doing for herself up until the last 4 weeks      Prior Functioning/Environment Level of Independence: Independent with assistive device(s)    ADL's / Homemaking Assistance Needed: independent with ADLs   Comments: uses [name] when leaving home, uses cart at grocery store. Drives self        OT Problem List: Decreased activity tolerance;Decreased knowledge of use of DME or AE      OT Treatment/Interventions:      OT  Goals(Current goals can be found in the care plan section) Acute Rehab OT Goals Patient Stated Goal: go home OT Goal Formulation: With patient  OT Frequency:  Eval only   Barriers to D/C:    no barriers                     AM-PAC PT "6 Clicks" Daily Activity     Outcome Measure Help from another person eating meals?: None Help from another person taking care of personal grooming?: None Help from another person toileting, which includes using toliet, bedpan, or urinal?: None Help from another person bathing (including washing, rinsing, drying)?: None Help from another person to put on and taking off regular upper body clothing?: None Help from another person to put on and taking off regular lower body clothing?: None 6 Click Score: 24   End of Session Equipment Utilized During Treatment: Rolling walker  Activity Tolerance: Patient tolerated treatment well Patient left: in chair;with call bell/phone within reach  OT Visit Diagnosis: Muscle weakness (generalized) (M62.81)                Time: 0786-7544 OT Time Calculation (min): 17 min Charges:  OT General Charges $OT Visit: 1 Procedure OT Evaluation $OT Eval Moderate Complexity: 1 Procedure G-Codes: OT G-codes **NOT FOR INPATIENT CLASS** Functional Assessment Tool Used: AM-PAC 6 Clicks Daily Activity Functional Limitation: Self care Self Care Current Status (B2010): At least 1 percent but less than 20 percent impaired, limited or restricted Self Care Goal Status (O7121): At least 1 percent but less than 20 percent impaired, limited or restricted Self Care Discharge Status [phone]): At least 1 percent but less than 20 percent impaired, limited or restricted     Tammy [name] 12/09/2016, 9:59 AM

## 2016-12-09 NOTE — Consult Note (Signed)
[facility] CM Primary Care Navigator  12/09/2016  Tammy [name] 25-Jun-1930 [id number]    Met with patient at the bedside to identify possible discharge needs. Patient reports having worsening left abdominal pain that led to this admission.  Patient endorses Dr. Lona Kettle with [facility] as the primary care provider.   Patient shared using [place] [facility] to obtain medications without difficulty so far.   Patient mentioned that daughter Tammy [name].) helps manage her own medications at home using "pill box" system filled weekly.   Patient reports driving prior to admission, however, her friend Tammy Poling C.) can provide transportation to her doctors' appointments after discharge if needed.  Patient states living alone and fairly independent. She hopes to do the same when discharged.  According to patient, her daughter provides assistance with care if needed. Patient also states she can ask her friend to assist her when needed, while daughter is at work.  Anticipated discharge plan is home per patient. Per Inpatient CM note, home health services had been recommended by PT but patient had refused it.  Patient expressed understanding to call primary care provider's office for a post discharge follow-up appointment within a week or sooner if needs arise. Patient letter (with PCP's contact number) was provided as a reminder.  Explained to patient about [facility] CM services available for health management but she denies any health management needs or concerns at this time. However, she verbally agreed with EMMI General Calls to follow-up with her recovery at home.  Referral was made for [facility] calls.  [facility] care management information provided for future needs that she may have.  Primary care provider's office called Tammy [name]) to notify of patient's discharge and need for post hospital follow-up and transition of care. Made  aware of patient's health issues needing follow-up as well. Reminded to refer patient to [facility] CM services if deemed appropriate for it.   For questions, please contact:  Tammy [name], BSN, RN- [facility] Primary Care Navigator  Telephone: [phone] [place]

## 2016-12-09 NOTE — ED Notes (Signed)
Y[place], MD at bedside 

## 2016-12-09 NOTE — Discharge Instructions (Addendum)
Tammy [name],  You were admitted because of low oxygen. This may be secondary to your asthma. While you're walking, your oxygen dropped. You'll go home with oxygen and will need to follow-up with her primary care physician. While you were here, you discussed that you have some issues with regurgitating food. Please follow-up with your gastroenterologist, Dr. Amedeo Plenty, for continued management. In the interim, please continue your reflux medication.   Acute Respiratory Failure, Adult Acute respiratory failure occurs when there is not enough oxygen passing from your lungs to your body. When this happens, your lungs have trouble removing carbon dioxide from the blood. This causes your blood oxygen level to drop too low as carbon dioxide builds up. Acute respiratory failure is a medical emergency. It can develop quickly, but it is temporary if treated promptly. Your lung capacity, or how much air your lungs can hold, may improve with time, exercise, and treatment. What are the causes? There are many possible causes of acute respiratory failure, including:  Lung injury.  Chest injury or damage to the ribs or tissues near the lungs.  Lung conditions that affect the flow of air and blood into and out of the lungs, such as pneumonia, acute respiratory distress syndrome, and cystic fibrosis.  Medical conditions, such as strokes or spinal cord injuries, that affect the muscles and nerves that control breathing.  Blood infection (sepsis).  Inflammation of the pancreas (pancreatitis).  A blood clot in the lungs (pulmonary embolism).  A large-volume blood transfusion.  Burns.  Near-drowning.  Seizure.  Smoke inhalation.  Reaction to medicines.  Alcohol or drug overdose.  What increases the risk? This condition is more likely to develop in people who have:  A blocked airway.  Asthma.  A condition or disease that damages or weakens the muscles, nerves, bones, or tissues that are involved  in breathing.  A serious infection.  A health problem that blocks the unconscious reflex that is involved in breathing, such as hypothyroidism or sleep apnea.  A lung injury or trauma.  What are the signs or symptoms? Trouble breathing is the main symptom of acute respiratory failure. Symptoms may also include:  Rapid breathing.  Restlessness or anxiety.  Skin, lips, or fingernails that appear blue (cyanosis).  Rapid heart rate.  Abnormal heart rhythms (arrhythmias).  Confusion or changes in behavior.  Tiredness or loss of energy.  Feeling sleepy or having a loss of consciousness.  How is this diagnosed? Your health care provider can diagnose acute respiratory failure with a medical history and physical exam. During the exam, your health care provider will listen to your heart and check for crackling or wheezing sounds in your lungs. Your may also have tests to confirm the diagnosis and determine what is causing respiratory failure. These tests may include:  Measuring the amount of oxygen in your blood (pulse oximetry). The measurement comes from a small device that is placed on your finger, earlobe, or toe.  Other blood tests to measure blood gases and to look for signs of infection.  Sampling your cerebral spinal fluid or tracheal fluid to check for infections.  Chest X-ray to look for fluid in spaces that should be filled with air.  Electrocardiogram (ECG) to look at the heart's electrical activity.  How is this treated? Treatment for this condition usually takes places in a hospital intensive care unit (ICU). Treatment depends on what is causing the condition. It may include one or more treatments until your symptoms improve. Treatment may include:  Supplemental oxygen. Extra oxygen is given through a tube in the nose, a face mask, or a hood.  A device such as a continuous positive airway pressure (CPAP) or bi-level positive airway pressure (BiPAP or BPAP) machine.  This treatment uses mild air pressure to keep the airways open. A mask or other device will be placed over your nose or mouth. A tube that is connected to a motor will deliver oxygen through the mask.  Ventilator. This treatment helps move air into and out of the lungs. This may be done with a bag and mask or a machine. For this treatment, a tube is placed in your windpipe (trachea) so air and oxygen can flow to the lungs.  Extracorporeal membrane oxygenation (ECMO). This treatment temporarily takes over the function of the heart and lungs, supplying oxygen and removing carbon dioxide. ECMO gives the lungs a chance to recover. It may be used if a ventilator is not effective.  Tracheostomy. This is a procedure that creates a hole in the neck to insert a breathing tube.  Receiving fluids and medicines.  Rocking the bed to help breathing.  Follow these instructions at home:  Take over-the-counter and prescription medicines only as told by your health care provider.  Return to normal activities as told by your health care provider. Ask your health care provider what activities are safe for you.  Keep all follow-up visits as told by your health care provider. This is important. How is this prevented? Treating infections and medical conditions that may lead to acute respiratory failure can help prevent the condition from developing. Contact a health care provider if:  You have a fever.  Your symptoms do not improve or they get worse. Get help right away if:  You are having trouble breathing.  You lose consciousness.  Your have cyanosis or turn blue.  You develop a rapid heart rate.  You are confused. These symptoms may represent a serious problem that is an emergency. Do not wait to see if the symptoms will go away. Get medical help right away. Call your local emergency services (911 in the [place] Do not drive yourself to the hospital. This information is not intended to replace  advice given to you by your health care provider. Make sure you discuss any questions you have with your health care provider. Document Released: 05/08/2013 Document Revised: 11/29/2015 Document Reviewed: 11/19/2015 Elsevier Interactive Patient Education  [employer].

## 2016-12-11 DIAGNOSIS — R001 Bradycardia, unspecified: Secondary | ICD-10-CM | POA: Diagnosis not present

## 2016-12-11 DIAGNOSIS — J9601 Acute respiratory failure with hypoxia: Secondary | ICD-10-CM | POA: Diagnosis not present

## 2016-12-13 ENCOUNTER — Other Ambulatory Visit: Payer: Self-pay | Admitting: *Deleted

## 2016-12-13 NOTE — Patient Outreach (Signed)
[place] [facility]) Care Management  12/13/2016  Tammy [name] [birth year] [id number]  EMMI-General Discharge Red Alert Day #1 12/11/2016 Reason-Wound healing well-no Recent hospital stay from 7/25-7/26/2018 Diagnosis: acute respiratory failure with hypoxia Active dxs: HTN, memory loss. PAF, left groin pain  Telephone call to patient who was advised of reason for call & [facility] care management services. HIPPA verification received. Voices she answered " no "to wounds healing because area is still not well. States she was treated for infection of lower leg recently. States that is not a problem but she has had pain in left groin area off & on since hospital stay. States she was recently admitted due to trouble breathing & low oxygen level. States she has asthma. States she currently has home oxygen that she uses as needed.   Patient voices that she lives alone and cares for self. States daughter brings her meals but is working during the day.  States she fixes her own breakfast & does her own shopping. States she "had meals on Wheels in the past but does not want now because she would need to get rid of her care".  States she continues to drive & picks up her medications from local [facility].  States she manages own medications & drives herself to MD appointments.   States she uses walker as needed. States does have problem with balance & has fallen 1 or 2 times within last several months. States she had to call 911 the last time she fell because she could not get up. States she has arthritis in her spine.   Per medical progress notes patient refused home health services when discharged from hospital>  Recommendations for [facility] services of Community care coordinator for transition of care & complex case management.. Patient is falls risk; has memory deficit; lives alone, continues to drive, managing own medications.   Plan; Send to care management assistant to assign [facility] care  Coordinator for complex case management.Sherrin Daisy, RN BSN [place] Management Coordinator [facility] Care Management  [phone]

## 2016-12-15 ENCOUNTER — Other Ambulatory Visit: Payer: Self-pay | Admitting: *Deleted

## 2016-12-15 DIAGNOSIS — R1032 Left lower quadrant pain: Secondary | ICD-10-CM | POA: Diagnosis not present

## 2016-12-15 DIAGNOSIS — L0390 Cellulitis, unspecified: Secondary | ICD-10-CM | POA: Diagnosis not present

## 2016-12-15 DIAGNOSIS — M810 Age-related osteoporosis without current pathological fracture: Secondary | ICD-10-CM | POA: Diagnosis not present

## 2016-12-15 DIAGNOSIS — I10 Essential (primary) hypertension: Secondary | ICD-10-CM | POA: Diagnosis not present

## 2016-12-15 DIAGNOSIS — I700 Atherosclerosis of aorta: Secondary | ICD-10-CM | POA: Diagnosis not present

## 2016-12-15 DIAGNOSIS — J4540 Moderate persistent asthma, uncomplicated: Secondary | ICD-10-CM | POA: Diagnosis not present

## 2016-12-15 DIAGNOSIS — J439 Emphysema, unspecified: Secondary | ICD-10-CM | POA: Diagnosis not present

## 2016-12-15 DIAGNOSIS — E782 Mixed hyperlipidemia: Secondary | ICD-10-CM | POA: Diagnosis not present

## 2016-12-15 DIAGNOSIS — K219 Gastro-esophageal reflux disease without esophagitis: Secondary | ICD-10-CM | POA: Diagnosis not present

## 2016-12-15 DIAGNOSIS — B379 Candidiasis, unspecified: Secondary | ICD-10-CM | POA: Diagnosis not present

## 2016-12-15 DIAGNOSIS — D649 Anemia, unspecified: Secondary | ICD-10-CM | POA: Diagnosis not present

## 2016-12-15 NOTE — Patient Outreach (Signed)
[place] [facility]) Care Management  12/15/2016  SHENITA TREGO [birth year] [id number]   REFERRAL RECEIVED 7/31-EMMI RED FLAG 7/31  RN attempted outreach call today however unsuccessful but able to leave a HIPAA approved voice message requesting a call back. Will continue to schedule ongoing follow up calls accordingly for pending [facility] services.  [name], RN Care Management Coordinator [place] Office [phone]

## 2016-12-16 DIAGNOSIS — M25552 Pain in left hip: Secondary | ICD-10-CM | POA: Diagnosis not present

## 2016-12-17 ENCOUNTER — Other Ambulatory Visit: Payer: Self-pay | Admitting: *Deleted

## 2016-12-17 NOTE — Patient Outreach (Signed)
[place] [facility]) Care Management  12/16/2016  BOBBYJO MARULANDA May 14, 1931 [id number]  EMMI RED 8/1 and Transition of care  RN attempted outreach call that was unsuccessful and unable to leave a message. Will continue outreach calls accordingly.   [name], RN Care Management Coordinator [place] Office [phone]

## 2016-12-20 DIAGNOSIS — R1032 Left lower quadrant pain: Secondary | ICD-10-CM | POA: Diagnosis not present

## 2016-12-20 DIAGNOSIS — K219 Gastro-esophageal reflux disease without esophagitis: Secondary | ICD-10-CM | POA: Diagnosis not present

## 2016-12-23 ENCOUNTER — Other Ambulatory Visit: Payer: Self-pay | Admitting: *Deleted

## 2016-12-23 ENCOUNTER — Encounter: Payer: Self-pay | Admitting: *Deleted

## 2016-12-23 DIAGNOSIS — I10 Essential (primary) hypertension: Secondary | ICD-10-CM

## 2016-12-23 NOTE — Patient Outreach (Signed)
[place] [facility]) Care Management  12/23/2016  SARH KIRSCHENBAUM [birth year] [id number]   3rd transition of care unsuccessful outreach call and RN unable to leave a voice message. Will send outreach letter accordingly and allow call back time for pt to respond.   [name], RN Care Management Coordinator [place] Office [phone]

## 2016-12-23 NOTE — Patient Outreach (Signed)
[facility]) Care Management  12/23/2016  MANASI DISHON 09-27-1930 [id number]   Transition of care  RN spoke with pt and verified identifiers today. Completed the initial assessment and spoke in detail concerning her medical conditions. Pt reports her asthma and hypertension issues are controlled however prefers to work on her falls issues with safety in the home. Pt states her home is safe but receptive to the offer for a home visit for a more one-on-one consultation with this RN case management. RN scheduled a home visit suitable for pt due to her appointments and will follow up accordingly. Pt very receptive to the upcoming home visit and reports another issues with her medication for her asthma is too expensive. RN Offered consult with [place] as pt remains receptive and agreed to the referral.  No other issues to address at this time as RN will make a pharmacy referral accordingly.  Patient was recently discharged from hospital and all medications have been reviewed.  [name], RN Care Management Coordinator [place] Office [phone]

## 2016-12-28 ENCOUNTER — Encounter: Payer: Self-pay | Admitting: Pharmacist

## 2016-12-28 ENCOUNTER — Telehealth: Payer: Self-pay | Admitting: Pharmacist

## 2016-12-28 NOTE — Patient Outreach (Signed)
[facility]) Care Management  [place]   12/28/2016  Tammy [name] 08-07-1930 [id number]  Subjective: Patient was called today regarding medication assistance per referral from [facility] Nurse, [name].  HIPAA identifiers were obtained. Patient is a 83 y.o.femalewith medical history significant of coronary artery disease, hypertension, paroxysmal atrial fibrillation, GERD, frequent falls, dementia and generalized weakness.  Patient reported her daughter fills her pill box for her once per week.   Objective:   Encounter Medications: Outpatient Encounter Prescriptions as of 12/28/2016  Medication Sig Note  . acetaminophen (TYLENOL) 325 MG tablet Take 650 mg by mouth 2 (two) times daily.   [name] aspirin 81 MG tablet Take 81 mg by mouth daily.    [name] atorvastatin (LIPITOR) 10 MG tablet Take 1 tablet (10 mg total) by mouth at bedtime.   . donepezil (ARICEPT) 10 MG tablet Take 1 tablet (10 mg total) by mouth daily.   . DULoxetine (CYMBALTA) 60 MG capsule Take 60 mg by mouth daily. Reported on 08/05/2015 12/13/2016: Patient reports she is taking twice daily/am & PM  . fluticasone (FLONASE) 50 MCG/ACT nasal spray USE ONE TO TWO SPRAY(S) IN EACH NOSTRIL ONCE DAILY FOR  STUFFY  NOSE  OR  DRAINAGE (Patient taking differently: USE ONE TO TWO SPRAY(S) IN EACH NOSTRIL DAILY AS NEEDED FOR  STUFFY  NOSE  OR  DRAINAGE)   . fluticasone furoate-vilanterol (BREO ELLIPTA) 200-25 MCG/INH AEPB Inhale 1 puff into the lungs daily.   . folic acid (FOLVITE) 1 MG tablet Take 1 tablet (1 mg total) by mouth daily.   . Multiple Vitamins-Minerals (MULTIVITAMIN WITH MINERALS) tablet Take 1 tablet by mouth daily.   . Omega-3 Fatty Acids (FISH OIL) 1000 MG CAPS Take 1 capsule by mouth daily.   [name] omeprazole (PRILOSEC) 20 MG capsule Take 20 mg by mouth 2 (two) times daily.    . Probiotic Product (PROBIOTIC DAILY) CAPS Take 1 capsule by mouth daily.   . ranitidine (ZANTAC) 300 MG tablet TAKE ONE  TABLET BY MOUTH AT BEDTIME (Patient taking differently: TAKE 300MG  BY MOUTH AT BEDTIME)   . albuterol (PROAIR HFA) 108 (90 Base) MCG/ACT inhaler Inhale 2 puffs every 4-6 hours as needed for cough or wheeze.   . metoprolol tartrate (LOPRESSOR) 50 MG tablet Take 1 tablet (50 mg total) by mouth 2 (two) times daily.   . [DISCONTINUED] traMADol (ULTRAM) 50 MG tablet Take 50 mg by mouth daily as needed for pain.    No facility-administered encounter medications on file as of 12/28/2016.     Functional Status: In your present state of health, do you have any difficulty performing the following activities: 12/23/2016 12/14/2016  Hearing? Tempie Donning  Vision? N N  Difficulty concentrating or making decisions? Tempie Donning  Walking or climbing stairs? Y Y  Dressing or bathing? Y Y  Doing errands, shopping? (No Data) Y  Comment Pt states she continue to drives -  Preparing Food and eating ? N -  Using the Toilet? N -  In the past six months, have you accidently leaked urine? Y -  Comment wears incontinent pads -  Do you have problems with loss of bowel control? Y -  Managing your Medications? Y -  Comment daughter assist with medication pillbox -  Managing your Finances? N -  Housekeeping or managing your Housekeeping? Y -  Some recent data might be hidden    Fall/Depression Screening: Fall Risk  12/23/2016 12/13/2016  Falls in the past year?  Yes Yes  Number falls in past yr: 1 1  Injury with Fall? No No  Risk for fall due to : Impaired balance/gait Impaired balance/gait;History of fall(s)  Follow up Falls prevention discussed -   PHQ 2/9 Scores 12/23/2016 12/13/2016  PHQ - 2 Score 0 1      Assessment: Patient was recently discharged from hospital and all medications have been reviewed.   Drugs sorted by system:  Neurologic/Psychologic: Duloxetine Donepezil  Cardiovascular: Aspirin Atorvastatin Metoprolol Tartrate   Pulmonary/Allergy: ProAir Breo Fluticasone  Nasal   Gastrointestinal: Omeprazole Ranitidine Probiotic  Pain: Acetaminophen  Vitamins/Minerals: Multiple Vitamin Folic Acid   Medication Review Findings:  Tramadol-was on the discharge summary but patient said she is no longer taking. Was filled for 20 tablets with a 5 day supply post discharge (removed from medication list)  Duloxetine-Patient reported to [place] she is taking 1 capsule twice daily.  Instructions from discharge and pharmacy fill histories support Duloxetine 60mg  1 capsule daily.  While speaking to the patient over the phone today, she answered yes to taking the duloxetine once daily and twice daily.  Patient has a hearing aid so it is very difficult to speak over the phone. Patient's provider will be sent a note to follow up on the duloxetine dose at the next visit. (Due to the patient's hearing issues and with review of the refill history, I do not feel the issue is urgent)   Medication Assistance Findings:  -Patient may qualify for Extra Help offered through Social Security -An Extra Help Application was completed on the phone with the patient -Patient reported affordability issues with her inhalers (Breo and ProAir).  Memory Dance is made by [employer].  ProAir is made by [employer]. -[employer] does not offer a program for patient's with Medicare Part D -[employer] was called to gather the patient's TROOP. According to HTA, patient has spent more than $600 on medication expenses (as is required by Demetria Pore) -[employer] also has Ventolin on their program which could be substituted for ProAir if deemed therapeutically appropriate.   Plan:  Letter will be routed to [place], Doreene Burke, CPhT  Follow up with the patient/[place] in 5-7 business days.  Route note to PCP  Elayne Guerin, PharmD, [place] Clinical Pharmacist [phone]

## 2017-01-03 ENCOUNTER — Other Ambulatory Visit: Payer: Self-pay | Admitting: Pharmacy Technician

## 2017-01-03 DIAGNOSIS — M25552 Pain in left hip: Secondary | ICD-10-CM | POA: Diagnosis not present

## 2017-01-03 DIAGNOSIS — L03116 Cellulitis of left lower limb: Secondary | ICD-10-CM | POA: Diagnosis not present

## 2017-01-03 DIAGNOSIS — L03115 Cellulitis of right lower limb: Secondary | ICD-10-CM | POA: Diagnosis not present

## 2017-01-03 NOTE — Patient Outreach (Signed)
  [facility] Care Management  01/03/2017  Tammy [name] Feb 08, 1931 [id number]   I attempted to contact patient in reference to patient assistance application for Breo and Ventolin through [employer]. I left a message to inform the patient that the application is being mailed to her today and to let her know if she needed any assistance that she could contact me directly.  Doreene Burke, [place] [phone]

## 2017-01-03 NOTE — Patient Outreach (Signed)
[facility]) Care Management  01/03/2017  Tammy [name] 05/26/1930 [id number]   I faxed Dr. Bruna Potter office for hardcopies for Breo and Ventolin to have once patient's portion is returned. This will expedite the process of getting the medication's approved.  Doreene Burke, [place] [phone]

## 2017-01-04 ENCOUNTER — Encounter: Payer: Self-pay | Admitting: *Deleted

## 2017-01-04 ENCOUNTER — Other Ambulatory Visit: Payer: Self-pay | Admitting: *Deleted

## 2017-01-04 ENCOUNTER — Other Ambulatory Visit: Payer: Self-pay

## 2017-01-04 MED ORDER — ALBUTEROL SULFATE HFA 108 (90 BASE) MCG/ACT IN AERS: 2.0000 | INHALATION_SPRAY | RESPIRATORY_TRACT | 1 refills | Status: DC | PRN

## 2017-01-04 MED ORDER — FLUTICASONE FUROATE-VILANTEROL 200-25 MCG/INH IN AEPB: 1.0000 | INHALATION_SPRAY | Freq: Every day | RESPIRATORY_TRACT | 2 refills | Status: DC

## 2017-01-04 NOTE — Patient Outreach (Signed)
[facility]) Care Management   01/04/2017  Tammy [name] 08-20-1930 [id number]  Tammy [name] is an 83 y.o. female  Subjective:  ENROLLMENT: Pt remains receptive to enrolling into the [facility] program for community home visits. FALLS: Pt reports a history of falls and lives home alone. Pt has a medical alert emergency band that is active with a good response when the button is pushed. Pt states she leaves her cane in her car and does not use in the home. Pt states her home is clear with no throw rugs. REports her home is wel lift and her bathroom is small enough to getting around in with no problems.  HOME O2: Pt reports she uses her home O2 only when needed and her providers are aware. Pt has a pulse ox she uses when she feels her oxygen levels are down. MEDICAL APPOINTMENTS: Pt states she continues to drive to all her appointments and does not need any additional resources at this time however RN has provided additional resources.  SKIN: Pt states she has a continue itching spell all over her body and has a history proisais with medication she has used in the past but does not use any longer. Pt states she will check with her provider tomorrow morning on a office visit on what to take for her ongoing itching.  SENIOR CELL: Pt has requested resources for a senior cell for possible cell phone.  Objective:   Review of Systems  Constitutional: Negative.   HENT: Negative.   Eyes: Negative.   Respiratory: Negative.   Cardiovascular: Negative.   Gastrointestinal: Negative.   Genitourinary: Negative.   Musculoskeletal: Negative.   Skin: Positive for itching.       Pt experience generalized itching and will address in the AM with her provider  Neurological: Negative.   Endo/Heme/Allergies: Negative.   Psychiatric/Behavioral: Negative.     Physical Exam  Constitutional: She is oriented to person, place, and time. She appears well-developed and well-nourished.  HENT:  Right  Ear: External ear normal.  Left Ear: External ear normal.  Eyes: EOM are normal.  Neck: Normal range of motion.  Cardiovascular: Normal heart sounds.   Respiratory: Effort normal and breath sounds normal.  GI: Soft. Bowel sounds are normal.  Musculoskeletal: Normal range of motion.  Neurological: She is alert and oriented to person, place, and time.  Skin: Skin is warm and dry.  Psychiatric: She has a normal mood and affect. Her behavior is normal. Judgment and thought content normal.    Encounter Medications:   Outpatient Encounter Prescriptions as of 01/04/2017  Medication Sig Note  . aspirin 81 MG tablet Take 81 mg by mouth daily.    [name] atorvastatin (LIPITOR) 10 MG tablet Take 1 tablet (10 mg total) by mouth at bedtime.   . donepezil (ARICEPT) 10 MG tablet Take 1 tablet (10 mg total) by mouth daily.   . DULoxetine (CYMBALTA) 60 MG capsule Take 60 mg by mouth daily. Reported on 08/05/2015 12/13/2016: Patient reports she is taking twice daily/am & PM  . fluticasone (FLONASE) 50 MCG/ACT nasal spray USE ONE TO TWO SPRAY(S) IN EACH NOSTRIL ONCE DAILY FOR  STUFFY  NOSE  OR  DRAINAGE (Patient taking differently: USE ONE TO TWO SPRAY(S) IN EACH NOSTRIL DAILY AS NEEDED FOR  STUFFY  NOSE  OR  DRAINAGE)   . fluticasone furoate-vilanterol (BREO ELLIPTA) 200-25 MCG/INH AEPB Inhale 1 puff into the lungs daily.   . folic acid (FOLVITE) 1 MG tablet  Take 1 tablet (1 mg total) by mouth daily.   . Multiple Vitamins-Minerals (MULTIVITAMIN WITH MINERALS) tablet Take 1 tablet by mouth daily.   . Omega-3 Fatty Acids (FISH OIL) 1000 MG CAPS Take 1 capsule by mouth daily.   [name] omeprazole (PRILOSEC) 20 MG capsule Take 20 mg by mouth 2 (two) times daily.    . Probiotic Product (PROBIOTIC DAILY) CAPS Take 1 capsule by mouth daily.   . ranitidine (ZANTAC) 300 MG tablet TAKE ONE TABLET BY MOUTH AT BEDTIME (Patient taking differently: TAKE 300MG  BY MOUTH AT BEDTIME)   . acetaminophen (TYLENOL) 325 MG tablet Take 650 mg  by mouth 2 (two) times daily.   [name] albuterol (PROAIR HFA) 108 (90 Base) MCG/ACT inhaler Inhale 2 puffs every 4-6 hours as needed for cough or wheeze. (Patient not taking: Reported on 01/04/2017)   . albuterol (VENTOLIN HFA) 108 (90 Base) MCG/ACT inhaler Inhale 2 puffs into the lungs every 4 (four) hours as needed for wheezing or shortness of breath. (Patient not taking: Reported on 01/04/2017)   . metoprolol tartrate (LOPRESSOR) 50 MG tablet Take 1 tablet (50 mg total) by mouth 2 (two) times daily.    No facility-administered encounter medications on file as of 01/04/2017.     Functional Status:   In your present state of health, do you have any difficulty performing the following activities: 12/23/2016 12/14/2016  Hearing? Tempie Donning  Vision? N N  Difficulty concentrating or making decisions? Tempie Donning  Walking or climbing stairs? Y Y  Dressing or bathing? Y Y  Doing errands, shopping? (No Data) Y  Comment Pt states she continue to drives -  Preparing Food and eating ? N -  Using the Toilet? N -  In the past six months, have you accidently leaked urine? Y -  Comment wears incontinent pads -  Do you have problems with loss of bowel control? Y -  Managing your Medications? Y -  Comment daughter assist with medication pillbox -  Managing your Finances? N -  Housekeeping or managing your Housekeeping? Y -  Some recent data might be hidden    Fall/Depression Screening:    Fall Risk  12/23/2016 12/13/2016  Falls in the past year? Yes Yes  Number falls in past yr: 1 1  Injury with Fall? No No  Risk for fall due to : Impaired balance/gait Impaired balance/gait;History of fall(s)  Follow up Falls prevention discussed -   PHQ 2/9 Scores 12/23/2016 12/13/2016  PHQ - 2 Score 0 1   BP (!) 110/58 (BP Location: Left Arm, Patient Position: Sitting, Cuff Size: Normal)   Pulse 68   Resp 20   Ht 1.524 m (5')   SpO2 93%  Assessment:   Fall prevention related to hx of falls Home O2 Follow up on adherence related  to use of DME (cane) Follow up on adherence related to medical appointments Skin issues related to chronic itching (hx. 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 Will verified no rugs or clutter with clear pathways. Will verify no falls since the last conversation. Will strongly encouraged pt to use her cane both inside and outside the home. Will verify pt has a active medical  alert for emergencies wrist band. Will verify home O2 usage (PRN) and encouraged use when needed to prevent SOB or breathing issues (hx of hypoxia).  Will verified place of pt's cane (in car) and encouraged pt to have near by if needed when ambulating in the home and to the car when needed. Will verify pt has sufficient transportation to and from all her medical appointments with no delays. Will offered additional back-up for transportation services however pt declined at this time. Will verify skin irritation and inquired on possible OTC anti-itch cream. Will also encouraged pt to discussed possible irritation with her provider tomorrow morning (history psoriasis) Will provide resources for [facility] for possible inquiry on senior cellular services. Will also provide local cellular services company that may provide senior services.  Plan of care discussed with goals as pt remains receptive. Will also notify pt's primary care provider of pt's disposition with active community case management services.  [name], RN Care Management Coordinator [place] Office [phone]

## 2017-01-05 ENCOUNTER — Other Ambulatory Visit: Payer: Self-pay

## 2017-01-05 DIAGNOSIS — R21 Rash and other nonspecific skin eruption: Secondary | ICD-10-CM | POA: Diagnosis not present

## 2017-01-05 MED ORDER — FLUTICASONE FUROATE-VILANTEROL 200-25 MCG/INH IN AEPB: 1.0000 | INHALATION_SPRAY | Freq: Every day | RESPIRATORY_TRACT | 2 refills | Status: DC

## 2017-01-05 MED ORDER — ALBUTEROL SULFATE HFA 108 (90 BASE) MCG/ACT IN AERS: 2.0000 | INHALATION_SPRAY | RESPIRATORY_TRACT | 1 refills | Status: DC | PRN

## 2017-01-06 ENCOUNTER — Ambulatory Visit: Payer: Self-pay | Admitting: Pharmacist

## 2017-01-07 DIAGNOSIS — L03116 Cellulitis of left lower limb: Secondary | ICD-10-CM | POA: Diagnosis not present

## 2017-01-07 DIAGNOSIS — L03115 Cellulitis of right lower limb: Secondary | ICD-10-CM | POA: Diagnosis not present

## 2017-01-10 ENCOUNTER — Other Ambulatory Visit: Payer: Self-pay | Admitting: Physician Assistant

## 2017-01-10 DIAGNOSIS — R1030 Lower abdominal pain, unspecified: Secondary | ICD-10-CM | POA: Diagnosis not present

## 2017-01-10 DIAGNOSIS — R1032 Left lower quadrant pain: Secondary | ICD-10-CM | POA: Diagnosis not present

## 2017-01-11 DIAGNOSIS — R001 Bradycardia, unspecified: Secondary | ICD-10-CM | POA: Diagnosis not present

## 2017-01-11 DIAGNOSIS — J9601 Acute respiratory failure with hypoxia: Secondary | ICD-10-CM | POA: Diagnosis not present

## 2017-01-12 ENCOUNTER — Other Ambulatory Visit: Payer: Self-pay | Admitting: Physician Assistant

## 2017-01-12 DIAGNOSIS — R1032 Left lower quadrant pain: Secondary | ICD-10-CM

## 2017-01-14 ENCOUNTER — Other Ambulatory Visit: Payer: Self-pay | Admitting: Pharmacy Technician

## 2017-01-14 NOTE — Patient Outreach (Signed)
[facility]) Care Management  01/14/2017  DODI LEU [birth year] [id number]   I contacted patient to follow-up on patient assistance application that was mailed on 01-03-17 for Breo and Ventolin. The patient states that she has received the application but has not had time to complete it. She will mail completed application and documentation to me once she has.  Doreene Burke, [place] [phone]

## 2017-01-18 ENCOUNTER — Other Ambulatory Visit: Payer: PPO

## 2017-01-20 ENCOUNTER — Ambulatory Visit
Admission: RE | Admit: 2017-01-20 | Discharge: 2017-01-20 | Disposition: A | Discharge status: Home / Self Care | Payer: PPO | Source: Ambulatory Visit | Attending: Physician Assistant | Admitting: Physician Assistant

## 2017-01-20 DIAGNOSIS — R1032 Left lower quadrant pain: Secondary | ICD-10-CM

## 2017-01-20 DIAGNOSIS — R1030 Lower abdominal pain, unspecified: Secondary | ICD-10-CM | POA: Diagnosis not present

## 2017-01-21 ENCOUNTER — Other Ambulatory Visit: Payer: Self-pay | Admitting: Allergy and Immunology

## 2017-01-27 ENCOUNTER — Other Ambulatory Visit: Payer: Self-pay | Admitting: Allergy and Immunology

## 2017-02-02 ENCOUNTER — Other Ambulatory Visit: Payer: Self-pay | Admitting: Allergy and Immunology

## 2017-02-02 MED ORDER — FLUTICASONE PROPIONATE 50 MCG/ACT NA SUSP: NASAL | 0 refills | Status: DC

## 2017-02-02 NOTE — Telephone Encounter (Signed)
RF for fluticasone x 1 with no refills at [facility]

## 2017-02-03 ENCOUNTER — Other Ambulatory Visit: Payer: Self-pay | Admitting: *Deleted

## 2017-02-03 NOTE — Patient Outreach (Addendum)
[facility]) Care Management   02/03/2017  Celie M Seger 12/14/1930 [id number]  Mayo M Bink is an 83 y.o. female  Subjective:  FALLS/SAFETY: Pt reports she is using her cane on all her outings however only when needed in the home and when she is weak. Pt states no falls since RN last home visit with no injuries however continues to have leg pain which is ongoing but uses her Tylenol when needed.  MEDICATION: Pt states she had consulted with the referred pharmacy with assistance offered however pt has not completed the paperwork in order to complete this process.  MEDICAL APPOINTMENT: Pt confirms she has been attending all her scheduled appointments with no delays. Pt states she continues to drive herself to her medical appointments.  No community resources needed at this time.  Objective:   Review of Systems  All other systems reviewed and are negative.   Physical Exam  Constitutional: She is oriented to person, place, and time. She appears well-developed and well-nourished.  HENT:  Right Ear: External ear normal.  Left Ear: External ear normal.  Eyes: EOM are normal.  Neck: Normal range of motion.  Cardiovascular: Normal heart sounds.   Respiratory: Effort normal and breath sounds normal.  GI: Soft. Bowel sounds are normal.  Musculoskeletal: Normal range of motion.  Neurological: She is alert and oriented to person, place, and time.  Skin: Skin is warm and dry.  Psychiatric: She has a normal mood and affect. Her behavior is normal. Judgment and thought content normal.    Encounter Medications:   Outpatient Encounter Prescriptions as of 02/03/2017  Medication Sig Note  . acetaminophen (TYLENOL) 325 MG tablet Take 650 mg by mouth 2 (two) times daily.   . albuterol (VENTOLIN HFA) 108 (90 Base) MCG/ACT inhaler Inhale 2 puffs into the lungs every 4 (four) hours as needed for wheezing or shortness of breath.   . aspirin 81 MG tablet Take 81 mg by mouth daily.     . atorvastatin (LIPITOR) 10 MG tablet Take 1 tablet (10 mg total) by mouth at bedtime.   . donepezil (ARICEPT) 10 MG tablet Take 1 tablet (10 mg total) by mouth daily.   . DULoxetine (CYMBALTA) 60 MG capsule Take 60 mg by mouth daily. Reported on 08/05/2015 12/13/2016: Patient reports she is taking twice daily/am & PM  . fluticasone (FLONASE) 50 MCG/ACT nasal spray USE ONE TO TWO SPRAY(S) IN EACH NOSTRIL ONCE DAILY FOR  STUFFY  NOSE  OR  DRAINAGE   . fluticasone furoate-vilanterol (BREO ELLIPTA) 200-25 MCG/INH AEPB Inhale 1 puff into the lungs daily.   . folic acid (FOLVITE) 1 MG tablet Take 1 tablet (1 mg total) by mouth daily.   . metoprolol tartrate (LOPRESSOR) 50 MG tablet Take 50 mg by mouth 2 (two) times daily. Pt reports she is taking this medication   . Multiple Vitamins-Minerals (MULTIVITAMIN WITH MINERALS) tablet Take 1 tablet by mouth daily.   . Omega-3 Fatty Acids (FISH OIL) 1000 MG CAPS Take 1 capsule by mouth daily.   . omeprazole (PRILOSEC) 20 MG capsule Take 20 mg by mouth 2 (two) times daily.    . Probiotic Product (PROBIOTIC DAILY) CAPS Take 1 capsule by mouth daily.   . ranitidine (ZANTAC) 300 MG tablet TAKE ONE TABLET BY MOUTH AT BEDTIME (Patient taking differently: TAKE 300MG BY MOUTH AT BEDTIME)   . BREO ELLIPTA 200-25 MCG/INH AEPB INHALE 1 PUFF INTO LUNGS ONCE DAILY (Patient not taking: Reported on 02/03/2017)   .   fluticasone (FLONASE) 50 MCG/ACT nasal spray USE 1 TO 2 SPRAY(S) IN EACH NOSTRIL ONCE DAILY FOR  STUFFY  NOSE  OR  DRAINAGE (Patient not taking: Reported on 02/03/2017)   . metoprolol tartrate (LOPRESSOR) 50 MG tablet Take 1 tablet (50 mg total) by mouth 2 (two) times daily.    No facility-administered encounter medications on file as of 02/03/2017.     Functional Status:   In your present state of health, do you have any difficulty performing the following activities: 01/04/2017 12/23/2016  Hearing? - Y  Vision? - N  Difficulty concentrating or making decisions? - Y   Walking or climbing stairs? - Y  Dressing or bathing? - Y  Doing errands, shopping? N (No Data)  Comment - Pt states she continue to drives  Preparing Food and eating ? - N  Using the Toilet? - N  In the past six months, have you accidently leaked urine? - Y  Comment - wears incontinent pads  Do you have problems with loss of bowel control? - Y  Managing your Medications? - Y  Comment - daughter assist with medication pillbox  Managing your Finances? - N  Housekeeping or managing your Housekeeping? - Y  Some recent data might be hidden    Fall/Depression Screening:    Fall Risk  12/23/2016 12/13/2016  Falls in the past year? Yes Yes  Number falls in past yr: 1 1  Injury with Fall? No No  Risk for fall due to : Impaired balance/gait Impaired balance/gait;History of fall(s)  Follow up Falls prevention discussed -   PHQ 2/9 Scores 12/23/2016 12/13/2016  PHQ - 2 Score 0 1  BP 112/84 (BP Location: Left Arm, Patient Position: Sitting, Cuff Size: Normal)   Pulse 69   Resp 20   Ht 1.524 m (5')   SpO2 96%   Assessment:   Ongoing follow up related to falls and safety  Follow up on medication adherence related to her daily medications Follow up on adherence to medical appointments  Plan:  Will verified pt has not had any falls or injuries related with ongoing prevention measures in place. Will continue to encourage pt to use her assisted devices (cane) both inside and outside to home to prevent falls (confirmed by pt). Will inquired on additional other DME needed to maintain safety in the home (pt indicates none needed at this time). Will verified pt continues to take her medications as prescribed with enough supplies and no needed refills. Will continue to encouraged adherence as this goal is met via plan of care. Will verified pt continues to attend all medical appointments with no delays. Will continue to stress safety prevention and offer any other transportation services (pt refused  other options for transition of at this time).  Plan of care and goals discussed with two goals met at this time. Will continue to encouraged adherence with safety prevention and follow up with another visit next month for pt's ongoing adherence according to pt's scheduled appointments.   , RN Care Management Coordinator [facility] Main Office [phone]    

## 2017-02-04 ENCOUNTER — Other Ambulatory Visit: Payer: Self-pay | Admitting: Pharmacy Technician

## 2017-02-04 NOTE — Patient Outreach (Signed)
[facility]) Care Management  02/04/2017  Tammy [name] 18-Mar-1931 [id number]  I attempted to contact this patient today to follow-up on the previous conversation that we had on 01/14/2017 about [place] patient assistance application for Breo and Ventolin. As of today I still have not received that patient's portion of the application. She does not have her voicemail set up for messages so I will attempt to call her again next week.   Doreene Burke, [place] [phone]

## 2017-02-07 ENCOUNTER — Other Ambulatory Visit: Payer: Self-pay | Admitting: Pharmacy Technician

## 2017-02-07 ENCOUNTER — Telehealth: Payer: Self-pay | Admitting: Pharmacist

## 2017-02-07 DIAGNOSIS — M7672 Peroneal tendinitis, left leg: Secondary | ICD-10-CM | POA: Diagnosis not present

## 2017-02-07 DIAGNOSIS — M79605 Pain in left leg: Secondary | ICD-10-CM | POA: Diagnosis not present

## 2017-02-07 NOTE — Patient Outreach (Signed)
[facility]) Care Management  02/07/2017  Tammy [name] 05/23/1930 [id number]  I attempted to contact Tammy [name] again today to follow-up on a patient assistance application that was mailed to her last month. The patient did not answer and her voicemail is not set-up. I will send a message to Denyse Amass, Rph since I've had several unsuccessful attempts at contacting this patient.  Doreene Burke, [place] [phone]

## 2017-02-07 NOTE — Patient Outreach (Signed)
[facility]) Care Management  02/07/2017  Tammy [name] 1931-05-17 [id number]   Unsuccessful contact etter being sent to patient after [place], Doreene Burke, has been unable to contact patient via telephone.  If no response after 10 days, patient's case will be closed.    Elayne Guerin, PharmD, [place] Clinical Pharmacist [phone]

## 2017-02-07 NOTE — Telephone Encounter (Signed)
-----   Message from Crista Luria, CPhT sent at 02/07/2017  1:53 PM EDT ----- Regarding: Unable to reach [facility],  I tried to contact Ms. Howson again today in reference to the application that I mailed her last month. She did not answer and does not have a voicemail set up.  Doreene Burke, [place] [phone]

## 2017-02-10 DIAGNOSIS — M5136 Other intervertebral disc degeneration, lumbar region: Secondary | ICD-10-CM | POA: Diagnosis not present

## 2017-02-10 DIAGNOSIS — M5416 Radiculopathy, lumbar region: Secondary | ICD-10-CM | POA: Diagnosis not present

## 2017-02-11 DIAGNOSIS — R001 Bradycardia, unspecified: Secondary | ICD-10-CM | POA: Diagnosis not present

## 2017-02-11 DIAGNOSIS — J9601 Acute respiratory failure with hypoxia: Secondary | ICD-10-CM | POA: Diagnosis not present

## 2017-02-14 ENCOUNTER — Encounter: Payer: Self-pay | Admitting: Rehabilitation

## 2017-02-14 ENCOUNTER — Ambulatory Visit: Payer: PPO | Attending: Family Medicine | Admitting: Rehabilitation

## 2017-02-14 DIAGNOSIS — R2689 Other abnormalities of gait and mobility: Secondary | ICD-10-CM | POA: Diagnosis not present

## 2017-02-14 DIAGNOSIS — M6281 Muscle weakness (generalized): Secondary | ICD-10-CM | POA: Insufficient documentation

## 2017-02-14 NOTE — Therapy (Signed)
[place], [place], [address] Phone: [phone]   Fax:  [phone]  Physical Therapy Evaluation  Patient Details  Name: Tammy [name] MRN: [id number] Date of Birth: 04-Jul-1930 Referring Provider: Lona Kettle MD  Encounter Date: 02/14/2017      PT End of Session - 02/14/17 1123    Visit Number 1   Number of Visits 12   Date for PT Re-Evaluation 03/28/17   Authorization Type Medicare - KX visit 18   PT Start Time 0945   PT Stop Time 1018   PT Time Calculation (min) 33 min   Activity Tolerance Patient tolerated treatment well   Behavior During Therapy [facility] for tasks assessed/performed      Past Medical History:  Diagnosis Date  . Acid reflux   . Asthma    Asthmatic COPD  . Atrial fibrillation ([place]) 01/01/2016  . Coronary artery disease    Echo 07/27/2011   . Coronary artery stenosis    , High-grade left main  . GERD (gastroesophageal reflux disease)   . Hyperlipidemia   . Hypertension   . IBS (irritable bowel syndrome)   . Psoriasis     Past Surgical History:  Procedure Laterality Date  . CARDIAC SURGERY    . CORONARY ARTERY BYPASS GRAFT  1994   After catheterization showed 90% ostial left main stenosis  . DOPPLER ECHOCARDIOGRAPHY     Study showed mild to moderate MR with moderate TR, mild to moderate pulmonary hypertension with an ejection fraction greater that 55%.   [name] EYE SURGERY    . Nuclear Perfusion  10/2010, 11/15/2012   Showed normal perfusion, No Ischemia or Infarction, Normal EF  . Renal Scan Duplex  04/09/2012   Showing greater than 50% diameter reduction in the celiac artery and SMA.. Right proximal 275 and Mid 152.  [name] ROTATOR CUFF REPAIR     bilateral  . Sleep Study  12/28/2006   AHI during total sleep time (3h 19 minutes) was 1.81/hr and during REM sleep at 17.78/hr. Mild sleep apnea during REM sleep.Oxygen staturated rate during REM and NREM was 93.0%.     There were no vitals filed for this visit.       Subjective Assessment - 02/14/17 0949    Subjective Pt presents with complaints of difficulty getting around history of falls.  Balance is my number one problem.  (referral is for L peroneal strain but pt reports this is not an issue and that she has just had cellulitis in the legs recently)   Pertinent History LE cellulitis, history of falls with hospital stays, bypass 15 years ago, lumbar stenosis with recent injections, SOB   Limitations Walking   How long can you walk comfortably? walk 5 minutes   Patient Stated Goals walk better, improve balance   Currently in Pain? No/denies            [facility] PT Assessment - 02/14/17 0001      Assessment   Medical Diagnosis L leg pain referral; balance and gait more appropriate   Referring Provider Lona Kettle MD   Onset Date/Surgical Date 12/15/16   Next MD Visit unknown   Prior Therapy rehab only     Precautions   Precautions None;Fall     Balance Screen   Has the patient fallen in the past 6 months Yes   How many times? 1  reports that she cant' get up if she falls   Has  the patient had a decrease in activity level because of a fear of falling?  Yes   Is the patient reluctant to leave their home because of a fear of falling?  Yes     [place] residence   Living Arrangements Alone   Type of [place] One level  has a few stairs with 2 [place] - single point;Cane - quad;Walker - 2 wheels;Walker - 4 wheels     Prior Function   Level of Independence Independent with basic ADLs   Vocation Retired   [employer] used to be a Regulatory affairs officer     Observation/Other Assessments   Focus on Therapeutic Outcomes (FOTO)  56%     Sensation   Additional Comments reports intact     Ambulation/Gait   Assistive device Straight cane   Gait Pattern Decreased step length - right;Decreased step length -  left;Wide base of support     6 Minute Walk- Baseline   6 Minute Walk- Baseline --     Standardized Balance Assessment   Standardized Balance Assessment Berg Balance Test     Berg Balance Test   Sit to Stand Able to stand  independently using hands   Standing Unsupported Able to stand safely 2 minutes   Sitting with Back Unsupported but Feet Supported on Floor or Stool Able to sit safely and securely 2 minutes   Stand to Sit Controls descent by using hands   Transfers Able to transfer safely, minor use of hands   Standing Unsupported with Eyes Closed Able to stand 10 seconds with supervision   Standing Ubsupported with Feet Together Able to place feet together independently and stand for 1 minute with supervision   From Standing, Reach Forward with Outstretched Arm Loses balance while trying/requires external support   From Standing Position, Pick up Object from Floor Unable to try/needs assist to keep balance   From Standing Position, Turn to Look Behind Over each Shoulder Turn sideways only but maintains balance   Turn 360 Degrees Able to turn 360 degrees safely but slowly   Standing Unsupported, Alternately Place Feet on Step/Stool Able to complete >2 steps/needs minimal assist   Standing Unsupported, One Foot in [employer] balance while stepping or standing   Standing on One Leg Unable to try or needs assist to prevent fall   Total Score 29   Berg comment: high risk for falls            Objective measurements completed on examination: See above findings.                  PT Education - 02/14/17 1123    Education provided Yes   Education Details Diagnosis, POC   Person(s) Educated Patient   Methods Explanation   Comprehension Verbalized understanding          PT Short Term Goals - 02/14/17 1132      PT SHORT TERM GOAL #1   Title Pt will complete 46min walk test and seated MMT with appropriate LTG   Time 2   Period Weeks   Status New   Target Date  02/28/17           PT Long Term Goals - 02/14/17 1131      PT LONG TERM GOAL #1   Title Pt will improve BERG score to 35/56 or greater to demonstrate improved balance   Time 6  Period Weeks   Status New   Target Date 03/28/17     PT LONG TERM GOAL #2   Title Pt will ambulate safely around clinic and in the outside enviroment without any LOB with LRAD   Time 6   Period Weeks   Status New   Target Date 03/28/17                Plan - 02/14/17 1125    Clinical Impression Statement Pt presents with referral for L peroneal strain from the MD but the pt reports that this is not an issue and that she actually has cellulitis in the LEs that causes leg pain.  Pt reports that she would like to work on balance and fall prevention in treatment.  Pt with long history of falls with multiple hospitalizations.  Reports most LOB occurs if she encounters an obstacle in the house or with walking outside.  Uses an AD only out of the house from [facility] to rollator walker.  Pt showing up at incorrect appointment time so limited eval performed today.    History and Personal Factors relevant to plan of care: history of falls, heart bypass, cellulitis, SOB (takes oxygen as needed)   Clinical Presentation Stable   Clinical Decision Making Moderate   Rehab Potential Fair   Clinical Impairments Affecting Rehab Potential medical history   PT Frequency 2x / week   PT Duration 6 weeks   PT Treatment/Interventions Stair training;Gait training;Therapeutic exercise;Patient/family education;Neuromuscular re-education   PT Next Visit Plan add to objective findings; 32min walk test, strength testing LEs in seated; begin balance and gait   PT Home Exercise Plan needs handout   Consulted and Agree with Plan of Care Patient      Patient will benefit from skilled therapeutic intervention in order to improve the following deficits and impairments:  Abnormal gait, Difficulty walking, Decreased balance, Decreased  strength  Visit Diagnosis: Other abnormalities of gait and mobility - Plan: PT plan of care cert/re-cert  Muscle weakness (generalized) - Plan: PT plan of care cert/re-cert      G-Codes - 22/97/98 1134    Functional Assessment Tool Used (Outpatient Only) FOTO    Functional Limitation Mobility: Walking and moving around   Mobility: Walking and Moving Around Current Status (X2119) At least 40 percent but less than 60 percent impaired, limited or restricted   Mobility: Walking and Moving Around Goal Status (E1740) At least 40 percent but less than 60 percent impaired, limited or restricted       Problem List Patient Active Problem List   Diagnosis Date Noted  . Left groin pain 12/09/2016  . Acute respiratory failure with hypoxia ([place]) 12/08/2016  . Hypothermia 02/20/2016  . SIRS (systemic inflammatory response syndrome) ([place]) 02/19/2016  . Diverticulosis   . Encephalopathy   . Hyponatremia 02/14/2016  . Acute encephalopathy 02/14/2016  . Elevated lactic acid level 02/14/2016  . Dizziness 02/14/2016  . Abdominal pain 02/14/2016  . Symptomatic bradycardia 02/12/2016  . Leukocytosis 02/12/2016  . Depression 02/12/2016  . Renal insufficiency   . SOB (shortness of breath)   . Bradycardia on ECG 02/11/2016  . Atrial fibrillation with RVR ([place]) 01/01/2016  . PAF (paroxysmal atrial fibrillation) ([place]) 01/01/2016  . Alcohol dependence with withdrawal with complication ([place]) 81/44/8185  . Fall 01/01/2016  . AKI (acute kidney injury) ([place])   . Dehydration   . Exertional dyspnea 09/11/2015  . Memory loss 11/13/2013  . Abnormal involuntary movements(781.0) 11/13/2013  . Pulmonary hypertension (  [place]) 11/02/2013  . Generalized weakness 11/02/2013  . Coronary artery disease involving native coronary artery of native heart with angina pectoris ([place]) 11/16/2012  . Mitral regurgitation 11/16/2012  . Renal artery stenosis ([place]) 11/16/2012  . Hyperlipidemia with target LDL less than 70  11/16/2012  . Essential hypertension 11/16/2012    Stark Bray, DPT, CMP 02/14/2017, 11:36 AM  [place], [place], [address] Phone: [phone]   Fax:  [phone]  Name: Tammy [name] MRN: [id number] Date of Birth: 06-05-1930

## 2017-02-15 ENCOUNTER — Ambulatory Visit (INDEPENDENT_AMBULATORY_CARE_PROVIDER_SITE_OTHER): Payer: PPO | Admitting: Allergy and Immunology

## 2017-02-15 ENCOUNTER — Encounter: Payer: Self-pay | Admitting: Allergy and Immunology

## 2017-02-15 VITALS — BP 142/68 | HR 68 | Resp 22

## 2017-02-15 DIAGNOSIS — K219 Gastro-esophageal reflux disease without esophagitis: Secondary | ICD-10-CM | POA: Diagnosis not present

## 2017-02-15 DIAGNOSIS — J4550 Severe persistent asthma, uncomplicated: Secondary | ICD-10-CM | POA: Diagnosis not present

## 2017-02-15 DIAGNOSIS — J3089 Other allergic rhinitis: Secondary | ICD-10-CM | POA: Diagnosis not present

## 2017-02-15 DIAGNOSIS — I272 Pulmonary hypertension, unspecified: Secondary | ICD-10-CM

## 2017-02-15 NOTE — Progress Notes (Signed)
Follow-up Note  Referring Provider: [name], MD Primary Provider: [name], MD Date of Office Visit: 02/15/2017  Subjective:   Tammy [name] (DOB: [birth year]) is a 83 y.o. female who returns to the [facility] [place] on 02/15/2017 in re-evaluation of the following:  HPI: Tammy [name] returns to this clinic in reevaluation of her asthma and allergic rhinitis and reflux-induced respiratory disease. Her last visit to this clinic was March 2018.  She tells me that her asthma is doing well. She has not been having any problems that required her to use a short acting bronchodilator. She does not really exercise. She has been consistently using a combination inhaler one time per day.  Tammy [name] informs me that she was hospitalized in July for cellulitis. I reviewed the medical record and she was apparently admitted to the hospital for hypoxic event with altered mental status. Apparently this was secondary to her asthma but it's a very confusing story about the exact trigger for this issue but certainly she did improve and basically resolved this issue within a week or so.  She has not been having any problems with her nose.  She has not been having any problems with her reflux.  She does inform me that she apparently had cellulitis involving her lower extremities that was treated with multiple antibiotics. At this point in time she has no problems with her legs.  Allergies as of 02/15/2017      Reactions   Penicillins Hives   Has patient had a PCN reaction causing immediate rash, facial/tongue/throat swelling, SOB or lightheadedness with hypotension: No Has patient had a PCN reaction causing severe rash involving mucus membranes or skin necrosis: Yes Has patient had a PCN reaction that required hospitalization No Has patient had a PCN reaction occurring within the last 10 years: Yes took again last year 2016 If all of the above answers are "NO", then may proceed with  Cephalosporin use.   Naprosyn [naproxen]    Lactose Intolerance (gi) Other (See Comments)   Unknown- [facility] documented      Medication List      acetaminophen 325 MG tablet Commonly known as:  TYLENOL Take 650 mg by mouth 2 (two) times daily.   albuterol 108 (90 Base) MCG/ACT inhaler Commonly known as:  VENTOLIN HFA Inhale 2 puffs into the lungs every 4 (four) hours as needed for wheezing or shortness of breath.   aspirin 81 MG tablet Take 81 mg by mouth daily.   atorvastatin 10 MG tablet Commonly known as:  LIPITOR Take 1 tablet (10 mg total) by mouth at bedtime.   donepezil 10 MG tablet Commonly known as:  ARICEPT Take 1 tablet (10 mg total) by mouth daily.   DULoxetine 60 MG capsule Commonly known as:  CYMBALTA Take 60 mg by mouth daily. Reported on 08/05/2015   Fish Oil 1000 MG Caps Take 1 capsule by mouth daily.   fluticasone 50 MCG/ACT nasal spray Commonly known as:  FLONASE USE 1 TO 2 SPRAY(S) IN EACH NOSTRIL ONCE DAILY FOR  STUFFY  NOSE  OR  DRAINAGE   fluticasone furoate-vilanterol 200-25 MCG/INH Aepb Commonly known as:  BREO ELLIPTA Inhale 1 puff into the lungs daily.   folic acid 1 MG tablet Commonly known as:  FOLVITE Take 1 tablet (1 mg total) by mouth daily.   metoprolol tartrate 50 MG tablet Commonly known as:  LOPRESSOR Take 50 mg by mouth 2 (two) times daily.  Pt reports she is taking this medication   metoprolol tartrate 50 MG tablet Commonly known as:  LOPRESSOR Take 1 tablet (50 mg total) by mouth 2 (two) times daily.   multivitamin with minerals tablet Take 1 tablet by mouth daily.   omeprazole 20 MG capsule Commonly known as:  PRILOSEC Take 20 mg by mouth 2 (two) times daily.   PROBIOTIC DAILY Caps Take 1 capsule by mouth daily.   ranitidine 300 MG tablet Commonly known as:  ZANTAC TAKE ONE TABLET BY MOUTH AT BEDTIME       Past Medical History:  Diagnosis Date  . Acid reflux   . Asthma     Asthmatic COPD  . Atrial fibrillation ([place]) 01/01/2016  . Coronary artery disease    Echo 07/27/2011   . Coronary artery stenosis    , High-grade left main  . GERD (gastroesophageal reflux disease)   . Hyperlipidemia   . Hypertension   . IBS (irritable bowel syndrome)   . Psoriasis     Past Surgical History:  Procedure Laterality Date  . CARDIAC SURGERY    . CORONARY ARTERY BYPASS GRAFT  1994   After catheterization showed 90% ostial left main stenosis  . DOPPLER ECHOCARDIOGRAPHY     Study showed mild to moderate MR with moderate TR, mild to moderate pulmonary hypertension with an ejection fraction greater that 55%.   [name] EYE SURGERY    . Nuclear Perfusion  10/2010, 11/15/2012   Showed normal perfusion, No Ischemia or Infarction, Normal EF  . Renal Scan Duplex  04/09/2012   Showing greater than 50% diameter reduction in the celiac artery and SMA.. Right proximal 275 and Mid 152.  [name] ROTATOR CUFF REPAIR     bilateral  . Sleep Study  12/28/2006   AHI during total sleep time (3h 19 minutes) was 1.81/hr and during REM sleep at 17.78/hr. Mild sleep apnea during REM sleep.Oxygen staturated rate during REM and NREM was 93.0%.    Review of systems negative except as noted in HPI / PMHx or noted below:  Review of Systems  Constitutional: Negative.   HENT: Negative.   Eyes: Negative.   Respiratory: Negative.   Cardiovascular: Negative.   Gastrointestinal: Negative.   Genitourinary: Negative.   Musculoskeletal: Negative.   Skin: Negative.   Neurological: Negative.   Endo/Heme/Allergies: Negative.   Psychiatric/Behavioral: Negative.      Objective:   Vitals:   02/15/17 1554  BP: (!) 142/68  Pulse: 68  Resp: (!) 22  SpO2: 96%          Physical Exam  Constitutional: She is well-developed, well-nourished, and in no distress.  HENT:  Head: Normocephalic.  Right Ear: Tympanic membrane, external ear and ear canal normal.  Left Ear: Tympanic membrane, external ear and ear  canal normal.  Nose: Nose normal. No mucosal edema or rhinorrhea.  Mouth/Throat: Uvula is midline, oropharynx is clear and moist and mucous membranes are normal. No oropharyngeal exudate.  Eyes: Conjunctivae are normal.  Neck: Trachea normal. No tracheal tenderness present. No tracheal deviation present. No thyromegaly present.  Cardiovascular: Normal rate, regular rhythm, S1 normal, S2 normal and normal heart sounds.   No murmur heard. Pulmonary/Chest: Breath sounds normal. No stridor. No respiratory distress. She has no wheezes. She has no rales.  Musculoskeletal: She exhibits edema (pitting edema lower extremities bilaterally up to mid shin).  Lymphadenopathy:       Head (right side): No tonsillar adenopathy present.       Head (left  side): No tonsillar adenopathy present.    She has no cervical adenopathy.  Neurological: She is alert. Gait normal.  Skin: No rash noted. She is not diaphoretic. No erythema. Nails show no clubbing.  Psychiatric: Mood and affect normal.    Diagnostics:    Spirometry was performed and demonstrated an FEV1 of 1.05 at 74 % of predicted.  The patient had an Asthma Control Test with the following results: ACT Total Score: 19.    Assessment and Plan:   1. Asthma, severe persistent, well-controlled   2. Other allergic rhinitis   3. LPRD (laryngopharyngeal reflux disease)   4. Pulmonary hypertension ([place])     1. Use the following every day:   A. Breo 200 one inhalation one time per day  B. Omeprazole 20mg  one tablet two times per day  C. Ranitidine 300 one time per day - PM  D. flonase one spray each nostril 3 times per week  2. If Needed:   A. Ventolin HFA 2 puffs or DuoNeb nebulization every 4-6 hours  B. Cetirizine  3. Return in 12 weeks or earlier if problem  4. Obtain a flu vaccine  Tammy [name] appears to be doing okay and I have encouraged her to consistently use the medications noted above directed against respiratory tract inflammation of  both her upper and lower airways and therapy directed against reflux. If she continues to do okay I will see her back in this clinic in 12 weeks or earlier if there is a problem.  Allena Katz, MD Allergy / Immunology [place]

## 2017-02-15 NOTE — Patient Instructions (Addendum)
  1. Use the following every day:   A. Breo 200 one inhalation one time per day  B. Omeprazole 20mg  one tablet two times per day  C. Ranitidine 300 one time per day - PM  D. flonase one spray each nostril 3 times per week  2. If Needed:   A. Ventolin HFA 2 puffs or DuoNeb nebulization every 4-6 hours  B. Cetirizine  3. Return in 12 weeks or earlier if problem  4. Obtain a flu vaccine

## 2017-02-16 ENCOUNTER — Ambulatory Visit: Payer: PPO | Admitting: Physical Therapy

## 2017-02-16 ENCOUNTER — Encounter: Payer: Self-pay | Admitting: Physical Therapy

## 2017-02-16 DIAGNOSIS — M6281 Muscle weakness (generalized): Secondary | ICD-10-CM

## 2017-02-16 DIAGNOSIS — R2689 Other abnormalities of gait and mobility: Secondary | ICD-10-CM

## 2017-02-16 NOTE — Therapy (Signed)
[place], [place], [address] Phone: [phone]   Fax:  [phone]  Physical Therapy Treatment  Patient Details  Name: Tammy [name] MRN: [id number] Date of Birth: May 20, 1930 Referring Provider: Lona Kettle MD  Encounter Date: 02/16/2017      PT End of Session - 02/16/17 1513    Visit Number 2   PT Start Time 5009   PT Stop Time 3818   PT Time Calculation (min) 49 min   Activity Tolerance Patient tolerated treatment well   Behavior During Therapy [facility] for tasks assessed/performed      Past Medical History:  Diagnosis Date  . Acid reflux   . Asthma    Asthmatic COPD  . Atrial fibrillation ([place]) 01/01/2016  . Coronary artery disease    Echo 07/27/2011   . Coronary artery stenosis    , High-grade left main  . GERD (gastroesophageal reflux disease)   . Hyperlipidemia   . Hypertension   . IBS (irritable bowel syndrome)   . Psoriasis     Past Surgical History:  Procedure Laterality Date  . CARDIAC SURGERY    . CORONARY ARTERY BYPASS GRAFT  1994   After catheterization showed 90% ostial left main stenosis  . DOPPLER ECHOCARDIOGRAPHY     Study showed mild to moderate MR with moderate TR, mild to moderate pulmonary hypertension with an ejection fraction greater that 55%.   [name] EYE SURGERY    . Nuclear Perfusion  10/2010, 11/15/2012   Showed normal perfusion, No Ischemia or Infarction, Normal EF  . Renal Scan Duplex  04/09/2012   Showing greater than 50% diameter reduction in the celiac artery and SMA.. Right proximal 275 and Mid 152.  [name] ROTATOR CUFF REPAIR     bilateral  . Sleep Study  12/28/2006   AHI during total sleep time (3h 19 minutes) was 1.81/hr and during REM sleep at 17.78/hr. Mild sleep apnea during REM sleep.Oxygen staturated rate during REM and NREM was 93.0%.    There were no vitals filed for this visit.      Subjective Assessment - 02/16/17 1425    Subjective Pt  reports that she has a tiny pinch in her back but she did not know why since she had two epidurals in her back last week   Currently in Pain? Yes   Pain Score 1    Pain Location Back   Pain Descriptors / Indicators Other (Comment)  pinch            [facility] PT Assessment - 02/16/17 0001      ROM / Strength   AROM / PROM / Strength Strength     Strength   Strength Assessment Site Knee   Right/Left Knee Right;Left   Right Knee Flexion 4/5   Right Knee Extension 4/5   Left Knee Flexion 4/5   Left Knee Extension 4/5                     [facility] Adult PT Treatment/Exercise - 02/16/17 0001      High Level Balance   High Level Balance Activities Marching forwards;Side stepping;Backward walking   High Level Balance Comments side stepping over poole noodle      Exercises   Exercises Lumbar;Knee/Hip     Lumbar Exercises: Seated   Long Arc Quad on Chair Both;Strengthening;2 sets;10 reps   LAQ on Chair Weights (lbs) 3   Sit to Stand 10 reps  2 sets, no UE, 2ns set holding green ball      Knee/Hip Exercises: Seated   Hamstring Curl Both;2 sets;20 reps   Hamstring Limitations red tband                  PT Short Term Goals - 02/14/17 1132      PT SHORT TERM GOAL #1   Title Pt will complete 71min walk test and seated MMT with appropriate LTG   Time 2   Period Weeks   Status New   Target Date 02/28/17           PT Long Term Goals - 02/14/17 1131      PT LONG TERM GOAL #1   Title Pt will improve BERG score to 35/56 or greater to demonstrate improved balance   Time 6   Period Weeks   Status New   Target Date 03/28/17     PT LONG TERM GOAL #2   Title Pt will ambulate safely around clinic and in the outside enviroment without any LOB with LRAD   Time 6   Period Weeks   Status New   Target Date 03/28/17               Plan - 02/16/17 1514    Clinical Impression Statement Pt tolerated an initial progression to TE well. Cues provided to  get RLE higher with froward marching. Pt with some instability when side stepping over pool noodle. Cues to increase step length with LLE with backwards walking.    Clinical Impairments Affecting Rehab Potential medical history   PT Frequency 2x / week   PT Duration 6 weeks   PT Treatment/Interventions Stair training;Gait training;Therapeutic exercise;Patient/family education;Neuromuscular re-education   PT Next Visit Plan add to objective findings; 2min walk test, begin balance and gait      Patient will benefit from skilled therapeutic intervention in order to improve the following deficits and impairments:  Abnormal gait, Difficulty walking, Decreased balance, Decreased strength  Visit Diagnosis: Muscle weakness (generalized)  Other abnormalities of gait and mobility     Problem List Patient Active Problem List   Diagnosis Date Noted  . Left groin pain 12/09/2016  . Acute respiratory failure with hypoxia ([place]) 12/08/2016  . Hypothermia 02/20/2016  . SIRS (systemic inflammatory response syndrome) ([place]) 02/19/2016  . Diverticulosis   . Encephalopathy   . Hyponatremia 02/14/2016  . Acute encephalopathy 02/14/2016  . Elevated lactic acid level 02/14/2016  . Dizziness 02/14/2016  . Abdominal pain 02/14/2016  . Symptomatic bradycardia 02/12/2016  . Leukocytosis 02/12/2016  . Depression 02/12/2016  . Renal insufficiency   . SOB (shortness of breath)   . Bradycardia on ECG 02/11/2016  . Atrial fibrillation with RVR ([place]) 01/01/2016  . PAF (paroxysmal atrial fibrillation) ([place]) 01/01/2016  . Alcohol dependence with withdrawal with complication ([place]) 17/61/6073  . Fall 01/01/2016  . AKI (acute kidney injury) ([place])   . Dehydration   . Exertional dyspnea 09/11/2015  . Memory loss 11/13/2013  . Abnormal involuntary movements(781.0) 11/13/2013  . Pulmonary hypertension ([place]) 11/02/2013  . Generalized weakness 11/02/2013  . Coronary artery disease involving native coronary  artery of native heart with angina pectoris ([place]) 11/16/2012  . Mitral regurgitation 11/16/2012  . Renal artery stenosis ([place]) 11/16/2012  . Hyperlipidemia with target LDL less than 70 11/16/2012  . Essential hypertension 11/16/2012    Scot Jun, PTA 02/16/2017, 3:16 PM  [place]  [place], [place], [address] Phone: [phone]   Fax:  [phone]  Name: Tammy [name] MRN: [id number] Date of Birth: Apr 21, 1931

## 2017-02-21 ENCOUNTER — Ambulatory Visit: Payer: PPO | Admitting: Physical Therapy

## 2017-02-21 ENCOUNTER — Encounter: Payer: Self-pay | Admitting: Physical Therapy

## 2017-02-21 DIAGNOSIS — R2689 Other abnormalities of gait and mobility: Secondary | ICD-10-CM

## 2017-02-21 DIAGNOSIS — M6281 Muscle weakness (generalized): Secondary | ICD-10-CM

## 2017-02-21 NOTE — Therapy (Signed)
[place], [place], [address] Phone: [phone]   Fax:  [phone]  Physical Therapy Treatment  Patient Details  Name: Tammy [name] MRN: [id number] Date of Birth: 12/30/1930 Referring Provider: Lona Kettle MD  Encounter Date: 02/21/2017      PT End of Session - 02/21/17 1647    Visit Number 3   Number of Visits 12   Date for PT Re-Evaluation 03/28/17   Authorization Type Medicare - KX visit 18   PT Start Time 1520   PT Stop Time 1605   PT Time Calculation (min) 45 min   Activity Tolerance Patient tolerated treatment well   Behavior During Therapy [facility] for tasks assessed/performed      Past Medical History:  Diagnosis Date  . Acid reflux   . Asthma    Asthmatic COPD  . Atrial fibrillation ([place]) 01/01/2016  . Coronary artery disease    Echo 07/27/2011   . Coronary artery stenosis    , High-grade left main  . GERD (gastroesophageal reflux disease)   . Hyperlipidemia   . Hypertension   . IBS (irritable bowel syndrome)   . Psoriasis     Past Surgical History:  Procedure Laterality Date  . CARDIAC SURGERY    . CORONARY ARTERY BYPASS GRAFT  1994   After catheterization showed 90% ostial left main stenosis  . DOPPLER ECHOCARDIOGRAPHY     Study showed mild to moderate MR with moderate TR, mild to moderate pulmonary hypertension with an ejection fraction greater that 55%.   [name] EYE SURGERY    . Nuclear Perfusion  10/2010, 11/15/2012   Showed normal perfusion, No Ischemia or Infarction, Normal EF  . Renal Scan Duplex  04/09/2012   Showing greater than 50% diameter reduction in the celiac artery and SMA.. Right proximal 275 and Mid 152.  [name] ROTATOR CUFF REPAIR     bilateral  . Sleep Study  12/28/2006   AHI during total sleep time (3h 19 minutes) was 1.81/hr and during REM sleep at 17.78/hr. Mild sleep apnea during REM sleep.Oxygen staturated rate during REM and NREM was 93.0%.    There  were no vitals filed for this visit.      Subjective Assessment - 02/21/17 1530    Subjective Reports that she is still hurting in the hip but reports very worried about balance    Currently in Pain? Yes   Pain Score 3    Pain Location Back   Pain Orientation Lower                         [facility] Adult PT Treatment/Exercise - 02/21/17 0001      Ambulation/Gait   Gait Comments outside on uneven surface with SPC, she is very fearful of this especially grass and slopes     High Level Balance   High Level Balance Activities Marching forwards;Side stepping;Backward walking;Negotitating around obstacles;Negotiating over obstacles;Tandem walking;Direction changes;Head turns   High Level Balance Comments on airex activities, ball tosses     Knee/Hip Exercises: Aerobic   Nustep Level 5 x 6 minutes                  PT Short Term Goals - 02/14/17 1132      PT SHORT TERM GOAL #1   Title Pt will complete 83min walk test and seated MMT with appropriate LTG   Time 2   Period Weeks  Status New   Target Date 02/28/17           PT Long Term Goals - 02/14/17 1131      PT LONG TERM GOAL #1   Title Pt will improve BERG score to 35/56 or greater to demonstrate improved balance   Time 6   Period Weeks   Status New   Target Date 03/28/17     PT LONG TERM GOAL #2   Title Pt will ambulate safely around clinic and in the outside enviroment without any LOB with LRAD   Time 6   Period Weeks   Status New   Target Date 03/28/17               Plan - 02/21/17 1656    Clinical Impression Statement very fearful of uneven surfaces.  She had a lot of difficulty on the airex and when looking up   PT Next Visit Plan continue to work on balance, head turns, dynamic surfaces   Consulted and Agree with Plan of Care Patient      Patient will benefit from skilled therapeutic intervention in order to improve the following deficits and impairments:  Abnormal gait,  Difficulty walking, Decreased balance, Decreased strength  Visit Diagnosis: Muscle weakness (generalized)  Other abnormalities of gait and mobility     Problem List Patient Active Problem List   Diagnosis Date Noted  . Left groin pain 12/09/2016  . Acute respiratory failure with hypoxia ([place]) 12/08/2016  . Hypothermia 02/20/2016  . SIRS (systemic inflammatory response syndrome) ([place]) 02/19/2016  . Diverticulosis   . Encephalopathy   . Hyponatremia 02/14/2016  . Acute encephalopathy 02/14/2016  . Elevated lactic acid level 02/14/2016  . Dizziness 02/14/2016  . Abdominal pain 02/14/2016  . Symptomatic bradycardia 02/12/2016  . Leukocytosis 02/12/2016  . Depression 02/12/2016  . Renal insufficiency   . SOB (shortness of breath)   . Bradycardia on ECG 02/11/2016  . Atrial fibrillation with RVR ([place]) 01/01/2016  . PAF (paroxysmal atrial fibrillation) ([place]) 01/01/2016  . Alcohol dependence with withdrawal with complication ([place]) 26/94/8546  . Fall 01/01/2016  . AKI (acute kidney injury) ([place])   . Dehydration   . Exertional dyspnea 09/11/2015  . Memory loss 11/13/2013  . Abnormal involuntary movements(781.0) 11/13/2013  . Pulmonary hypertension ([place]) 11/02/2013  . Generalized weakness 11/02/2013  . Coronary artery disease involving native coronary artery of native heart with angina pectoris ([place]) 11/16/2012  . Mitral regurgitation 11/16/2012  . Renal artery stenosis ([place]) 11/16/2012  . Hyperlipidemia with target LDL less than 70 11/16/2012  . Essential hypertension 11/16/2012    Sumner Boast., PT 02/21/2017, 4:58 PM  [place], [place], [address] Phone: [phone]   Fax:  [phone]  Name: Tammy [name] MRN: [id number] Date of Birth: 1930/12/27

## 2017-02-22 ENCOUNTER — Telehealth: Payer: Self-pay | Admitting: Allergy and Immunology

## 2017-02-22 MED ORDER — FLUTICASONE FUROATE-VILANTEROL 200-25 MCG/INH IN AEPB: 1.0000 | INHALATION_SPRAY | Freq: Every day | RESPIRATORY_TRACT | 3 refills | Status: DC

## 2017-02-22 NOTE — Telephone Encounter (Signed)
Pt called and needs to have Bre[facility] called into [facility] [phone]

## 2017-02-23 ENCOUNTER — Ambulatory Visit: Payer: PPO | Admitting: Physical Therapy

## 2017-02-23 ENCOUNTER — Encounter: Payer: Self-pay | Admitting: Physical Therapy

## 2017-02-23 DIAGNOSIS — M6281 Muscle weakness (generalized): Secondary | ICD-10-CM

## 2017-02-23 DIAGNOSIS — R2689 Other abnormalities of gait and mobility: Secondary | ICD-10-CM

## 2017-02-23 NOTE — Therapy (Signed)
[place], [place], [address] Phone: [phone]   Fax:  [phone]  Physical Therapy Treatment  Patient Details  Name: Tammy [name] MRN: [id number] Date of Birth: [birth year] Referring Provider: Lona Kettle MD  Encounter Date: 02/23/2017      PT End of Session - 02/23/17 1425    Visit Number 4   Date for PT Re-Evaluation 03/28/17   PT Start Time 9735   PT Stop Time 1436   PT Time Calculation (min) 41 min   Activity Tolerance Patient tolerated treatment well   Behavior During Therapy [facility] for tasks assessed/performed      Past Medical History:  Diagnosis Date  . Acid reflux   . Asthma    Asthmatic COPD  . Atrial fibrillation ([place]) 01/01/2016  . Coronary artery disease    Echo 07/27/2011   . Coronary artery stenosis    , High-grade left main  . GERD (gastroesophageal reflux disease)   . Hyperlipidemia   . Hypertension   . IBS (irritable bowel syndrome)   . Psoriasis     Past Surgical History:  Procedure Laterality Date  . CARDIAC SURGERY    . CORONARY ARTERY BYPASS GRAFT  1994   After catheterization showed 90% ostial left main stenosis  . DOPPLER ECHOCARDIOGRAPHY     Study showed mild to moderate MR with moderate TR, mild to moderate pulmonary hypertension with an ejection fraction greater that 55%.   [name] EYE SURGERY    . Nuclear Perfusion  10/2010, 11/15/2012   Showed normal perfusion, No Ischemia or Infarction, Normal EF  . Renal Scan Duplex  04/09/2012   Showing greater than 50% diameter reduction in the celiac artery and SMA.. Right proximal 275 and Mid 152.  [name] ROTATOR CUFF REPAIR     bilateral  . Sleep Study  12/28/2006   AHI during total sleep time (3h 19 minutes) was 1.81/hr and during REM sleep at 17.78/hr. Mild sleep apnea during REM sleep.Oxygen staturated rate during REM and NREM was 93.0%.    There were no vitals filed for this visit.      Subjective  Assessment - 02/23/17 1348    Subjective reports some of the exercises cause her legs to hurt   Currently in Pain? Yes   Pain Score 3    Pain Location Knee   Pain Orientation Right;Left   Pain Descriptors / Indicators Aching            [facility] PT Assessment - 02/23/17 0001      6 Minute Walk- Baseline   6 Minute Walk- Baseline yes   02 Sat (%RA) 95 %     6 Minute walk- Post Test   6 Minute Walk Post Test yes   02 Sat (%RA) 85 %     6 minute walk test results    Aerobic Endurance Distance Walked 600                     [facility] Adult PT Treatment/Exercise - 02/23/17 0001      High Level Balance   High Level Balance Activities Marching forwards;Side stepping;Backward walking;Negotitating around obstacles;Negotiating over obstacles;Tandem walking;Direction changes;Head turns   High Level Balance Comments on airex activities, ball tosses, changes of base of support and eyes closed     Knee/Hip Exercises: Aerobic   Nustep Level 5 x 6 minutes     Knee/Hip Exercises: Standing   Hip Flexion 2 sets;10  reps   Hip Flexion Limitations 2#   Hip Abduction 2 sets;10 reps   Abduction Limitations 2#     Knee/Hip Exercises: Seated   Sit to Sand 10 reps;without UE support                  PT Short Term Goals - 02/23/17 1430      PT SHORT TERM GOAL #1   Title Pt will complete 36min walk test and seated MMT with appropriate LTG   Status Achieved           PT Long Term Goals - 02/23/17 1440      PT LONG TERM GOAL #2   Title Pt will ambulate safely around clinic and in the outside enviroment without any LOB with LRAD   Status On-going     PT LONG TERM GOAL #3   Title walk 900 feet with 6 minute walk test   Baseline 600 feet an O2 dropped to 85%   Time 6   Period Weeks               Plan - 02/23/17 1426    Clinical Impression Statement Did well today, again does not like the airex, used two exercises mats on top of each other for the dynamic  surface standing   PT Next Visit Plan continue to work on balance, head turns, dynamic surfaces   Consulted and Agree with Plan of Care Patient      Patient will benefit from skilled therapeutic intervention in order to improve the following deficits and impairments:  Abnormal gait, Difficulty walking, Decreased balance, Decreased strength  Visit Diagnosis: Muscle weakness (generalized)  Other abnormalities of gait and mobility     Problem List Patient Active Problem List   Diagnosis Date Noted  . Left groin pain 12/09/2016  . Acute respiratory failure with hypoxia ([place]) 12/08/2016  . Hypothermia 02/20/2016  . SIRS (systemic inflammatory response syndrome) ([place]) 02/19/2016  . Diverticulosis   . Encephalopathy   . Hyponatremia 02/14/2016  . Acute encephalopathy 02/14/2016  . Elevated lactic acid level 02/14/2016  . Dizziness 02/14/2016  . Abdominal pain 02/14/2016  . Symptomatic bradycardia 02/12/2016  . Leukocytosis 02/12/2016  . Depression 02/12/2016  . Renal insufficiency   . SOB (shortness of breath)   . Bradycardia on ECG 02/11/2016  . Atrial fibrillation with RVR ([place]) 01/01/2016  . PAF (paroxysmal atrial fibrillation) ([place]) 01/01/2016  . Alcohol dependence with withdrawal with complication ([place]) 65/78/4696  . Fall 01/01/2016  . AKI (acute kidney injury) ([place])   . Dehydration   . Exertional dyspnea 09/11/2015  . Memory loss 11/13/2013  . Abnormal involuntary movements(781.0) 11/13/2013  . Pulmonary hypertension ([place]) 11/02/2013  . Generalized weakness 11/02/2013  . Coronary artery disease involving native coronary artery of native heart with angina pectoris ([place]) 11/16/2012  . Mitral regurgitation 11/16/2012  . Renal artery stenosis ([place]) 11/16/2012  . Hyperlipidemia with target LDL less than 70 11/16/2012  . Essential hypertension 11/16/2012    Sumner Boast., PT 02/23/2017, 2:41 PM  [place], [place], [address] Phone: [phone]   Fax:  [phone]  Name: Tammy [name] MRN: [id number] Date of Birth: 11-05-1930

## 2017-02-24 ENCOUNTER — Other Ambulatory Visit: Payer: Self-pay | Admitting: Pharmacist

## 2017-02-24 NOTE — Patient Outreach (Signed)
[facility]) Care Management  02/24/2017  ANNISE BORAN [birth year] [phone]   Called patient to follow up on medication assistance prior to closing her case due to her not following through on application completion. HIPAA identifiers were obtained. Patient said she did not complete the application because she had already been receving some help with her medications. An Extra Help application was completed on the patient's behalf in July.  Patient could not communicate how much help she was/is actually getting with her medications. Patient did not have the red/white/blue Medicare Card scanned into her documents so the Medicare website could not be checked to see if she has been approved.  Talbert Nan at [facility] confirmed the patient has LIS level Four.  This means she has a 15% co-insurance for her medications   Plan: Patient's case will be closed since she now has LIS/Extra Help. Letter will be sent to patient and her provider.

## 2017-02-25 NOTE — Patient Outreach (Addendum)
[facility] for [facility] Care Management has closed this case for Pharmacy.

## 2017-02-28 DIAGNOSIS — M5136 Other intervertebral disc degeneration, lumbar region: Secondary | ICD-10-CM | POA: Diagnosis not present

## 2017-02-28 DIAGNOSIS — M5416 Radiculopathy, lumbar region: Secondary | ICD-10-CM | POA: Diagnosis not present

## 2017-03-01 ENCOUNTER — Encounter: Payer: Self-pay | Admitting: Physical Therapy

## 2017-03-01 ENCOUNTER — Ambulatory Visit: Payer: PPO | Admitting: Physical Therapy

## 2017-03-01 DIAGNOSIS — M6281 Muscle weakness (generalized): Secondary | ICD-10-CM

## 2017-03-01 DIAGNOSIS — R2689 Other abnormalities of gait and mobility: Secondary | ICD-10-CM | POA: Diagnosis not present

## 2017-03-01 NOTE — Therapy (Addendum)
[place], [place], [address] Phone: [phone]   Fax:  [phone]  Physical Therapy Treatment  Patient Details  Name: Tammy [name] MRN: [id number] Date of Birth: 07-Dec-1930 Referring Provider: Lona Kettle MD  Encounter Date: 03/01/2017      PT End of Session - 03/01/17 1518    Visit Number 5   Number of Visits 12   Date for PT Re-Evaluation 03/28/17   PT Start Time 1430   PT Stop Time 1518   PT Time Calculation (min) 48 min      Past Medical History:  Diagnosis Date  . Acid reflux   . Asthma    Asthmatic COPD  . Atrial fibrillation ([place]) 01/01/2016  . Coronary artery disease    Echo 07/27/2011   . Coronary artery stenosis    , High-grade left main  . GERD (gastroesophageal reflux disease)   . Hyperlipidemia   . Hypertension   . IBS (irritable bowel syndrome)   . Psoriasis     Past Surgical History:  Procedure Laterality Date  . CARDIAC SURGERY    . CORONARY ARTERY BYPASS GRAFT  1994   After catheterization showed 90% ostial left main stenosis  . DOPPLER ECHOCARDIOGRAPHY     Study showed mild to moderate MR with moderate TR, mild to moderate pulmonary hypertension with an ejection fraction greater that 55%.   [name] EYE SURGERY    . Nuclear Perfusion  10/2010, 11/15/2012   Showed normal perfusion, No Ischemia or Infarction, Normal EF  . Renal Scan Duplex  04/09/2012   Showing greater than 50% diameter reduction in the celiac artery and SMA.. Right proximal 275 and Mid 152.  [name] ROTATOR CUFF REPAIR     bilateral  . Sleep Study  12/28/2006   AHI during total sleep time (3h 19 minutes) was 1.81/hr and during REM sleep at 17.78/hr. Mild sleep apnea during REM sleep.Oxygen staturated rate during REM and NREM was 93.0%.    There were no vitals filed for this visit.      Subjective Assessment - 03/01/17 1432    Subjective i am here so I guess I am okay   Currently in Pain? Yes   Pain Score 7    Pain Location Leg                         [facility] Adult PT Treatment/Exercise - 03/01/17 0001      Ambulation/Gait   Gait Comments outside level,unlevela nd transition surfaces with and without AD- hesitant on uneven and with transition surfaces     High Level Balance   High Level Balance Activities Figure 8 turns;Direction changes;Tandem walking;Head turns  ball toss, cones and foam rolls     Knee/Hip Exercises: Aerobic   Nustep Level 5 x 6 minutes     Knee/Hip Exercises: Standing   Other Standing Knee Exercises hip 3 way on airex with red tband 15 times BIL   Other Standing Knee Exercises HHA side stepping, marching fwd and back ward 20 feet 2 times each                  PT Short Term Goals - 02/23/17 1430      PT SHORT TERM GOAL #1   Title Pt will complete 54mn walk test and seated MMT with appropriate LTG   Status Achieved           PT  Long Term Goals - 02/23/17 1440      PT LONG TERM GOAL #2   Title Pt will ambulate safely around clinic and in the outside enviroment without any LOB with LRAD   Status On-going     PT LONG TERM GOAL #3   Title walk 900 feet with 6 minute walk test   Baseline 600 feet an O2 dropped to 85%   Time 6   Period Weeks               Plan - 03/01/17 1518    Clinical Impression Statement pt fearful on uneven terrain and dynamic surfaces, tends to move fast to aviod falls/LOB making her at higher risk for falls. SOB requiring rest breaks   PT Next Visit Plan continue to work on balance, head turns, dynamic surfaces      Patient will benefit from skilled therapeutic intervention in order to improve the following deficits and impairments:     Visit Diagnosis: Muscle weakness (generalized)     Problem List Patient Active Problem List   Diagnosis Date Noted  . Left groin pain 12/09/2016  . Acute respiratory failure with hypoxia ([place]) 12/08/2016  . Hypothermia 02/20/2016  . SIRS  (systemic inflammatory response syndrome) ([place]) 02/19/2016  . Diverticulosis   . Encephalopathy   . Hyponatremia 02/14/2016  . Acute encephalopathy 02/14/2016  . Elevated lactic acid level 02/14/2016  . Dizziness 02/14/2016  . Abdominal pain 02/14/2016  . Symptomatic bradycardia 02/12/2016  . Leukocytosis 02/12/2016  . Depression 02/12/2016  . Renal insufficiency   . SOB (shortness of breath)   . Bradycardia on ECG 02/11/2016  . Atrial fibrillation with RVR ([place]) 01/01/2016  . PAF (paroxysmal atrial fibrillation) ([place]) 01/01/2016  . Alcohol dependence with withdrawal with complication ([place]) 01/74/9449  . Fall 01/01/2016  . AKI (acute kidney injury) ([place])   . Dehydration   . Exertional dyspnea 09/11/2015  . Memory loss 11/13/2013  . Abnormal involuntary movements(781.0) 11/13/2013  . Pulmonary hypertension ([place]) 11/02/2013  . Generalized weakness 11/02/2013  . Coronary artery disease involving native coronary artery of native heart with angina pectoris ([place]) 11/16/2012  . Mitral regurgitation 11/16/2012  . Renal artery stenosis ([place]) 11/16/2012  . Hyperlipidemia with target LDL less than 70 11/16/2012  . Essential hypertension 11/16/2012  PHYSICAL THERAPY DISCHARGE SUMMARY   Plan: Patient agrees to discharge.  Patient goals were not met. Patient is being discharged due to not returning since the last visit.  ?????       Servando Kyllonen,ANGIE PTA 03/01/2017, 3:21 PM  [place], [place], [address] Phone: [phone]   Fax:  [phone]  Name: Tammy [name] MRN: [id number] Date of Birth: 02-28-1931

## 2017-03-02 ENCOUNTER — Encounter: Payer: Self-pay | Admitting: Cardiovascular Disease

## 2017-03-02 ENCOUNTER — Ambulatory Visit (INDEPENDENT_AMBULATORY_CARE_PROVIDER_SITE_OTHER): Payer: PPO | Admitting: Cardiovascular Disease

## 2017-03-02 VITALS — BP 191/83 | HR 87 | Ht 60.0 in | Wt 147.2 lb

## 2017-03-02 DIAGNOSIS — I10 Essential (primary) hypertension: Secondary | ICD-10-CM | POA: Diagnosis not present

## 2017-03-02 DIAGNOSIS — K219 Gastro-esophageal reflux disease without esophagitis: Secondary | ICD-10-CM | POA: Diagnosis not present

## 2017-03-02 DIAGNOSIS — I480 Paroxysmal atrial fibrillation: Secondary | ICD-10-CM | POA: Diagnosis not present

## 2017-03-02 DIAGNOSIS — R413 Other amnesia: Secondary | ICD-10-CM | POA: Diagnosis not present

## 2017-03-02 DIAGNOSIS — I25119 Atherosclerotic heart disease of native coronary artery with unspecified angina pectoris: Secondary | ICD-10-CM

## 2017-03-02 DIAGNOSIS — E785 Hyperlipidemia, unspecified: Secondary | ICD-10-CM

## 2017-03-02 MED ORDER — AMLODIPINE BESYLATE 5 MG PO TABS: 5.0000 mg | ORAL_TABLET | Freq: Every day | ORAL | 3 refills | Status: DC

## 2017-03-02 NOTE — Progress Notes (Signed)
Patient ID: Tammy [name], female   DOB: October 18, 1930, 83 y.o.   MRN: [id number]     Primary M.D.: Gus Height  HPI: Tammy [name] is a 83 y.o. female who presents to the office today for a 7 month followup cardiology evaluation.   Tammy [name] has established CAD andunderwent CABG surgery in 1994 after she was found to have high-grade left main stenosis. A nuclear perfusion study in June 2011 showed normal perfusion. An echo Doppler study in March 2013 showed mild-to-moderate mitral regurgitation, moderate tricuspid regurgitation, mild-to-moderate pulmonary hypertension with estimated RV systolic pressure 45 mm, mild to moderate LA dilatation, aortic sclerosis/calcification, and an ejection fraction greater than 55%. She has documented right renal artery stenosis on renal duplex scanning with the last scan in November 2013 suggesting a equal or greater than 60% diameter reduction by velocity in the right renal artery. The kidneys are otherwise normal in size, symmetrical in shape and had normal cortex and medulla. She does have a history of  hypertension and hyperlipidemia. In the past she also has had some issues with cellulitis of her lower extremity.   A two-year followup nuclear perfusion study on 11/16/2011; no significant EKG changes were demonstrated. The study was low risk and did not show any area of scar or infarction. This is unchanged from 2 years previously.  She has had issues with lower extremity edema and this improved with reduction of her amlodipine dose to 5 mg from 10 mg. A follow-up echo Doppler study continued to show normal systolic function.  There was mild left atrial dilatation and mild pulmonary hypertension with a PA pressure slightly improved from previously at 38 mm.  She did not have significant valvular abnormality.  A follow-up renal duplex study was unchanged from her prior study of one year previously and showed equal to less than 6% diameter reduction in the right renal  artery.  She had normal patency to the left renal artery.  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   She underwent a follow-up nuclear perfusion study in July 2016.  This was normal without scar or ischemia.  Ejection fraction was 74% and she had normal wall motion.  She did not develop any ECG changes.  She was hospitalized September and in October 2017.  She had episodes of lightheadedness upon standing, nausea, lower abdominal pain and confusion.  He was found to have renal failure acutely and was treated with intravenous fluids.  He had increased to 1.74.  He has a history of atrial fibrillation but was felt not to be a candidate for anticoagulation due to fall risk.  Her beta blocker had been discontinued due to bradycardia.  When she was admitted to [facility] from October 5 through 02/22/2016 she was treated with Cardizem.  She had an unremarkable CT of her head and neck.  She broke her wrist before Christmas.  She has been followed by Dr. Gus Height, for primary care.   Since I last saw her, apparently, she had called in and was complaining of discomfort which she felt was due to the diltiazem and had restarted back on metoprolol, which she had taken in the past.  There are telephone conversations regarding that she felt better and the leg discomfort resolved.  She now walks some time with a cane.  She denies any episodes of chest pressure.  She is unaware of any recurrent tachycardic episodes or slow heart pulse.  She denies dizziness.  She  presents for reevaluation.  Past Medical History:  Diagnosis Date  . Acid reflux   . Asthma    Asthmatic COPD  . Atrial fibrillation ([place]) 01/01/2016  . Coronary artery disease    Echo 07/27/2011   . Coronary artery stenosis    , High-grade left main  . GERD (gastroesophageal reflux disease)   . Hyperlipidemia   . Hypertension   . IBS (irritable bowel syndrome)   . Psoriasis     Past Surgical History:  Procedure  Laterality Date  . CARDIAC SURGERY    . CORONARY ARTERY BYPASS GRAFT  1994   After catheterization showed 90% ostial left main stenosis  . DOPPLER ECHOCARDIOGRAPHY     Study showed mild to moderate MR with moderate TR, mild to moderate pulmonary hypertension with an ejection fraction greater that 55%.   [name] EYE SURGERY    . Nuclear Perfusion  10/2010, 11/15/2012   Showed normal perfusion, No Ischemia or Infarction, Normal EF  . Renal Scan Duplex  04/09/2012   Showing greater than 50% diameter reduction in the celiac artery and SMA.. Right proximal 275 and Mid 152.  [name] ROTATOR CUFF REPAIR     bilateral  . Sleep Study  12/28/2006   AHI during total sleep time (3h 19 minutes) was 1.81/hr and during REM sleep at 17.78/hr. Mild sleep apnea during REM sleep.Oxygen staturated rate during REM and NREM was 93.0%.    Allergies  Allergen Reactions  . Penicillins Hives    Has patient had a PCN reaction causing immediate rash, facial/tongue/throat swelling, SOB or lightheadedness with hypotension: No Has patient had a PCN reaction causing severe rash involving mucus membranes or skin necrosis: Yes Has patient had a PCN reaction that required hospitalization No Has patient had a PCN reaction occurring within the last 10 years: Yes took again last year 2016 If all of the above answers are "NO", then may proceed with Cephalosporin use.   . Naprosyn [Naproxen]   . Lactose Intolerance (Gi) Other (See Comments)    Unknown- [facility] documented    Current Outpatient Prescriptions  Medication Sig Dispense Refill  . acetaminophen (TYLENOL) 325 MG tablet Take 650 mg by mouth 2 (two) times daily.    [name] albuterol (VENTOLIN HFA) 108 (90 Base) MCG/ACT inhaler Inhale 2 puffs into the lungs every 4 (four) hours as needed for wheezing or shortness of breath. 1 Inhaler 1  . aspirin 81 MG tablet Take 81 mg by mouth daily.     [name] atorvastatin (LIPITOR) 10 MG tablet Take 1 tablet (10 mg total) by  mouth at bedtime.    . donepezil (ARICEPT) 10 MG tablet Take 1 tablet (10 mg total) by mouth daily. 90 tablet 1  . DULoxetine (CYMBALTA) 60 MG capsule Take 60 mg by mouth daily. Reported on 08/05/2015  2  . fluticasone (FLONASE) 50 MCG/ACT nasal spray USE 1 TO 2 SPRAY(S) IN EACH NOSTRIL ONCE DAILY FOR  STUFFY  NOSE  OR  DRAINAGE 16 g 1  . fluticasone furoate-vilanterol (BREO ELLIPTA) 200-25 MCG/INH AEPB Inhale 1 puff into the lungs daily. 60 each 3  . folic acid (FOLVITE) 1 MG tablet Take 1 tablet (1 mg total) by mouth daily.    . metoprolol tartrate (LOPRESSOR) 50 MG tablet Take 1 tablet (50 mg total) by mouth 2 (two) times daily. 180 tablet 3  . metoprolol tartrate (LOPRESSOR) 50 MG tablet Take 50 mg by mouth 2 (two) times daily. Pt reports she is taking  this medication    . Multiple Vitamins-Minerals (MULTIVITAMIN WITH MINERALS) tablet Take 1 tablet by mouth daily.    . Omega-3 Fatty Acids (FISH OIL) 1000 MG CAPS Take 1 capsule by mouth daily.    [name] omeprazole (PRILOSEC) 20 MG capsule Take 20 mg by mouth 2 (two) times daily.     . Probiotic Product (PROBIOTIC DAILY) CAPS Take 1 capsule by mouth daily.    . ranitidine (ZANTAC) 300 MG tablet TAKE ONE TABLET BY MOUTH AT BEDTIME (Patient taking differently: TAKE 300MG BY MOUTH AT BEDTIME) 90 tablet 1  . amLODipine (NORVASC) 5 MG tablet Take 1 tablet (5 mg total) by mouth daily. 90 tablet 3   No current facility-administered medications for this visit.     Socially , she is divorced.  She denies any recent tobacco use. She has not been able to exercise regularly.  ROS General: Negative; No fevers, chills, or night sweats; was in for fatigue. HEENT: Negative; No changes in vision or hearing, sinus congestion, difficulty swallowing Pulmonary: Negative; No cough, wheezing, shortness of breath, hemoptysis Cardiovascular: See history of present illness;  GI: Positive for diverticulitis, and frequent diarrhea;  GU: Positive for renal artery stenosis;  No dysuria, hematuria, or difficulty voiding Musculoskeletal: Right wrist fracture. Hematologic/Oncology: Negative; no easy bruising, bleeding Endocrine: Negative; no heat/cold intolerance; no diabetes Neuro: Positive for memory loss, now on Aricept Skin: Negative; No rashes or skin lesions Psychiatric: Negative; No behavioral problems, depression Sleep: Negative; No snoring, daytime sleepiness, hypersomnolence, bruxism, restless legs, hypnogognic hallucinations, no cataplexy Other comprehensive 14 point system review is negative.   PE BP (!) 191/83   Pulse 87   Ht 5' (1.524 m)   Wt 147 lb 3.2 oz (66.8 kg)   BMI 28.75 kg/m    Repeat blood pressure was 164/80.  Wt Readings from Last 3 Encounters:  03/02/17 147 lb 3.2 oz (66.8 kg)  12/09/16 145 lb 6.4 oz (66 kg)  08/02/16 138 lb (62.6 kg)   General: Alert, oriented, no distress.  Skin: normal turgor, no rashes, warm and dry HEENT: Normocephalic, atraumatic. Pupils equal round and reactive to light; sclera anicteric; extraocular muscles intact;  Nose without nasal septal hypertrophy Mouth/Parynx benign; Mallinpatti scale 2 Neck: No JVD, no carotid bruits; normal carotid upstroke Lungs: clear to ausculatation and percussion; no wheezing or rales Chest wall: without tenderness to palpitation Heart: PMI not displaced, RRR, s1 s2 normal, 1/6 systolic murmur, no diastolic murmur, no rubs, gallops, thrills, or heaves Abdomen: soft, nontender; no hepatosplenomehaly, BS+; abdominal aorta nontender and not dilated by palpation. Back: no CVA tenderness Pulses 2+ Musculoskeletal: full range of motion, normal strength, no joint deformities Extremities: no clubbing cyanosis or edema, Homan's sign negative  Neurologic: grossly nonfocal; Cranial nerves grossly wnl Psychologic: Normal mood and affect   ECG (independently read by me): Normal sinus rhythm at 87 bpm.  Nonspecific ST changes.  QTc interval 450 ms.  March 2018 ECG  (independently read by me): Normal sinus rhythm at 78 bpm.  No ST segment changes.  Normal intervals.  April 2017 ECG (independently read by me): Normal sinus rhythm at 68 bpm.  Normal intervals.  No ST segment changes.  July 2016 ECG (independently read by me): Normal sinus rhythm at 70 bpm.  Nonspecific ST changes.  PR interval 178 ms.  QTc interval 429 ms.  November 02, 2013 ECG (independently read by me): Sinus bradycardia 55 beats per minute.  Nonspecific ST-T changes.  QTc interval 430 ms.  Borderline  first degree AV block with a PR interval of 202 ms.  Prior September 2014 ECG: Normal sinus rhythm at 68 beats per minute. One isolated PVC. Nonspecific ST changes.  LABS:  BMP Latest Ref Rng & Units 12/09/2016 12/08/2016 02/21/2016  Glucose 65 - 99 mg/dL 133(H) 96 108(H)  BUN 6 - 20 mg/dL _0 Creatinine 0.44 - 1.00 mg/dL 0.99 0.88 0.84  Sodium 135 - 145 mmol/L 137 136 139  Potassium 3.5 - 5.1 mmol/L 3.7 4.4 3.6  Chloride 101 - 111 mmol/L 107 103 111  CO2 22 - 32 mmol/L _1 Calcium 8.9 - 10.3 mg/dL 8.3(L) 8.7(L) 8.7(L)   Hepatic Function Latest Ref Rng & Units 12/08/2016 02/21/2016 02/20/2016  Total Protein 6.5 - 8.1 g/dL 6.5 5.7(L) 6.1(L)  Albumin 3.5 - 5.0 g/dL 3.3(L) 2.9(L) 3.0(L)  AST 15 - 41 U/L 22 24 35  ALT 14 - 54 U/L 18 33 45  Alk Phosphatase 38 - 126 U/L 78 75 69  Total Bilirubin 0.3 - 1.2 mg/dL 0.4 0.4 0.6  Bilirubin, Direct 0.1 - 0.5 mg/dL - - -   CBC Latest Ref Rng & Units 12/09/2016 12/08/2016 02/21/2016  WBC 4.0 - 10.5 K/uL 10.9(H) 13.5(H) 9.7  Hemoglobin 12.0 - 15.0 g/dL 11.3(L) 12.3 10.4(L)  Hematocrit 36.0 - 46.0 % 36.4 39.1 33.9(L)  Platelets 150 - 400 K/uL 260 317 275   Lab Results  Component Value Date   MCV 82.4 12/09/2016   MCV 82.5 12/08/2016   MCV 91.6 02/21/2016   Lab Results  Component Value Date   TSH 1.963 02/20/2016   Lab Results  Component Value Date   HGBA1C  07/12/2009    5.3 (NOTE) The ADA recommends the following therapeutic goal  for glycemic control related to Hgb A1c measurement: Goal of therapy: <6.5 Hgb A1c  Reference: American Diabetes Association: Clinical Practice Recommendations 2010, Diabetes Care, 2010, 33: (Suppl  1).   Lipid Panel     Component Value Date/Time   CHOL 118 01/03/2016 0304   CHOL 128 11/09/2012 0805   TRIG 106 01/03/2016 0304   TRIG 84 11/09/2012 0805   HDL 43 01/03/2016 0304   HDL 50 11/09/2012 0805   CHOLHDL 2.7 01/03/2016 0304   VLDL 21 01/03/2016 0304   LDLCALC 54 01/03/2016 0304   LDLCALC 61 11/09/2012 0805    RADIOLOGY: No results found.  IMPRESSION:  1. Essential hypertension   2. Paroxysmal atrial fibrillation (H[facility])   3. Coronary artery disease involving native coronary artery of native heart with angina pectoris ([place])   4. Hyperlipidemia with target LDL less than 70   5. Gastroesophageal reflux disease without esophagitis   6. Memory loss     ASSESSMENT AND PLAN: Ms. Stankiewicz is an 83 year old white female who Underwent CABG revascularization surgery in 1994. She has had issues with significant fatigue and mild shortness of breath with activity.  An echo Doppler study in June 2015 was not significantly change from previously.  PA pressure is still elevated, but improved at 38 mm compared to 45 mmHg in 2013.  A nuclear perfusion study in July 2016 continued to show normal perfusion without scar or ischemia.  Her last echo Doppler study in May 2017 showed normal LV size with mild focal basal septal hypertrophy.  EF was 60-65%.  She had normal RV size and systolic function and there were no significant valvular abnormalities.  On exam today she is hypertensive despite taking metoprolol 50 mg twice a.  Remotely, she had been on diltiazem but apparently had leg issues leading to resuming metoprolol dose.  She is not bradycardic.  Presently.  Elevated blood pressure.  I will add amlodipine 5 mg to her regimen.  There is no wheezing.  She has a history of mild asthma for which she is  on breo-ellipta and when necessary IV when necessary all.  Her GERD is controlled with omeprazole.  She is now on Aricept for memory issues.  She also is on Cymbalta.  QTc interval is normal.  She continues to be on atorvastatin 10 mg with her CAD.  LDL last year was 54.  She will follow-up with her primary physician, Dr. Harrington Challenger.  I will see her in 6 months for cardiology reevaluation.  [name], MD, [facility]  03/02/2017 6:32 PM

## 2017-03-02 NOTE — Patient Instructions (Signed)
Medication Instructions:  START amlodipine (Norvasc) 5 mg (1 tablet) daily  Follow-Up: Your physician wants you to follow-up in: 6 months with Dr. Claiborne Billings. You will receive a reminder letter in the mail two months in advance. If you don't receive a letter, please call our office to schedule the follow-up appointment.   Any Other Special Instructions Will Be Listed Below (If Applicable).     If you need a refill on your cardiac medications before your next appointment, please call your pharmacy.

## 2017-03-08 ENCOUNTER — Other Ambulatory Visit: Payer: Self-pay | Admitting: *Deleted

## 2017-03-08 NOTE — Patient Outreach (Signed)
[facility]) Care Management   03/08/2017  Tammy [name] 1930-12-08 [id number]  Tammy [name] is an 83 y.o. female  Subjective:  FALLS: Pt reports no falls however weakness continues off and on but no falls. Pt states this is a history for her but she continue to manage this well and uses her assisted devices. Pt is well aware of the risk involved.  HTN: Pt reports her BP was elevated on her provider's visit this month and a new medications was added Amlodipine. Pt states she feels better and believed her BP has reduced from what is was on her office visit.  Pt states she has a BP home device for ongoing monitoring. MEDICATION: Pt reports she has started the new medication with no side affects and will continue to take as recommended.    Objective:   Review of Systems  Constitutional: Negative.   HENT: Negative.   Eyes: Negative.   Respiratory: Negative.   Cardiovascular: Positive for chest pain.  Gastrointestinal: Negative.   Genitourinary: Negative.   Musculoskeletal: Negative.   Skin: Positive for itching.       History of general itching  Neurological: Negative.   Endo/Heme/Allergies: Negative.   Psychiatric/Behavioral: Negative.     Physical Exam  Constitutional: She is oriented to person, place, and time. She appears well-developed and well-nourished.  HENT:  Right Ear: External ear normal.  Left Ear: External ear normal.  Eyes: EOM are normal.  Neck: Normal range of motion.  Respiratory: Breath sounds normal.  GI: Soft. Bowel sounds are normal.  Musculoskeletal: Normal range of motion.  Neurological: She is alert and oriented to person, place, and time.  Skin: Skin is warm and dry.  Psychiatric: She has a normal mood and affect. Her behavior is normal. Judgment and thought content normal.    Encounter Medications:   Outpatient Encounter Prescriptions as of 03/08/2017  Medication Sig Note  . acetaminophen (TYLENOL) 325 MG tablet Take 650 mg by  mouth 2 (two) times daily.   [name] albuterol (VENTOLIN HFA) 108 (90 Base) MCG/ACT inhaler Inhale 2 puffs into the lungs every 4 (four) hours as needed for wheezing or shortness of breath.   [name] amLODipine (NORVASC) 5 MG tablet Take 1 tablet (5 mg total) by mouth daily.   [name] aspirin 81 MG tablet Take 81 mg by mouth daily.    [name] atorvastatin (LIPITOR) 10 MG tablet Take 1 tablet (10 mg total) by mouth at bedtime.   . donepezil (ARICEPT) 10 MG tablet Take 1 tablet (10 mg total) by mouth daily.   . DULoxetine (CYMBALTA) 60 MG capsule Take 60 mg by mouth daily. Reported on 08/05/2015 12/13/2016: Patient reports she is taking twice daily/am & PM  . fluticasone (FLONASE) 50 MCG/ACT nasal spray USE 1 TO 2 SPRAY(S) IN EACH NOSTRIL ONCE DAILY FOR  STUFFY  NOSE  OR  DRAINAGE   . fluticasone furoate-vilanterol (BREO ELLIPTA) 200-25 MCG/INH AEPB Inhale 1 puff into the lungs daily.   . folic acid (FOLVITE) 1 MG tablet Take 1 tablet (1 mg total) by mouth daily.   . metoprolol tartrate (LOPRESSOR) 50 MG tablet Take 50 mg by mouth 2 (two) times daily. Pt reports she is taking this medication   . Multiple Vitamins-Minerals (MULTIVITAMIN WITH MINERALS) tablet Take 1 tablet by mouth daily.   . Omega-3 Fatty Acids (FISH OIL) 1000 MG CAPS Take 1 capsule by mouth daily.   [name] omeprazole (PRILOSEC) 20 MG capsule Take 20 mg by mouth 2 (  two) times daily.    . Probiotic Product (PROBIOTIC DAILY) CAPS Take 1 capsule by mouth daily.   . ranitidine (ZANTAC) 300 MG tablet TAKE ONE TABLET BY MOUTH AT BEDTIME (Patient taking differently: TAKE 300MG  BY MOUTH AT BEDTIME)   . metoprolol tartrate (LOPRESSOR) 50 MG tablet Take 1 tablet (50 mg total) by mouth 2 (two) times daily.    No facility-administered encounter medications on file as of 03/08/2017.     Functional Status:   In your present state of health, do you have any difficulty performing the following activities: 01/04/2017 12/23/2016  Hearing? - Y  Vision? - N  Difficulty  concentrating or making decisions? - Y  Walking or climbing stairs? - Y  Dressing or bathing? - Y  Doing errands, shopping? N (No Data)  Comment - Pt states she continue to drives  Preparing Food and eating ? - N  Using the Toilet? - N  In the past six months, have you accidently leaked urine? - Y  Comment - wears incontinent pads  Do you have problems with loss of bowel control? - Y  Managing your Medications? - Y  Comment - daughter assist with medication pillbox  Managing your Finances? - N  Housekeeping or managing your Housekeeping? - Y  Some recent data might be hidden    Fall/Depression Screening:    Fall Risk  12/23/2016 12/13/2016  Falls in the past year? Yes Yes  Number falls in past yr: 1 1  Injury with Fall? No No  Risk for fall due to : Impaired balance/gait Impaired balance/gait;History of fall(s)  Follow up Falls prevention discussed -   PHQ 2/9 Scores 12/23/2016 12/13/2016  PHQ - 2 Score 0 1    BP (!) 152/60 (BP Location: Left Arm, Patient Position: Sitting, Cuff Size: Normal)   Pulse 67   Resp 20   Ht 1.524 m (5')   Wt 147 lb (66.7 kg)   SpO2 97%   BMI 28.71 kg/m  Assessment:   Ongoing case management related to fall history Elevated BP related to HTN Adherence related to new medications (Amlodipine)   Plan:  Will verified pt has not had any falls and continue to remind pt to use her assisted devices to prevent falls. Also discussed the risk involved if pt does not use her assisted devices such as her rolling walker and/or cane when ambulating both inside and outside the home.  Will discussed HTN and the importance of monitoring her BP with her home device. Will request pt to take her BP today to compare to RN manual readings for ongoing adherence and to report any acute evaluated readings. Will educate on HTN and requested pt to complete a BP readings from her home device with a read of 158/77 on her her left wrist comparable to RN read today. Note office visit  10/17 was 191/83 and today's reading was 152/60 as pt remains asymptomatic. Will discuss the new medications that continue to assist pt with her elevated BP.  Will encouraged pt to take her BP more regularly due to the change in her medications. Verified pt is not having any side effects from the new medication. Plan of care discussed along with all goals that will be re-evaluated next month with a home visit.  [name], RN Care Management Coordinator [place] Office [phone]

## 2017-03-10 ENCOUNTER — Ambulatory Visit: Payer: PPO | Admitting: Physical Therapy

## 2017-03-13 DIAGNOSIS — J9601 Acute respiratory failure with hypoxia: Secondary | ICD-10-CM | POA: Diagnosis not present

## 2017-03-13 DIAGNOSIS — R001 Bradycardia, unspecified: Secondary | ICD-10-CM | POA: Diagnosis not present

## 2017-03-18 DIAGNOSIS — I10 Essential (primary) hypertension: Secondary | ICD-10-CM | POA: Diagnosis not present

## 2017-03-18 DIAGNOSIS — F0390 Unspecified dementia without behavioral disturbance: Secondary | ICD-10-CM | POA: Diagnosis not present

## 2017-03-18 DIAGNOSIS — Z23 Encounter for immunization: Secondary | ICD-10-CM | POA: Diagnosis not present

## 2017-04-01 ENCOUNTER — Other Ambulatory Visit: Payer: Self-pay | Admitting: Allergy and Immunology

## 2017-04-01 DIAGNOSIS — R799 Abnormal finding of blood chemistry, unspecified: Secondary | ICD-10-CM | POA: Diagnosis not present

## 2017-04-01 DIAGNOSIS — D72829 Elevated white blood cell count, unspecified: Secondary | ICD-10-CM | POA: Diagnosis not present

## 2017-04-05 ENCOUNTER — Other Ambulatory Visit: Payer: Self-pay | Admitting: *Deleted

## 2017-04-05 NOTE — Patient Outreach (Signed)
[facility]) Care Management  04/05/2017  Tammy [name] Aug 05, 1930 [id number]   MISSED HOME VISIT RN at pt's home today with several knocks on the door and no answer. RN left business card on the door and attempted to contact pt while on the premise. Pt answered the phone but indicated she had tried to contact RN to cancelled today's appointment but was not successful. Pt states she is not feeling well but did not wish for RN to assessment while at the home today. Pt aware to seek medical attention if her condition is acute or symptoms get worse again decline RN offer to assess further. Pt requested to call later and rescheduled today's missed appointment and was very apologetic . Pt did not wish to review her plan of care today due to "not feeling well" but to review on the next home visit. RN will await pt to response over the next week due to the upcoming Holidays with plans to reschedule the next home visit. Will follow up accordingly.  [name], RN Care Management Coordinator [place] Office [phone]

## 2017-04-13 DIAGNOSIS — R001 Bradycardia, unspecified: Secondary | ICD-10-CM | POA: Diagnosis not present

## 2017-04-13 DIAGNOSIS — J9601 Acute respiratory failure with hypoxia: Secondary | ICD-10-CM | POA: Diagnosis not present

## 2017-04-15 ENCOUNTER — Other Ambulatory Visit: Payer: Self-pay | Admitting: *Deleted

## 2017-04-15 NOTE — Patient Outreach (Signed)
[facility]) Care Management  04/15/2017  Tammy [name] Apr 16, 1931 [id number]   Telephone Assessment  RN received a follow up call from pt who missed a home visit appointment last week. RN verified identifiers and completed a telephone assessment due to the end of the month and rescheduled the next home visit as requested after Christmas with this pt. Pt states she had a small fall on her deck racking her leaves with no injuries. RN stress the importance of possible getting neighbors or assistance with this task to avoid possible falls in the future. Also reiterated on emergency alert services as pt continues to decline to option at this time. RN stress the importance of having her cell or hone nearby to call for assistance if there is an emergency (pt with clear understanding). Pt also reports her BP remains with good readings on there provider visits. RN continue to stress the importance of monitoring her BP in her home with her home device. Will rescheduled the next home visit according to pt's scheduled and follow up accordingly. Plan of care and goals update today. Will re-evaluate pt's plan of care next month. Will inquired on any needs at that time related to community resources and/or any other services needed via [facility] services. Will obtain BP and continue to reiterated on managing this condition independently with her home device. No other inquiries or request at this time.   [name], RN Care Management Coordinator [place] Office [phone]

## 2017-04-18 ENCOUNTER — Encounter: Payer: Self-pay | Admitting: *Deleted

## 2017-04-19 ENCOUNTER — Ambulatory Visit: Payer: PPO | Admitting: Allergy and Immunology

## 2017-04-19 DIAGNOSIS — J309 Allergic rhinitis, unspecified: Secondary | ICD-10-CM

## 2017-04-20 DIAGNOSIS — M25531 Pain in right wrist: Secondary | ICD-10-CM | POA: Diagnosis not present

## 2017-04-20 DIAGNOSIS — M25532 Pain in left wrist: Secondary | ICD-10-CM | POA: Diagnosis not present

## 2017-04-20 DIAGNOSIS — M25562 Pain in left knee: Secondary | ICD-10-CM | POA: Diagnosis not present

## 2017-05-12 ENCOUNTER — Other Ambulatory Visit: Payer: Self-pay | Admitting: *Deleted

## 2017-05-12 NOTE — Patient Outreach (Signed)
[facility]) Care Management   05/12/2017  Tammy [name] April 09, 1931 [id number]  Tammy [name] is an 83 y.o. female  Subjective:  WEAKNESS: Pt reports she is stronger and doing well with no major issues.  Reports a cut to her right first finger that she taped and reports holding presser to to control the bleeding but no additional break through bleeding has occurred. States no pain or swelling to the site and refuses to seek medical attention as recommended by the family.  BP: Pt continues to take her BP on her home device with no abnormal readings since the elevated reading in Oct. Pt continue to be aware of symptoms if encountered and what to do if acute.  Pt aware of her upcoming appointments with major issues to address at this time. Pt express how grateful she was for the RN visiting over the last several month and feels she is able to manager her ongoing medical issues independently.  Objective:   Review of Systems  All other systems reviewed and are negative.   Physical Exam  Constitutional: She is oriented to person, place, and time. She appears well-developed and well-nourished.  HENT:  Right Ear: External ear normal.  Left Ear: External ear normal.  Eyes: EOM are normal.  Neck: Normal range of motion.  Cardiovascular: Normal heart sounds.  Respiratory: Effort normal and breath sounds normal.  GI: Soft. Bowel sounds are normal.  Musculoskeletal: Normal range of motion.  Neurological: She is alert and oriented to person, place, and time.  Skin: Skin is warm and dry.  Psychiatric: She has a normal mood and affect. Her behavior is normal. Judgment and thought content normal.    Encounter Medications:   Outpatient Encounter Medications as of 05/12/2017  Medication Sig Note  . acetaminophen (TYLENOL) 325 MG tablet Take 650 mg by mouth 2 (two) times daily.   [name] albuterol (VENTOLIN HFA) 108 (90 Base) MCG/ACT inhaler Inhale 2 puffs into the lungs every 4 (four)  hours as needed for wheezing or shortness of breath.   [name] aspirin 81 MG tablet Take 81 mg by mouth daily.    [name] atorvastatin (LIPITOR) 10 MG tablet Take 1 tablet (10 mg total) by mouth at bedtime.   . benzonatate (TESSALON) 100 MG capsule Take 100 mg by mouth 3 (three) times daily as needed for cough.   . donepezil (ARICEPT) 10 MG tablet Take 1 tablet (10 mg total) by mouth daily.   . DULoxetine (CYMBALTA) 60 MG capsule Take 60 mg by mouth daily. Reported on 08/05/2015 12/13/2016: Patient reports she is taking twice daily/am & PM  . fluticasone (FLONASE) 50 MCG/ACT nasal spray USE 1 TO 2 SPRAY(S) IN EACH NOSTRIL ONCE DAILY FOR  STUFFY  NOSE  OR  DRAINAGE   . fluticasone furoate-vilanterol (BREO ELLIPTA) 200-25 MCG/INH AEPB Inhale 1 puff into the lungs daily.   . folic acid (FOLVITE) 1 MG tablet Take 1 tablet (1 mg total) by mouth daily.   . irbesartan (AVAPRO) 150 MG tablet Take 150 mg by mouth daily.   . metoprolol tartrate (LOPRESSOR) 50 MG tablet Take 50 mg by mouth 2 (two) times daily. Pt reports she is taking this medication   . Multiple Vitamins-Minerals (MULTIVITAMIN WITH MINERALS) tablet Take 1 tablet by mouth daily.   . Omega-3 Fatty Acids (FISH OIL) 1000 MG CAPS Take 1 capsule by mouth daily.   [name] omeprazole (PRILOSEC) 20 MG capsule Take 20 mg by mouth 2 (two) times daily.    [name]  Probiotic Product (PROBIOTIC DAILY) CAPS Take 1 capsule by mouth daily.   . ranitidine (ZANTAC) 300 MG tablet TAKE ONE TABLET BY MOUTH AT BEDTIME (Patient taking differently: TAKE 300MG BY MOUTH AT BEDTIME)   . amLODipine (NORVASC) 5 MG tablet Take 1 tablet (5 mg total) by mouth daily. (Patient not taking: Reported on 05/12/2017)   . fluticasone (FLONASE) 50 MCG/ACT nasal spray USE 1 TO 2 SPRAY(S) IN EACH NOSTRIL ONCE DAILY FOR  STUFFY  NOSE  OR  DRAINAGE  *PATIENT  NEEDS  OFFICE  VISIT* (Patient not taking: Reported on 05/12/2017)   . metoprolol tartrate (LOPRESSOR) 50 MG tablet Take 1 tablet (50 mg total) by mouth 2  (two) times daily.    No facility-administered encounter medications on file as of 05/12/2017.     Functional Status:   In your present state of health, do you have any difficulty performing the following activities: 01/04/2017 12/23/2016  Hearing? - Y  Vision? - N  Difficulty concentrating or making decisions? - Y  Walking or climbing stairs? - Y  Dressing or bathing? - Y  Doing errands, shopping? N (No Data)  Comment - Pt states she continue to drives  Preparing Food and eating ? - N  Using the Toilet? - N  In the past six months, have you accidently leaked urine? - Y  Comment - wears incontinent pads  Do you have problems with loss of bowel control? - Y  Managing your Medications? - Y  Comment - daughter assist with medication pillbox  Managing your Finances? - N  Housekeeping or managing your Housekeeping? - Y  Some recent data might be hidden    Fall/Depression Screening:    Fall Risk  12/23/2016 12/13/2016  Falls in the past year? Yes Yes  Number falls in past yr: 1 1  Injury with Fall? No No  Risk for fall due to : Impaired balance/gait Impaired balance/gait;History of fall(s)  Follow up Falls prevention discussed -   PHQ 2/9 Scores 12/23/2016 12/13/2016  PHQ - 2 Score 0 1   BP (!) 118/52 (BP Location: Left Arm, Patient Position: Sitting, Cuff Size: Normal)   Pulse 65   Resp 20   Ht 1.524 m (5')   Wt 140 lb (63.5 kg)   SpO2 93%   BMI 27.34 kg/m  Assessment:   Follow up on safety/weakness related to fall history Follow up HTN related recent elevated reading   Plan:  Will verify pt continue to manage her ongoing medical issues with no reported problems. No acute issues to address as pt continue to monitoring her BP with home device and aware when to report acute issues to her providers. Will continue to encouraged pt to use her assisted device to prevent falls both inside and outside the home. Stress the importance of the risk involved with severe falls or acute injuries  (pt with clear understanding).  Will encouraged pt to seek medical attention with any pain or swelling to her right hand where she injuired it earlier today (minor cut). Encourage while visit to watch the site and change the pressure dressing prior to leaving the pt today.  Plan of care will be reviewed along with all her goals that have been met as of today. Discussed graduating pt from the program and services as previously discussed last month if pt continues to do well with no severe and acute issues. Will verify pt fels comfortable managing her ongoing medical issues with no additional request or inquires.  Will verify pt has contact number if there are any further questions or inquires. Will alert pt that her provider will be notified of her disposition with [facility] services. Case will be closed.  [name], RN Care Management Coordinator [place] Office [phone]

## 2017-05-13 ENCOUNTER — Encounter: Payer: Self-pay | Admitting: *Deleted

## 2017-05-13 DIAGNOSIS — J9601 Acute respiratory failure with hypoxia: Secondary | ICD-10-CM | POA: Diagnosis not present

## 2017-05-13 DIAGNOSIS — R001 Bradycardia, unspecified: Secondary | ICD-10-CM | POA: Diagnosis not present

## 2017-05-14 ENCOUNTER — Other Ambulatory Visit: Payer: Self-pay | Admitting: Cardiovascular Disease

## 2017-05-23 DIAGNOSIS — I700 Atherosclerosis of aorta: Secondary | ICD-10-CM | POA: Diagnosis not present

## 2017-05-23 DIAGNOSIS — L400 Psoriasis vulgaris: Secondary | ICD-10-CM | POA: Diagnosis not present

## 2017-05-23 DIAGNOSIS — I10 Essential (primary) hypertension: Secondary | ICD-10-CM | POA: Diagnosis not present

## 2017-05-23 DIAGNOSIS — J439 Emphysema, unspecified: Secondary | ICD-10-CM | POA: Diagnosis not present

## 2017-05-26 DIAGNOSIS — M5136 Other intervertebral disc degeneration, lumbar region: Secondary | ICD-10-CM | POA: Diagnosis not present

## 2017-05-26 DIAGNOSIS — M48061 Spinal stenosis, lumbar region without neurogenic claudication: Secondary | ICD-10-CM | POA: Diagnosis not present

## 2017-06-13 DIAGNOSIS — J9601 Acute respiratory failure with hypoxia: Secondary | ICD-10-CM | POA: Diagnosis not present

## 2017-06-13 DIAGNOSIS — R001 Bradycardia, unspecified: Secondary | ICD-10-CM | POA: Diagnosis not present

## 2017-06-28 DIAGNOSIS — M5136 Other intervertebral disc degeneration, lumbar region: Secondary | ICD-10-CM | POA: Diagnosis not present

## 2017-06-28 DIAGNOSIS — M48061 Spinal stenosis, lumbar region without neurogenic claudication: Secondary | ICD-10-CM | POA: Diagnosis not present

## 2017-07-06 DIAGNOSIS — R05 Cough: Secondary | ICD-10-CM | POA: Diagnosis not present

## 2017-08-09 ENCOUNTER — Other Ambulatory Visit: Payer: Self-pay | Admitting: Allergy and Immunology

## 2017-08-09 DIAGNOSIS — M48061 Spinal stenosis, lumbar region without neurogenic claudication: Secondary | ICD-10-CM | POA: Diagnosis not present

## 2017-08-09 DIAGNOSIS — M5136 Other intervertebral disc degeneration, lumbar region: Secondary | ICD-10-CM | POA: Diagnosis not present

## 2017-08-09 DIAGNOSIS — M7062 Trochanteric bursitis, left hip: Secondary | ICD-10-CM | POA: Diagnosis not present

## 2017-09-01 ENCOUNTER — Ambulatory Visit: Payer: PPO | Admitting: Cardiovascular Disease

## 2017-09-01 ENCOUNTER — Encounter: Payer: Self-pay | Admitting: Cardiovascular Disease

## 2017-09-01 VITALS — BP 162/68 | Ht 60.0 in | Wt 152.0 lb

## 2017-09-01 DIAGNOSIS — I10 Essential (primary) hypertension: Secondary | ICD-10-CM

## 2017-09-01 DIAGNOSIS — E785 Hyperlipidemia, unspecified: Secondary | ICD-10-CM

## 2017-09-01 DIAGNOSIS — I25119 Atherosclerotic heart disease of native coronary artery with unspecified angina pectoris: Secondary | ICD-10-CM | POA: Diagnosis not present

## 2017-09-01 DIAGNOSIS — K219 Gastro-esophageal reflux disease without esophagitis: Secondary | ICD-10-CM

## 2017-09-01 DIAGNOSIS — I272 Pulmonary hypertension, unspecified: Secondary | ICD-10-CM | POA: Diagnosis not present

## 2017-09-01 MED ORDER — AMLODIPINE BESYLATE 5 MG PO TABS: 5.0000 mg | ORAL_TABLET | Freq: Every day | ORAL | 3 refills | Status: DC

## 2017-09-01 NOTE — Progress Notes (Signed)
Patient ID: Tammy [name], female   DOB: 03-Dec-1930, 84 y.o.   MRN: [id number]     Primary M.D.: Gus Height  HPI: Tammy [name] is a 84 y.o. female who presents to the office today for a 6 month followup cardiology evaluation.   Tammy [name] has established CAD andunderwent CABG surgery in 1994 after she was found to have high-grade left main stenosis. A nuclear perfusion study in June 2011 showed normal perfusion. An echo Doppler study in March 2013 showed mild-to-moderate mitral regurgitation, moderate tricuspid regurgitation, mild-to-moderate pulmonary hypertension with estimated RV systolic pressure 45 mm, mild to moderate LA dilatation, aortic sclerosis/calcification, and an ejection fraction greater than 55%. She has documented right renal artery stenosis on renal duplex scanning with the last scan in November 2013 suggesting a equal or greater than 60% diameter reduction by velocity in the right renal artery. The kidneys are otherwise normal in size, symmetrical in shape and had normal cortex and medulla. She does have a history of  hypertension and hyperlipidemia. In the past she also has had some issues with cellulitis of her lower extremity.   A two-year followup nuclear perfusion study on 11/16/2011; no significant EKG changes were demonstrated. The study was low risk and did not show any area of scar or infarction. This is unchanged from 2 years previously.  She has had issues with lower extremity edema and this improved with reduction of her amlodipine dose to 5 mg from 10 mg. A follow-up echo Doppler study continued to show normal systolic function.  There was mild left atrial dilatation and mild pulmonary hypertension with a PA pressure slightly improved from previously at 38 mm.  She did not have significant valvular abnormality.  A follow-up renal duplex study was unchanged from her prior study of one year previously and showed equal to less than 6% diameter reduction in the right renal  artery.  She had normal patency to the left renal artery.  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   She underwent a follow-up nuclear perfusion study in July 2016.  This was normal without scar or ischemia.  Ejection fraction was 74% and she had normal wall motion.  She did not develop any ECG changes.  She was hospitalized September and in October 2017.  She had episodes of lightheadedness upon standing, nausea, lower abdominal pain and confusion.  He was found to have renal failure acutely and was treated with intravenous fluids.  He had increased to 1.74.  He has a history of atrial fibrillation but was felt not to be a candidate for anticoagulation due to fall risk.  Her beta blocker had been discontinued due to bradycardia.  When she was admitted to [facility] from October 5 through 02/22/2016 she was treated with Cardizem.  She had an unremarkable CT of her head and neck.  She broke her wrist before Christmas.  She has been followed by Dr. Gus Height, for primary care.   I last saw her in October 2018.  At that time, her blood pressure was elevated. In the past, she apparently had some issues with diltiazem.  She was back on metoprolol.  With her continued blood pressure elevation.  I suggested the addition of amlodipine 5 mg.  Apparently there is no record that this ever was filled.  She is no longer fully aware of the medicines she is taking since her daughter provides her medicine for her.  At present she is on metoprolol,  tartrate 50 [place] grams twice a day, irbesartan 150 mg daily for hypertension.  She continues to be on low-dose atorvastatin 10 mg for hyperlipidemia.  She is on Aricept.  There is a history of depression for which she's on Cymbalta.  She denies recent wheezing on when necessary albuterol.  In addition to breo-ellipta.  She presents for evaluation.  Past Medical History:  Diagnosis Date  . Acid reflux   . Asthma    Asthmatic COPD  . Atrial  fibrillation ([place]) 01/01/2016  . Coronary artery disease    Echo 07/27/2011   . Coronary artery stenosis    , High-grade left main  . GERD (gastroesophageal reflux disease)   . Hyperlipidemia   . Hypertension   . IBS (irritable bowel syndrome)   . Psoriasis     Past Surgical History:  Procedure Laterality Date  . CARDIAC SURGERY    . CORONARY ARTERY BYPASS GRAFT  1994   After catheterization showed 90% ostial left main stenosis  . DOPPLER ECHOCARDIOGRAPHY     Study showed mild to moderate MR with moderate TR, mild to moderate pulmonary hypertension with an ejection fraction greater that 55%.   [name] EYE SURGERY    . Nuclear Perfusion  10/2010, 11/15/2012   Showed normal perfusion, No Ischemia or Infarction, Normal EF  . Renal Scan Duplex  04/09/2012   Showing greater than 50% diameter reduction in the celiac artery and SMA.. Right proximal 275 and Mid 152.  [name] ROTATOR CUFF REPAIR     bilateral  . Sleep Study  12/28/2006   AHI during total sleep time (3h 19 minutes) was 1.81/hr and during REM sleep at 17.78/hr. Mild sleep apnea during REM sleep.Oxygen staturated rate during REM and NREM was 93.0%.    Allergies  Allergen Reactions  . Penicillins Hives    Has patient had a PCN reaction causing immediate rash, facial/tongue/throat swelling, SOB or lightheadedness with hypotension: No Has patient had a PCN reaction causing severe rash involving mucus membranes or skin necrosis: Yes Has patient had a PCN reaction that required hospitalization No Has patient had a PCN reaction occurring within the last 10 years: Yes took again last year 2016 If all of the above answers are "NO", then may proceed with Cephalosporin use.   . Naprosyn [Naproxen]   . Lactose Intolerance (Gi) Other (See Comments)    Unknown- [facility] documented    Current Outpatient Medications  Medication Sig Dispense Refill  . albuterol (VENTOLIN HFA) 108 (90 Base) MCG/ACT inhaler Inhale 2 puffs  into the lungs every 4 (four) hours as needed for wheezing or shortness of breath. 1 Inhaler 1  . aspirin 81 MG tablet Take 81 mg by mouth daily.     [name] atorvastatin (LIPITOR) 10 MG tablet Take 1 tablet (10 mg total) by mouth at bedtime.    [name] BREO ELLIPTA 200-25 MCG/INH AEPB INHALE 1 PUFF INTO LUNGS ONCE DAILY 60 each 0  . donepezil (ARICEPT) 10 MG tablet Take 1 tablet (10 mg total) by mouth daily. 90 tablet 1  . DULoxetine (CYMBALTA) 60 MG capsule Take 60 mg by mouth daily. Reported on 08/05/2015  2  . fluticasone (FLONASE) 50 MCG/ACT nasal spray USE 1 TO 2 SPRAY(S) IN EACH NOSTRIL ONCE DAILY FOR  STUFFY  NOSE  OR  DRAINAGE 16 g 1  . fluticasone (FLONASE) 50 MCG/ACT nasal spray USE 1 TO 2 SPRAY(S) IN EACH NOSTRIL ONCE DAILY FOR  STUFFY  NOSE  OR  DRAINAGE  *PATIENT  NEEDS  OFFICE  VISIT* 48 g 1  . folic acid (FOLVITE) 1 MG tablet Take 1 tablet (1 mg total) by mouth daily.    . irbesartan (AVAPRO) 150 MG tablet Take 150 mg by mouth daily.    . metoprolol tartrate (LOPRESSOR) 50 MG tablet Take 50 mg by mouth 2 (two) times daily. Pt reports she is taking this medication    . Multiple Vitamins-Minerals (MULTIVITAMIN WITH MINERALS) tablet Take 1 tablet by mouth daily.    . Omega-3 Fatty Acids (FISH OIL) 1000 MG CAPS Take 1 capsule by mouth daily.    [name] omeprazole (PRILOSEC) 20 MG capsule Take 20 mg by mouth 2 (two) times daily.     . Probiotic Product (PROBIOTIC DAILY) CAPS Take 1 capsule by mouth daily.    . ranitidine (ZANTAC) 300 MG tablet TAKE 1 TABLET BY MOUTH AT BEDTIME 90 tablet 1  . amLODipine (NORVASC) 5 MG tablet Take 1 tablet (5 mg total) by mouth daily. 90 tablet 3   No current facility-administered medications for this visit.     Socially , she is divorced.  She denies any recent tobacco use. She has not been able to exercise regularly. pressu ROS General: Negative; No fevers, chills, or night sweats; was in for fatigue. HEENT: Negative; No changes in vision or hearing, sinus  congestion, difficulty swallowing Pulmonary: Negative; No cough, wheezing, shortness of breath, hemoptysis Cardiovascular: See history of present illness;  GI: Positive for diverticulitis, and frequent diarrhea;  GU: Positive for renal artery stenosis; No dysuria, hematuria, or difficulty voiding Musculoskeletal: Right wrist fracture. Hematologic/Oncology: Negative; no easy bruising, bleeding Endocrine: Negative; no heat/cold intolerance; no diabetes Neuro: Positive for memory loss, now on Aricept Skin: Negative; No rashes or skin lesions Psychiatric: Negative; No behavioral problems, depression Sleep: Negative; No snoring, daytime sleepiness, hypersomnolence, bruxism, restless legs, hypnogognic hallucinations, no cataplexy Other comprehensive 14 point system review is negative.   PE BP (!) 162/68   Ht 5' (1.524 m)   Wt 152 lb (68.9 kg)   HC 63" (160 cm)   BMI 29.69 kg/m    Repeat blood pressure 160/70  Wt Readings from Last 3 Encounters:  09/01/17 152 lb (68.9 kg)  05/12/17 140 lb (63.5 kg)  03/08/17 147 lb (66.7 kg)   General: Alert, oriented, no distress.  Skin: normal turgor, no rashes, warm and dry HEENT: Normocephalic, atraumatic. Pupils equal round and reactive to light; sclera anicteric; extraocular muscles intact; Nose without nasal septal hypertrophy Mouth/Parynx benign; Mallinpatti scale 3 Neck: No JVD, no carotid bruits; normal carotid upstroke Lungs: clear to ausculatation and percussion; no wheezing or rales Chest wall: without tenderness to palpitation Heart: PMI not displaced, RRR, s1 s2 normal, 2/6 systolic murmur, no diastolic murmur, no rubs, gallops, thrills, or heaves Abdomen: soft, nontender; no hepatosplenomehaly, BS+; abdominal aorta nontender and not dilated by palpation. Back: no CVA tenderness Pulses 2+ Musculoskeletal: full range of motion, normal strength, no joint deformities Extremities: no clubbing cyanosis or edema, Homan's sign negative    Neurologic: grossly nonfocal; Cranial nerves grossly wnl Psychologic: Normal mood and affect   ECG (independently read by me): normal sinus rhythm at 63 bpm.  No ectopy.  Normal intervals.  October 2018 ECG (independently read by me): Normal sinus rhythm at 87 bpm.  Nonspecific ST changes.  QTc interval 450 ms.  March 2018 ECG (independently read by me): Normal sinus rhythm at 78 bpm.  No ST segment changes.  Normal intervals.  April  2017 ECG (independently read by me): Normal sinus rhythm at 68 bpm.  Normal intervals.  No ST segment changes.  July 2016 ECG (independently read by me): Normal sinus rhythm at 70 bpm.  Nonspecific ST changes.  PR interval 178 ms.  QTc interval 429 ms.  November 02, 2013 ECG (independently read by me): Sinus bradycardia 55 beats per minute.  Nonspecific ST-T changes.  QTc interval 430 ms.  Borderline first degree AV block with a PR interval of 202 ms.  Prior September 2014 ECG: Normal sinus rhythm at 68 beats per minute. One isolated PVC. Nonspecific ST changes.  LABS:  BMP Latest Ref Rng & Units 12/09/2016 12/08/2016 02/21/2016  Glucose 65 - 99 mg/dL 133(H) 96 108(H)  BUN 6 - 20 mg/dL _0 Creatinine 0.44 - 1.00 mg/dL 0.99 0.88 0.84  Sodium 135 - 145 mmol/L 137 136 139  Potassium 3.5 - 5.1 mmol/L 3.7 4.4 3.6  Chloride 101 - 111 mmol/L 107 103 111  CO2 22 - 32 mmol/L _1 Calcium 8.9 - 10.3 mg/dL 8.3(L) 8.7(L) 8.7(L)   Hepatic Function Latest Ref Rng & Units 12/08/2016 02/21/2016 02/20/2016  Total Protein 6.5 - 8.1 g/dL 6.5 5.7(L) 6.1(L)  Albumin 3.5 - 5.0 g/dL 3.3(L) 2.9(L) 3.0(L)  AST 15 - 41 U/L 22 24 35  ALT 14 - 54 U/L 18 33 45  Alk Phosphatase 38 - 126 U/L 78 75 69  Total Bilirubin 0.3 - 1.2 mg/dL 0.4 0.4 0.6  Bilirubin, Direct 0.1 - 0.5 mg/dL - - -   CBC Latest Ref Rng & Units 12/09/2016 12/08/2016 02/21/2016  WBC 4.0 - 10.5 K/uL 10.9(H) 13.5(H) 9.7  Hemoglobin 12.0 - 15.0 g/dL 11.3(L) 12.3 10.4(L)  Hematocrit 36.0 - 46.0 % 36.4 39.1  33.9(L)  Platelets 150 - 400 K/uL 260 317 275   Lab Results  Component Value Date   MCV 82.4 12/09/2016   MCV 82.5 12/08/2016   MCV 91.6 02/21/2016   Lab Results  Component Value Date   TSH 1.963 02/20/2016   Lab Results  Component Value Date   HGBA1C  07/12/2009    5.3 (NOTE) The ADA recommends the following therapeutic goal for glycemic control related to Hgb A1c measurement: Goal of therapy: <6.5 Hgb A1c  Reference: American Diabetes Association: Clinical Practice Recommendations 2010, Diabetes Care, 2010, 33: (Suppl  1).   Lipid Panel     Component Value Date/Time   CHOL 118 01/03/2016 0304   CHOL 128 11/09/2012 0805   TRIG 106 01/03/2016 0304   TRIG 84 11/09/2012 0805   HDL 43 01/03/2016 0304   HDL 50 11/09/2012 0805   CHOLHDL 2.7 01/03/2016 0304   VLDL 21 01/03/2016 0304   LDLCALC 54 01/03/2016 0304   LDLCALC 61 11/09/2012 0805    RADIOLOGY: No results found.  IMPRESSION:  1. Essential hypertension   2. Hyperlipidemia with target LDL less than 70   3. Gastroesophageal reflux disease without esophagitis   4. Coronary artery disease involving native coronary artery of native heart with angina pectoris ([place])   5. Pulmonary hypertension (HCC)     ASSESSMENT AND PLAN: Tammy [name] is an 84 year old white female who has CAD and underwent CABG revascularization surgery in 1994. She has had issues with significant fatigue and mild shortness of breath with activity.  An echo Doppler study in June 2015 was not significantly change from previously.  PA pressure was elevated, but improved at 38 mm compared to 45 mmHg in 2013.  A nuclear perfusion study in July 2016 continued to show normal perfusion without scar or ischemia.  Her last echo Doppler study in May 2017 showed normal LV size with mild focal basal septal hypertrophy.  EF was 60-65%.  She had normal RV size and systolic function and there were no significant valvular abnormalities.  When I last saw her, I recommended  the initiation of amlodipine, but does not appear that this was ever done.  Her blood pressure today continues to be elevated.  I have recommended him again amlodipine 5 mg daily to be added to her current regimen consisting of irbesartan 150 mg in addition to metoprolol 50 mg twice a day.  Her pulse is stable in the 60s and she is in sinus rhythm.  She continues to be on atorvastatin 10 mg daily with target LDL less than 70.  Dr. Harrington Challenger is her primary M.D. who has been checking her laboratory.  I will try to obtain her most recent results. LDL one year ago in March 2018 was excellent at 49.  Her GERD is controlled with omeprazole.  She has short-term memory loss and is tolerating Aricept.  On exam there is no wheezing today and she continues to take  breo-ellipta.  She will be following up with Dr. Harrington Challenger.  Over the next month and repeat laboratory will be obtained.  I will see her in 6 months for reevaluation.  Time spent: 25 minutes [name], MD, [facility]  09/01/2017 7:40 PM

## 2017-09-01 NOTE — Patient Instructions (Signed)
Medication Instructions:  RESTART amlodipine 5 mg daily  Follow-Up: Your physician wants you to follow-up in: 6 months with Dr. Claiborne Billings.  You will receive a reminder letter in the mail two months in advance. If you don't receive a letter, please call our office to schedule the follow-up appointment.   Any Other Special Instructions Will Be Listed Below (If Applicable).     If you need a refill on your cardiac medications before your next appointment, please call your pharmacy.

## 2017-09-05 DIAGNOSIS — F324 Major depressive disorder, single episode, in partial remission: Secondary | ICD-10-CM | POA: Diagnosis not present

## 2017-09-05 DIAGNOSIS — I10 Essential (primary) hypertension: Secondary | ICD-10-CM | POA: Diagnosis not present

## 2017-09-05 DIAGNOSIS — J4540 Moderate persistent asthma, uncomplicated: Secondary | ICD-10-CM | POA: Diagnosis not present

## 2017-09-05 DIAGNOSIS — K219 Gastro-esophageal reflux disease without esophagitis: Secondary | ICD-10-CM | POA: Diagnosis not present

## 2017-09-05 DIAGNOSIS — M810 Age-related osteoporosis without current pathological fracture: Secondary | ICD-10-CM | POA: Diagnosis not present

## 2017-09-05 DIAGNOSIS — J439 Emphysema, unspecified: Secondary | ICD-10-CM | POA: Diagnosis not present

## 2017-09-05 DIAGNOSIS — E559 Vitamin D deficiency, unspecified: Secondary | ICD-10-CM | POA: Diagnosis not present

## 2017-09-05 DIAGNOSIS — Z0000 Encounter for general adult medical examination without abnormal findings: Secondary | ICD-10-CM | POA: Diagnosis not present

## 2017-09-05 DIAGNOSIS — I700 Atherosclerosis of aorta: Secondary | ICD-10-CM | POA: Diagnosis not present

## 2017-09-05 DIAGNOSIS — R2689 Other abnormalities of gait and mobility: Secondary | ICD-10-CM | POA: Diagnosis not present

## 2017-09-05 DIAGNOSIS — F0390 Unspecified dementia without behavioral disturbance: Secondary | ICD-10-CM | POA: Diagnosis not present

## 2017-09-05 DIAGNOSIS — E782 Mixed hyperlipidemia: Secondary | ICD-10-CM | POA: Diagnosis not present

## 2017-09-22 DIAGNOSIS — R0789 Other chest pain: Secondary | ICD-10-CM | POA: Diagnosis not present

## 2017-10-04 ENCOUNTER — Other Ambulatory Visit: Payer: Self-pay | Admitting: Allergy and Immunology

## 2017-10-06 IMAGING — US US PELVIS LIMITED
1 series · 6 of 6 positions shown · non-contrast
Comparison: CT 12/08/2016

CLINICAL DATA: Left groin pain for 1 month

EXAM:
LIMITED ULTRASOUND OF PELVIS
TECHNIQUE: Limited transabdominal ultrasound examination of the pelvis was
performed.

[Series 1: us pelvis limited · 0.10mm/px · 6 of 6 slices shown]
[im 1/6]
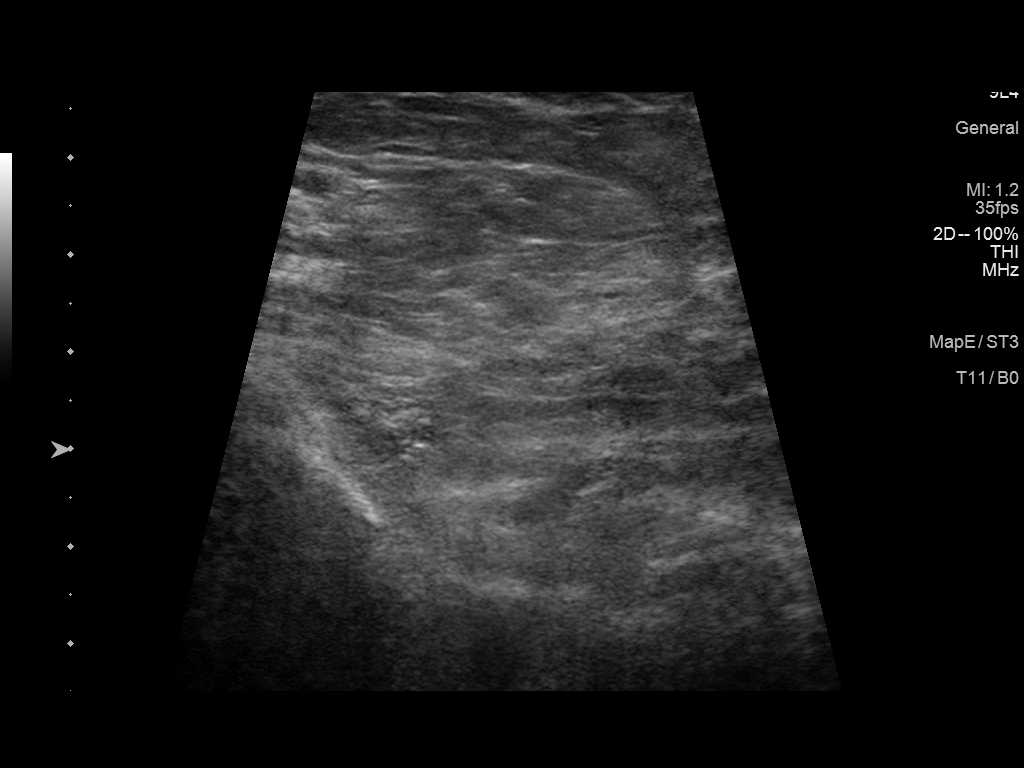
[im 2/6]
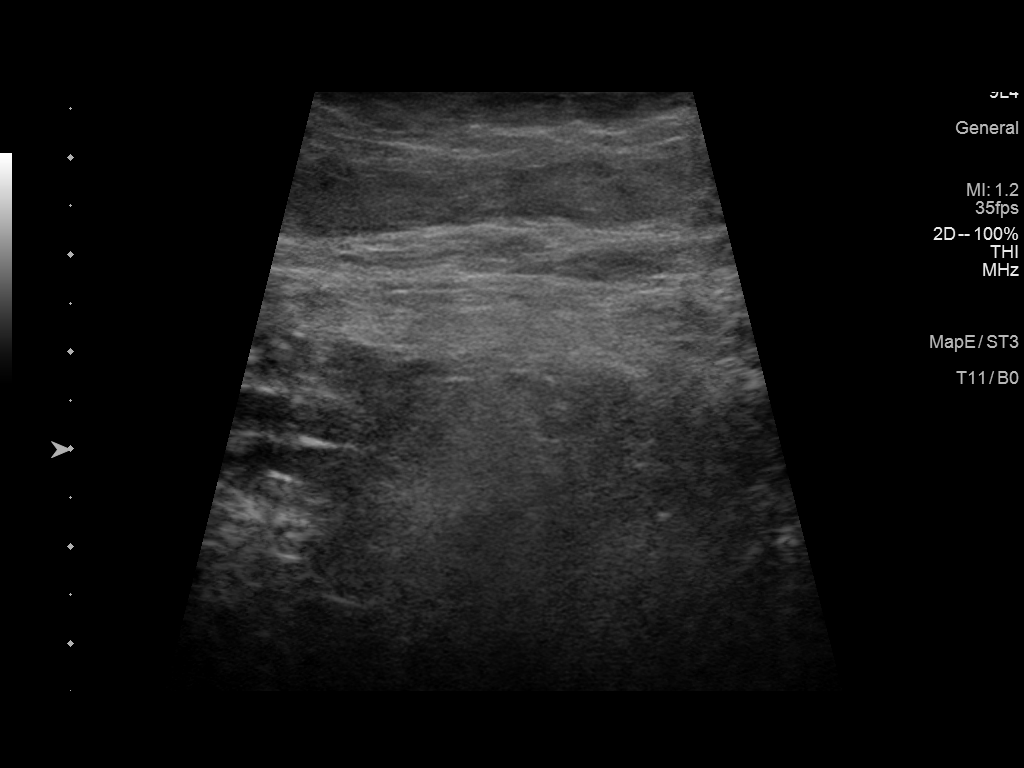
[im 3/6]
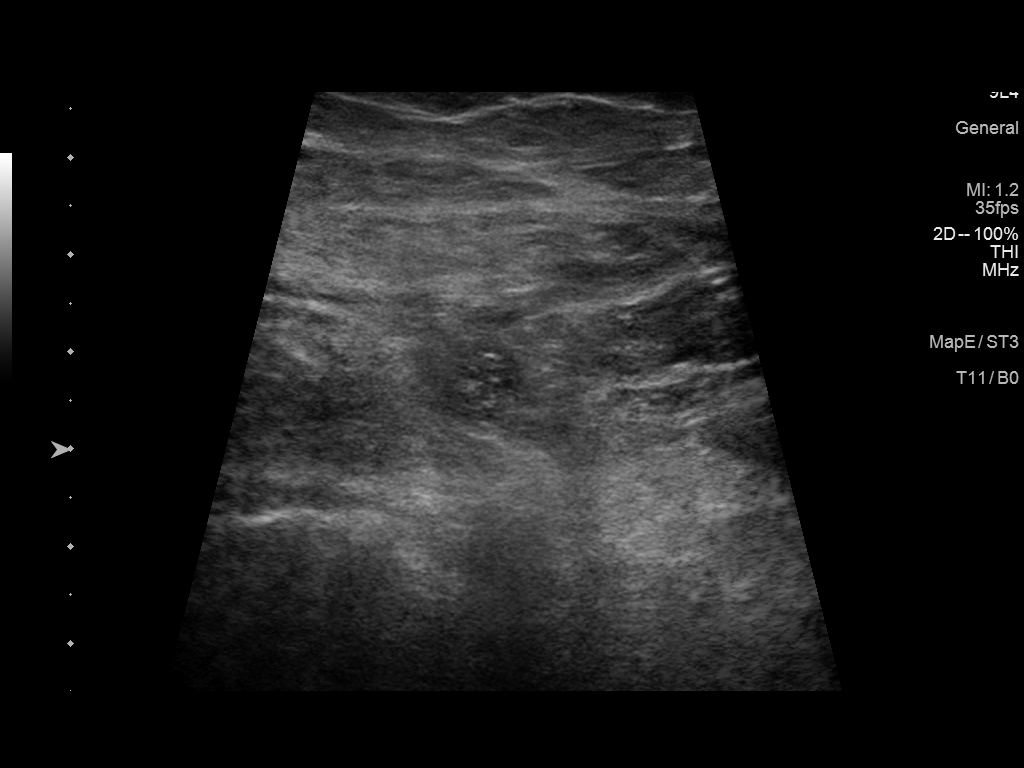
[im 4/6]
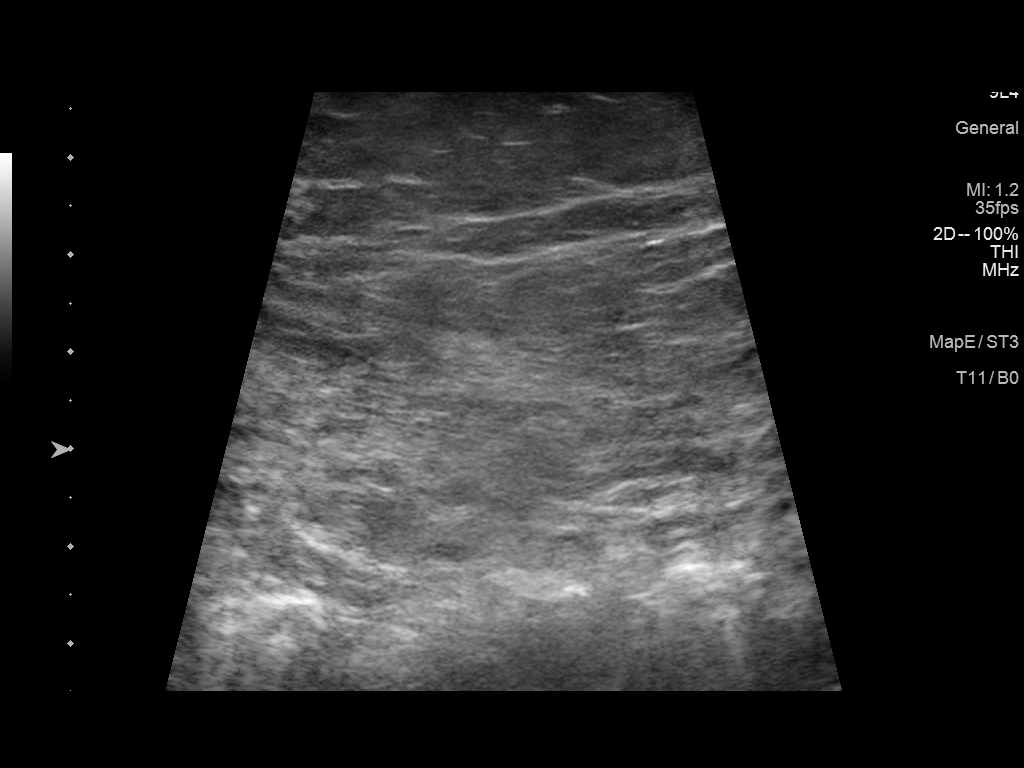
[im 5/6]
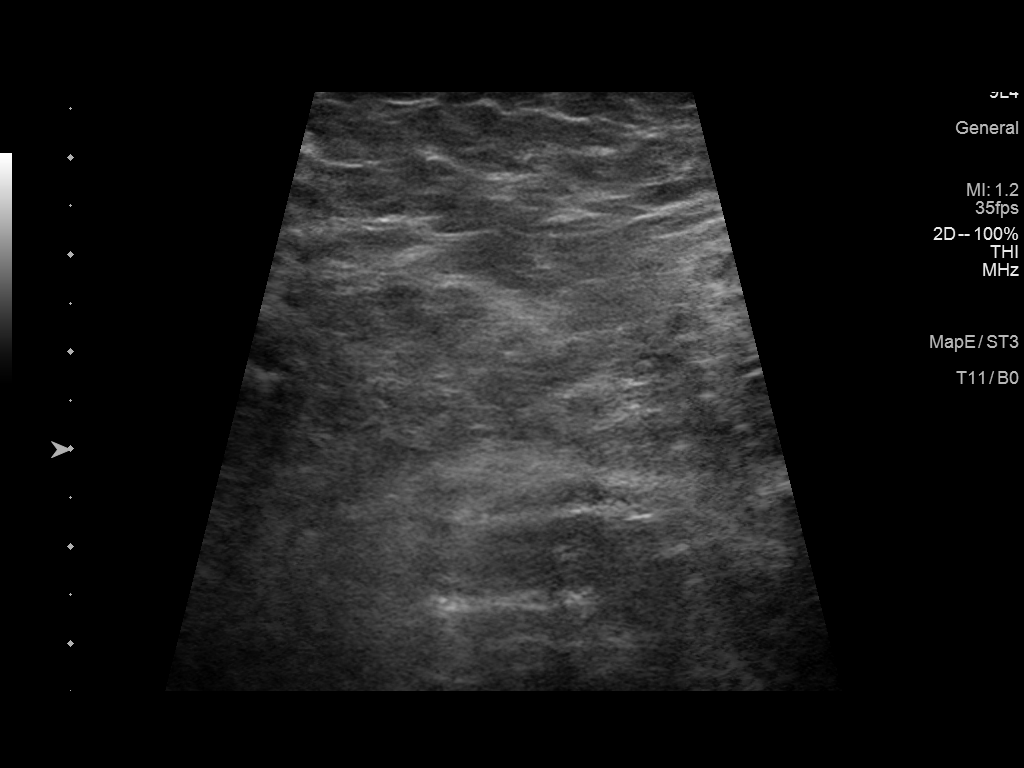
[im 6/6]
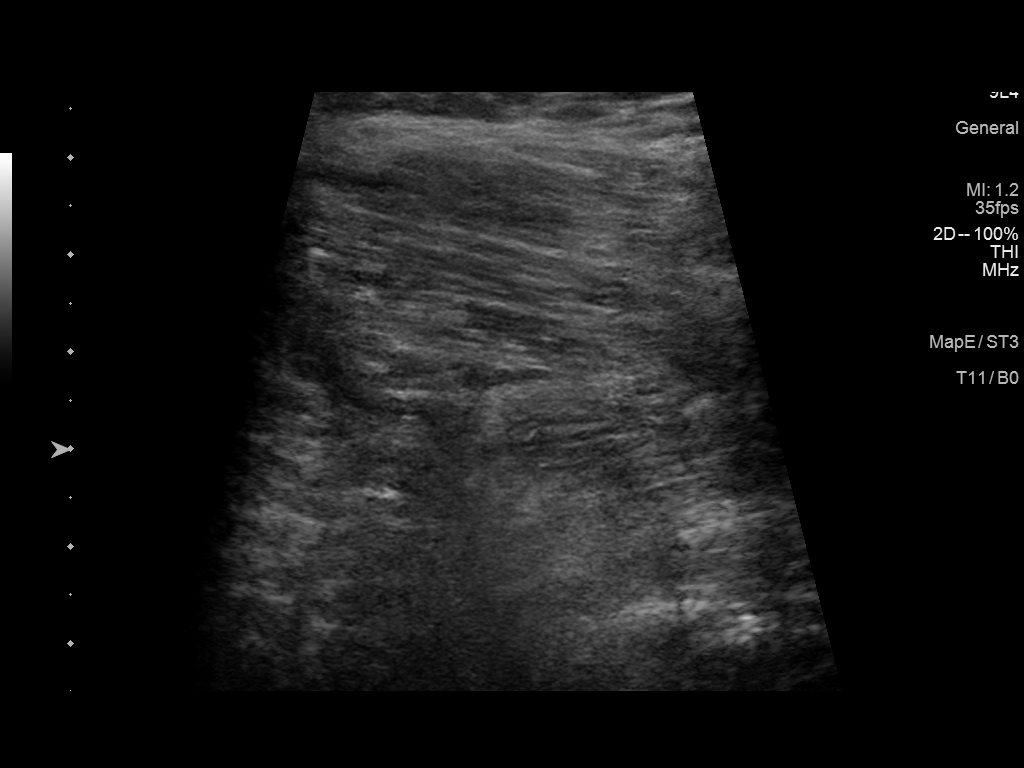

[6 of 6 positions shown; findings below may reference images not displayed]

FINDINGS: Targeted ultrasound of the left groin performed in the region of
pain. Negative for mass or fluid collection. Negative for bowel
containing hernia.
IMPRESSION: Negative targeted ultrasound of the left groin

## 2017-11-07 ENCOUNTER — Encounter: Payer: Self-pay | Admitting: *Deleted

## 2017-11-08 ENCOUNTER — Ambulatory Visit (INDEPENDENT_AMBULATORY_CARE_PROVIDER_SITE_OTHER): Payer: PPO | Admitting: Diagnostic Neuroimaging

## 2017-11-08 ENCOUNTER — Encounter: Payer: Self-pay | Admitting: Diagnostic Neuroimaging

## 2017-11-08 VITALS — BP 171/64 | HR 61 | Ht 60.0 in | Wt 151.8 lb

## 2017-11-08 DIAGNOSIS — F0390 Unspecified dementia without behavioral disturbance: Secondary | ICD-10-CM

## 2017-11-08 DIAGNOSIS — G3183 Dementia with Lewy bodies: Secondary | ICD-10-CM

## 2017-11-08 DIAGNOSIS — R269 Unspecified abnormalities of gait and mobility: Secondary | ICD-10-CM | POA: Diagnosis not present

## 2017-11-08 DIAGNOSIS — G3281 Cerebellar ataxia in diseases classified elsewhere: Secondary | ICD-10-CM | POA: Diagnosis not present

## 2017-11-08 DIAGNOSIS — G20 Parkinson's disease: Secondary | ICD-10-CM

## 2017-11-08 DIAGNOSIS — F03A0 Unspecified dementia, mild, without behavioral disturbance, psychotic disturbance, mood disturbance, and anxiety: Secondary | ICD-10-CM

## 2017-11-08 NOTE — Progress Notes (Signed)
[facility]  PATIENT: Tammy [name] DOB: 07-02-1930  REFERRING CLINICIAN: C A Ross HISTORY FROM: patient  REASON FOR VISIT: new consult    HISTORICAL  CHIEF COMPLAINT:  Chief Complaint  Patient presents with  . New Patient (Initial Visit)    Patient is here alone. She is referred for abnormal gait, loss of balance.     HISTORY OF PRESENT ILLNESS:   84 year old female here for evaluation of balance difficulty.  Patient has history of heart disease, atrial fibrillation, hypertension, dementia, tremor.  Patient arrives alone for this visit.  Patient reports gradual onset and progressive balance and gait difficulty at least for several years.  Patient lives alone.  Uses a cane to help balance and walk.  She is had multiple ER visits for falls and weakness.  She has generalized aches and pains in arms, legs, neck and low back.  Patient also has history of "dementia" and was previously seen in neurology clinic in 2015.  Patient is on donepezil.  She has limited support.  She has a daughter who lives nearby.  She otherwise lives alone, drives, takes care of most of her day-to-day needs, but with difficulty.  She is feeling more withdrawn and isolated.  She has looked into assisted living but does not want to make this transition yet.  She has some help with cleaning and light housework at home.    REVIEW OF SYSTEMS: Full 14 system review of systems performed and negative with exception of: Weight gain fatigue swelling in legs shortness of breath easy bruising memory loss too much sleep decreased energy joint pain incontinence.  ALLERGIES: Allergies  Allergen Reactions  . Penicillins Hives    Has patient had a PCN reaction causing immediate rash, facial/tongue/throat swelling, SOB or lightheadedness with hypotension: No Has patient had a PCN reaction causing severe rash involving mucus membranes or skin necrosis: Yes Has patient had a PCN reaction that required  hospitalization No Has patient had a PCN reaction occurring within the last 10 years: Yes took again last year 2016 If all of the above answers are "NO", then may proceed with Cephalosporin use.   . Naprosyn [Naproxen] Nausea Only    diarrhea  . Sertraline Diarrhea  . Lactose Intolerance (Gi) Other (See Comments)    Unknown- [facility] Health of [facility] documented    HOME MEDICATIONS: Outpatient Medications Prior to Visit  Medication Sig Dispense Refill  . albuterol (VENTOLIN HFA) 108 (90 Base) MCG/ACT inhaler Inhale 2 puffs into the lungs every 4 (four) hours as needed for wheezing or shortness of breath. 1 Inhaler 1  . aspirin 81 MG tablet Take 81 mg by mouth daily.     [name] atorvastatin (LIPITOR) 10 MG tablet Take 1 tablet (10 mg total) by mouth at bedtime.    . donepezil (ARICEPT) 10 MG tablet Take 1 tablet (10 mg total) by mouth daily. 90 tablet 1  . DULoxetine (CYMBALTA) 60 MG capsule Take 60 mg by mouth daily. Reported on 08/05/2015  2  . folic acid (FOLVITE) 1 MG tablet Take 1 tablet (1 mg total) by mouth daily.    . irbesartan (AVAPRO) 150 MG tablet Take 150 mg by mouth daily.    . metoprolol tartrate (LOPRESSOR) 50 MG tablet Take 50 mg by mouth 2 (two) times daily. Pt reports she is taking this medication    . omeprazole (PRILOSEC) 20 MG capsule Take 20 mg by mouth 2 (two) times daily.     . Probiotic  Product (PROBIOTIC DAILY) CAPS Take 1 capsule by mouth daily.    . ranitidine (ZANTAC) 300 MG tablet TAKE 1 TABLET BY MOUTH AT BEDTIME 90 tablet 1  . amLODipine (NORVASC) 5 MG tablet Take 1 tablet (5 mg total) by mouth daily. 90 tablet 3  . BREO ELLIPTA 200-25 MCG/INH AEPB INHALE 1 PUFF BY MOUTH ONCE DAILY 60 each 0  . fluticasone (FLONASE) 50 MCG/ACT nasal spray USE 1 TO 2 SPRAY(S) IN EACH NOSTRIL ONCE DAILY FOR  STUFFY  NOSE  OR  DRAINAGE 16 g 1  . fluticasone (FLONASE) 50 MCG/ACT nasal spray USE 1 TO 2 SPRAY(S) IN EACH NOSTRIL ONCE DAILY FOR  STUFFY  NOSE  OR  DRAINAGE  *PATIENT   NEEDS  OFFICE  VISIT* 48 g 1  . Multiple Vitamins-Minerals (MULTIVITAMIN WITH MINERALS) tablet Take 1 tablet by mouth daily.    . Omega-3 Fatty Acids (FISH OIL) 1000 MG CAPS Take 1 capsule by mouth daily.     No facility-administered medications prior to visit.     PAST MEDICAL HISTORY: Past Medical History:  Diagnosis Date  . Acid reflux   . Asthma    Asthmatic COPD  . Atrial fibrillation ([place]) 01/01/2016  . Coronary artery disease    Echo 07/27/2011   . Coronary artery stenosis    , High-grade left main  . Dementia   . GERD (gastroesophageal reflux disease)   . Hyperlipidemia   . Hypertension   . IBS (irritable bowel syndrome)   . Psoriasis     PAST SURGICAL HISTORY: Past Surgical History:  Procedure Laterality Date  . CARDIAC SURGERY    . CORONARY ARTERY BYPASS GRAFT  1994   After catheterization showed 90% ostial left main stenosis  . DOPPLER ECHOCARDIOGRAPHY     Study showed mild to moderate MR with moderate TR, mild to moderate pulmonary hypertension with an ejection fraction greater that 55%.   [name] EYE SURGERY    . Nuclear Perfusion  10/2010, 11/15/2012   Showed normal perfusion, No Ischemia or Infarction, Normal EF  . Renal Scan Duplex  04/09/2012   Showing greater than 50% diameter reduction in the celiac artery and SMA.. Right proximal 275 and Mid 152.  [name] ROTATOR CUFF REPAIR     bilateral  . Sleep Study  12/28/2006   AHI during total sleep time (3h 19 minutes) was 1.81/hr and during REM sleep at 17.78/hr. Mild sleep apnea during REM sleep.Oxygen staturated rate during REM and NREM was 93.0%.    FAMILY HISTORY: Family History  Problem Relation Age of Onset  . Heart disease Mother   . Hypertension Sister   . Colon cancer Brother   . Cancer Brother     SOCIAL HISTORY:  Social History   Socioeconomic History  . Marital status: Divorced    Spouse name: Not on file  . Number of children: 1  . Years of education: 17  . Highest education level: Not on  file  Occupational History  . Occupation: retired    Comment: retired  Scientific laboratory technician  . Financial resource strain: Not on file  . Food insecurity:    Worry: Not on file    Inability: Not on file  . Transportation needs:    Medical: Not on file    Non-medical: Not on file  Tobacco Use  . Smoking status: Former Smoker    Packs/day: 1.00    Years: 18.00    Pack years: 18.00    Last attempt to quit: 11/08/1962  Years since quitting: 55.0  . Smokeless tobacco: Never Used  Substance and Sexual Activity  . Alcohol use: No    Alcohol/week: 0.0 oz    Comment: h/o heavy use - 2 bottles of wine daily  . Drug use: No  . Sexual activity: Never  Lifestyle  . Physical activity:    Days per week: Not on file    Minutes per session: Not on file  . Stress: Not on file  Relationships  . Social connections:    Talks on phone: Not on file    Gets together: Not on file    Attends religious service: Not on file    Active member of club or organization: Not on file    Attends meetings of clubs or organizations: Not on file    Relationship status: Not on file  . Intimate partner violence:    Fear of current or ex partner: Not on file    Emotionally abused: Not on file    Physically abused: Not on file    Forced sexual activity: Not on file  Other Topics Concern  . Not on file  Social History Narrative   Patient lives at home alone.    Patient is divorced.    Patient has one child.    Patient has a high school education.    Patient quit tobacco 40 years ago.    Patient drinks coffee and tea daily.      PHYSICAL EXAM  GENERAL EXAM/CONSTITUTIONAL: Vitals:  Vitals:   11/08/17 1353  BP: (!) 171/64  Pulse: 61  Weight: 151 lb 12.8 oz (68.9 kg)  Height: 5' (1.524 m)     Body mass index is 29.65 kg/m.  No exam data present  Patient is in no distress; well developed, nourished and groomed; neck is supple  CARDIOVASCULAR:  Examination of carotid arteries is normal; no  carotid bruits  Regular rate and rhythm, no murmurs  Examination of peripheral vascular system by observation and palpation is normal  EYES:  Ophthalmoscopic exam of optic discs and posterior segments is normal; no papilledema or hemorrhages  MUSCULOSKELETAL:  Gait, strength, tone, movements noted in Neurologic exam below  NEUROLOGIC: MENTAL STATUS:  MMSE - [place] Exam 11/08/2017  Orientation to time 3  Orientation to Place 5  Registration 3  Attention/ Calculation 1  Recall 1  Language- name 2 objects 2  Language- repeat 0  Language- follow 3 step command 3  Language- read & follow direction 1  Write a sentence 1  Copy design 1  Total score 21    awake, alert, oriented to person   [place] attention and concentration  language fluent, comprehension intact, naming intact,   fund of knowledge appropriate  DECR INSIGHT  CRANIAL NERVE:   2nd - no papilledema on fundoscopic exam  2nd, 3rd, 4th, 6th - pupils equal and reactive to light, visual fields full to confrontation, extraocular muscles intact, no nystagmus  5th - facial sensation symmetric  7th - facial strength symmetric  8th - hearing intact  9th - palate elevates symmetrically, uvula midline  11th - shoulder shrug symmetric  12th - tongue protrusion midline  SLURRED SPEECH  MOTOR:   RESTING TREMOR IN BUE   HEAD TREMOR  COGWHEELING RIGIDITY IN RUE  BRADYKINESIA IN BUE AND BLE (LEFT WORSE THAN RIGHT)  normal bulk and tone, full strength in the BUE, BLE  SENSORY:   normal and symmetric to light touch; ABSENT VIB AT ANKLES  AND TOES  COORDINATION:   finger-nose-finger, fine finger movements SLOW  REFLEXES:   deep tendon reflexes TRACE and symmetric  GAIT/STATION:   UNSTEADY GAIT; LEFT HAND TREMOR WITH WALKING    DIAGNOSTIC DATA (LABS, IMAGING, TESTING) - I reviewed patient records, labs, notes, testing and imaging myself where available.  Lab Results    Component Value Date   WBC 10.9 (H) 12/09/2016   HGB 11.3 (L) 12/09/2016   HCT 36.4 12/09/2016   MCV 82.4 12/09/2016   PLT 260 12/09/2016      Component Value Date/Time   NA 137 12/09/2016 0256   K 3.7 12/09/2016 0256   CL 107 12/09/2016 0256   CO2 22 12/09/2016 0256   GLUCOSE 133 (H) 12/09/2016 0256   BUN 11 12/09/2016 0256   CREATININE 0.99 12/09/2016 0256   CREATININE 1.11 (H) 10/01/2015 0000   CALCIUM 8.3 (L) 12/09/2016 0256   PROT 6.5 12/08/2016 1502   ALBUMIN 3.3 (L) 12/08/2016 1502   AST 22 12/08/2016 1502   ALT 18 12/08/2016 1502   ALKPHOS 78 12/08/2016 1502   BILITOT 0.4 12/08/2016 1502   GFRNONAA 51 (L) 12/09/2016 0256   GFRAA 59 (L) 12/09/2016 0256   Lab Results  Component Value Date   CHOL 118 01/03/2016   HDL 43 01/03/2016   LDLCALC 54 01/03/2016   TRIG 106 01/03/2016   CHOLHDL 2.7 01/03/2016   Lab Results  Component Value Date   HGBA1C  07/12/2009    5.3 (NOTE) The ADA recommends the following therapeutic goal for glycemic control related to Hgb A1c measurement: Goal of therapy: <6.5 Hgb A1c  Reference: American Diabetes Association: Clinical Practice Recommendations 2010, Diabetes Care, 2010, 33: (Suppl  1).   Lab Results  Component Value Date   XHFSFSEL95 320 02/15/2016   Lab Results  Component Value Date   TSH 1.963 02/20/2016    02/20/16 CT head  - No acute intracranial abnormalities. Chronic atrophy and small vessel ischemic changes.  02/20/16 CT cervical  - Normal alignment of the cervical spine. Diffuse degenerative changes. No acute displaced fractures identified.     ASSESSMENT AND PLAN  84 y.o. year old female here with:  Dx:  Mild dementia Resting tremor / parkinsonism Gait difficulty  1. Mild dementia   2. Parkinsonism, unspecified Parkinsonism type ([place])   3. Gait difficulty      PLAN:  DEMENTIA / TREMOR (? dementia with lewy bodies vs parkinson's dementia) - check MRI brain (rule out secondary causes) -  safety and supervision issues reviewed - use rollator walker - caution with living alone - no driving  - will setup home health agency referral - I tried contacting patient's daughter, but no answer and voicemail setup - will request assistance from [facility] care manager  Orders Placed This Encounter  Procedures  . For home use only DME 4 wheeled rolling walker with seat  . MR BRAIN WO CONTRAST  . Ambulatory referral to [facility]   Return in about 6 months (around 05/10/2018) for with NP Daun Peacock).    Penni Bombard, MD 2/33/4356, 8:61 PM Certified in Neurology, Neurophysiology and Neuroimaging  [facility] [address], [place], [place] [address] [phone]

## 2017-11-08 NOTE — Patient Instructions (Signed)
-   use rollator walker  - caution with living alone  - no driving   - will setup home health agency referral

## 2017-11-09 ENCOUNTER — Telehealth: Payer: Self-pay | Admitting: Diagnostic Neuroimaging

## 2017-11-09 NOTE — Telephone Encounter (Signed)
health team order sent to GI. No auth they will reach out to the pt to schedule.  °

## 2017-11-10 ENCOUNTER — Telehealth: Payer: Self-pay | Admitting: Diagnostic Neuroimaging

## 2017-11-10 NOTE — Telephone Encounter (Signed)
Noted  

## 2017-11-10 NOTE — Telephone Encounter (Signed)
[facility] [phone] called to advise start of care will be on Tuesday per the patient.

## 2017-11-11 DIAGNOSIS — M48061 Spinal stenosis, lumbar region without neurogenic claudication: Secondary | ICD-10-CM | POA: Diagnosis not present

## 2017-11-14 ENCOUNTER — Other Ambulatory Visit: Payer: PPO

## 2017-11-15 NOTE — Telephone Encounter (Signed)
FYI-Denise with [facility] calling to advise there will be no home care for the patient because she is not home bound. She is not in agreement with not driving.

## 2017-11-15 NOTE — Telephone Encounter (Signed)
I have sent a  message  To Kendal Hymen. With [facility].

## 2017-11-15 NOTE — Telephone Encounter (Signed)
Please send referral to advanced home care. -VRP

## 2017-11-16 ENCOUNTER — Ambulatory Visit
Admission: RE | Admit: 2017-11-16 | Discharge: 2017-11-16 | Disposition: A | Discharge status: Home / Self Care | Payer: PPO | Source: Ambulatory Visit | Attending: Diagnostic Neuroimaging | Admitting: Diagnostic Neuroimaging

## 2017-11-16 DIAGNOSIS — G3281 Cerebellar ataxia in diseases classified elsewhere: Secondary | ICD-10-CM

## 2017-11-16 DIAGNOSIS — G20 Parkinson's disease: Secondary | ICD-10-CM

## 2017-11-16 DIAGNOSIS — G3183 Dementia with Lewy bodies: Secondary | ICD-10-CM | POA: Diagnosis not present

## 2017-11-21 ENCOUNTER — Telehealth: Payer: Self-pay | Admitting: *Deleted

## 2017-11-21 NOTE — Telephone Encounter (Signed)
LVM requesting call back tomorrow for MRI results.

## 2017-11-22 ENCOUNTER — Encounter: Payer: Self-pay | Admitting: *Deleted

## 2017-11-22 ENCOUNTER — Other Ambulatory Visit: Payer: Self-pay | Admitting: *Deleted

## 2017-11-22 NOTE — Patient Outreach (Signed)
[facility]) Care Management  11/22/2017  Tammy [name] April 24, 1931 [id number]  Referral from MD office-Dr. Leta Baptist: Reason: Mild Dementia, gait difficulty, lives alone, falls risk  Telephone call to patient; left HIPPA compliant voice mail requesting return call.  Plan: Geophysicist/field seismologist. Follow up 2-4 business days.  Sherrin Daisy, RN BSN [place] Management Coordinator [facility] Care Management  [phone]

## 2017-11-22 NOTE — Telephone Encounter (Addendum)
Received call back form patient. Informed her that her MRI brain showed atrophy. Advised her there are no other major findings, and it is slightly changed form her MRI in 02/2016. The patient then began to discuss her ED visits in 2017 stating she never knew what was wrong. She continued to restate that she doesn't know anything. This RN attempted to clarify the message, but the patient did not seem to understand. This RN asked if she had heard about HH. She stated she "didn't qualify". This RN asked if  Jackelyn Poling [name] is her daughter, and she stated "yes". This RN advised she will call daughter.  Patient verbalized understanding.  [place], on [place] and informed her of MRI results.  She stated understanding. This RN advised her Dr Leta Baptist ordered [facility], but patient stated she didn't qualify. Asked her if she is [facility] POA; she confirmed she is. This RN advised she fax  Copy to this office to be scanned into patient's chart. Also advised [name] to sign up for my chart. Jackelyn Poling stated she will then asked to call back.

## 2017-11-24 ENCOUNTER — Other Ambulatory Visit: Payer: Self-pay | Admitting: *Deleted

## 2017-11-24 DIAGNOSIS — Z1231 Encounter for screening mammogram for malignant neoplasm of breast: Secondary | ICD-10-CM | POA: Diagnosis not present

## 2017-11-24 NOTE — Patient Outreach (Signed)
[facility]) Care Management  11/24/2017  REXINE GOWENS 11-Dec-1930 [id number]  Referral from MD office-Dr. Leta Baptist: Reason: Mild Dementia, gait difficulty, lives alone, falls risk  Telephone call #2; patient was advised of reason for call & [facility] care management services.  Patient gave HIPPA verification.   Patient voices that she lives alone & is independent in her care; drives her self to MD appointments.  States she has had problems with balance for a long period of time now and uses cane or walker. States she does have some dementia and is forgetful but she is still independent in her care and able to function. Patient voices that she speaks for herself and makes her own medical decisions.  Patient difficult to understand at times  (states she wears dentures). States she has never had stroke.   States she has daughter who fixes her medications weekly and she takes medications as prescribed by MD.  Patient voices that she gets her  prescriptions filled at [facility].    States she has arthritis in spine so occasionally pays someone to help with housework. States she may need injections in spine in the future and will not be able to drive.   Voices that she currently wants to live in her home for now. States she has  looked at information on living facility in [place] which she likes but states not sure if she can afford and states long way from her doctors in [place].  Patient states interested in SCAT information for transportation when she can't drive herself and not sure if she qualifies for Medicaid.  Patient consents to Social  Work referral . States does not need RN Firefighter.   Plan: Refer to Clinical Social worker for [employer], possible assistance with long term planning.  Sherrin Daisy, RN BSN [place] Management Coordinator [facility] Care Management  [phone]

## 2017-11-25 ENCOUNTER — Other Ambulatory Visit: Payer: Self-pay

## 2017-11-25 NOTE — Patient Outreach (Signed)
[facility]) Care Management  11/25/2017  KAAVYA PUSKARICH 07-20-1930 [id number]  Initial outreach to the patient on today's date, HIPAA identifiers confirmed. BSW introduced self and the reason for today's call, indicating [name] received a referral to assist the patient with obtaining transportation via SCAT. The patient indicates she has difficulty finding transportation stating most of her friends have passed away and her daughter is extremely busy with work. The patient states her daughter will take off work for urgent needs when the patient is sick but she is not always available for regular appointments. BSW discussed SCAT with the patient. The patient is interested in completing a SCAT application. The patient is also interested in learning more about Medicaid eligibility requirements. A home visit has been scheduled for July 24th.  Daneen Schick, BSW, CDP [facility] [phone]

## 2017-11-28 DIAGNOSIS — M549 Dorsalgia, unspecified: Secondary | ICD-10-CM | POA: Diagnosis not present

## 2017-11-28 DIAGNOSIS — I10 Essential (primary) hypertension: Secondary | ICD-10-CM | POA: Diagnosis not present

## 2017-11-28 DIAGNOSIS — M810 Age-related osteoporosis without current pathological fracture: Secondary | ICD-10-CM | POA: Diagnosis not present

## 2017-11-29 ENCOUNTER — Other Ambulatory Visit: Payer: Self-pay | Admitting: Allergy and Immunology

## 2017-12-07 ENCOUNTER — Other Ambulatory Visit: Payer: Self-pay

## 2017-12-07 NOTE — Patient Outreach (Signed)
Tammy [name]) Care [name]  12/07/2017  Tammy [name] 01-31-1931 [id number]  BSW met with the patient in her home to conduct the initial home visit. It is noted the patient is wheezing and out of breath upon answering the door. The patient reports "I have asthma". The patient is visibly open-mouthed breathing with audible wheezing that can be heard across the room. The patient reports taking inhalers for asthma. The patient answers "yes" when BSW inquires if she has the inhalers on hand and if they help when utilized.  BSW reminded the patient the reason for today's home visit which is to assist with completion of a SCAT application. The patient is able to state name and date of birth. The patient is unable to recall the current year when asked to sign the SCAT application. BSW provided patient with the date by stating "July 24". BSW had to remind the patient that July was the seventh month prior to the patient writing the date.   During today's home visit the patient has trouble concentrating and jumps from one thought to another. BSW has to remind patient what she is speaking about in order to stay on track with thought process. The patient informs [name] she was recently told she may have COPD by Dr. Harrington Challenger. BSW inquired if the patient was to see a pulmonologist or had been placed on medications to assist with this diagnosis. The patient denies any further intervention stating there is "no way" she has COPD due to stopping smoking several years ago. The patient indicates "this isn't COPD this is asthma".  It is noted in the chart, [place] [name] referral was placed by the patients neurologist, Dr. Leta Baptist. BSW discussed referral reason with the patient. The patient indicated she does not plan to revisit Dr. Leta Baptist due to his recommendation the patient stop driving. The patient is adamant she is still able to drive. BSW explained that once SCAT is in place she no longer  needs to drive. The patient voices she will plan to drive and have SCAT as a "back-up". Upon chart review it is noted Dr. Leta Baptist planned to order home health for the patient to assist with gait instability and ordering a walker if needed. The patient denies any contact from [place].  BSW contacted Tammy [name] to inquire status of referral. Tammy [name] with [facility] states a referral is not in the system for the patient. Tammy [name] indicates the last order for this patient was received in January for the patient to receive oxygen. BSW to in-basket Dr. Leta Baptist to inquire if home health is still desired.   BSW placed a call to Dr. Alan Ripper office to discuss concern for patients shortness of breath noted on today's visit. BSW will plan to inquire if the patient currently has orders for oxygen as no oxygen therapy was noted during today's home visit.   This is a complex patient with multiple needs. The patient lives alone and reports limited support from her daughter. The patients daughter fills her pill box weekly and that is "the only time she visits" according to the patient due to a busy work life. The patient has recently been diagnosed with mild dementia but has no plans to stop driving. The patient has been active with Tammy [name] in 2018 for HTN. At which time the patient achieved goals.  During today's home visit it is noted the patient no longer monitors BP. The patient confirms she does have  a home BP cuff but does not monitor due to "being busy". The patient reports several falls where she is stuck on the ground until someone comes to assist. The patient reports unmanaged HTN recently with an added medication to her current regimen. BSW has placed an order to involve a [place] nurse to assist with patient needs.  Daneen Schick, BSW, CDP [facility] [phone]

## 2017-12-07 NOTE — Patient Outreach (Signed)
[facility]) Care Management  12/07/2017  Tammy [name] [birth year] [id number]  BSW met with the patient in her home to conduct the initial home visit. It is noted the patient is wheezing and out of breath upon answering the door. The patient reports "I have asthma". The patient is visibly open-mouthed breathing with audible wheezing that can be heard across the room. The patient reports taking inhalers for asthma. The patient answers "yes" when BSW inquires if she has the inhalers on hand and if they help when utilized.  BSW reminded the patient the reason for today's home visit which is to assist with completion of a SCAT application. The patient is able to state name and date of birth. The patient is unable to recall the current year when asked to sign the SCAT application. BSW provided patient with the date by stating "July 24". BSW had to remind the patient that July was the seventh month prior to the patient writing the date.   During today's home visit the patient has trouble concentrating and jumps from one thought to another. BSW has to remind patient what she is speaking about in order to stay on track with thought process. The patient informs [name] she was recently told she may have COPD by Dr. Harrington Challenger. BSW inquired if the patient was to see a pulmonologist or had been placed on medications to assist with this diagnosis. The patient denies any further intervention stating there is "no way" she has COPD due to stopping smoking several years ago. The patient indicates "this isn't COPD this is asthma".  It is noted in the chart, [facility] referral was placed by the patients neurologist, Dr. Leta Baptist. BSW discussed referral reason with the patient. The patient indicated she does not plan to revisit Dr. Leta Baptist due to his recommendation the patient stop driving. The patient is adamant she is still able to drive. BSW explained that once SCAT is in place she no longer  needs to drive. The patient voices she will plan to drive and have SCAT as a "back-up". Upon chart review it is noted Dr. Leta Baptist planned to order home health for the patient to assist with gait instability and ordering a walker if needed. The patient denies any contact from [place].  BSW contacted [facility] to inquire status of referral. Elmo Putt with [facility] states a referral is not in the system for the patient. Elmo Putt indicates the last order for this patient was received in January for the patient to receive oxygen. BSW to in-basket Dr. Leta Baptist to inquire if home health is still desired.   BSW placed a call to Dr. Alan Ripper office to discuss concern for patients shortness of breath noted on today's visit. BSW will plan to inquire if the patient currently has orders for oxygen as no oxygen therapy was noted during today's home visit.   This is a complex patient with multiple needs. The patient lives alone and reports limited support from her daughter. The patients daughter fills her pill box weekly and that is "the only time she visits" according to the patient due to a busy work life. The patient has recently been diagnosed with mild dementia but has no plans to stop driving. The patient has been active with [facility] in 2018 for HTN. At which time the patient achieved goals.  During today's home visit it is noted the patient no longer monitors BP. The patient confirms she does have  a home BP cuff but does not monitor due to "being busy". The patient reports several falls where she is stuck on the ground until someone comes to assist. The patient reports unmanaged HTN recently with an added medication to her current regimen. BSW has placed an order to involve a [place] nurse to assist with patient needs.  Daneen Schick, BSW, CDP [facility] [phone]

## 2017-12-08 ENCOUNTER — Other Ambulatory Visit: Payer: Self-pay

## 2017-12-08 NOTE — Patient Outreach (Signed)
[facility]) Care Management  12/08/2017  Tammy [name] 1930/12/02 [id number]   BSW received an incomming call from April with Dr. Harrington Challenger' office. April informed [name] the patient has not been diagnosed with COPD but rather emphysema. BSW inquired if the patient has current orders for oxygen considering the communication [name] received from [facility] indicating the patient had orders in January 2019 for in home oxygen. April indicated that upon reading Dr. Harrington Challenger' last physician note, the patients saturation was 88% during last weeks visit. The patient does not have current orders for oxygen therapy. April indicates the patients oxygen order was discontinued earlier this year but she is not sure why. BSW discussed patients high fall risk with April as that may have been a contributing factor to discontinue oxygen.  Daneen Schick, BSW, CDP [facility] [phone]

## 2017-12-12 ENCOUNTER — Other Ambulatory Visit: Payer: Self-pay | Admitting: *Deleted

## 2017-12-12 NOTE — Patient Outreach (Signed)
[facility]) Care Management  12/12/2017  [name] 11-27-1930 [id number]    RN spoke with pt today and introduced [facility] and the purpose for today's call. Pt verified identifiers and receptive to the conversation. RN explained the recent referral and offered to assist further. Pt was receptive to COPD management and a home visit was scheduled for Wednesday this week to further engage.   [name], RN Care Management Coordinator [place] Office [phone]

## 2017-12-13 DIAGNOSIS — M545 Low back pain: Secondary | ICD-10-CM | POA: Diagnosis not present

## 2017-12-13 DIAGNOSIS — M5136 Other intervertebral disc degeneration, lumbar region: Secondary | ICD-10-CM | POA: Diagnosis not present

## 2017-12-13 DIAGNOSIS — M48061 Spinal stenosis, lumbar region without neurogenic claudication: Secondary | ICD-10-CM | POA: Diagnosis not present

## 2017-12-14 ENCOUNTER — Other Ambulatory Visit: Payer: Self-pay | Admitting: *Deleted

## 2017-12-14 ENCOUNTER — Encounter: Payer: Self-pay | Admitting: *Deleted

## 2017-12-14 NOTE — Patient Outreach (Addendum)
[facility]) Care Management   12/14/2017  MARSHELLA TELLO February 21, 1931 [id number]  BROOKSIE ELLWANGER is an 84 y.o. female  Subjective:  Respiratory Failure w/ Hypoxia- Pt states she is not familar with these diagnosis and receptive to education today related to her respiratory failure. States she was a smoker in her "younger days" but stop smoking at age 80 years old. Pt denies any issues with her breathing indicating she coughs up a lot of sputum just in the morning but able to recover with no additional coughing throughout the day.  After much review pt is in the GREEN zone with again no distressful symptoms. APPOINTMENT: Pt states she needs to arrange an appointment to obtain her hearing aides. States she has completed the test and has her certification just needs an appointment. States she has had problems getting an appointment and requested assistance.  SKIN: Pt complains of skin itching and spots on her arm. State she has medication and uses it as prescribed but just need to remember to use it more often to get relief.   Objective:   Review of Systems  Constitutional: Negative.   HENT: Negative.   Eyes: Negative.   Respiratory: Negative.   Cardiovascular: Negative.   Gastrointestinal: Negative.   Genitourinary: Negative.   Musculoskeletal: Negative.   Skin: Positive for itching.       History of skin irritation with prescribed medication to assist when needed.  Neurological: Negative.   Endo/Heme/Allergies: Negative.   Psychiatric/Behavioral: Negative.     Physical Exam  Constitutional: She is oriented to person, place, and time. She appears well-developed and well-nourished.  HENT:  Right Ear: External ear normal.  Left Ear: External ear normal.  Eyes: EOM are normal.  Neck: Normal range of motion. Neck supple.  Cardiovascular: Normal heart sounds.  Respiratory: Effort normal and breath sounds normal.  GI: Soft. Bowel sounds are normal.  Musculoskeletal:  Normal range of motion.  Neurological: She is oriented to person, place, and time.  Skin: Skin is warm and dry.  Psychiatric: She has a normal mood and affect. Her behavior is normal. Judgment and thought content normal.    Encounter Medications:   Outpatient Encounter Medications as of 12/14/2017  Medication Sig Note  . albuterol (VENTOLIN HFA) 108 (90 Base) MCG/ACT inhaler Inhale 2 puffs into the lungs every 4 (four) hours as needed for wheezing or shortness of breath.   [name] aspirin 81 MG tablet Take 81 mg by mouth daily.    [name] atorvastatin (LIPITOR) 10 MG tablet Take 1 tablet (10 mg total) by mouth at bedtime.   [name] BREO ELLIPTA 200-25 MCG/INH AEPB INHALE 1 PUFF BY MOUTH ONCE DAILY   . donepezil (ARICEPT) 10 MG tablet Take 1 tablet (10 mg total) by mouth daily.   . DULoxetine (CYMBALTA) 60 MG capsule Take 60 mg by mouth daily. Reported on 08/05/2015 12/13/2016: Patient reports she is taking twice daily/am & PM  . fluticasone (FLONASE) 50 MCG/ACT nasal spray Place 2 sprays into both nostrils daily.   . folic acid (FOLVITE) 1 MG tablet Take 1 tablet (1 mg total) by mouth daily.   . irbesartan (AVAPRO) 150 MG tablet Take 150 mg by mouth daily.   . metoprolol tartrate (LOPRESSOR) 50 MG tablet Take 50 mg by mouth 2 (two) times daily. Pt reports she is taking this medication   . omeprazole (PRILOSEC) 20 MG capsule Take 20 mg by mouth 2 (two) times daily.    . Probiotic Product (PROBIOTIC  DAILY) CAPS Take 1 capsule by mouth daily.   . ranitidine (ZANTAC) 300 MG tablet TAKE 1 TABLET BY MOUTH AT BEDTIME    No facility-administered encounter medications on file as of 12/14/2017.     Functional Status:   In your present state of health, do you have any difficulty performing the following activities: 12/14/2017 01/04/2017  Hearing? Y -  Comment pt has a prescriptionf or a hearing aide pending approved for hearing aides -  Vision? N -  Difficulty concentrating or making decisions? Y -  Walking or climbing  stairs? Y -  Dressing or bathing? N -  Doing errands, shopping? Y N  Comment Sometimes to the neurologisit-THN Education officer, museum assisting with transportion services on these appointed days. -  Conservation officer, nature and eating ? Y -  Using the Toilet? N -  In the past six months, have you accidently leaked urine? Y -  Comment - -  Do you have problems with loss of bowel control? Y -  Comment IBS but this has improved with ongoing visits to [facility] provider -  Managing your Medications? N -  Comment - -  Managing your Finances? N -  Housekeeping or managing your Housekeeping? Y -  Some recent data might be hidden    Fall/Depression Screening:    Fall Risk  12/14/2017 11/24/2017 11/08/2017  Falls in the past year? Yes Yes Yes  Number falls in past yr: 2 or more 2 or more 2 or more  Injury with Fall? No Yes (No Data)  Comment - - bruises  Risk Factor Category  - High Fall Risk -  Risk for fall due to : Impaired balance/gait;History of fall(s) History of fall(s);Impaired balance/gait Impaired balance/gait  Follow up Falls prevention discussed - -   PHQ 2/9 Scores 12/14/2017 11/24/2017 12/23/2016 12/13/2016  PHQ - 2 Score 0 0 0 1   BP 122/62 (BP Location: Left Arm, Patient Position: Sitting, Cuff Size: Normal)   Pulse 60   Resp 20   Ht 1.524 m (5')   SpO2 98%   BMI 29.65 kg/m   Assessment:   THN services related to signed consent Case management related to respiratory failure w/ hypoxia Medical appointments related to hearing aids Skin issues related to irritation/itching  Plan:  Will obtained a signed consent for [facility] services. Will educate on respiratory distress w/ hypoxia. Will provide education printed material on What to do and When  To get help with acute symptoms. Will also education on COPD related to the zones of distress and confirm pt is in the GREEN with no distressful or precipitating symptoms. Will verified medications and pt's awareness on her scheduled inhaler and her emergency  inhaler.  Will offer to assist and contact her provider's office to arrange an appointment to obtain her hearing aids. Will also verify pt has sufficient transportation to get to this appointment.  Will encouraged pt to discuss her request with her dermatologist and continue using her topical medication as prescribed if this helps. RN offered to call and make an appointment however pt declined and indicated she would call. Plan of care generate with goals and interventions that pt has agreed to. Will provider tools [facility] calendar and pill box replace for better viewing of her medications)  for daily monitoring and encouraged pt on her adherence with daily medications. Will follow up next month and re-evaluate pt's adherence to the plan of care. Will also alert Dr. Harrington Challenger her primary care provider on pt's disposition with  [facility] services.  THN CM Care Plan Problem One     Most Recent Value  Care Plan Problem One  Knowlege deficit related to resipratory failure w/ hypoxia  Role Documenting the Problem One  Care Management [place] for Problem One  Active  THN Long Term Goal   Pt will have an increase knowledge base on her medical condition within the next 90 days.  THN Long Term Goal Start Date  12/14/17  Interventions for Problem One Long Term Goal  Will educate pt on respiratory failure and provided printed material with education on when to call her provider and what to do if symptoms are acute.  THN CM Short Term Goal #1   Pt will use her prescribed inhalers as prescribed over the next 30 days to prevent acute symptoms from occuring.  THN CM Short Term Goal #1 Start Date  12/14/17  Interventions for Short Term Goal #1  Will discussed the importance of using her regualr scheduled inhaler and her emergency inhalers to again prevent acute symptoms from occuring.   THN CM Short Term Goal #2   Pt will avoid environment factors that may increase the risk of exacerbated symptoms over the next 30  days.  THN CM Short Term Goal #2 Start Date  12/14/17  Interventions for Short Term Goal #2  Stress the importance of avoiding environmental factors that may exacerbate her symptoms.     [facility] CM Care Plan Problem Two     Most Recent Value  Care Plan Problem Two  Hearing issues pending appointment  Role Documenting the Problem Two  Care Management Coordinator  Care Plan for Problem Two  Active  THN CM Short Term Goal #1   Pt will attend the scheduled appointment over the 30 days.  THN CM Short Term Goal #1 Start Date  12/14/17  Interventions for Short Term Goal #2   Will assist and make an appointment for her hearing aides with her provider.       [name], RN Care Management Coordinator [place] Office [phone]

## 2017-12-16 NOTE — Patient Outreach (Signed)
[facility]) Care Management  12/16/2017  SHAQUERA ANSLEY 24-Nov-1930 [id number]  BSW received an in-basket message from [name], [facility] with [place]. Mrs. Zigmund Daniel indicated the patient is interested in adult day centers. BSW mailed resources to the patients home on today's date.  Daneen Schick, BSW, CDP [facility] [phone]

## 2017-12-26 ENCOUNTER — Other Ambulatory Visit: Payer: Self-pay

## 2017-12-26 NOTE — Patient Outreach (Signed)
[facility]) Care Management  12/26/2017  Tammy [name] 1931/01/31 [id number]  Successful outreach to the patient on today's date, HIPAA identifiers confirmed. The patient stated she has been in contact with SCAT and has an eligibility interview arranged. The patient received information on senior centers and programs mailed to her by BSW. The patient states she plans to visit [facility] as well as Tax adviser. The patient is interested in water aerobic classes but concerned about needing to wear briefs. BSW encouraged the patient to have her daughter order waterproof briefs found on [employer]. The patient stated she would have her daughter look for some as she enjoys water aerobics.  BSW to perform a discipline closure on today's date as no other social work needs have been identified at this time. The patient is aware that Tammy [name], Tammy [name] will continue to be involved.   Daneen Schick, BSW, CDP [facility] [phone]

## 2018-01-03 ENCOUNTER — Other Ambulatory Visit: Payer: Self-pay | Admitting: Allergy and Immunology

## 2018-01-03 NOTE — Telephone Encounter (Signed)
Courtesy refill  

## 2018-01-10 ENCOUNTER — Encounter: Payer: Self-pay | Admitting: Allergy and Immunology

## 2018-01-10 ENCOUNTER — Ambulatory Visit (INDEPENDENT_AMBULATORY_CARE_PROVIDER_SITE_OTHER): Payer: PPO | Admitting: Allergy and Immunology

## 2018-01-10 VITALS — BP 108/68 | HR 76 | Resp 20 | Ht <= 58 in | Wt 145.6 lb

## 2018-01-10 DIAGNOSIS — J4550 Severe persistent asthma, uncomplicated: Secondary | ICD-10-CM | POA: Diagnosis not present

## 2018-01-10 DIAGNOSIS — K219 Gastro-esophageal reflux disease without esophagitis: Secondary | ICD-10-CM

## 2018-01-10 DIAGNOSIS — J3089 Other allergic rhinitis: Secondary | ICD-10-CM

## 2018-01-10 NOTE — Progress Notes (Signed)
Follow-up Note  Referring Provider: [name], MD Primary Provider: [name], MD Date of Office Visit: 01/10/2018  Subjective:   Tammy [name] (DOB: [birth year]) is a 84 y.o. female who returns to the [facility] [place] on 01/10/2018 in re-evaluation of the following:  HPI: Tammy [name] presents to this clinic in evaluation of breathing issues.  I have not seen her in this clinic since 15 February 2017.  She still continues to have some issues with being somewhat out of breath.  She gets completely out of breath when she goes to the end of her driveway.  She does not have a tremendous amount of coughing or sputum production.  She continues to use her Memory Dance on a regular basis.  Her requirement for Ventolin HFA is about 2 or 3 times a week.  She has not really been having a tremendous amount of problems with her nose while using Flonase most days of the week.  She has not really been having much problems with reflux while continuing to use her PPI and ranitidine on a regular basis.  She has been having a significant problem with being unsteady and her tremor has apparently increased.  She did visit with a neurologist who felt that she had a combination of dementia and parkinsonism.  There did not appear to be any specific therapy administered for her parkinsonism.  Allergies as of 01/10/2018      Reactions   Penicillins Hives   Has patient had a PCN reaction causing immediate rash, facial/tongue/throat swelling, SOB or lightheadedness with hypotension: No Has patient had a PCN reaction causing severe rash involving mucus membranes or skin necrosis: Yes Has patient had a PCN reaction that required hospitalization No Has patient had a PCN reaction occurring within the last 10 years: Yes took again last year 2016 If all of the above answers are "NO", then may proceed with Cephalosporin use.   Naprosyn [naproxen] Nausea Only   diarrhea   Sertraline Diarrhea   Lactose  Intolerance (gi) Other (See Comments)   Unknown- [facility] documented      Medication List      albuterol 108 (90 Base) MCG/ACT inhaler Commonly known as:  PROVENTIL HFA;VENTOLIN HFA Inhale 2 puffs into the lungs every 4 (four) hours as needed for wheezing or shortness of breath.   aspirin 81 MG tablet Take 81 mg by mouth daily.   atorvastatin 10 MG tablet Commonly known as:  LIPITOR Take 1 tablet (10 mg total) by mouth at bedtime.   BREO ELLIPTA 200-25 MCG/INH Aepb Generic drug:  fluticasone furoate-vilanterol INHALE 1 PUFF BY MOUTH ONCE DAILY **LAST  REFILL,  NEEDS  APPOINTMENT**   donepezil 10 MG tablet Commonly known as:  ARICEPT Take 1 tablet (10 mg total) by mouth daily.   DULoxetine 60 MG capsule Commonly known as:  CYMBALTA Take 60 mg by mouth daily. Reported on 08/05/2015   fluticasone 50 MCG/ACT nasal spray Commonly known as:  FLONASE Place 2 sprays into both nostrils daily.   fluticasone 50 MCG/ACT nasal spray Commonly known as:  FLONASE USE 1 TO 2 SPRAY(S) IN EACH NOSTRIL ONCE DAILY FOR  STUFFY  NOSE  OR  DRAINAGE  **PATIENT  NEEDS  OFFICE  VISIT**   folic acid 1 MG tablet Commonly known as:  FOLVITE Take 1 tablet (1 mg total) by mouth daily.   irbesartan 150 MG tablet Commonly known as:  AVAPRO Take 150 mg by mouth  daily.   metoprolol tartrate 50 MG tablet Commonly known as:  LOPRESSOR Take 50 mg by mouth 2 (two) times daily. Pt reports she is taking this medication   omeprazole 20 MG capsule Commonly known as:  PRILOSEC Take 20 mg by mouth 2 (two) times daily.   PROBIOTIC DAILY Caps Take 1 capsule by mouth daily.   ranitidine 300 MG tablet Commonly known as:  ZANTAC TAKE 1 TABLET BY MOUTH AT BEDTIME   Turmeric 500 MG Caps Take 1,000 mg by mouth.   TYLENOL ARTHRITIS EXT RELIEF PO Take by mouth.       Past Medical History:  Diagnosis Date  . Acid reflux   . Asthma    Asthmatic COPD  . Atrial fibrillation  ([place]) 01/01/2016  . Coronary artery disease    Echo 07/27/2011   . Coronary artery stenosis    , High-grade left main  . Dementia   . GERD (gastroesophageal reflux disease)   . Hyperlipidemia   . Hypertension   . IBS (irritable bowel syndrome)   . Psoriasis     Past Surgical History:  Procedure Laterality Date  . CARDIAC SURGERY    . CORONARY ARTERY BYPASS GRAFT  1994   After catheterization showed 90% ostial left main stenosis  . DOPPLER ECHOCARDIOGRAPHY     Study showed mild to moderate MR with moderate TR, mild to moderate pulmonary hypertension with an ejection fraction greater that 55%.   [name] EYE SURGERY    . Nuclear Perfusion  10/2010, 11/15/2012   Showed normal perfusion, No Ischemia or Infarction, Normal EF  . Renal Scan Duplex  04/09/2012   Showing greater than 50% diameter reduction in the celiac artery and SMA.. Right proximal 275 and Mid 152.  [name] ROTATOR CUFF REPAIR     bilateral  . Sleep Study  12/28/2006   AHI during total sleep time (3h 19 minutes) was 1.81/hr and during REM sleep at 17.78/hr. Mild sleep apnea during REM sleep.Oxygen staturated rate during REM and NREM was 93.0%.    Review of systems negative except as noted in HPI / PMHx or noted below:  Review of Systems  Constitutional: Negative.   HENT: Negative.   Eyes: Negative.   Respiratory: Negative.   Cardiovascular: Negative.   Gastrointestinal: Negative.   Genitourinary: Negative.   Musculoskeletal: Negative.   Skin: Negative.   Neurological: Negative.   Endo/Heme/Allergies: Negative.   Psychiatric/Behavioral: Negative.      Objective:   Vitals:   01/10/18 1559  BP: 108/68  Pulse: 76  Resp: 20   Height: 4\' 10"  (147.3 cm)  Weight: 145 lb 9.6 oz (66 kg)   Physical Exam  Constitutional:  Halting tremulous voice, obvious tremor of upper extremity.  HENT:  Head: Normocephalic.  Right Ear: Tympanic membrane, external ear and ear canal normal.  Left Ear: Tympanic membrane, external ear  and ear canal normal.  Nose: Nose normal. No mucosal edema or rhinorrhea.  Mouth/Throat: Uvula is midline, oropharynx is clear and moist and mucous membranes are normal. No oropharyngeal exudate.  Eyes: Conjunctivae are normal.  Neck: Trachea normal. No tracheal tenderness present. No tracheal deviation present. No thyromegaly present.  Cardiovascular: Normal rate, regular rhythm, S1 normal, S2 normal and normal heart sounds.  No murmur heard. Pulmonary/Chest: Breath sounds normal. No stridor. No respiratory distress. She has no wheezes. She has no rales.  Musculoskeletal: She exhibits no edema.  Lymphadenopathy:       Head (right side): No tonsillar adenopathy present.  Head (left side): No tonsillar adenopathy present.    She has no cervical adenopathy.  Neurological: She is alert.  Skin: No rash noted. She is not diaphoretic. No erythema. Nails show no clubbing.    Diagnostics:    Spirometry was performed and demonstrated an FEV1 of 0.96 at 79 % of predicted.  The patient had an Asthma Control Test with the following results: ACT Total Score: 19.    Assessment and Plan:   1. Asthma, severe persistent, well-controlled   2. Perennial allergic rhinitis   3. LPRD (laryngopharyngeal reflux disease)     1. Use the following every day:   A. Breo 200 one inhalation one time per day  B. Omeprazole 20mg  one tablet two times per day  C. Ranitidine 300 one time per day - PM  D. flonase one spray each nostril 3-7 times per week  2. If Needed:   A. Ventolin HFA 2 puffs every 4-6 hours  B. Cetirizine  3. Return in 6 months or earlier if problem  4. Obtain a flu vaccine  5.  Revisit with neurologist about possible treatment for parkinsonism  Tammy [name] is stable regarding her respiratory tract issue while consistently using anti-inflammatory medications for her airway and therapy directed against reflux as noted above.  She certainly has a fair amount of neurological issues with  her very bad tremor and her quality of voice and being very unsteady when she walks.  I have asked her to return to see her neurologist about possible treatment for parkinsonism.  If she does well I will see her back in this clinic in 6 months or earlier if there is a problem.  Allena Katz, MD Allergy / Immunology [place]

## 2018-01-10 NOTE — Patient Instructions (Addendum)
  1. Use the following every day:   A. Breo 200 one inhalation one time per day  B. Omeprazole 20mg  one tablet two times per day  C. Ranitidine 300 one time per day - PM  D. flonase one spray each nostril 3-7 times per week  2. If Needed:   A. Ventolin HFA 2 puffs every 4-6 hours  B. Cetirizine  3. Return in 6 months or earlier if problem  4. Obtain a flu vaccine  5.  Revisit with neurologist about possible treatment for parkinsonism

## 2018-01-11 ENCOUNTER — Other Ambulatory Visit: Payer: Self-pay | Admitting: *Deleted

## 2018-01-11 ENCOUNTER — Telehealth: Payer: Self-pay | Admitting: *Deleted

## 2018-01-11 ENCOUNTER — Encounter: Payer: Self-pay | Admitting: Allergy and Immunology

## 2018-01-11 NOTE — Patient Outreach (Signed)
[facility]) Care Management   01/11/2018  Tammy [name] 26-Aug-1930 [id number]  Tammy [name] is an 84 y.o. female  Subjective:  RESPIRATORY: Pt reports she is doing well with no acute flare ups or bronchitis since the last home visit. Pt states she does not use her emergency inhaler that "much" but uses when needed.  Pt reports a completed understanding of when to seek medical attention with acute symptoms regarding her breathing. Pt is aware of when to rest during her activities.  Pt reports she avoids environmental factors that affects her breathing and keep her home safe from anyone that smokes. Pt states she has been reading all the printed material related to her breathing issues. APPOINTMENTS: Pt reports she had to cancel one appointment with [facility] due to not feeling well on the day of the appointment but will reschedule.    Objective:   Review of Systems  All other systems reviewed and are negative.   Physical Exam  Constitutional: She is oriented to person, place, and time. She appears well-developed and well-nourished.  HENT:  Right Ear: External ear normal.  Left Ear: External ear normal.  Eyes: EOM are normal.  Neck: Normal range of motion.  Cardiovascular: Normal heart sounds.  Respiratory: Effort normal and breath sounds normal.  GI: Soft. Bowel sounds are normal.  Musculoskeletal: Normal range of motion.  Neurological: She is alert and oriented to person, place, and time.  Skin: Skin is warm and dry.  Psychiatric: She has a normal mood and affect. Her behavior is normal. Judgment and thought content normal.    Encounter Medications:   Outpatient Encounter Medications as of 01/11/2018  Medication Sig Note  . Acetaminophen (TYLENOL ARTHRITIS EXT RELIEF PO) Take by mouth.   [name] albuterol (VENTOLIN HFA) 108 (90 Base) MCG/ACT inhaler Inhale 2 puffs into the lungs every 4 (four) hours as needed for wheezing or shortness of breath.   [name] aspirin 81  MG tablet Take 81 mg by mouth daily.    [name] atorvastatin (LIPITOR) 10 MG tablet Take 1 tablet (10 mg total) by mouth at bedtime.   [name] BREO ELLIPTA 200-25 MCG/INH AEPB INHALE 1 PUFF BY MOUTH ONCE DAILY **LAST  REFILL,  NEEDS  APPOINTMENT**   . donepezil (ARICEPT) 10 MG tablet Take 1 tablet (10 mg total) by mouth daily.   . DULoxetine (CYMBALTA) 60 MG capsule Take 60 mg by mouth daily. Reported on 08/05/2015 12/13/2016: Patient reports she is taking twice daily/am & PM  . fluticasone (FLONASE) 50 MCG/ACT nasal spray Place 2 sprays into both nostrils daily.   . folic acid (FOLVITE) 1 MG tablet Take 1 tablet (1 mg total) by mouth daily.   . irbesartan (AVAPRO) 150 MG tablet Take 150 mg by mouth daily.   . metoprolol tartrate (LOPRESSOR) 50 MG tablet Take 50 mg by mouth 2 (two) times daily. Pt reports she is taking this medication   . omeprazole (PRILOSEC) 20 MG capsule Take 20 mg by mouth 2 (two) times daily.    . Probiotic Product (PROBIOTIC DAILY) CAPS Take 1 capsule by mouth daily.   . ranitidine (ZANTAC) 300 MG tablet TAKE 1 TABLET BY MOUTH AT BEDTIME   . Turmeric 500 MG CAPS Take 1,000 mg by mouth.   . fluticasone (FLONASE) 50 MCG/ACT nasal spray USE 1 TO 2 SPRAY(S) IN EACH NOSTRIL ONCE DAILY FOR  STUFFY  NOSE  OR  DRAINAGE  **PATIENT  NEEDS  OFFICE  VISIT** (Patient not taking:  Reported on 01/11/2018)    No facility-administered encounter medications on file as of 01/11/2018.     Functional Status:   In your present state of health, do you have any difficulty performing the following activities: 12/14/2017  Hearing? Y  Comment pt has a prescriptionf or a hearing aide pending approved for hearing aides  Vision? N  Difficulty concentrating or making decisions? Y  Walking or climbing stairs? Y  Dressing or bathing? N  Doing errands, shopping? Y  Comment Sometimes to the neurologisit-[facility] social worker assisting with transportion services on these appointed days.  Preparing Food and eating ? Y   Using the Toilet? N  In the past six months, have you accidently leaked urine? Y  Do you have problems with loss of bowel control? Y  Comment IBS but this has improved with ongoing visits to [facility] provider  Managing your Medications? N  Managing your Finances? N  Housekeeping or managing your Housekeeping? Y  Some recent data might be hidden    Fall/Depression Screening:    Fall Risk  12/14/2017 11/24/2017 11/08/2017  Falls in the past year? Yes Yes Yes  Number falls in past yr: 2 or more 2 or more 2 or more  Injury with Fall? No Yes (No Data)  Comment - - bruises  Risk Factor Category  - High Fall Risk -  Risk for fall due to : Impaired balance/gait;History of fall(s) History of fall(s);Impaired balance/gait Impaired balance/gait  Follow up Falls prevention discussed - -   PHQ 2/9 Scores 12/14/2017 11/24/2017 12/23/2016 12/13/2016  PHQ - 2 Score 0 0 0 1   BP 120/60 (BP Location: Left Arm, Patient Position: Sitting, Cuff Size: Normal)   Pulse 70   Resp 20   SpO2 96%   [facility] CM Care Plan Problem One     Most Recent Value  Care Plan Problem One  Knowlege deficit related to resipratory failure w/ hypoxia  Role Documenting the Problem One  Care Management Coordinator  Care Plan for Problem One  Active  [facility] Long Term Goal   Pt will have an increase knowledge base on her medical condition within the next 90 days.  [facility] Long Term Goal Start Date  12/14/17  Interventions for Problem One Long Term Goal  Will reiterate on remaining symptoms free with no acute symptoms however will extend and continue educating pt on what to do if acute symptoms occur to avoid bronchitis and other acute medical conditions.   [facility] CM Short Term Goal #1   Pt will use her prescribed inhalers as prescribed over the next 30 days to prevent acute symptoms from occuring.  [facility] CM Short Term Goal #1 Start Date  12/14/17  [facility] CM Short Term Goal #1 Met Date  01/11/18  [facility] CM Short Term Goal #2   Pt will avoid environment  factors that may increase the risk of exacerbated symptoms over the next 30 days.  [facility] CM Short Term Goal #2 Start Date  12/14/17  [facility] CM Short Term Goal #2 Met Date  01/11/18    [facility] CM Care Plan Problem Two     Most Recent Value  Care Plan Problem Two  Hearing issues pending appointment  Role Documenting the Problem Two  Care Management Coordinator  Care Plan for Problem Two  Active  [facility] CM Short Term Goal #1   Pt will attend the scheduled appointment over the 30 days.  [facility] CM Short Term Goal #1 Start Date  12/14/17  Interventions  for Short Term Goal #2   Will have several pending appointments. Nettie Elm discuss the importance of attending al appointments or to rescheduled any missed appointments. Will also alert pt of possible fees to inquired on due to missed appointments with the providers. Will extend to allow adherence with pt's attending all pending appointments.     Assessment:   Ongoing case management related to respiratory distress Follow up on medical appointments Follow up on avoid hazardous environmental factors related to respiratory distress  Plan:  Will review the educational material and verified pt is clear from any distressful symptoms. Will also verify pt's use of her inhalers when needed and what to do if acute problems occur with her breathing. Will also review and discussed the current plan of care and adjust interventions based upon the pt's progress. Will continue to encourage adherence in managing her medical issues. Will verify all upcoming appointments and stress the importance of attending all medical appointments concerning her medical condition. Will reivew pending appointments and verified transportation (pt still drives and has [facility] as a back up if needed). Will verified pt continue to avoid hazardous environmental factors that affect her breathing. Will continue to encouraged pt to use her inhalers as needed.  Will follow up with another home visit in one month  and re-evaluate pt's progress on managing her ongoing care.  Will verify pt's has no request or inquires or assistance needed with anything at this time. Will again continue the current plan of care with adjusted interventions.  [name], RN Care Management Coordinator [place] Office [phone]

## 2018-01-11 NOTE — Telephone Encounter (Signed)
I called the patient and informed her. °

## 2018-01-11 NOTE — Telephone Encounter (Signed)
-----   Message from Jiles Prows, MD sent at 01/11/2018  8:53 AM EDT ----- Please inform Patient that I contacted Dr. Jannifer Franklin at [facility] neurology and he will try to arrange a meeting with her in the near future.

## 2018-01-12 ENCOUNTER — Telehealth: Payer: Self-pay | Admitting: *Deleted

## 2018-01-12 NOTE — Telephone Encounter (Signed)
Called daughter, Jackelyn Poling. Went to [employer] and mailbox full. Could not leave message. If she calls, Dr. Jannifer Franklin received message they would like to transfer care to him from Dr. Leta Baptist.   He requested a non-urgent prolonged RV.  If she calls, can offer 01/27/18 at 12pm or 02/03/18 or 02/24/18 at 12pm with Dr. Jannifer Franklin, thank you

## 2018-01-12 NOTE — Telephone Encounter (Signed)
I tried calling daughter again and unable to reach. I called pt. Scheduled appt for 02/24/18 at 12pm, check in 1130am. She read back appt date/time back correctly twice and wrote it down. She states daughter did not relay message about MRI results in the past. Advised it appears Verneita Griffes, RN spoke with her 11/22/17.

## 2018-02-01 ENCOUNTER — Other Ambulatory Visit: Payer: Self-pay | Admitting: *Deleted

## 2018-02-01 ENCOUNTER — Encounter (HOSPITAL_COMMUNITY): Payer: Self-pay

## 2018-02-01 ENCOUNTER — Emergency Department (HOSPITAL_COMMUNITY)
Admission: EM | Admit: 2018-02-01 | Discharge: 2018-02-02 | Disposition: A | Discharge status: Home / Self Care | Payer: PPO | Attending: Emergency Medicine | Admitting: Emergency Medicine

## 2018-02-01 DIAGNOSIS — Z79899 Other long term (current) drug therapy: Secondary | ICD-10-CM | POA: Insufficient documentation

## 2018-02-01 DIAGNOSIS — I10 Essential (primary) hypertension: Secondary | ICD-10-CM | POA: Insufficient documentation

## 2018-02-01 DIAGNOSIS — S51812A Laceration without foreign body of left forearm, initial encounter: Secondary | ICD-10-CM | POA: Insufficient documentation

## 2018-02-01 DIAGNOSIS — Y999 Unspecified external cause status: Secondary | ICD-10-CM | POA: Insufficient documentation

## 2018-02-01 DIAGNOSIS — R2681 Unsteadiness on feet: Secondary | ICD-10-CM | POA: Diagnosis not present

## 2018-02-01 DIAGNOSIS — S51819A Laceration without foreign body of unspecified forearm, initial encounter: Secondary | ICD-10-CM

## 2018-02-01 DIAGNOSIS — Z7982 Long term (current) use of aspirin: Secondary | ICD-10-CM | POA: Insufficient documentation

## 2018-02-01 DIAGNOSIS — S81812A Laceration without foreign body, left lower leg, initial encounter: Secondary | ICD-10-CM | POA: Diagnosis not present

## 2018-02-01 DIAGNOSIS — I959 Hypotension, unspecified: Secondary | ICD-10-CM | POA: Diagnosis not present

## 2018-02-01 DIAGNOSIS — I2510 Atherosclerotic heart disease of native coronary artery without angina pectoris: Secondary | ICD-10-CM | POA: Diagnosis not present

## 2018-02-01 DIAGNOSIS — W010XXA Fall on same level from slipping, tripping and stumbling without subsequent striking against object, initial encounter: Secondary | ICD-10-CM | POA: Insufficient documentation

## 2018-02-01 DIAGNOSIS — W19XXXA Unspecified fall, initial encounter: Secondary | ICD-10-CM

## 2018-02-01 DIAGNOSIS — R58 Hemorrhage, not elsewhere classified: Secondary | ICD-10-CM | POA: Diagnosis not present

## 2018-02-01 DIAGNOSIS — J45909 Unspecified asthma, uncomplicated: Secondary | ICD-10-CM | POA: Diagnosis not present

## 2018-02-01 DIAGNOSIS — Z87891 Personal history of nicotine dependence: Secondary | ICD-10-CM | POA: Diagnosis not present

## 2018-02-01 DIAGNOSIS — S81012A Laceration without foreign body, left knee, initial encounter: Secondary | ICD-10-CM | POA: Insufficient documentation

## 2018-02-01 DIAGNOSIS — Y9301 Activity, walking, marching and hiking: Secondary | ICD-10-CM | POA: Diagnosis not present

## 2018-02-01 DIAGNOSIS — Y929 Unspecified place or not applicable: Secondary | ICD-10-CM | POA: Insufficient documentation

## 2018-02-01 DIAGNOSIS — I480 Paroxysmal atrial fibrillation: Secondary | ICD-10-CM | POA: Diagnosis not present

## 2018-02-01 NOTE — Patient Outreach (Signed)
[facility]) Care Management  02/01/2018  Tammy [name] July 05, 1930 [id number]   DME   RN received a message that pt has requested assistance with obtaining a walker. RN spoke with pt today and confirm she has not requested this item with any [employer] or DME company. Pt states she has a walker that is old and used on outing but she is requesting a rollator with a seat to use when she gets tired. RN encouraged pt to contact her provider and request this item (rollator with a seat) and explained her provider will provide a prescription for this DME item over to the appropriate agency who in turn will contact her with the specifics on any co-pays or deductibles. Pt very appreciative an grateful for this information. RN encouraged pt to call her provider today with this request. Encouraged pt to use the current walker she has and locate rest area when needed until she receives the rollator that has a seat.   RN will remind pt of the upcoming home visit next week and follow up with the progress pt has made with this request. No other inquires or request at this time.  [name], RN Care Management Coordinator [place] Office [phone]

## 2018-02-01 NOTE — ED Provider Notes (Signed)
[facility] EMERGENCY DEPARTMENT Provider Note   CSN: [id number] Arrival date & time: 02/01/18  2128     History   Chief Complaint Chief Complaint  Patient presents with  . Fall    HPI Tammy [name] is a 84 y.o. female who had a mechanical fall and presents with skin tears. She has a history of frequent falls.  Her daughter who is at bedside states that she had some balance issues.  She states that she lost her footing and fell herself going down that she eased herself down but suffered skin tears she was trying to avoid hitting her head.  She denies hitting her head she denies neck pain, she denies any upper extremity weakness.  She has some numbness and tingling in her hands chronically from carpal tunnel syndrome but denies any new paresthesia.  Patient is not on any blood thinners. HPI  Past Medical History:  Diagnosis Date  . Acid reflux   . Asthma    Asthmatic COPD  . Atrial fibrillation ([place]) 01/01/2016  . Coronary artery disease    Echo 07/27/2011   . Coronary artery stenosis    , High-grade left main  . Dementia   . GERD (gastroesophageal reflux disease)   . Hyperlipidemia   . Hypertension   . IBS (irritable bowel syndrome)   . Psoriasis     Patient Active Problem List   Diagnosis Date Noted  . Mild dementia 11/08/2017  . Gait difficulty 11/08/2017  . Left groin pain 12/09/2016  . Acute respiratory failure with hypoxia ([place]) 12/08/2016  . Hypothermia 02/20/2016  . SIRS (systemic inflammatory response syndrome) ([place]) 02/19/2016  . Diverticulosis   . Encephalopathy   . Hyponatremia 02/14/2016  . Acute encephalopathy 02/14/2016  . Elevated lactic acid level 02/14/2016  . Dizziness 02/14/2016  . Abdominal pain 02/14/2016  . Symptomatic bradycardia 02/12/2016  . Leukocytosis 02/12/2016  . Depression 02/12/2016  . Renal insufficiency   . SOB (shortness of breath)   . Bradycardia on ECG 02/11/2016  . Atrial fibrillation with RVR ([place])  01/01/2016  . PAF (paroxysmal atrial fibrillation) ([place]) 01/01/2016  . Alcohol dependence with withdrawal with complication ([place]) 39/07/90  . Fall 01/01/2016  . AKI (acute kidney injury) ([place])   . Dehydration   . Exertional dyspnea 09/11/2015  . Memory loss 11/13/2013  . Abnormal involuntary movements(781.0) 11/13/2013  . Pulmonary hypertension ([place]) 11/02/2013  . Generalized weakness 11/02/2013  . Coronary artery disease involving native coronary artery of native heart with angina pectoris ([place]) 11/16/2012  . Mitral regurgitation 11/16/2012  . Renal artery stenosis ([place]) 11/16/2012  . Hyperlipidemia with target LDL less than 70 11/16/2012  . Essential hypertension 11/16/2012    Past Surgical History:  Procedure Laterality Date  . CARDIAC SURGERY    . CORONARY ARTERY BYPASS GRAFT  1994   After catheterization showed 90% ostial left main stenosis  . DOPPLER ECHOCARDIOGRAPHY     Study showed mild to moderate MR with moderate TR, mild to moderate pulmonary hypertension with an ejection fraction greater that 55%.   [name] EYE SURGERY    . Nuclear Perfusion  10/2010, 11/15/2012   Showed normal perfusion, No Ischemia or Infarction, Normal EF  . Renal Scan Duplex  04/09/2012   Showing greater than 50% diameter reduction in the celiac artery and SMA.. Right proximal 275 and Mid 152.  [name] ROTATOR CUFF REPAIR     bilateral  . Sleep Study  12/28/2006   AHI during total sleep time (  3h 19 minutes) was 1.81/hr and during REM sleep at 17.78/hr. Mild sleep apnea during REM sleep.Oxygen staturated rate during REM and NREM was 93.0%.     OB History   None      Home Medications    Prior to Admission medications   Medication Sig Start Date End Date Taking? Authorizing Provider  Acetaminophen (TYLENOL ARTHRITIS EXT RELIEF PO) Take by mouth.    [provider]  albuterol (VENTOLIN HFA) 108 (90 Base) MCG/ACT inhaler Inhale 2 puffs into the lungs every 4 (four) hours as needed for wheezing  or shortness of breath. 01/05/17   Kozlow, Donnamarie Poag, MD  aspirin 81 MG tablet Take 81 mg by mouth daily.     [provider]  atorvastatin (LIPITOR) 10 MG tablet Take 1 tablet (10 mg total) by mouth at bedtime. 01/06/16   Rai, Ripudeep K, MD  BREO ELLIPTA 200-25 MCG/INH AEPB INHALE 1 PUFF BY MOUTH ONCE DAILY **LAST  REFILL,  NEEDS  APPOINTMENT** 01/03/18   Kozlow, Donnamarie Poag, MD  donepezil (ARICEPT) 10 MG tablet Take 1 tablet (10 mg total) by mouth daily. 07/13/14   Marcial Pacas, MD  DULoxetine (CYMBALTA) 60 MG capsule Take 60 mg by mouth daily. Reported on 08/05/2015 09/13/14   [provider]  fluticasone (FLONASE) 50 MCG/ACT nasal spray Place 2 sprays into both nostrils daily.    [provider]  fluticasone (FLONASE) 50 MCG/ACT nasal spray USE 1 TO 2 SPRAY(S) IN EACH NOSTRIL ONCE DAILY FOR  STUFFY  NOSE  OR  DRAINAGE  **PATIENT  NEEDS  OFFICE  VISIT** Patient not taking: Reported on 01/11/2018 01/03/18   Jiles Prows, MD  folic acid (FOLVITE) 1 MG tablet Take 1 tablet (1 mg total) by mouth daily. 01/06/16   Rai, Ripudeep K, MD  irbesartan (AVAPRO) 150 MG tablet Take 150 mg by mouth daily.    [provider]  metoprolol tartrate (LOPRESSOR) 50 MG tablet Take 50 mg by mouth 2 (two) times daily. Pt reports she is taking this medication    [provider]  omeprazole (PRILOSEC) 20 MG capsule Take 20 mg by mouth 2 (two) times daily.  08/07/16   [provider]  Probiotic Product (PROBIOTIC DAILY) CAPS Take 1 capsule by mouth daily.    [provider]  ranitidine (ZANTAC) 300 MG tablet TAKE 1 TABLET BY MOUTH AT BEDTIME 05/16/17   [name], MD  Turmeric 500 MG CAPS Take 1,000 mg by mouth.    [provider]    Family History Family History  Problem Relation Age of Onset  . Heart disease Mother   . Hypertension Sister   . Colon cancer Brother   . Cancer Brother     Social History Social History   Tobacco Use  . Smoking status:  Former Smoker    Packs/day: 1.00    Years: 18.00    Pack years: 18.00    Last attempt to quit: 11/08/1962    Years since quitting: 55.2  . Smokeless tobacco: Never Used  Substance Use Topics  . Alcohol use: No    Alcohol/week: 0.0 standard drinks    Comment: h/o heavy use - 2 bottles of wine daily  . Drug use: No     Allergies   Penicillins; Naprosyn [naproxen]; Sertraline; and Lactose intolerance (gi)   Review of Systems Review of Systems Ten systems reviewed and are negative for acute change, except as noted in the HPI.    Physical Exam  Updated Vital Signs BP (!) 143/61   Pulse 66   Temp 97.8 F (36.6 C) (Oral)   Resp 18   Ht 5' (1.524 m)   Wt 63.5 kg   SpO2 96%   BMI 27.34 kg/m   Physical Exam  Constitutional: She is oriented to person, place, and time. She appears well-developed and well-nourished. No distress.  HENT:  Head: Normocephalic and atraumatic.  Eyes: Pupils are equal, round, and reactive to light. Conjunctivae and EOM are normal. No scleral icterus.  Neck: Normal range of motion.  Cardiovascular: Normal rate, regular rhythm and normal heart sounds. Exam reveals no gallop and no friction rub.  No murmur heard. Pulmonary/Chest: Effort normal and breath sounds normal. No respiratory distress.  Abdominal: Soft. Bowel sounds are normal. She exhibits no distension and no mass. There is no tenderness. There is no guarding.  Neurological: She is alert and oriented to person, place, and time.  Skin: Skin is warm and dry. She is not diaphoretic.  Skin tear left Forearm About 10 cm. Mild oozing from the wound. Skin tear Left knee 5 cm.   Psychiatric: Her behavior is normal.  Nursing note and vitals reviewed.    ED Treatments / Results  Labs (all labs ordered are listed, but only abnormal results are displayed) Labs Reviewed - No data to display  EKG None  Radiology No results found.  Procedures Procedures (including critical care  time)  Medications Ordered in ED Medications - No data to display   Initial Impression / Assessment and Plan / ED Course  I have reviewed the triage vital signs and the nursing notes.  Pertinent labs & imaging results that were available during my care of the patient were reviewed by me and considered in my medical decision making (see chart for details).     Patient wounds cleaned. I applied Mepitel dressings over the tears.  Bleeding controlled.  Patient and daughter at bedside given home wound care instructions.  She is advised to follow-up with her PCP in 2 days for wound check.  She should leave the Mepitel dressing on in order to maintain skin alignment.  She appears otherwise appropriate for discharge at this time.  Seen and shared visit with Dr. Sherry Ruffing.  Discussed return precautions.  Final Clinical Impressions(s) / ED Diagnoses   Final diagnoses:  Fall, initial encounter  Skin tear of forearm without complication, initial encounter  Skin tear of left lower leg without complication, initial encounter    ED Discharge Orders    None       Margarita Mail, PA-C 02/02/18 0119    [name], Gwenyth Allegra, MD 02/02/18 [phone]

## 2018-02-01 NOTE — ED Triage Notes (Signed)
[facility] reports pt from home. Pt fell this evening and has lacs on left arm and leg. Pt denies hitting head and use of blood thinners.  VS 158/127 70 hr, 95% RA cbg 11  Daughter coming

## 2018-02-02 ENCOUNTER — Other Ambulatory Visit: Payer: Self-pay | Admitting: *Deleted

## 2018-02-02 NOTE — Patient Outreach (Signed)
[place] [facility]) Care Management  02/02/2018  Tammy [name] Sep 20, 1930 [id number]    ED D/C 9/18  RN has a ED visit on yesterday due to a fall. States she was not using her walker. Pt states she has not contacted her provider to request a rollator. Pt reports she has dressings on her skin tears and will follow up on Saturday at the clinic. States she will request a prescription for a rollator with a seat at that time. RN stress the importance of using the available rolling walker both inside and outside the home. Stress the behavior modification to prevent falls/injuries. Pt has states she wants a special rollator and aware that it may not be covered by her insurance. Pt states she wants to go to the agency and view several prior to purchasing. Discussed [facility] however several other agencies have available rollator. Pt also indicated she has ordered online in the past from previous walkers.   Fall prevention discussed and generated in the plan of care for pt to use the available assisted devices both inside and outside the home. Pt is aware of the risk involved if not use. RN offered to assist further however nothing at this time as pt again remains aware of the risk if she does not use the walker or cane available in the home. RN has informed pt to seek the rollator of choice and contact RN to assist if needed however pt is aware she may have to purchase this item out of pocket based upon speical accommodations. No other inquires or request at this time.   [name], RN Care Management Coordinator [place] Office [phone]

## 2018-02-02 NOTE — Discharge Instructions (Addendum)
WOUND CARE   Keep area clean and dry for 48 hours. Do not remove bandage, if applied.  After 48 hours follow up with your primary care doctor.  At home keep the Mepitel Safetac bandage on your skin. You may gently wash wound with mild soap and warm water. Reapply a new bandage after cleaning wound, if directed.  Continue daily cleansing with soap and water until stitches/staples are removed.  Do not apply any ointments or creams to the wound while stitches/staples are in place, as this may cause delayed healing.  Seek medical careif you experience any of the following signs of infection: Swelling, redness, pus drainage, streaking, fever >101.0 F  Seek care if you experience excessive bleeding that does not stop after 15-20 minutes of constant, firm pressure.

## 2018-02-04 DIAGNOSIS — S81812A Laceration without foreign body, left lower leg, initial encounter: Secondary | ICD-10-CM | POA: Diagnosis not present

## 2018-02-04 DIAGNOSIS — S51012D Laceration without foreign body of left elbow, subsequent encounter: Secondary | ICD-10-CM | POA: Diagnosis not present

## 2018-02-08 DIAGNOSIS — S50819A Abrasion of unspecified forearm, initial encounter: Secondary | ICD-10-CM | POA: Diagnosis not present

## 2018-02-08 DIAGNOSIS — R296 Repeated falls: Secondary | ICD-10-CM | POA: Diagnosis not present

## 2018-02-09 ENCOUNTER — Other Ambulatory Visit: Payer: Self-pay | Admitting: Allergy and Immunology

## 2018-02-09 ENCOUNTER — Other Ambulatory Visit: Payer: Self-pay | Admitting: *Deleted

## 2018-02-09 NOTE — Patient Outreach (Cosign Needed)
Tammy [name] [facility]) Care Management   02/09/2018  Tammy [name] 1930/08/25 [id number]  Tammy [name] is an 84 y.o. female  Subjective:  RESPIRATORY: Pt reports she has been using her emergency inhalers more this week due to her ongoing asthma. States she has been dealing with this for a very long time due to her history with asthma. Pt feels she is managing this "as best she can".No complaints or reported issues other then her recent falls that resulted in a ED visit and skin tears. MEDICATIONS: Pt in need of several respiratory medications and accepted assistance with these refills (Breo, Flonase and Ventolin).  FALLS: Pt states she fell in the home that resulted in skin tears that Dr. Alan Ripper office has been dressing these wounds. Pt  reports recent dressing change on yesterday and will follow up in one week. DME: Pt has confirmed that she is okay with [place] supplying her rollator and has requested it to be delivery.  COMMUNITY RESOURCES: Pt states Dr. Alan Ripper office provided her with resources for [employer]. Also indicates other resources provided in [facility] calendar and pt verified she still has additional information from the previous Education officer, museum via [facility].   Objective:                     Physical Exam  Constitutional: She is oriented to person, place, and time. She appears well-developed and well-nourished.  HENT:  Right Ear: External ear normal.  Left Ear: External ear normal.  Eyes: EOM are normal.  Neck: Normal range of motion.  Cardiovascular: Normal heart sounds.  Respiratory: Effort normal and breath sounds normal.  GI: Soft.  Musculoskeletal: Normal range of motion.  Limited with unsteady gait at times.   Neurological: She is alert and oriented to person, place, and time.  Skin: Skin is warm and dry.  Psychiatric: She has a normal mood and affect. Her behavior is normal. Judgment and thought content normal.    Encounter Medications:    Outpatient Encounter Medications as of 02/09/2018  Medication Sig Note  . Acetaminophen (TYLENOL ARTHRITIS EXT RELIEF PO) Take by mouth.   [name] albuterol (VENTOLIN HFA) 108 (90 Base) MCG/ACT inhaler Inhale 2 puffs into the lungs every 4 (four) hours as needed for wheezing or shortness of breath.   [name] aspirin 81 MG tablet Take 81 mg by mouth daily.    [name] atorvastatin (LIPITOR) 10 MG tablet Take 1 tablet (10 mg total) by mouth at bedtime.   [name] BREO ELLIPTA 200-25 MCG/INH AEPB INHALE 1 PUFF BY MOUTH ONCE DAILY **LAST  REFILL,  NEEDS  APPOINTMENT**   . donepezil (ARICEPT) 10 MG tablet Take 1 tablet (10 mg total) by mouth daily.   . DULoxetine (CYMBALTA) 60 MG capsule Take 60 mg by mouth daily. Reported on 08/05/2015 12/13/2016: Patient reports she is taking twice daily/am & PM  . fluticasone (FLONASE) 50 MCG/ACT nasal spray Place 2 sprays into both nostrils daily.   . fluticasone (FLONASE) 50 MCG/ACT nasal spray USE 1 TO 2 SPRAY(S) IN EACH NOSTRIL ONCE DAILY FOR  STUFFY  NOSE  OR  DRAINAGE  **PATIENT  NEEDS  OFFICE  VISIT** (Patient not taking: Reported on 01/11/2018)   . folic acid (FOLVITE) 1 MG tablet Take 1 tablet (1 mg total) by mouth daily.   . irbesartan (AVAPRO) 150 MG tablet Take 150 mg by mouth daily.   . metoprolol tartrate (LOPRESSOR) 50 MG tablet Take 50 mg by mouth 2 (two)  times daily. Pt reports she is taking this medication   . omeprazole (PRILOSEC) 20 MG capsule Take 20 mg by mouth 2 (two) times daily.    . Probiotic Product (PROBIOTIC DAILY) CAPS Take 1 capsule by mouth daily.   . ranitidine (ZANTAC) 300 MG tablet TAKE 1 TABLET BY MOUTH AT BEDTIME   . Turmeric 500 MG CAPS Take 1,000 mg by mouth.    No facility-administered encounter medications on file as of 02/09/2018.     Functional Status:   In your present state of health, do you have any difficulty performing the following activities: 12/14/2017  Hearing? Y  Comment pt has a prescriptionf or a hearing aide pending approved for  hearing aides  Vision? N  Difficulty concentrating or making decisions? Y  Walking or climbing stairs? Y  Dressing or bathing? N  Doing errands, shopping? Y  Comment Sometimes to the neurologisit-[facility] social worker assisting with transportion services on these appointed days.  Preparing Food and eating ? Y  Using the Toilet? N  In the past six months, have you accidently leaked urine? Y  Do you have problems with loss of bowel control? Y  Comment IBS but this has improved with ongoing visits to [facility] provider  Managing your Medications? N  Managing your Finances? N  Housekeeping or managing your Housekeeping? Y  Some recent data might be hidden    Fall/Depression Screening:    Fall Risk  12/14/2017 11/24/2017 11/08/2017  Falls in the past year? Yes Yes Yes  Number falls in past yr: 2 or more 2 or more 2 or more  Injury with Fall? No Yes (No Data)  Comment - - bruises  Risk Factor Category  - High Fall Risk -  Risk for fall due to : Impaired balance/gait;History of fall(s) History of fall(s);Impaired balance/gait Impaired balance/gait  Follow up Falls prevention discussed - -   PHQ 2/9 Scores 12/14/2017 11/24/2017 12/23/2016 12/13/2016  PHQ - 2 Score 0 0 0 1   BP 116/60 (BP Location: Right Arm, Patient Position: Sitting, Cuff Size: Normal)   Pulse 76   Resp 20   SpO2 98%    Assessment:   Follow up respiratory status Medication refills needed Follow up on recent fall DME related to rollator  Community resources related to activity program  Plan:  Will verify pt continue to use her inhalers to prevent acute breathing issues. Will stress the importance of prevention measures and use her assisted devices. Will inquired use of emergency due to her asthma flare-ups. Pt states she gets good relief with no additional distress.  Will offer to call in refills or request refills via pt's pharmacy for several medications (Breo, Flonase and Ventolin). Pharmacy tech indicated she would call DR.  Ross's office and request refills and requested pt to call back tomorrow at 12:00pm (pt aware and updated). Will encouraged ongoing adherence with her daily medications to prevent acute symptoms from occurring.  Will verify no falls or related injuries since her last ED visit that resulted in skin tears. Will stress the importance of using her cane or available assisted devices rolling walker and/or available rollator that does not have a seat at this time but pt is aware to seek a nearby seating area when distress or SOB with ambulating in the rollator with no seat. Will verify she has a follow up appointment to change her dressings next week.  Will discuss once again use of the available DME until she is able to obtain  her requested rollator. RN contacted Dr. Alan Ripper office and left details with April for Dr. Harrington Challenger to submit a prescription to DME agency ([facility]) for a rollator with a seat (pt has agreed with this agency).  Will verify if pt needs additional resources. Note pt has had a Education officer, museum with [facility] recently and has been provided Curator in the [facility] calendar. Pt has no specific services but RN reviewed the printed material with community resources Clinical biochemist for future use.  Pt has opt to decline consultation with [facility] social worker at this tim indicating she has all the information from the last home visit and involvement. RN offered to contact [facility] Plan of care discussed and interventions adjusted accordingly.  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  [facility] Long Term Goal Start Date  12/14/17  Interventions for Problem One Long Term Goal   Will verify pt is aware when to take breaks with any SOB or issues breathing. Will verifiy pt has a quick recovery and aware when to conatct her provider with acute episodes.     [facility] CM Care Plan Problem Two     Most Recent Value  Care Plan Problem Two  Hearing issues pending appointment  Role Documenting the Problem Two  Care Management Coordinator  Care Plan for Problem Two  Not Active  [facility] CM Short Term Goal #1   Pt will attend the scheduled appointment over the 30 days.  [facility] CM Short Term Goal #1 Start Date  12/14/17  [facility] CM Short Term Goal #1 Met Date   02/09/18    [facility] CM Care Plan Problem Three     Most Recent Value  Care Plan Problem Three  Fall prevention  Role Documenting the Problem Three  Care Management Coordinator  Care Plan for Problem Three  Active  [facility]N CM Short Term Goal #1   Pt will adherence to use of  DME available over the next 30days  [facility]N CM Short Term Goal #1 Start Date  02/02/18  Interventions for Short Term Goal #1  Will extend this goal to allow adherence and for pt to obtain a new rollator to prevent ongoing risk for falls.      [name], RN Care Management Coordinator [place] Office [phone]

## 2018-02-14 DIAGNOSIS — J9601 Acute respiratory failure with hypoxia: Secondary | ICD-10-CM | POA: Diagnosis not present

## 2018-02-14 DIAGNOSIS — M25579 Pain in unspecified ankle and joints of unspecified foot: Secondary | ICD-10-CM | POA: Diagnosis not present

## 2018-02-14 DIAGNOSIS — R296 Repeated falls: Secondary | ICD-10-CM | POA: Diagnosis not present

## 2018-02-14 DIAGNOSIS — R001 Bradycardia, unspecified: Secondary | ICD-10-CM | POA: Diagnosis not present

## 2018-02-14 DIAGNOSIS — S51019A Laceration without foreign body of unspecified elbow, initial encounter: Secondary | ICD-10-CM | POA: Diagnosis not present

## 2018-02-16 ENCOUNTER — Other Ambulatory Visit: Payer: Self-pay | Admitting: *Deleted

## 2018-02-16 DIAGNOSIS — L03116 Cellulitis of left lower limb: Secondary | ICD-10-CM | POA: Diagnosis not present

## 2018-02-16 NOTE — Patient Outreach (Signed)
[facility]) Care Management  02/16/2018  MEARA WIECHMAN 1931/02/03 [id number]  DME via [place] followed up with pt to inquired on the recent request from her primary for a prescription to [place] for a rollator. Pt not available however RN able to leave a HIPAA approved voice message requesting a call back. Will inquire further on the progress of this requested item.   [name], RN Care Management Coordinator [place] Office [phone]

## 2018-02-22 DIAGNOSIS — R0781 Pleurodynia: Secondary | ICD-10-CM | POA: Diagnosis not present

## 2018-02-22 DIAGNOSIS — S41102A Unspecified open wound of left upper arm, initial encounter: Secondary | ICD-10-CM | POA: Diagnosis not present

## 2018-02-24 ENCOUNTER — Ambulatory Visit: Payer: PPO | Admitting: Neurology

## 2018-03-08 DIAGNOSIS — E559 Vitamin D deficiency, unspecified: Secondary | ICD-10-CM | POA: Diagnosis not present

## 2018-03-08 DIAGNOSIS — E782 Mixed hyperlipidemia: Secondary | ICD-10-CM | POA: Diagnosis not present

## 2018-03-08 DIAGNOSIS — F0390 Unspecified dementia without behavioral disturbance: Secondary | ICD-10-CM | POA: Diagnosis not present

## 2018-03-08 DIAGNOSIS — R2689 Other abnormalities of gait and mobility: Secondary | ICD-10-CM | POA: Diagnosis not present

## 2018-03-08 DIAGNOSIS — Z23 Encounter for immunization: Secondary | ICD-10-CM | POA: Diagnosis not present

## 2018-03-08 DIAGNOSIS — I10 Essential (primary) hypertension: Secondary | ICD-10-CM | POA: Diagnosis not present

## 2018-03-08 DIAGNOSIS — R296 Repeated falls: Secondary | ICD-10-CM | POA: Diagnosis not present

## 2018-03-09 ENCOUNTER — Other Ambulatory Visit: Payer: Self-pay | Admitting: *Deleted

## 2018-03-09 NOTE — Patient Outreach (Signed)
[place] [facility]) Care Management   03/09/2018  SHAYLYNNE LUNT 1931-01-04 [id number]  Tammy [name] is an 84 y.o. female  Subjective: RESPIRATORY: Pt reports her breathing is "better". Pt states she has not had to use her rescue inhaler in over two weeks.  FALLS: Pt reports she has a falls once again 2 weeks ago bending over pick up dog peep pads. Pt states she was not using any assisted devices at the time of this falls. Pt states she does not use the available rollator that in the home because "it's to big". States she went to the walk in clinic (2 fractured ribs). Pt states she was given pain medications that she has completed as recommended. DME: Pt reports she received the rollator RN requested via provider's office last month and however that device is in her car for her outings. Pt also has a larger rollator that remains in the home but pt again does not use.   Objective:   Review of Systems  All other systems reviewed and are negative.   Physical Exam  Constitutional: She is oriented to person, place, and time. She appears well-developed and well-nourished.  HENT:  Right Ear: External ear normal.  Left Ear: External ear normal.  Eyes: EOM are normal.  Neck: Normal range of motion.  Cardiovascular: Normal heart sounds.  Respiratory: Effort normal and breath sounds normal.  GI: Soft. Bowel sounds are normal.  Musculoskeletal: Normal range of motion.  Neurological: She is alert and oriented to person, place, and time.  Skin: Skin is warm and dry.  Psychiatric: She has a normal mood and affect. Her behavior is normal. Judgment and thought content normal.    Encounter Medications:   Outpatient Encounter Medications as of 03/09/2018  Medication Sig Note  . Acetaminophen (TYLENOL ARTHRITIS EXT RELIEF PO) Take by mouth.   [name] albuterol (PROVENTIL HFA;VENTOLIN HFA) 108 (90 Base) MCG/ACT inhaler INHALE 2 PUFFS BY MOUTH EVERY 4 TO 6 HOURS AS NEEDED FOR COUGH OR WHEEZING    . albuterol (VENTOLIN HFA) 108 (90 Base) MCG/ACT inhaler Inhale 2 puffs into the lungs every 4 (four) hours as needed for wheezing or shortness of breath.   [name] aspirin 81 MG tablet Take 81 mg by mouth daily.    [name] atorvastatin (LIPITOR) 10 MG tablet Take 1 tablet (10 mg total) by mouth at bedtime.   . donepezil (ARICEPT) 10 MG tablet Take 1 tablet (10 mg total) by mouth daily.   . DULoxetine (CYMBALTA) 60 MG capsule Take 60 mg by mouth daily. Reported on 08/05/2015 12/13/2016: Patient reports she is taking twice daily/am & PM  . fluticasone (FLONASE) 50 MCG/ACT nasal spray Place 2 sprays into both nostrils daily.   . fluticasone (FLONASE) 50 MCG/ACT nasal spray USE 1 TO 2 SPRAY(S) IN EACH NOSTRIL ONCE DAILY FOR  STUFFY  NOSE  OR  DRAINAGE   . fluticasone furoate-vilanterol (BREO ELLIPTA) 200-25 MCG/INH AEPB Inhale 1 puff into the lungs daily.   . folic acid (FOLVITE) 1 MG tablet Take 1 tablet (1 mg total) by mouth daily.   . irbesartan (AVAPRO) 150 MG tablet Take 150 mg by mouth daily.   . metoprolol tartrate (LOPRESSOR) 50 MG tablet Take 50 mg by mouth 2 (two) times daily. Pt reports she is taking this medication   . omeprazole (PRILOSEC) 20 MG capsule Take 20 mg by mouth 2 (two) times daily.    . Probiotic Product (PROBIOTIC DAILY) CAPS Take 1 capsule by mouth daily.   [name]  ranitidine (ZANTAC) 300 MG tablet TAKE 1 TABLET BY MOUTH AT BEDTIME   . Turmeric 500 MG CAPS Take 1,000 mg by mouth.    No facility-administered encounter medications on file as of 03/09/2018.     Functional Status:   In your present state of health, do you have any difficulty performing the following activities: 12/14/2017  Hearing? Y  Comment pt has a prescriptionf or a hearing aide pending approved for hearing aides  Vision? N  Difficulty concentrating or making decisions? Y  Walking or climbing stairs? Y  Dressing or bathing? N  Doing errands, shopping? Y  Comment Sometimes to the neurologisit-[facility] social worker  assisting with transportion services on these appointed days.  Preparing Food and eating ? Y  Using the Toilet? N  In the past six months, have you accidently leaked urine? Y  Do you have problems with loss of bowel control? Y  Comment IBS but this has improved with ongoing visits to [facility] provider  Managing your Medications? N  Managing your Finances? N  Housekeeping or managing your Housekeeping? Y  Some recent data might be hidden    Fall/Depression Screening:    Fall Risk  12/14/2017 11/24/2017 11/08/2017  Falls in the past year? Yes Yes Yes  Number falls in past yr: 2 or more 2 or more 2 or more  Injury with Fall? No Yes (No Data)  Comment - - bruises  Risk Factor Category  - High Fall Risk -  Risk for fall due to : Impaired balance/gait;History of fall(s) History of fall(s);Impaired balance/gait Impaired balance/gait  Follow up Falls prevention discussed - -   PHQ 2/9 Scores 12/14/2017 11/24/2017 12/23/2016 12/13/2016  PHQ - 2 Score 0 0 0 1   BP 120/60 (BP Location: Left Arm, Patient Position: Sitting, Cuff Size: Normal)   Pulse 81   Resp 20   Ht 1.524 m (5')   Wt 140 lb (63.5 kg)   SpO2 94%   BMI 27.34 kg/m  Assessment:   Ongoing follow with case management related to respiratory issues Follow up related to history of falls DME related to rollator  Plan:  Will pt continues to manage her ongoing respiratory issues with use of her ongoing inhalers with no reported issues with difficulty breathing or SOB incident. Will continue to encourage adherence with use of her medications to manage her breathing and prevent hypoxia. Will verify any injuries related to recent fall. Will inquire why pt does not use the available rollator that remains in the home. Will strongly encouraged pt's once again to use of available DME walker/rollator to prevent risk of future falls.  Will strongly pt to use all assisted devices to avoid acute falls that may result to injuries.  Plan of care will be  discussed and intervention adjusted accordingly to allow ongoing adherence and pt understanding of managing the above issues. Will discuss completing a telephonic follow up next month with plans to discharge if pt remains adherent with managing her ongoing issues.   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  [facility]N Long Term Goal Start Date  12/14/17  [facility] Long Term Goal Met  Date  03/09/18    [facility] CM Care Plan Problem Three     Most Recent Value  Care Plan Problem Three  Fall prevention  Role Documenting the Problem Three  Care Management Coordinator  Care Plan for Problem Three  Active  [facility] CM Short Term Goal #1   Pt will adherence to use of  DME available over the next 30days  [facility]N CM Short Term Goal #1 Start Date  02/02/18  Interventions for Short Term Goal #1  Will verify any falls and again encouraged pt of the risk of injuries related to falls. Will encouraged use of DME bothe inside and outside the home to avoid falls. Will extend to once again allow adherence with the new rollator pt recieved over the last month.       [name], RN Care Management Coordinator [place] Office [phone]

## 2018-03-16 ENCOUNTER — Ambulatory Visit (INDEPENDENT_AMBULATORY_CARE_PROVIDER_SITE_OTHER): Payer: PPO | Admitting: Neurology

## 2018-03-16 ENCOUNTER — Encounter: Payer: Self-pay | Admitting: Neurology

## 2018-03-16 ENCOUNTER — Other Ambulatory Visit: Payer: Self-pay

## 2018-03-16 VITALS — BP 169/59 | HR 58 | Resp 22 | Ht 60.0 in | Wt 145.0 lb

## 2018-03-16 DIAGNOSIS — R799 Abnormal finding of blood chemistry, unspecified: Secondary | ICD-10-CM | POA: Diagnosis not present

## 2018-03-16 DIAGNOSIS — G250 Essential tremor: Secondary | ICD-10-CM

## 2018-03-16 DIAGNOSIS — R269 Unspecified abnormalities of gait and mobility: Secondary | ICD-10-CM

## 2018-03-16 DIAGNOSIS — F0390 Unspecified dementia without behavioral disturbance: Secondary | ICD-10-CM | POA: Diagnosis not present

## 2018-03-16 DIAGNOSIS — F03A0 Unspecified dementia, mild, without behavioral disturbance, psychotic disturbance, mood disturbance, and anxiety: Secondary | ICD-10-CM

## 2018-03-16 DIAGNOSIS — E538 Deficiency of other specified B group vitamins: Secondary | ICD-10-CM | POA: Diagnosis not present

## 2018-03-16 HISTORY — DX: Essential tremor: G25.0

## 2018-03-16 MED ORDER — DONEPEZIL HCL 10 MG PO TABS: 10.0000 mg | ORAL_TABLET | Freq: Every day | ORAL | 3 refills | Status: DC

## 2018-03-16 NOTE — Progress Notes (Signed)
Reason for visit: Gait disorder, memory disorder  Tammy [name] is a 84 y.o. female  History of present illness:  Tammy [name] is an 84 year old right-handed white female with a history of a memory disorder that dates back at least to 2013.  The patient currently lives alone, she operates a motor vehicle without difficulty.  The patient comes in today with her daughter who confirms that the patient has no problems with directions, she has not had any safety issues with driving.  The patient was able to tell her daughter how to get to our office today accurately.  The patient manages her own finances without difficulty, her daughter comes over it and loads the pill dispenser, the patient takes her own medications.  The patient is able to cook for herself, she is able to manage her own appointments, she has a calendar for this.  The patient uses a cane for ambulation, she is falling with some regularity, she fell and fractured some ribs on 21 February 2018, she was in the emergency room on 01 February 2018 with a fall.  The patient uses a cane outside of the house, not in the house, her house is on one level.  The patient denies any dizziness, she reports some numbness of the hands but not of the feet.  She denies any neck pain or low back pain.  The patient is on Aricept, she tolerates the medication fairly well.  The patient comes to this office for an evaluation.  The patient has a history of an essential tremor since she was 84 years old.  This affects the head and neck, voice, and arms.  Past Medical History:  Diagnosis Date  . Acid reflux   . Asthma    Asthmatic COPD  . Atrial fibrillation ([place]) 01/01/2016  . Coronary artery disease    Echo 07/27/2011   . Coronary artery stenosis    , High-grade left main  . Dementia ([place])   . GERD (gastroesophageal reflux disease)   . Hyperlipidemia   . Hypertension   . IBS (irritable bowel syndrome)   . Psoriasis     Past Surgical History:    Procedure Laterality Date  . CARDIAC SURGERY    . CORONARY ARTERY BYPASS GRAFT  1994   After catheterization showed 90% ostial left main stenosis  . DOPPLER ECHOCARDIOGRAPHY     Study showed mild to moderate MR with moderate TR, mild to moderate pulmonary hypertension with an ejection fraction greater that 55%.   [name] EYE SURGERY    . Nuclear Perfusion  10/2010, 11/15/2012   Showed normal perfusion, No Ischemia or Infarction, Normal EF  . Renal Scan Duplex  04/09/2012   Showing greater than 50% diameter reduction in the celiac artery and SMA.. Right proximal 275 and Mid 152.  [name] ROTATOR CUFF REPAIR     bilateral  . Sleep Study  12/28/2006   AHI during total sleep time (3h 19 minutes) was 1.81/hr and during REM sleep at 17.78/hr. Mild sleep apnea during REM sleep.Oxygen staturated rate during REM and NREM was 93.0%.    Family History  Problem Relation Age of Onset  . Heart disease Mother   . Hypertension Sister   . Colon cancer Brother   . Cancer Brother     Social history:  reports that she quit smoking about 55 years ago. She has a 18.00 pack-year smoking history. She has never used smokeless tobacco. She reports that she does not drink alcohol or  use drugs.  Medications:  Prior to Admission medications   Medication Sig Start Date End Date Taking? Authorizing Provider  Acetaminophen (TYLENOL ARTHRITIS EXT RELIEF PO) Take by mouth.    [provider]  albuterol (PROVENTIL HFA;VENTOLIN HFA) 108 (90 Base) MCG/ACT inhaler INHALE 2 PUFFS BY MOUTH EVERY 4 TO 6 HOURS AS NEEDED FOR COUGH OR WHEEZING 02/09/18   Kozlow, Donnamarie Poag, MD  aspirin 81 MG tablet Take 81 mg by mouth daily.     [provider]  atorvastatin (LIPITOR) 10 MG tablet Take 1 tablet (10 mg total) by mouth at bedtime. 01/06/16   Rai, Ripudeep K, MD  donepezil (ARICEPT) 10 MG tablet Take 1 tablet (10 mg total) by mouth daily. 07/13/14   Marcial Pacas, MD  DULoxetine (CYMBALTA) 60 MG capsule Take 60 mg by mouth daily.  Reported on 08/05/2015 09/13/14   [provider]  fluticasone (FLONASE) 50 MCG/ACT nasal spray Place 2 sprays into both nostrils daily.    [provider]  fluticasone furoate-vilanterol (BREO ELLIPTA) 200-25 MCG/INH AEPB Inhale 1 puff into the lungs daily. 02/09/18   Kozlow, Donnamarie Poag, MD  folic acid (FOLVITE) 1 MG tablet Take 1 tablet (1 mg total) by mouth daily. 01/06/16   Rai, Ripudeep K, MD  irbesartan (AVAPRO) 150 MG tablet Take 150 mg by mouth daily.    [provider]  metoprolol tartrate (LOPRESSOR) 50 MG tablet Take 50 mg by mouth 2 (two) times daily. Pt reports she is taking this medication    [provider]  omeprazole (PRILOSEC) 20 MG capsule Take 20 mg by mouth 2 (two) times daily.  08/07/16   [provider]  Probiotic Product (PROBIOTIC DAILY) CAPS Take 1 capsule by mouth daily.    [provider]  ranitidine (ZANTAC) 300 MG tablet TAKE 1 TABLET BY MOUTH AT BEDTIME 05/16/17   [name], MD  Turmeric 500 MG CAPS Take 1,000 mg by mouth.    [provider]      Allergies  Allergen Reactions  . Penicillins Hives    Has patient had a PCN reaction causing immediate rash, facial/tongue/throat swelling, SOB or lightheadedness with hypotension: No Has patient had a PCN reaction causing severe rash involving mucus membranes or skin necrosis: Yes Has patient had a PCN reaction that required hospitalization No Has patient had a PCN reaction occurring within the last 10 years: Yes took again last year 2016 If all of the above answers are "NO", then may proceed with Cephalosporin use.   . Naprosyn [Naproxen] Nausea Only    diarrhea  . Sertraline Diarrhea  . Lactose Intolerance (Gi) Other (See Comments)    Unknown- [facility] documented    ROS:  Out of a complete 14 system review of symptoms, the patient complains only of the following symptoms, and all other reviewed systems are negative.  Walking  difficulty Tremor  Blood pressure (!) 169/59, pulse (!) 58, resp. rate (!) 22, height 5' (1.524 m), weight 145 lb (65.8 kg).  Physical Exam  General: The patient is alert and cooperative at the time of the examination.  The patient is moderately obese.  Eyes: Pupils are equal, round, and reactive to light. Discs are flat bilaterally.  Neck: The neck is supple, no carotid bruits are noted.  Respiratory: The respiratory examination is clear.  Cardiovascular: The cardiovascular examination reveals a regular rate and rhythm, no obvious murmurs or rubs are noted.  Skin: Extremities are without significant edema.  Neurologic Exam  Mental status: The patient is alert and oriented x 3 at the time of the examination. The Mini-Mental status examination done today shows a total score of 21/30.  The patient is able to name 10 animals in 1 minute.  Cranial nerves: Facial symmetry is present. There is good sensation of the face to pinprick and soft touch bilaterally. The strength of the facial muscles and the muscles to head turning and shoulder shrug are normal bilaterally. Speech is well enunciated, no aphasia or dysarthria is noted. Extraocular movements are full. Visual fields are full. The tongue is midline, and the patient has symmetric elevation of the soft palate. No obvious hearing deficits are noted.  Motor: The motor testing reveals 5 over 5 strength of all 4 extremities. Good symmetric motor tone is noted throughout.  Sensory: Sensory testing is intact to pinprick, soft touch, vibration sensation, and position sense on the upper extremities.  With the lower extremities there is a stocking pattern pinprick sensory deficit up to the knees bilaterally, the patient has significant impairment of vibration and position sense in both feet.  No evidence of extinction is noted.  Coordination: Cerebellar testing reveals good finger-nose-finger and heel-to-shin bilaterally.  The patient has intention  tremors with finger-nose-finger bilaterally.  Some apraxia with use of the lower extremities is seen with heel-to-shin.  Gait and station: Gait is wide-based, the patient is able to walk independently with good arm swing bilaterally.  Normally, she uses a cane for ambulation.  Tandem gait is unsteady.  Romberg is negative but is unsteady.  Reflexes: Deep tendon reflexes are symmetric in the arms, there is an elevation of the right knee jerk reflex as compared to the left, ankle jerk reflexes are depressed bilaterally.  Toes are downgoing bilaterally.   MRI brain 11/16/17:  IMPRESSION:   Abnormal MRI brain (without) demonstrating: - Moderate frontal, parietal, perisylvian and mesial temporal atrophy.  - Mild periventricular and subcortical foci of non-specific gliosis.  - No acute findings. - Compared to MRI on 02/16/16, there has been slight progression of atrophy.   * MRI scan images were reviewed online. I agree with the written report.    Assessment/Plan:  1.  Mild memory disturbance  2.  Gait disorder  3.  Essential tremor  4.  Possible peripheral neuropathy  The patient has had a very slow progression of memory over several years.  The patient will remain on Aricept, a prescription will be sent in today.  The patient is operating motor vehicle, this activity does need to be scrutinized closely, if the patient begins having troubles with directions or with safety with driving, the driving will need to be stopped.  The patient appears to have a gait disorder that may in part be related to her history of essential tremor.  I see no evidence of Parkinson's disease or parkinsonism.  The patient likely has a peripheral neuropathy, she has significant impairment of vibration and position sense in the feet that likely is adding to the walking problems.  She will be going for physical therapy in the near future.  The patient will follow-up in 6 months.  Jill Alexanders MD 03/16/2018 8:49  AM  [facility] [address] [place],  [address]  Phone [phone] Fax [phone]

## 2018-03-21 ENCOUNTER — Telehealth: Payer: Self-pay | Admitting: *Deleted

## 2018-03-21 LAB — MULTIPLE MYELOMA PANEL, SERUM
Albumin SerPl Elph-Mcnc: 3.6 g/dL (ref 2.9–4.4)
Albumin/Glob SerPl: 1.4 (ref 0.7–1.7)
Alpha 1: 0.3 g/dL (ref 0.0–0.4)
Alpha2 Glob SerPl Elph-Mcnc: 1 g/dL (ref 0.4–1.0)
B-Globulin SerPl Elph-Mcnc: 0.9 g/dL (ref 0.7–1.3)
Gamma Glob SerPl Elph-Mcnc: 0.6 g/dL (ref 0.4–1.8)
Globulin, Total: 2.7 g/dL (ref 2.2–3.9)
IgA/Immunoglobulin A, Serum: 268 mg/dL (ref 64–422)
IgG (Immunoglobin G), Serum: 742 mg/dL (ref 700–1600)
IgM (Immunoglobulin M), Srm: 74 mg/dL (ref 26–217)
Total Protein: 6.3 g/dL (ref 6.0–8.5)

## 2018-03-21 LAB — B. BURGDORFI ANTIBODIES: Lyme IgG/IgM Ab: <0.91 {ISR} (ref 0.00–0.90)

## 2018-03-21 LAB — ANA W/REFLEX: Anti Nuclear Antibody(ANA): NEGATIVE

## 2018-03-21 LAB — ANGIOTENSIN CONVERTING ENZYME: Angio Convert Enzyme: 51 U/L (ref 14–82)

## 2018-03-21 LAB — SEDIMENTATION RATE: Sed Rate: 13 mm/hr (ref 0–40)

## 2018-03-21 LAB — COPPER, SERUM: Copper: 167 ug/dL — ABNORMAL HIGH (ref 72–166)

## 2018-03-21 LAB — VITAMIN B12: Vitamin B-12: 639 pg/mL (ref 232–1245)

## 2018-03-21 NOTE — Telephone Encounter (Signed)
-----   Message from Kathrynn Ducking, MD sent at 03/21/2018 11:40 AM EST -----  The blood work results are unremarkable. Please call the patient.  ----- Message ----- From: Lavone Neri Lab Results In Sent: 03/17/2018   7:39 AM EST To: Kathrynn Ducking, MD

## 2018-03-21 NOTE — Telephone Encounter (Signed)
Spoke with pt.and advised that lab work done in our office was ok--Dr. Jannifer Franklin did not note any concerns with it. She verbalized understanding of same/fim

## 2018-04-07 ENCOUNTER — Other Ambulatory Visit: Payer: Self-pay | Admitting: *Deleted

## 2018-04-07 NOTE — Patient Outreach (Signed)
[facility]) Care Management  04/07/2018  [name] 1931/04/30 [id number]   RN spoke with pt today and verified identifiers and inquired further on pt's ongoing management of care. Pt reports no falls and she continue to use her DME when needed. RN review the goals and plan of care as pt has met all the goals over the last few months. Based upon pt's progress and all needs met at this time. Will continue to encouraged adherence with managing her care in and outside the home with safety measures in place with available DME to increased risk for falls/injuries.   Plans are to discharge pt today and alert pt's primary provider of pt's disposition with [facility]. No other needs or issues at address at this time. Case will be closed.  [name], RN Care Management Coordinator [place] Office [phone]

## 2018-04-17 ENCOUNTER — Encounter: Payer: Self-pay | Admitting: Cardiovascular Disease

## 2018-04-17 ENCOUNTER — Ambulatory Visit (INDEPENDENT_AMBULATORY_CARE_PROVIDER_SITE_OTHER): Payer: PPO | Admitting: Cardiovascular Disease

## 2018-04-17 VITALS — BP 162/54 | HR 72 | Ht 60.0 in | Wt 150.2 lb

## 2018-04-17 DIAGNOSIS — R413 Other amnesia: Secondary | ICD-10-CM

## 2018-04-17 DIAGNOSIS — R600 Localized edema: Secondary | ICD-10-CM | POA: Diagnosis not present

## 2018-04-17 DIAGNOSIS — K219 Gastro-esophageal reflux disease without esophagitis: Secondary | ICD-10-CM

## 2018-04-17 DIAGNOSIS — I10 Essential (primary) hypertension: Secondary | ICD-10-CM

## 2018-04-17 DIAGNOSIS — E785 Hyperlipidemia, unspecified: Secondary | ICD-10-CM | POA: Diagnosis not present

## 2018-04-17 DIAGNOSIS — I272 Pulmonary hypertension, unspecified: Secondary | ICD-10-CM | POA: Diagnosis not present

## 2018-04-17 DIAGNOSIS — I25119 Atherosclerotic heart disease of native coronary artery with unspecified angina pectoris: Secondary | ICD-10-CM

## 2018-04-17 MED ORDER — FUROSEMIDE 20 MG PO TABS: 20.0000 mg | ORAL_TABLET | Freq: Every day | ORAL | 3 refills | Status: DC

## 2018-04-17 NOTE — Patient Instructions (Signed)
Medication Instructions:  START furosemide (Lasix) 20 mg daily  If you need a refill on your cardiac medications before your next appointment, please call your pharmacy.   Follow-Up: At [facility], you and your health needs are our priority.  As part of our continuing mission to provide you with exceptional heart care, we have created designated Provider Care Teams.  These Care Teams include your primary Cardiologist (physician) and Advanced Practice Providers (APPs -  Physician Assistants and Nurse Practitioners) who all work together to provide you with the care you need, when you need it. You will need a follow up appointment in 6 months.  Please call our office 2 months in advance to schedule this appointment.  You may see Shelva Majestic, MD or one of the following Advanced Practice Providers on your designated Care Team: [place], [place] . Fabian Sharp, PA-C  Any Other Special Instructions Will Be Listed Below (If Applicable). **Follow up with Dr. Harrington Challenger

## 2018-04-17 NOTE — Progress Notes (Signed)
Patient ID: Tammy [name], female   DOB: 03/02/1931, 84 y.o.   MRN: [id number]     Primary M.D.: Gus Height  HPI: Tammy [name] is a 84 y.o. female who presents to the office today for an 8 month followup cardiology evaluation.   Tammy [name] has established CAD andunderwent CABG surgery in 1994 after she was found to have high-grade left main stenosis. A nuclear perfusion study in June 2011 showed normal perfusion. An echo Doppler study in March 2013 showed mild-to-moderate mitral regurgitation, moderate tricuspid regurgitation, mild-to-moderate pulmonary hypertension with estimated RV systolic pressure 45 mm, mild to moderate LA dilatation, aortic sclerosis/calcification, and an ejection fraction greater than 55%. She has documented right renal artery stenosis on renal duplex scanning with the last scan in November 2013 suggesting a equal or greater than 60% diameter reduction by velocity in the right renal artery. The kidneys are otherwise normal in size, symmetrical in shape and had normal cortex and medulla. She does have a history of  hypertension and hyperlipidemia. In the past she also has had some issues with cellulitis of her lower extremity.   A two-year followup nuclear perfusion study on 11/16/2011; no significant EKG changes were demonstrated. The study was low risk and did not show any area of scar or infarction. This is unchanged from 2 years previously.  She has had issues with lower extremity edema and this improved with reduction of her amlodipine dose to 5 mg from 10 mg. A follow-up echo Doppler study continued to show normal systolic function.  There was mild left atrial dilatation and mild pulmonary hypertension with a PA pressure slightly improved from previously at 38 mm.  She did not have significant valvular abnormality.  A follow-up renal duplex study was unchanged from her prior study of one year previously and showed equal to less than 6% diameter reduction in the right renal  artery.  She had normal patency to the left renal artery.  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   She underwent a follow-up nuclear perfusion study in July 2016.  This was normal without scar or ischemia.  Ejection fraction was 74% and she had normal wall motion.  She did not develop any ECG changes.  She was hospitalized September and in October 2017.  She had episodes of lightheadedness upon standing, nausea, lower abdominal pain and confusion.  He was found to have renal failure acutely and was treated with intravenous fluids.  He had increased to 1.74.  He has a history of atrial fibrillation but was felt not to be a candidate for anticoagulation due to fall risk.  Her beta blocker had been discontinued due to bradycardia.  When she was admitted to [facility] from October 5 through 02/22/2016 she was treated with Cardizem.  She had an unremarkable CT of her head and neck.  She broke her wrist before Christmas.  She has been followed by Dr. Gus Height, for primary care.   When I saw her in October 2018 her blood pressure was elevated. In the past, she apparently had some issues with diltiazem.  She was back on metoprolol.  With her continued blood pressure elevation.  I suggested the addition of amlodipine 5 mg.  Apparently there is no record that this ever was filled.  She is no longer fully aware of the medicines she is taking since her daughter provides her medicine for her.  She was on metoprolol tartrate 50 mg twice a day,  irbesartan 150 mg daily for hypertension, low-dose atorvastatin 10 mg for hyperlipidemia.  She is on Aricept.  There is a history of depression for which she's on Cymbalta.  She denied recent wheezing on when necessary albuterol.  In addition to breo-ellipta.    I last saw her, she has had some confusion with her medications.  He is not taking amlodipine.  She is on Erbe Sartain 150 mg daily, metoprolol 50 mg twice a day for blood pressure.  At times she  admits to leg swelling.  She picks at her legs leading to excess significant excoriations to her lower extremity.  She continues to take Cymbalta for depression.  She has some dementia and is on Aricept.  She denies recent wheezing on Breo Ellipta.  She presents for evaluation.  Past Medical History:  Diagnosis Date  . Acid reflux   . Asthma    Asthmatic COPD  . Atrial fibrillation ([place]) 01/01/2016  . Coronary artery disease    Echo 07/27/2011   . Coronary artery stenosis    , High-grade left main  . Dementia ([place])   . Essential tremor 03/16/2018  . GERD (gastroesophageal reflux disease)   . Hyperlipidemia   . Hypertension   . IBS (irritable bowel syndrome)   . Psoriasis     Past Surgical History:  Procedure Laterality Date  . CARDIAC SURGERY    . CORONARY ARTERY BYPASS GRAFT  1994   After catheterization showed 90% ostial left main stenosis  . DOPPLER ECHOCARDIOGRAPHY     Study showed mild to moderate MR with moderate TR, mild to moderate pulmonary hypertension with an ejection fraction greater that 55%.   [name] EYE SURGERY    . Nuclear Perfusion  10/2010, 11/15/2012   Showed normal perfusion, No Ischemia or Infarction, Normal EF  . Renal Scan Duplex  04/09/2012   Showing greater than 50% diameter reduction in the celiac artery and SMA.. Right proximal 275 and Mid 152.  [name] ROTATOR CUFF REPAIR     bilateral  . Sleep Study  12/28/2006   AHI during total sleep time (3h 19 minutes) was 1.81/hr and during REM sleep at 17.78/hr. Mild sleep apnea during REM sleep.Oxygen staturated rate during REM and NREM was 93.0%.    Allergies  Allergen Reactions  . Penicillins Hives    Has patient had a PCN reaction causing immediate rash, facial/tongue/throat swelling, SOB or lightheadedness with hypotension: No Has patient had a PCN reaction causing severe rash involving mucus membranes or skin necrosis: Yes Has patient had a PCN reaction that required hospitalization No Has patient had a PCN  reaction occurring within the last 10 years: Yes took again last year 2016 If all of the above answers are "NO", then may proceed with Cephalosporin use.   . Naprosyn [Naproxen] Nausea Only    diarrhea  . Sertraline Diarrhea  . Lactose Intolerance (Gi) Other (See Comments)    Unknown- [facility] of [facility] documented    Current Outpatient Medications  Medication Sig Dispense Refill  . Acetaminophen (TYLENOL ARTHRITIS EXT RELIEF PO) Take by mouth.    [name] albuterol (PROVENTIL HFA;VENTOLIN HFA) 108 (90 Base) MCG/ACT inhaler INHALE 2 PUFFS BY MOUTH EVERY 4 TO 6 HOURS AS NEEDED FOR COUGH OR WHEEZING 1 each 1  . aspirin 81 MG tablet Take 81 mg by mouth daily.     [name] atorvastatin (LIPITOR) 10 MG tablet Take 1 tablet (10 mg total) by mouth at bedtime.    . donepezil (ARICEPT) 10  MG tablet Take 1 tablet (10 mg total) by mouth daily. 90 tablet 3  . DULoxetine (CYMBALTA) 60 MG capsule Take 60 mg by mouth daily. Reported on 08/05/2015  2  . fluticasone (FLONASE) 50 MCG/ACT nasal spray Place 2 sprays into both nostrils daily.    . fluticasone furoate-vilanterol (BREO ELLIPTA) 200-25 MCG/INH AEPB Inhale 1 puff into the lungs daily. 60 each 5  . folic acid (FOLVITE) 1 MG tablet Take 1 tablet (1 mg total) by mouth daily.    . irbesartan (AVAPRO) 150 MG tablet Take 150 mg by mouth daily.    . metoprolol tartrate (LOPRESSOR) 50 MG tablet Take 50 mg by mouth 2 (two) times daily. Pt reports she is taking this medication    . omeprazole (PRILOSEC) 20 MG capsule Take 20 mg by mouth 2 (two) times daily.     . Probiotic Product (PROBIOTIC DAILY) CAPS Take 1 capsule by mouth daily.    . ranitidine (ZANTAC) 300 MG tablet TAKE 1 TABLET BY MOUTH AT BEDTIME 90 tablet 1  . Turmeric 500 MG CAPS Take 1,000 mg by mouth.    . furosemide (LASIX) 20 MG tablet Take 1 tablet (20 mg total) by mouth daily. 90 tablet 3   No current facility-administered medications for this visit.     Socially , she is divorced.  She  denies any recent tobacco use. She has not been able to exercise regularly. pressu ROS General: Negative; No fevers, chills, or night sweats; was in for fatigue. HEENT: Negative; No changes in vision or hearing, sinus congestion, difficulty swallowing Pulmonary: Negative; No cough, wheezing, shortness of breath, hemoptysis Cardiovascular: See history of present illness;  GI: Positive for diverticulitis, and frequent diarrhea;  GU: Positive for renal artery stenosis; No dysuria, hematuria, or difficulty voiding Musculoskeletal: Right wrist fracture. Hematologic/Oncology: Negative; no easy bruising, bleeding Endocrine: Negative; no heat/cold intolerance; no diabetes Neuro: Positive for memory loss, now on Aricept Skin: Negative; No rashes or skin lesions Psychiatric: Negative; No behavioral problems, depression Sleep: Negative; No snoring, daytime sleepiness, hypersomnolence, bruxism, restless legs, hypnogognic hallucinations, no cataplexy Other comprehensive 14 point system review is negative.   PE BP (!) 162/54   Pulse 72   Ht 5' (1.524 m)   Wt 150 lb 3.2 oz (68.1 kg)   BMI 29.33 kg/m    Repeat blood pressure was elevated at 160/74.  Wt Readings from Last 3 Encounters:  04/17/18 150 lb 3.2 oz (68.1 kg)  03/16/18 145 lb (65.8 kg)  03/09/18 140 lb (63.5 kg)   General: Alert, oriented, no distress.  Skin: normal turgor, no rashes, warm and dry HEENT: Normocephalic, atraumatic. Pupils equal round and reactive to light; sclera anicteric; extraocular muscles intact;  Nose without nasal septal hypertrophy Mouth/Parynx benign; Mallinpatti scale 3 Neck: No JVD, no carotid bruits; normal carotid upstroke Lungs: clear to ausculatation and percussion; no wheezing or rales Chest wall: without tenderness to palpitation Heart: PMI not displaced, RRR, s1 s2 normal, 1/6 systolic murmur, no diastolic murmur, no rubs, gallops, thrills, or heaves Abdomen: soft, nontender; no  hepatosplenomehaly, BS+; abdominal aorta nontender and not dilated by palpation. Back: no CVA tenderness Pulses 2+ Musculoskeletal: full range of motion, normal strength, no joint deformities Extremities: Lower extremity edema with numerous excoriations and areas of redness which he states resulted from picking at her legs.  No clubbing cyanosis or edema, Homan's sign negative  Neurologic: grossly nonfocal; Cranial nerves grossly wnl Psychologic: Normal mood and affect  ECG (independently read by me):  Normal sinus rhythm at 72 bpm.  No significant ST-T changes.  Normal intervals.  April 2019 ECG (independently read by me): normal sinus rhythm at 63 bpm.  No ectopy.  Normal intervals.  October 2018 ECG (independently read by me): Normal sinus rhythm at 87 bpm.  Nonspecific ST changes.  QTc interval 450 ms.  March 2018 ECG (independently read by me): Normal sinus rhythm at 78 bpm.  No ST segment changes.  Normal intervals.  April 2017 ECG (independently read by me): Normal sinus rhythm at 68 bpm.  Normal intervals.  No ST segment changes.  July 2016 ECG (independently read by me): Normal sinus rhythm at 70 bpm.  Nonspecific ST changes.  PR interval 178 ms.  QTc interval 429 ms.  November 02, 2013 ECG (independently read by me): Sinus bradycardia 55 beats per minute.  Nonspecific ST-T changes.  QTc interval 430 ms.  Borderline first degree AV block with a PR interval of 202 ms.  Prior September 2014 ECG: Normal sinus rhythm at 68 beats per minute. One isolated PVC. Nonspecific ST changes.  LABS:  BMP Latest Ref Rng & Units 12/09/2016 12/08/2016 02/21/2016  Glucose 65 - 99 mg/dL 133(H) 96 108(H)  BUN 6 - 20 mg/dL 11 14 11   Creatinine 0.44 - 1.00 mg/dL 0.99 0.88 0.84  Sodium 135 - 145 mmol/L 137 136 139  Potassium 3.5 - 5.1 mmol/L 3.7 4.4 3.6  Chloride 101 - 111 mmol/L 107 103 111  CO2 22 - 32 mmol/L 22 23 22   Calcium 8.9 - 10.3 mg/dL 8.3(L) 8.7(L) 8.7(L)   Hepatic Function Latest Ref Rng &  Units 03/16/2018 12/08/2016 02/21/2016  Total Protein 6.0 - 8.5 g/dL 6.3 6.5 5.7(L)  Albumin 3.5 - 5.0 g/dL - 3.3(L) 2.9(L)  AST 15 - 41 U/L - 22 24  ALT 14 - 54 U/L - 18 33  Alk Phosphatase 38 - 126 U/L - 78 75  Total Bilirubin 0.3 - 1.2 mg/dL - 0.4 0.4  Bilirubin, Direct 0.1 - 0.5 mg/dL - - -   CBC Latest Ref Rng & Units 12/09/2016 12/08/2016 02/21/2016  WBC 4.0 - 10.5 K/uL 10.9(H) 13.5(H) 9.7  Hemoglobin 12.0 - 15.0 g/dL 11.3(L) 12.3 10.4(L)  Hematocrit 36.0 - 46.0 % 36.4 39.1 33.9(L)  Platelets 150 - 400 K/uL 260 317 275   Lab Results  Component Value Date   MCV 82.4 12/09/2016   MCV 82.5 12/08/2016   MCV 91.6 02/21/2016   Lab Results  Component Value Date   TSH 1.963 02/20/2016   Lab Results  Component Value Date   HGBA1C  07/12/2009    5.3 (NOTE) The ADA recommends the following therapeutic goal for glycemic control related to Hgb A1c measurement: Goal of therapy: <6.5 Hgb A1c  Reference: American Diabetes Association: Clinical Practice Recommendations 2010, Diabetes Care, 2010, 33: (Suppl  1).   Lipid Panel     Component Value Date/Time   CHOL 118 01/03/2016 0304   CHOL 128 11/09/2012 0805   TRIG 106 01/03/2016 0304   TRIG 84 11/09/2012 0805   HDL 43 01/03/2016 0304   HDL 50 11/09/2012 0805   CHOLHDL 2.7 01/03/2016 0304   VLDL 21 01/03/2016 0304   LDLCALC 54 01/03/2016 0304   LDLCALC 61 11/09/2012 0805    RADIOLOGY: No results found.  IMPRESSION:  1. Essential hypertension   2. Hyperlipidemia with target LDL less than 70   3. Pulmonary hypertension ([place])   4. Coronary artery disease involving native coronary artery of native heart  with angina pectoris ([place])   5. Bilateral leg edema   6. Memory loss   7. Gastroesophageal reflux disease without esophagitis     ASSESSMENT AND PLAN: Ms. Betty is an 83 year old white female who has CAD and underwent CABG revascularization surgery in 1994. She has had issues with significant fatigue and mild shortness of  breath with activity.  An echo Doppler study in June 2015 was not significantly change from previously.  PA pressure was elevated, but improved at 38 mm compared to 45 mmHg in 2013.  A nuclear perfusion study in July 2016 continued to show normal perfusion without scar or ischemia.  Her last echo Doppler study in May 2017 showed normal LV size with mild focal basal septal hypertrophy.  EF was 60-65%.  She had normal RV size and systolic function and there were no significant valvular abnormalities.  Blood pressure today is elevated and apparently she is only on irbesartan 150 mg in addition to metoprolol 50 mg twice a day for blood pressure control.  She may have been put back on amlodipine but this may have been stopped perhaps secondary to edema.  She has leg swelling today and exam.  I am adding furosemide 20 mg which will be helpful both for her lower extremity edema in addition to her blood pressure control.  If blood pressure continues to be elevated and if her renal function remained stable further titration of irbesartan may be necessary.  Presently, she is not having any anginal symptoms with reference to her CAD.  Her asthma seems to be well controlled on Brio Ellipta and there is not any recent history of wheezing.  She has GERD for which she takes omeprazole in addition to Zantac.  She has a history of depression currently on Cymbalta.  There is dementia on Aricept.  I will see her in   Time spent: 25 minutes [name], MD, [facility]  04/19/2018 8:02 PM

## 2018-04-19 ENCOUNTER — Encounter: Payer: Self-pay | Admitting: Cardiovascular Disease

## 2018-04-21 DIAGNOSIS — L03115 Cellulitis of right lower limb: Secondary | ICD-10-CM | POA: Diagnosis not present

## 2018-04-28 DIAGNOSIS — L400 Psoriasis vulgaris: Secondary | ICD-10-CM | POA: Diagnosis not present

## 2018-04-28 DIAGNOSIS — L0390 Cellulitis, unspecified: Secondary | ICD-10-CM | POA: Diagnosis not present

## 2018-04-28 DIAGNOSIS — K219 Gastro-esophageal reflux disease without esophagitis: Secondary | ICD-10-CM | POA: Diagnosis not present

## 2018-04-28 DIAGNOSIS — R609 Edema, unspecified: Secondary | ICD-10-CM | POA: Diagnosis not present

## 2018-06-06 ENCOUNTER — Ambulatory Visit: Payer: PPO | Admitting: Nurse Practitioner

## 2018-07-10 DIAGNOSIS — M545 Low back pain: Secondary | ICD-10-CM | POA: Diagnosis not present

## 2018-07-10 DIAGNOSIS — M5136 Other intervertebral disc degeneration, lumbar region: Secondary | ICD-10-CM | POA: Diagnosis not present

## 2018-07-10 DIAGNOSIS — M48061 Spinal stenosis, lumbar region without neurogenic claudication: Secondary | ICD-10-CM | POA: Diagnosis not present

## 2018-07-11 ENCOUNTER — Ambulatory Visit: Payer: PPO | Admitting: Allergy and Immunology

## 2018-07-11 ENCOUNTER — Encounter: Payer: Self-pay | Admitting: Allergy and Immunology

## 2018-07-11 VITALS — BP 134/72 | HR 66 | Resp 18

## 2018-07-11 DIAGNOSIS — K219 Gastro-esophageal reflux disease without esophagitis: Secondary | ICD-10-CM | POA: Diagnosis not present

## 2018-07-11 DIAGNOSIS — J4550 Severe persistent asthma, uncomplicated: Secondary | ICD-10-CM

## 2018-07-11 DIAGNOSIS — R0902 Hypoxemia: Secondary | ICD-10-CM | POA: Diagnosis not present

## 2018-07-11 DIAGNOSIS — J3089 Other allergic rhinitis: Secondary | ICD-10-CM | POA: Diagnosis not present

## 2018-07-11 NOTE — Progress Notes (Signed)
Follow-up Note  Referring Provider: [name], MD Primary Provider: [name], MD Date of Office Visit: 07/11/2018  Subjective:   Tammy [name] (DOB: 12-Jan-1931) is a 85 y.o. female who returns to the [facility] on 07/11/2018 in re-evaluation of the following:  HPI: Allysa presents to this clinic in evaluation of COPD with asthma, allergic rhinitis, and history of LPR.  I last saw her in this clinic on 10 January 2018.  Overall she has done well with her breathing.  But she is out of breath whenever she exerts herself.  She does not really use a short acting bronchodilator greater than 1 time per week.  She continues to use Breo.  She apparently has not required a systemic steroid or an antibiotic to treat any type of respiratory issue.  She has issues with runny nose on a pretty consistent basis.  Otherwise, she really has no other nasal complaints.  She believes that her reflux is under good control at this point in time.  She has apparently developed a dermatitis over the course of the past 6 months affecting her arms and legs.  She has been given betamethasone topical agent which is working quite well in helping this issue and she had to use antibiotics apparently for an infection of her skin.  She did obtain the flu vaccine this year.  Allergies as of 07/11/2018      Reactions   Penicillins Hives   Has patient had a PCN reaction causing immediate rash, facial/tongue/throat swelling, SOB or lightheadedness with hypotension: No Has patient had a PCN reaction causing severe rash involving mucus membranes or skin necrosis: Yes Has patient had a PCN reaction that required hospitalization No Has patient had a PCN reaction occurring within the last 10 years: Yes took again last year 2016 If all of the above answers are "NO", then may proceed with Cephalosporin use.   Naprosyn [naproxen] Nausea Only   diarrhea   Sertraline Diarrhea   Lactose  Intolerance (gi) Other (See Comments)   Unknown- [facility] documented      Medication List      albuterol 108 (90 Base) MCG/ACT inhaler Commonly known as:  PROVENTIL HFA;VENTOLIN HFA INHALE 2 PUFFS BY MOUTH EVERY 4 TO 6 HOURS AS NEEDED FOR COUGH OR WHEEZING   aspirin 81 MG tablet Take 81 mg by mouth daily.   atorvastatin 10 MG tablet Commonly known as:  LIPITOR Take 1 tablet (10 mg total) by mouth at bedtime.   augmented betamethasone dipropionate 0.05 % ointment Commonly known as:  DIPROLENE-AF APPLY EXTERNALLY ONCE OR TWICE A DAY   donepezil 10 MG tablet Commonly known as:  ARICEPT Take 1 tablet (10 mg total) by mouth daily.   DULoxetine 60 MG capsule Commonly known as:  CYMBALTA Take 60 mg by mouth daily. Reported on 08/05/2015   famotidine 40 MG tablet Commonly known as:  PEPCID TAKE 1 TABLET BY MOUTH ONCE DAILY AT BEDTIME DISCONTINUE RANITIDINE   fluticasone 50 MCG/ACT nasal spray Commonly known as:  FLONASE Place 2 sprays into both nostrils daily.   fluticasone furoate-vilanterol 200-25 MCG/INH Aepb Commonly known as:  BREO ELLIPTA Inhale 1 puff into the lungs daily.   folic acid 1 MG tablet Commonly known as:  FOLVITE Take 1 tablet (1 mg total) by mouth daily.   furosemide 20 MG tablet Commonly known as:  LASIX Take 1 tablet (20 mg total) by mouth daily.  irbesartan 300 MG tablet Commonly known as:  AVAPRO TAKE 1 TABLET BY MOUTH ONCE DAILY FOR 90 DAYS   metoprolol tartrate 50 MG tablet Commonly known as:  LOPRESSOR Take 50 mg by mouth 2 (two) times daily. Pt reports she is taking this medication   pantoprazole 40 MG tablet Commonly known as:  PROTONIX TAKE 1 TABLET BY MOUTH ONCE DAILY FOR 30 DAYS   PROBIOTIC DAILY Caps Take 1 capsule by mouth daily.   Turmeric 500 MG Caps Take 1,000 mg by mouth.   TYLENOL ARTHRITIS EXT RELIEF PO Take by mouth.       Past Medical History:  Diagnosis Date  . Acid reflux   .  Asthma    Asthmatic COPD  . Atrial fibrillation ([place]) 01/01/2016  . Coronary artery disease    Echo 07/27/2011   . Coronary artery stenosis    , High-grade left main  . Dementia ([place])   . Essential tremor 03/16/2018  . GERD (gastroesophageal reflux disease)   . Hyperlipidemia   . Hypertension   . IBS (irritable bowel syndrome)   . Psoriasis     Past Surgical History:  Procedure Laterality Date  . CARDIAC SURGERY    . CORONARY ARTERY BYPASS GRAFT  1994   After catheterization showed 90% ostial left main stenosis  . DOPPLER ECHOCARDIOGRAPHY     Study showed mild to moderate MR with moderate TR, mild to moderate pulmonary hypertension with an ejection fraction greater that 55%.   [name] EYE SURGERY    . Nuclear Perfusion  10/2010, 11/15/2012   Showed normal perfusion, No Ischemia or Infarction, Normal EF  . Renal Scan Duplex  04/09/2012   Showing greater than 50% diameter reduction in the celiac artery and SMA.. Right proximal 275 and Mid 152.  [name] ROTATOR CUFF REPAIR     bilateral  . Sleep Study  12/28/2006   AHI during total sleep time (3h 19 minutes) was 1.81/hr and during REM sleep at 17.78/hr. Mild sleep apnea during REM sleep.Oxygen staturated rate during REM and NREM was 93.0%.    Review of systems negative except as noted in HPI / PMHx or noted below:  Review of Systems  Constitutional: Negative.   HENT: Negative.   Eyes: Negative.   Respiratory: Negative.   Cardiovascular: Negative.   Gastrointestinal: Negative.   Genitourinary: Negative.   Musculoskeletal: Negative.   Skin: Negative.   Neurological: Negative.   Endo/Heme/Allergies: Negative.   Psychiatric/Behavioral: Negative.      Objective:   Vitals:   07/11/18 1509  BP: 134/72  Pulse: 66  Resp: 18  SpO2: 95%          Physical Exam Constitutional:      Appearance: She is not diaphoretic.  HENT:     Head: Normocephalic.     Right Ear: Tympanic membrane, ear canal and external ear normal.     Left  Ear: Tympanic membrane, ear canal and external ear normal.     Nose: Nose normal. No mucosal edema or rhinorrhea.     Mouth/Throat:     Pharynx: Uvula midline. No oropharyngeal exudate.  Eyes:     Conjunctiva/sclera: Conjunctivae normal.  Neck:     Thyroid: No thyromegaly.     Trachea: Trachea normal. No tracheal tenderness or tracheal deviation.  Cardiovascular:     Rate and Rhythm: Normal rate and regular rhythm.     Heart sounds: Normal heart sounds, S1 normal and S2 normal. No murmur.  Pulmonary:  Effort: No respiratory distress.     Breath sounds: Normal breath sounds. No stridor. No wheezing or rales.  Lymphadenopathy:     Head:     Right side of head: No tonsillar adenopathy.     Left side of head: No tonsillar adenopathy.     Cervical: No cervical adenopathy.  Skin:    Findings: Rash (Multiple well-healed ulcerative lesions affecting the arms and legs with minimal erythema and no drainage.) present. No erythema.     Nails: There is no clubbing.   Neurological:     Mental Status: She is alert.     Diagnostics:    Spirometry was performed and demonstrated an FEV1 of 0.80 at 66 % of predicted.  The patient had an Asthma Control Test with the following results: ACT Total Score: 12.   Oxygen saturation at room air at rest was 95%.  Oxygen saturation at room air walking the hallway was 86%.  Oxygen saturation on 2 L nasal cannula walking the hall was 92%.  Assessment and Plan:   1. Asthma, severe persistent, well-controlled   2. Perennial allergic rhinitis   3. LPRD (laryngopharyngeal reflux disease)   4. Exercise hypoxemia     1. Use the following every day:   A. Breo 200 one inhalation one time per day  B. Omeprazole 20mg  one tablet two times per day  C. Ranitidine 300 one time per day - PM  D. flonase one spray each nostril 3-7 times per week  2. If Needed:   A. Ventolin HFA 2 puffs every 4-6 hours  B. Cetirizine  C.  Ipratropium 0.06%.  1-2 sprays each  nostril 3 times a day to dry nose  3. Start 2 liter nasal cannula oxygen while exercising  4. Obtain nocturnal oxymetry study at home  5.  Return in 4 weeks or earlier if problem   A[place]aya desaturates oxygen during exertion and she needs to use oxygen when she exerts herself.  She was not very happy about this issue because during a hospitalization in the past she was sent home with oxygen and it cost her a large amount of money.  I am not sure that she is going to allow [place] to provide oxygen for her but we will press ahead and see if we can do so.  She will remain on a combination therapy directed against respiratory tract inflammation and reflux.  I will see her back in this clinic in 4 weeks or earlier if there is a problem.  We will check to see what happens to her oxygen at nighttime with a nocturnal oximetry study.  Allena Katz, MD Allergy / Immunology [place]

## 2018-07-11 NOTE — Patient Instructions (Addendum)
  1. Use the following every day:   A. Breo 200 one inhalation one time per day  B. Omeprazole 20mg  one tablet two times per day  C. Ranitidine 300 one time per day - PM  D. flonase one spray each nostril 3-7 times per week  2. If Needed:   A. Ventolin HFA 2 puffs every 4-6 hours  B. Cetirizine  C.  Ipratropium 0.06%.  1-2 sprays each nostril 3 times a day to dry nose  3. Start 2 liter nasal cannula oxygen while exercising  4. Obtain nocturnal oxymetry study at home  5.  Return in 4 weeks or earlier if problem

## 2018-07-12 ENCOUNTER — Encounter: Payer: Self-pay | Admitting: Allergy and Immunology

## 2018-07-12 DIAGNOSIS — R0902 Hypoxemia: Secondary | ICD-10-CM | POA: Diagnosis not present

## 2018-07-12 DIAGNOSIS — J4540 Moderate persistent asthma, uncomplicated: Secondary | ICD-10-CM | POA: Diagnosis not present

## 2018-07-12 NOTE — Addendum Note (Signed)
Addended by: Jiles Prows on: 07/12/2018 10:50 AM   Modules accepted: Orders

## 2018-07-12 NOTE — Addendum Note (Signed)
Addended by: Orlene Erm on: 07/12/2018 10:48 AM   Modules accepted: Orders

## 2018-07-13 ENCOUNTER — Telehealth: Payer: Self-pay

## 2018-07-13 NOTE — Telephone Encounter (Signed)
[employer] representative called to advise that the patient seemed very confused when the oxygen was dropped off. Once placed on the oxygen the patient seemed somewhat better. She also requested very detailed directions so that she can look back at them.

## 2018-07-18 ENCOUNTER — Telehealth: Payer: Self-pay

## 2018-07-18 DIAGNOSIS — R0902 Hypoxemia: Secondary | ICD-10-CM

## 2018-07-18 DIAGNOSIS — R531 Weakness: Secondary | ICD-10-CM

## 2018-07-18 NOTE — Telephone Encounter (Signed)
Patient is very confused about when she is supposed to be using her oxygen. I informed her that per Dr. Bruna Potter instructions she is to be using 2 lpm of O2 while exercising. She acknowledged understanding of this information. I told her that I would call and see about the overnight oximetry as well as a more portable oxygen tank. Patient is in agreement with this plan.

## 2018-07-18 NOTE — Telephone Encounter (Signed)
Patient is calling and she is a little confused on what to do about the oxygen and why someone is trying to drop the oxygen off to her.   Please Advise

## 2018-07-19 NOTE — Telephone Encounter (Signed)
I spoke with Estill Bamberg this morning. She informed me that the patient had called yesterday and informed them that she did not know why she had been placed on oxygen and that the tank was too heavy for her to carry. This is similar to what I told her during our conversation. Estill Bamberg did have the suggestion to give the patient an oxygen conserving device with a concentrator as this would me much lighter for her. Estill Bamberg is sending an order over to get approved with Dr. Neldon Mc. She will then see if patient will qualify with insurance for this type of device.    The nocturnal oximetry has been ordered and verified with [employer].

## 2018-07-19 NOTE — Telephone Encounter (Signed)
OK, The initial request from me was to get a conserving device. Please make this happen.

## 2018-07-19 NOTE — Telephone Encounter (Signed)
Per Dr. Neldon Mc order has been faxed for the ocd with concentrator to [facility]. Verbal authorization from Dr. Neldon Mc given to [place] Bethanne Ginger) with the diagnosis J44.9 (copd, unspecified).

## 2018-07-21 DIAGNOSIS — M48061 Spinal stenosis, lumbar region without neurogenic claudication: Secondary | ICD-10-CM | POA: Diagnosis not present

## 2018-07-21 DIAGNOSIS — M5136 Other intervertebral disc degeneration, lumbar region: Secondary | ICD-10-CM | POA: Diagnosis not present

## 2018-07-21 DIAGNOSIS — M545 Low back pain: Secondary | ICD-10-CM | POA: Diagnosis not present

## 2018-07-24 DIAGNOSIS — J439 Emphysema, unspecified: Secondary | ICD-10-CM | POA: Diagnosis not present

## 2018-07-24 DIAGNOSIS — L309 Dermatitis, unspecified: Secondary | ICD-10-CM | POA: Diagnosis not present

## 2018-07-24 DIAGNOSIS — L0390 Cellulitis, unspecified: Secondary | ICD-10-CM | POA: Diagnosis not present

## 2018-07-28 ENCOUNTER — Other Ambulatory Visit: Payer: Self-pay | Admitting: Cardiovascular Disease

## 2018-07-28 ENCOUNTER — Other Ambulatory Visit: Payer: Self-pay | Admitting: *Deleted

## 2018-07-28 DIAGNOSIS — F324 Major depressive disorder, single episode, in partial remission: Secondary | ICD-10-CM | POA: Diagnosis not present

## 2018-07-28 DIAGNOSIS — I10 Essential (primary) hypertension: Secondary | ICD-10-CM | POA: Diagnosis not present

## 2018-07-28 DIAGNOSIS — E782 Mixed hyperlipidemia: Secondary | ICD-10-CM | POA: Diagnosis not present

## 2018-07-28 DIAGNOSIS — F0390 Unspecified dementia without behavioral disturbance: Secondary | ICD-10-CM | POA: Diagnosis not present

## 2018-07-28 DIAGNOSIS — M810 Age-related osteoporosis without current pathological fracture: Secondary | ICD-10-CM | POA: Diagnosis not present

## 2018-07-28 DIAGNOSIS — J4540 Moderate persistent asthma, uncomplicated: Secondary | ICD-10-CM | POA: Diagnosis not present

## 2018-07-28 DIAGNOSIS — J439 Emphysema, unspecified: Secondary | ICD-10-CM | POA: Diagnosis not present

## 2018-07-28 MED ORDER — IRBESARTAN 300 MG PO TABS: 300.0000 mg | ORAL_TABLET | Freq: Every day | ORAL | 0 refills | Status: DC

## 2018-07-28 NOTE — Telephone Encounter (Signed)
° ° ° °*  STAT* If patient is at the pharmacy, call can be transferred to refill team.   1. Which medications need to be refilled? (please list name of each medication and dose if known) irbesartan (AVAPRO) 300 MG tablet  2. Which pharmacy/location (including street and city if local pharmacy) is medication to be sent to? [facility]  3. Do they need a 30 day or 90 day supply? [place]

## 2018-08-03 ENCOUNTER — Other Ambulatory Visit: Payer: Self-pay | Admitting: *Deleted

## 2018-08-03 ENCOUNTER — Telehealth: Payer: Self-pay | Admitting: *Deleted

## 2018-08-03 MED ORDER — PREDNISONE 10 MG PO TABS: 20.0000 mg | ORAL_TABLET | Freq: Every day | ORAL | 0 refills | Status: AC

## 2018-08-03 MED ORDER — CLINDAMYCIN HCL 150 MG PO CAPS: 150.0000 mg | ORAL_CAPSULE | Freq: Three times a day (TID) | ORAL | 0 refills | Status: AC

## 2018-08-03 NOTE — Telephone Encounter (Signed)
Tammy [name] has had lots of coughing over the course of the past 4 days without any fever or sputum production or sore throat or ugly nasal discharge or chills or aches or chest pain.  She is already been using a low dose of prednisone at 10 mg and doxycycline for her lower extremity dermatitis.  She cannot come into the office at this point because of a transportation issue.  She has been using her oxygen fortunately that was prescribed during her last visit.  Please inform patient that we will call out antibiotics using clindamycin 150 3 times a day for 7 days and prednisone 20mg  1 time a day for 7 days and if she is not better then come in to see [place] of if she develop worsening symptoms she can also go to the ER.

## 2018-08-03 NOTE — Telephone Encounter (Signed)
Called and spoke with the patient and informed of medications. Informed patient that if they are not well after 7 days to please call us for an OV or go to the ER for worsening symptoms. Patient verbalized understanding. Prescriptions have been sent to requested pharmacy.

## 2018-08-03 NOTE — Telephone Encounter (Signed)
Patient called states she thinks she has bronchitis has a bad cough for 4 days. She denies fever. Dr Neldon Mc please advise

## 2018-08-08 ENCOUNTER — Ambulatory Visit: Payer: PPO | Admitting: Allergy and Immunology

## 2018-08-10 DIAGNOSIS — R0902 Hypoxemia: Secondary | ICD-10-CM | POA: Diagnosis not present

## 2018-08-10 DIAGNOSIS — J4540 Moderate persistent asthma, uncomplicated: Secondary | ICD-10-CM | POA: Diagnosis not present

## 2018-08-24 ENCOUNTER — Telehealth: Payer: Self-pay

## 2018-08-24 NOTE — Telephone Encounter (Signed)
Oxygen machine quit working last night.  Called the number on the machine, it said it was no longer in service. [employer] is on the [place]  Spoke with Bethanne Ginger at [facility], she will call the patient back and find out what is wrong with the machine and send a service technician out today to see about it.   Attempted to call patient back, no answer.  Call pt back, she spoke with [employer], they will be out today.  No further needs.  Call was ended.

## 2018-09-05 DIAGNOSIS — I8310 Varicose veins of unspecified lower extremity with inflammation: Secondary | ICD-10-CM | POA: Diagnosis not present

## 2018-09-05 DIAGNOSIS — I872 Venous insufficiency (chronic) (peripheral): Secondary | ICD-10-CM | POA: Diagnosis not present

## 2018-09-07 DIAGNOSIS — F324 Major depressive disorder, single episode, in partial remission: Secondary | ICD-10-CM | POA: Diagnosis not present

## 2018-09-07 DIAGNOSIS — J4540 Moderate persistent asthma, uncomplicated: Secondary | ICD-10-CM | POA: Diagnosis not present

## 2018-09-07 DIAGNOSIS — J439 Emphysema, unspecified: Secondary | ICD-10-CM | POA: Diagnosis not present

## 2018-09-07 DIAGNOSIS — I10 Essential (primary) hypertension: Secondary | ICD-10-CM | POA: Diagnosis not present

## 2018-09-07 DIAGNOSIS — F0390 Unspecified dementia without behavioral disturbance: Secondary | ICD-10-CM | POA: Diagnosis not present

## 2018-09-07 DIAGNOSIS — M810 Age-related osteoporosis without current pathological fracture: Secondary | ICD-10-CM | POA: Diagnosis not present

## 2018-09-07 DIAGNOSIS — E782 Mixed hyperlipidemia: Secondary | ICD-10-CM | POA: Diagnosis not present

## 2018-09-10 DIAGNOSIS — R0902 Hypoxemia: Secondary | ICD-10-CM | POA: Diagnosis not present

## 2018-09-10 DIAGNOSIS — J4540 Moderate persistent asthma, uncomplicated: Secondary | ICD-10-CM | POA: Diagnosis not present

## 2018-09-26 ENCOUNTER — Ambulatory Visit: Payer: PPO | Admitting: Allergy and Immunology

## 2018-10-04 DIAGNOSIS — E782 Mixed hyperlipidemia: Secondary | ICD-10-CM | POA: Diagnosis not present

## 2018-10-04 DIAGNOSIS — M810 Age-related osteoporosis without current pathological fracture: Secondary | ICD-10-CM | POA: Diagnosis not present

## 2018-10-04 DIAGNOSIS — J4540 Moderate persistent asthma, uncomplicated: Secondary | ICD-10-CM | POA: Diagnosis not present

## 2018-10-04 DIAGNOSIS — F324 Major depressive disorder, single episode, in partial remission: Secondary | ICD-10-CM | POA: Diagnosis not present

## 2018-10-04 DIAGNOSIS — F0390 Unspecified dementia without behavioral disturbance: Secondary | ICD-10-CM | POA: Diagnosis not present

## 2018-10-04 DIAGNOSIS — J439 Emphysema, unspecified: Secondary | ICD-10-CM | POA: Diagnosis not present

## 2018-10-04 DIAGNOSIS — I10 Essential (primary) hypertension: Secondary | ICD-10-CM | POA: Diagnosis not present

## 2018-10-10 DIAGNOSIS — J4540 Moderate persistent asthma, uncomplicated: Secondary | ICD-10-CM | POA: Diagnosis not present

## 2018-10-10 DIAGNOSIS — R0902 Hypoxemia: Secondary | ICD-10-CM | POA: Diagnosis not present

## 2018-10-16 ENCOUNTER — Ambulatory Visit: Payer: PPO | Admitting: Neurology

## 2018-10-16 ENCOUNTER — Telehealth: Payer: Self-pay | Admitting: Cardiovascular Disease

## 2018-10-16 NOTE — Telephone Encounter (Signed)
No mychart, no smartphone, consent, pre reg complete 10/16/18 AF

## 2018-10-18 ENCOUNTER — Encounter: Payer: Self-pay | Admitting: Cardiovascular Disease

## 2018-10-18 ENCOUNTER — Telehealth (INDEPENDENT_AMBULATORY_CARE_PROVIDER_SITE_OTHER): Payer: PPO | Admitting: Cardiovascular Disease

## 2018-10-18 DIAGNOSIS — F0390 Unspecified dementia without behavioral disturbance: Secondary | ICD-10-CM | POA: Diagnosis not present

## 2018-10-18 DIAGNOSIS — J45909 Unspecified asthma, uncomplicated: Secondary | ICD-10-CM | POA: Diagnosis not present

## 2018-10-18 DIAGNOSIS — E785 Hyperlipidemia, unspecified: Secondary | ICD-10-CM | POA: Diagnosis not present

## 2018-10-18 DIAGNOSIS — R600 Localized edema: Secondary | ICD-10-CM | POA: Diagnosis not present

## 2018-10-18 DIAGNOSIS — I25119 Atherosclerotic heart disease of native coronary artery with unspecified angina pectoris: Secondary | ICD-10-CM | POA: Diagnosis not present

## 2018-10-18 DIAGNOSIS — I10 Essential (primary) hypertension: Secondary | ICD-10-CM

## 2018-10-18 NOTE — Progress Notes (Signed)
Virtual Visit via Telephone Note   This visit type was conducted due to national recommendations for restrictions regarding the COVID-19 Pandemic (e.g. social distancing) in an effort to limit this patient's exposure and mitigate transmission in our community.  Due to her co-morbid illnesses, this patient is at least at moderate risk for complications without adequate follow up.  This format is felt to be most appropriate for this patient at this time.  The patient did not have access to video technology/had technical difficulties with video requiring transitioning to audio format only (telephone).  All issues noted in this document were discussed and addressed.  No physical exam could be performed with this format.  Please refer to the patient's chart for her  consent to telehealth for [facility].   Date:  10/18/2018   ID:  Tammy, [name] 1930-09-19, MRN [id number]  Patient Location: Home Provider Location: Home  PCP:  [name], MD  Cardiologist:  Shelva Majestic, MD  Electrophysiologist:  None   Evaluation Performed:  Follow-Up Visit  Chief Complaint: 35-month follow-up evaluation  History of Present Illness:    Tammy [name] is a 85 y.o. female who has  CAD and underwent CABG surgery in 1994 after she was found to have high-grade left main stenosis. A nuclear perfusion study in June 2011 showed normal perfusion. An echo Doppler study in March 2013 showed mild-to-moderate mitral regurgitation, moderate tricuspid regurgitation, mild-to-moderate pulmonary hypertension with estimated RV systolic pressure 45 mm, mild to moderate LA dilatation, aortic sclerosis/calcification, and an ejection fraction greater than 55%. She has documented right renal artery stenosis on renal duplex scanning with the last scan in November 2013 suggesting a equal or greater than 60% diameter reduction by velocity in the right renal artery. The kidneys are otherwise normal in size, symmetrical in shape  and had normal cortex and medulla. She does have a history of  hypertension and hyperlipidemia. In the past she also has had some issues with cellulitis of her lower extremity.   A two-year followup nuclear perfusion study on 11/16/2011; no significant EKG changes were demonstrated. The study was low risk and did not show any area of scar or infarction. This is unchanged from 2 years previously.  She has had issues with lower extremity edema and this improved with reduction of her amlodipine dose to 5 mg from 10 mg. A follow-up echo Doppler study continued to show normal systolic function.  There was mild left atrial dilatation and mild pulmonary hypertension with a PA pressure slightly improved from previously at 38 mm.  She did not have significant valvular abnormality.  A follow-up renal duplex study was unchanged from her prior study of one year previously and showed equal to less than 6% diameter reduction in the right renal artery.  She had normal patency to the left renal artery.  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   She underwent a follow-up nuclear perfusion study in July 2016.  This was normal without scar or ischemia.  Ejection fraction was 74% and she had normal wall motion.  She did not develop any ECG changes.  She was hospitalized September and in October 2017.  She had episodes of lightheadedness upon standing, nausea, lower abdominal pain and confusion.  He was found to have renal failure acutely and was treated with intravenous fluids.  He had increased to 1.74.  He has a history of atrial fibrillation but was felt not to be a  candidate for anticoagulation due to fall risk.  Her beta blocker had been discontinued due to bradycardia.  When she was admitted to [facility] from October 5 through 02/22/2016 she was treated with Cardizem.  She had an unremarkable CT of her head and neck.  She broke her wrist before Christmas.  She has been followed by Dr. Gus Height, for primary care.   When I saw her in October 2018 her blood pressure was elevated. In the past, she apparently had some issues with diltiazem.  She was back on metoprolol.  With her continued blood pressure elevation.  I suggested the addition of amlodipine 5 mg.  Apparently there is no record that this ever was filled.  She was no longer fully aware of the medicines she is taking since her daughter provides her medicine for her.  She was on metoprolol tartrate 50 mg twice a day, irbesartan 150 mg daily for hypertension, low-dose atorvastatin 10 mg for hyperlipidemia.  She is on Aricept.  There is a history of depression for which she's on Cymbalta.  She denied recent wheezing on when necessary albuterol.  In addition to breo-ellipta.    Since I last saw her in December 2019, she denies any episodes of chest pain.  She is on supplemental oxygen therapy.  She denies any significant leg swelling.  She has not [place] with her primary physician Dr. Harrington Challenger.  She is unaware of any palpitations.  She presents for evaluation.  The patient does not have symptoms concerning for COVID-19 infection (fever, chills, cough, or new shortness of breath).    Past Medical History:  Diagnosis Date   Acid reflux    Asthma    Asthmatic COPD   Atrial fibrillation ([place]) 01/01/2016   Coronary artery disease    Echo 07/27/2011    Coronary artery stenosis    , High-grade left main   Dementia (HCC)    Essential tremor 03/16/2018   GERD (gastroesophageal reflux disease)    Hyperlipidemia    Hypertension    IBS (irritable bowel syndrome)    Psoriasis    Past Surgical History:  Procedure Laterality Date   CARDIAC SURGERY     CORONARY ARTERY BYPASS GRAFT  1994   After catheterization showed 90% ostial left main stenosis   DOPPLER ECHOCARDIOGRAPHY     Study showed mild to moderate MR with moderate TR, mild to moderate pulmonary hypertension with an ejection fraction greater that 55%.    EYE  SURGERY     Nuclear Perfusion  10/2010, 11/15/2012   Showed normal perfusion, No Ischemia or Infarction, Normal EF   Renal Scan Duplex  04/09/2012   Showing greater than 50% diameter reduction in the celiac artery and SMA.. Right proximal 275 and Mid 152.   ROTATOR CUFF REPAIR     bilateral   Sleep Study  12/28/2006   AHI during total sleep time (3h 19 minutes) was 1.81/hr and during REM sleep at 17.78/hr. Mild sleep apnea during REM sleep.Oxygen staturated rate during REM and NREM was 93.0%.     Current Meds  Medication Sig   Acetaminophen (TYLENOL ARTHRITIS EXT RELIEF PO) Take by mouth.   albuterol (PROVENTIL HFA;VENTOLIN HFA) 108 (90 Base) MCG/ACT inhaler INHALE 2 PUFFS BY MOUTH EVERY 4 TO 6 HOURS AS NEEDED FOR COUGH OR WHEEZING   aspirin 81 MG tablet Take 81 mg by mouth daily.    atorvastatin (LIPITOR) 10 MG tablet Take 1 tablet (10 mg total) by mouth at bedtime.  augmented betamethasone dipropionate (DIPROLENE-AF) 0.05 % ointment APPLY EXTERNALLY ONCE OR TWICE A DAY   donepezil (ARICEPT) 10 MG tablet Take 1 tablet (10 mg total) by mouth daily.   DULoxetine (CYMBALTA) 60 MG capsule Take 60 mg by mouth daily. Reported on 08/05/2015   famotidine (PEPCID) 40 MG tablet TAKE 1 TABLET BY MOUTH ONCE DAILY AT BEDTIME DISCONTINUE RANITIDINE   fluticasone (FLONASE) 50 MCG/ACT nasal spray Place 2 sprays into both nostrils daily.   fluticasone furoate-vilanterol (BREO ELLIPTA) 200-25 MCG/INH AEPB Inhale 1 puff into the lungs daily.   folic acid (FOLVITE) 1 MG tablet Take 1 tablet (1 mg total) by mouth daily.   irbesartan (AVAPRO) 300 MG tablet Take 1 tablet (300 mg total) by mouth daily.   metoprolol tartrate (LOPRESSOR) 50 MG tablet Take 50 mg by mouth 2 (two) times daily. Pt reports she is taking this medication   pantoprazole (PROTONIX) 40 MG tablet TAKE 1 TABLET BY MOUTH ONCE DAILY FOR 30 DAYS   Probiotic Product (PROBIOTIC DAILY) CAPS Take 1 capsule by mouth daily.    Turmeric 500 MG CAPS Take 1,000 mg by mouth.     Allergies:   Penicillins; Naprosyn [naproxen]; Sertraline; and Lactose intolerance (gi)   Social History   Tobacco Use   Smoking status: Former Smoker    Packs/day: 1.00    Years: 18.00    Pack years: 18.00    Last attempt to quit: 11/08/1962    Years since quitting: 55.9   Smokeless tobacco: Never Used  Substance Use Topics   Alcohol use: No    Alcohol/week: 0.0 standard drinks    Comment: h/o heavy use - 2 bottles of wine daily   Drug use: No     Family Hx: The patient's family history includes Cancer in her brother; Colon cancer in her brother; Heart disease in her mother; Hypertension in her sister.  ROS:   Please see the history of present illness.    No fevers chills night sweats No cough Positive for asthma, controlled No recurrent anginal symptoms No palpitations, presyncope or syncope History of GERD Prior leg swelling, resolved Positive for dementia Sleeping okay  All other systems reviewed and are negative.   Prior CV studies:   The following studies were reviewed today:  ------------------------------------------------------------------- ECHO 10/01/2015 Study Conclusions  - Left ventricle: The cavity size was normal. There was mild focal   basal hypertrophy of the septum. Systolic function was normal.   The estimated ejection fraction was in the range of 60% to 65%.   Wall motion was normal; there were no regional wall motion   abnormalities. Doppler parameters are consistent with abnormal   left ventricular relaxation (grade 1 diastolic dysfunction). - Aortic valve: There was no stenosis. - Mitral valve: Mildly calcified annulus. There was no significant   regurgitation. - Left atrium: The atrium was mildly dilated. - Right ventricle: The cavity size was normal. Systolic function   was normal. - Tricuspid valve: Peak RV-RA gradient (S): 27 mm Hg. - Pulmonary arteries: PA peak pressure: 30 mm Hg  (S). - Inferior vena cava: The vessel was normal in size. The   respirophasic diameter changes were in the normal range (>= 50%),   consistent with normal central venous pressure.  Impressions:  - Normal LV size with mild focal basal septal hypertrophy. EF   60-65%. Normal RV size and systolic function. No significant   valvular abnormalities.   Labs/Other Tests and Data Reviewed:    EKG:  An  ECG dated 04/17/2018 was personally reviewed today and demonstrated:  Normal sinus rhythm at 72 bpm  Recent Labs: No results found for requested labs within last 8760 hours.   Recent Lipid Panel Lab Results  Component Value Date/Time   CHOL 118 01/03/2016 03:04 AM   CHOL 128 11/09/2012 08:05 AM   TRIG 106 01/03/2016 03:04 AM   TRIG 84 11/09/2012 08:05 AM   HDL 43 01/03/2016 03:04 AM   HDL 50 11/09/2012 08:05 AM   CHOLHDL 2.7 01/03/2016 03:04 AM   LDLCALC 54 01/03/2016 03:04 AM   LDLCALC 61 11/09/2012 08:05 AM    Wt Readings from Last 3 Encounters:  04/17/18 150 lb 3.2 oz (68.1 kg)  03/16/18 145 lb (65.8 kg)  03/09/18 140 lb (63.5 kg)     Objective:    Vital Signs:  There were no vitals taken for this visit.   This was a phone visit.  I was unable to physically examine the patient.  She did not have the ability to check her blood pressure. Was difficult to understand her.  Her speech was weak. Respirations were unlabored I did not hear audible wheezing She denied any leg swelling She denied any abnormal heart rhythm and felt that her pulse was regular She has a history of depression    ASSESSMENT & PLAN:    1. Essential hypertension: I could not check her blood pressure today.  She continues to be on irbesartan 300 mg daily and metoprolol 50 mg twice a day. 2. Hyperlipidemia with target LDL less than 70: She is on atorvastatin 10 mg.  I will needed obtain laboratory results from Dr. Harrington Challenger.  Dose adjustment should be made with addition of Zetia if she is not at  target 3. CAD: History of CABG revascularization in 1994.  Currently no anginal symptoms on medical regimen 4. Dementia: Currently on Aricept 5. Previous leg edema: Resolved 6. History of GERD on pantoprazole 7. Asthma: On [place]  COVID-19 Education: The signs and symptoms of COVID-19 were discussed with the patient and how to seek care for testing (follow up with PCP or arrange E-visit).  The importance of social distancing was discussed today.  Time:   Today, I have spent 26 minutes with the patient with telehealth technology discussing the above problems.     Medication Adjustments/Labs and Tests Ordered: Current medicines are reviewed at length with the patient today.  Concerns regarding medicines are outlined above.   Tests Ordered: No orders of the defined types were placed in this encounter.   Medication Changes: No orders of the defined types were placed in this encounter.   Disposition:  Follow up 6 MONTHS  Signed, Shelva Majestic, MD  10/18/2018 1:57 PM    [facility]

## 2018-10-18 NOTE — Patient Instructions (Addendum)

## 2018-10-23 DIAGNOSIS — I872 Venous insufficiency (chronic) (peripheral): Secondary | ICD-10-CM | POA: Diagnosis not present

## 2018-10-23 DIAGNOSIS — R21 Rash and other nonspecific skin eruption: Secondary | ICD-10-CM | POA: Diagnosis not present

## 2018-10-23 DIAGNOSIS — Z0000 Encounter for general adult medical examination without abnormal findings: Secondary | ICD-10-CM | POA: Diagnosis not present

## 2018-10-23 DIAGNOSIS — J439 Emphysema, unspecified: Secondary | ICD-10-CM | POA: Diagnosis not present

## 2018-10-23 DIAGNOSIS — L400 Psoriasis vulgaris: Secondary | ICD-10-CM | POA: Diagnosis not present

## 2018-10-23 DIAGNOSIS — I700 Atherosclerosis of aorta: Secondary | ICD-10-CM | POA: Diagnosis not present

## 2018-10-23 DIAGNOSIS — F0390 Unspecified dementia without behavioral disturbance: Secondary | ICD-10-CM | POA: Diagnosis not present

## 2018-10-23 DIAGNOSIS — E782 Mixed hyperlipidemia: Secondary | ICD-10-CM | POA: Diagnosis not present

## 2018-10-23 DIAGNOSIS — K219 Gastro-esophageal reflux disease without esophagitis: Secondary | ICD-10-CM | POA: Diagnosis not present

## 2018-10-23 DIAGNOSIS — I10 Essential (primary) hypertension: Secondary | ICD-10-CM | POA: Diagnosis not present

## 2018-10-23 DIAGNOSIS — M25519 Pain in unspecified shoulder: Secondary | ICD-10-CM | POA: Diagnosis not present

## 2018-10-23 DIAGNOSIS — E559 Vitamin D deficiency, unspecified: Secondary | ICD-10-CM | POA: Diagnosis not present

## 2018-10-31 DIAGNOSIS — Z0000 Encounter for general adult medical examination without abnormal findings: Secondary | ICD-10-CM | POA: Diagnosis not present

## 2018-11-01 DIAGNOSIS — M75112 Incomplete rotator cuff tear or rupture of left shoulder, not specified as traumatic: Secondary | ICD-10-CM | POA: Diagnosis not present

## 2018-11-01 DIAGNOSIS — M25512 Pain in left shoulder: Secondary | ICD-10-CM | POA: Diagnosis not present

## 2018-11-01 DIAGNOSIS — M25511 Pain in right shoulder: Secondary | ICD-10-CM | POA: Diagnosis not present

## 2018-11-09 ENCOUNTER — Other Ambulatory Visit: Payer: Self-pay | Admitting: Allergy and Immunology

## 2018-11-09 NOTE — Telephone Encounter (Signed)
Courtesy refill  

## 2018-11-10 DIAGNOSIS — J4540 Moderate persistent asthma, uncomplicated: Secondary | ICD-10-CM | POA: Diagnosis not present

## 2018-11-10 DIAGNOSIS — R0902 Hypoxemia: Secondary | ICD-10-CM | POA: Diagnosis not present

## 2018-11-16 ENCOUNTER — Ambulatory Visit: Payer: PPO | Admitting: Neurology

## 2018-11-16 ENCOUNTER — Other Ambulatory Visit: Payer: Self-pay

## 2018-11-16 ENCOUNTER — Telehealth: Payer: Self-pay | Admitting: Neurology

## 2018-11-16 NOTE — Telephone Encounter (Signed)
This patient did not show for a revisit appointment today. 

## 2018-11-21 ENCOUNTER — Ambulatory Visit (INDEPENDENT_AMBULATORY_CARE_PROVIDER_SITE_OTHER): Payer: PPO | Admitting: Allergy and Immunology

## 2018-11-21 ENCOUNTER — Other Ambulatory Visit: Payer: Self-pay

## 2018-11-21 VITALS — BP 136/70 | HR 54 | Temp 97.0°F | Resp 18

## 2018-11-21 DIAGNOSIS — J4550 Severe persistent asthma, uncomplicated: Secondary | ICD-10-CM | POA: Diagnosis not present

## 2018-11-21 DIAGNOSIS — J3089 Other allergic rhinitis: Secondary | ICD-10-CM

## 2018-11-21 DIAGNOSIS — K219 Gastro-esophageal reflux disease without esophagitis: Secondary | ICD-10-CM | POA: Diagnosis not present

## 2018-11-21 NOTE — Progress Notes (Signed)
[place]   Follow-up Note  Referring Provider: [name], MD Primary Provider: [name], MD Date of Office Visit: 11/21/2018  Subjective:   Tammy [name] (DOB: Oct 11, 1930) is a 85 y.o. female who returns to the [facility] [place] on 11/21/2018 in re-evaluation of the following:  HPI: Tammy [name] presents to this clinic in evaluation of COPD with asthma, exercise-induced hypoxemia, allergic rhinitis, and history of LPR.  I last saw her in this clinic on 11 July 2018.  During her last visit she had exercise-induced hypoxemia and we recommended that she start oxygen when she exercises and also have her evaluated with a nocturnal oximetry study.  But, she will not use oxygen and she never did use oxygen and refused her nocturnal oximetry study and she wants [place] to contact the oxygen supply company to have them pick up the oxygen concentrator and accessories.  Overall she feels as though she is doing relatively well.  She thinks that her ability to exercise is better.  She rarely uses a short acting bronchodilator.  She has not required a systemic steroid to treat any type of respiratory issue.  She continues on [place].  Likewise her nose has done well.  Other than running on a pretty common basis she does not have any issues tied up with sinusitis and has not required an antibiotic.  She continues on a nasal steroid.  Her reflux is doing pretty well at this point in time as well.  She continues on a combination of omeprazole and famotidine.  Allergies as of 11/21/2018      Reactions   Penicillins Hives   Has patient had a PCN reaction causing immediate rash, facial/tongue/throat swelling, SOB or lightheadedness with hypotension: No Has patient had a PCN reaction causing severe rash involving mucus membranes or skin necrosis: Yes Has patient had a PCN reaction that required hospitalization No Has patient had a PCN  reaction occurring within the last 10 years: Yes took again last year 2016 If all of the above answers are "NO", then may proceed with Cephalosporin use.   Naprosyn [naproxen] Nausea Only   diarrhea   Sertraline Diarrhea   Lactose Intolerance (gi) Other (See Comments)   Unknown- [facility] documented      Medication List      albuterol 108 (90 Base) MCG/ACT inhaler Commonly known as: VENTOLIN HFA INHALE 2 PUFFS BY MOUTH EVERY 4 TO 6 HOURS AS NEEDED FOR COUGH OR WHEEZING   aspirin 81 MG tablet Take 81 mg by mouth daily.   atorvastatin 10 MG tablet Commonly known as: LIPITOR Take 1 tablet (10 mg total) by mouth at bedtime.   augmented betamethasone dipropionate 0.05 % ointment Commonly known as: DIPROLENE-AF APPLY EXTERNALLY ONCE OR TWICE A DAY   donepezil 10 MG tablet Commonly known as: ARICEPT Take 1 tablet (10 mg total) by mouth daily.   DULoxetine 60 MG capsule Commonly known as: CYMBALTA Take 60 mg by mouth daily. Reported on 08/05/2015   famotidine 40 MG tablet Commonly known as: PEPCID TAKE 1 TABLET BY MOUTH ONCE DAILY AT BEDTIME DISCONTINUE RANITIDINE   fluticasone 50 MCG/ACT nasal spray Commonly known as: FLONASE USE 1 TO 2 SPRAY(S) IN EACH NOSTRIL ONCE DAILY FOR  STUFFY  NOSE  OR  DRAINAGE   fluticasone furoate-vilanterol 200-25 MCG/INH Aepb Commonly known as: Breo Ellipta Inhale 1 puff into the lungs daily.   folic acid  1 MG tablet Commonly known as: FOLVITE Take 1 tablet (1 mg total) by mouth daily.   furosemide 20 MG tablet Commonly known as: LASIX Take 1 tablet (20 mg total) by mouth daily.   irbesartan 300 MG tablet Commonly known as: AVAPRO Take 1 tablet (300 mg total) by mouth daily.   metoprolol tartrate 50 MG tablet Commonly known as: LOPRESSOR Take 50 mg by mouth 2 (two) times daily. Pt reports she is taking this medication   omeprazole 20 MG capsule Commonly known as: PRILOSEC Take 20 mg by mouth 2 (two) times  daily before a meal.   Probiotic Daily Caps Take 1 capsule by mouth daily.   Turmeric 500 MG Caps Take 1,000 mg by mouth.   TYLENOL ARTHRITIS EXT RELIEF PO Take by mouth.       Past Medical History:  Diagnosis Date  . Acid reflux   . Asthma    Asthmatic COPD  . Atrial fibrillation ([place]) 01/01/2016  . Coronary artery disease    Echo 07/27/2011   . Coronary artery stenosis    , High-grade left main  . Dementia ([place])   . Essential tremor 03/16/2018  . GERD (gastroesophageal reflux disease)   . Hyperlipidemia   . Hypertension   . IBS (irritable bowel syndrome)   . Psoriasis     Past Surgical History:  Procedure Laterality Date  . CARDIAC SURGERY    . CORONARY ARTERY BYPASS GRAFT  1994   After catheterization showed 90% ostial left main stenosis  . DOPPLER ECHOCARDIOGRAPHY     Study showed mild to moderate MR with moderate TR, mild to moderate pulmonary hypertension with an ejection fraction greater that 55%.   [name] EYE SURGERY    . Nuclear Perfusion  10/2010, 11/15/2012   Showed normal perfusion, No Ischemia or Infarction, Normal EF  . Renal Scan Duplex  04/09/2012   Showing greater than 50% diameter reduction in the celiac artery and SMA.. Right proximal 275 and Mid 152.  [name] ROTATOR CUFF REPAIR     bilateral  . Sleep Study  12/28/2006   AHI during total sleep time (3h 19 minutes) was 1.81/hr and during REM sleep at 17.78/hr. Mild sleep apnea during REM sleep.Oxygen staturated rate during REM and NREM was 93.0%.    Review of systems negative except as noted in HPI / PMHx or noted below:  Review of Systems  Constitutional: Negative.   HENT: Negative.   Eyes: Negative.   Respiratory: Negative.   Cardiovascular: Negative.   Gastrointestinal: Negative.   Genitourinary: Negative.   Musculoskeletal: Negative.   Skin: Negative.   Neurological: Negative.   Endo/Heme/Allergies: Negative.   Psychiatric/Behavioral: Negative.      Objective:   Vitals:   11/21/18 0953   BP: 136/70  Pulse: (!) 54  Resp: 18  Temp: (!) 97 F (36.1 C)  SpO2: 97%          Physical Exam Constitutional:      Appearance: She is not diaphoretic.  HENT:     Head: Normocephalic.     Right Ear: Tympanic membrane, ear canal and external ear normal.     Left Ear: Tympanic membrane, ear canal and external ear normal.     Nose: Nose normal. No mucosal edema or rhinorrhea.     Mouth/Throat:     Pharynx: Uvula midline. No oropharyngeal exudate.  Eyes:     Conjunctiva/sclera: Conjunctivae normal.  Neck:     Thyroid: No thyromegaly.     Trachea: Trachea  normal. No tracheal tenderness or tracheal deviation.  Cardiovascular:     Rate and Rhythm: Normal rate and regular rhythm.     Heart sounds: Normal heart sounds, S1 normal and S2 normal. No murmur.  Pulmonary:     Effort: No respiratory distress.     Breath sounds: Normal breath sounds. No stridor. No wheezing or rales.  Lymphadenopathy:     Head:     Right side of head: No tonsillar adenopathy.     Left side of head: No tonsillar adenopathy.     Cervical: No cervical adenopathy.  Skin:    Findings: No erythema or rash.     Nails: There is no clubbing.   Neurological:     Mental Status: She is alert.     Diagnostics:    Spirometry was performed and demonstrated an FEV1 of 0.94 at 80 % of predicted.  Oxygen saturation at rest on room air was 97%.  Oxygen saturation with exercise on room air was 94%.  Assessment and Plan:   1. Asthma, severe persistent, well-controlled   2. Perennial allergic rhinitis   3. LPRD (laryngopharyngeal reflux disease)     1. Use the following every day:   A. Breo 200 one inhalation one time per day  B. Omeprazole 20mg  one tablet two times per day  C. Famotidine 40 mg one time per day - PM  D. flonase one spray each nostril 1 time per day  2. If Needed:   A. Ventolin HFA 2 puffs every 4-6 hours  B. Cetirizine 10 mg tablet 1 time per day  C. Ipratropium 0.06%.  1-2 sprays  each nostril 3 times a day to dry nose  3. Obtain fall flu vaccine  4.  Return in November 2020 or earlier if problem   Malessa appears to be doing pretty good at this point in time while utilizing a large collection of therapy directed against respiratory tract inflammation and reflux as noted above.  We will have the oxygen supply company pick up her oxygen concentrator and we will forego evaluation for nocturnal oximetry as she has basically refused this form of evaluation.  I will see her back in this clinic in November 2020 or earlier if there is a problem.  Allena Katz, MD Allergy / Immunology [place]

## 2018-11-21 NOTE — Patient Instructions (Addendum)
  1. Use the following every day:   A. Breo 200 one inhalation one time per day  B. Omeprazole 20mg  one tablet two times per day  C. Famotidine 40 mg one time per day - PM  D. flonase one spray each nostril 1 time per day  2. If Needed:   A. Ventolin HFA 2 puffs every 4-6 hours  B. Cetirizine 10 mg tablet 1 time per day  C.  Ipratropium 0.06%.  1-2 sprays each nostril 3 times a day to dry nose  3. Obtain fall flu vaccine  4.  Return in November 2020 or earlier if problem

## 2018-11-22 ENCOUNTER — Encounter: Payer: Self-pay | Admitting: Allergy and Immunology

## 2018-11-23 ENCOUNTER — Telehealth: Payer: Self-pay

## 2018-11-23 NOTE — Telephone Encounter (Signed)
Patient is calling to see if Dr Neldon Mc reached out to someone at [facility] to get the item in her house removed.   Please Advise.

## 2018-11-24 NOTE — Telephone Encounter (Signed)
Patient states that she no longer needs her oxygen and would like for it to be picked up by [facility]. Called and spoke with [facility] and they stated that they need a discharge order to be able to come and pick up the Oxygen. Please advise about submitting an order to discharge Oxygen.

## 2018-11-24 NOTE — Telephone Encounter (Signed)
Called and spoke with patient and she needs the oxygen picked up from her home that was ordered by Dr. Neldon Mc since she is no longer using it. Advised to patient that will call and speak with [facility] and let her know the plan.

## 2018-11-27 NOTE — Telephone Encounter (Signed)
Discontinue oxygen from [place]

## 2018-11-27 NOTE — Telephone Encounter (Signed)
Order for oxygen to be discontinue has been faxed to [facility]. Will follow up on Tuesday that it was received.

## 2018-11-28 DIAGNOSIS — M533 Sacrococcygeal disorders, not elsewhere classified: Secondary | ICD-10-CM | POA: Diagnosis not present

## 2018-11-28 DIAGNOSIS — M545 Low back pain: Secondary | ICD-10-CM | POA: Diagnosis not present

## 2018-11-28 DIAGNOSIS — M47818 Spondylosis without myelopathy or radiculopathy, sacral and sacrococcygeal region: Secondary | ICD-10-CM | POA: Diagnosis not present

## 2018-11-29 NOTE — Telephone Encounter (Signed)
Spoke with [facility] and they did receive the d/c order and the billing has stopped for the oxygen. It is at corporate and they will pick up the concentrator within the next week.

## 2018-11-30 NOTE — Telephone Encounter (Signed)
Called and spoke with patient and informed. Patient verbalized understanding.

## 2018-12-01 DIAGNOSIS — N183 Chronic kidney disease, stage 3 (moderate): Secondary | ICD-10-CM | POA: Diagnosis not present

## 2018-12-01 DIAGNOSIS — J439 Emphysema, unspecified: Secondary | ICD-10-CM | POA: Diagnosis not present

## 2018-12-01 DIAGNOSIS — E782 Mixed hyperlipidemia: Secondary | ICD-10-CM | POA: Diagnosis not present

## 2018-12-01 DIAGNOSIS — M810 Age-related osteoporosis without current pathological fracture: Secondary | ICD-10-CM | POA: Diagnosis not present

## 2018-12-01 DIAGNOSIS — F324 Major depressive disorder, single episode, in partial remission: Secondary | ICD-10-CM | POA: Diagnosis not present

## 2018-12-01 DIAGNOSIS — I10 Essential (primary) hypertension: Secondary | ICD-10-CM | POA: Diagnosis not present

## 2018-12-01 DIAGNOSIS — J4540 Moderate persistent asthma, uncomplicated: Secondary | ICD-10-CM | POA: Diagnosis not present

## 2018-12-01 DIAGNOSIS — F0390 Unspecified dementia without behavioral disturbance: Secondary | ICD-10-CM | POA: Diagnosis not present

## 2018-12-04 ENCOUNTER — Other Ambulatory Visit: Payer: Self-pay | Admitting: Allergy and Immunology

## 2018-12-06 DIAGNOSIS — M47818 Spondylosis without myelopathy or radiculopathy, sacral and sacrococcygeal region: Secondary | ICD-10-CM | POA: Diagnosis not present

## 2018-12-06 DIAGNOSIS — M533 Sacrococcygeal disorders, not elsewhere classified: Secondary | ICD-10-CM | POA: Diagnosis not present

## 2018-12-29 DIAGNOSIS — M810 Age-related osteoporosis without current pathological fracture: Secondary | ICD-10-CM | POA: Diagnosis not present

## 2018-12-29 DIAGNOSIS — N183 Chronic kidney disease, stage 3 (moderate): Secondary | ICD-10-CM | POA: Diagnosis not present

## 2018-12-29 DIAGNOSIS — F0390 Unspecified dementia without behavioral disturbance: Secondary | ICD-10-CM | POA: Diagnosis not present

## 2018-12-29 DIAGNOSIS — I10 Essential (primary) hypertension: Secondary | ICD-10-CM | POA: Diagnosis not present

## 2018-12-29 DIAGNOSIS — J4540 Moderate persistent asthma, uncomplicated: Secondary | ICD-10-CM | POA: Diagnosis not present

## 2018-12-29 DIAGNOSIS — E782 Mixed hyperlipidemia: Secondary | ICD-10-CM | POA: Diagnosis not present

## 2018-12-29 DIAGNOSIS — F324 Major depressive disorder, single episode, in partial remission: Secondary | ICD-10-CM | POA: Diagnosis not present

## 2018-12-29 DIAGNOSIS — J439 Emphysema, unspecified: Secondary | ICD-10-CM | POA: Diagnosis not present

## 2019-01-05 ENCOUNTER — Other Ambulatory Visit: Payer: Self-pay | Admitting: Allergy and Immunology

## 2019-02-05 DIAGNOSIS — S8000XA Contusion of unspecified knee, initial encounter: Secondary | ICD-10-CM | POA: Diagnosis not present

## 2019-02-05 DIAGNOSIS — L0390 Cellulitis, unspecified: Secondary | ICD-10-CM | POA: Diagnosis not present

## 2019-02-07 DIAGNOSIS — L03115 Cellulitis of right lower limb: Secondary | ICD-10-CM | POA: Diagnosis not present

## 2019-02-27 DIAGNOSIS — E782 Mixed hyperlipidemia: Secondary | ICD-10-CM | POA: Diagnosis not present

## 2019-02-27 DIAGNOSIS — J439 Emphysema, unspecified: Secondary | ICD-10-CM | POA: Diagnosis not present

## 2019-02-27 DIAGNOSIS — N1831 Chronic kidney disease, stage 3a: Secondary | ICD-10-CM | POA: Diagnosis not present

## 2019-02-27 DIAGNOSIS — J4540 Moderate persistent asthma, uncomplicated: Secondary | ICD-10-CM | POA: Diagnosis not present

## 2019-02-27 DIAGNOSIS — F324 Major depressive disorder, single episode, in partial remission: Secondary | ICD-10-CM | POA: Diagnosis not present

## 2019-02-27 DIAGNOSIS — I10 Essential (primary) hypertension: Secondary | ICD-10-CM | POA: Diagnosis not present

## 2019-02-27 DIAGNOSIS — M810 Age-related osteoporosis without current pathological fracture: Secondary | ICD-10-CM | POA: Diagnosis not present

## 2019-02-27 DIAGNOSIS — F0390 Unspecified dementia without behavioral disturbance: Secondary | ICD-10-CM | POA: Diagnosis not present

## 2019-03-20 ENCOUNTER — Other Ambulatory Visit: Payer: Self-pay

## 2019-03-20 ENCOUNTER — Ambulatory Visit (INDEPENDENT_AMBULATORY_CARE_PROVIDER_SITE_OTHER): Payer: PPO | Admitting: Allergy and Immunology

## 2019-03-20 ENCOUNTER — Encounter: Payer: Self-pay | Admitting: Allergy and Immunology

## 2019-03-20 VITALS — BP 142/86 | HR 69 | Temp 96.9°F | Resp 18 | Ht <= 58 in | Wt 148.8 lb

## 2019-03-20 DIAGNOSIS — J4550 Severe persistent asthma, uncomplicated: Secondary | ICD-10-CM | POA: Diagnosis not present

## 2019-03-20 DIAGNOSIS — J3089 Other allergic rhinitis: Secondary | ICD-10-CM | POA: Diagnosis not present

## 2019-03-20 DIAGNOSIS — K219 Gastro-esophageal reflux disease without esophagitis: Secondary | ICD-10-CM | POA: Diagnosis not present

## 2019-03-20 MED ORDER — PANTOPRAZOLE SODIUM 40 MG PO TBEC: 40.0000 mg | DELAYED_RELEASE_TABLET | Freq: Two times a day (BID) | ORAL | 5 refills | Status: DC

## 2019-03-20 MED ORDER — IPRATROPIUM BROMIDE 0.06 % NA SOLN: 1.0000 | Freq: Three times a day (TID) | NASAL | 5 refills | Status: DC

## 2019-03-20 MED ORDER — ALBUTEROL SULFATE HFA 108 (90 BASE) MCG/ACT IN AERS: INHALATION_SPRAY | RESPIRATORY_TRACT | 3 refills | Status: DC

## 2019-03-20 MED ORDER — BREZTRI AEROSPHERE 160-9-4.8 MCG/ACT IN AERO: 2.0000 | INHALATION_SPRAY | Freq: Two times a day (BID) | RESPIRATORY_TRACT | 5 refills | Status: DC

## 2019-03-20 MED ORDER — FAMOTIDINE 20 MG PO TABS: 20.0000 mg | ORAL_TABLET | Freq: Every day | ORAL | 5 refills | Status: DC

## 2019-03-20 NOTE — Progress Notes (Signed)
Tammy [name]   Follow-up Note  Referring Provider: [name], MD Primary Provider: [name], MD Date of Office Visit: 03/20/2019  Subjective:   Tammy [name] (DOB: 08-01-1930) is a 85 y.o. female who returns to the [facility] and [place] on 03/20/2019 in re-evaluation of the following:  HPI: [name] returns to this clinic in evaluation of COPD with component of asthma, history of exercise-induced hypoxemia, allergic rhinitis, and history of LPR.  Her last visit to this clinic was 21 November 2018.  We had a difficult time communicating during today's visit because of her hearing.  I am screaming next to her ear and only half of the conversation is making sense.  Apparently she has done pretty good with her breathing although she cannot afford to use Breo.  Her requirement for short acting bronchodilator is very rare.  It does not sound as though she has required a systemic steroid to treat an exacerbation of her lung condition.  She still has a little bit of mucus occasionally in her throat and in her lungs and was still throat clear or cough to clear this issue.  She believes that her reflux is doing quite well at this point in time.  She has not been having a significant problem with her nose.  She did receive the flu vaccine this year.  Allergies as of 03/20/2019      Reactions   Penicillins Hives   Has patient had a PCN reaction causing immediate rash, facial/tongue/throat swelling, SOB or lightheadedness with hypotension: No Has patient had a PCN reaction causing severe rash involving mucus membranes or skin necrosis: Yes Has patient had a PCN reaction that required hospitalization No Has patient had a PCN reaction occurring within the last 10 years: Yes took again last year 2016 If all of the above answers are "NO", then may proceed with Cephalosporin use.   Naprosyn [naproxen] Nausea Only   diarrhea   Sertraline  Diarrhea   Lactose Intolerance (gi) Other (See Comments)   Unknown- [facility] documented      Medication List      albuterol 108 (90 Base) MCG/ACT inhaler Commonly known as: VENTOLIN HFA INHALE 2 PUFFS BY MOUTH EVERY 4 TO 6 HOURS AS NEEDED FOR COUGH OR WHEEZING   aspirin 81 MG tablet Take 81 mg by mouth daily.   atorvastatin 10 MG tablet Commonly known as: LIPITOR Take 1 tablet (10 mg total) by mouth at bedtime.   augmented betamethasone dipropionate 0.05 % ointment Commonly known as: DIPROLENE-AF APPLY EXTERNALLY ONCE OR TWICE A DAY   Breo Ellipta 200-25 MCG/INH Aepb Generic drug: fluticasone furoate-vilanterol Inhale 1 puff by mouth once daily   donepezil 10 MG tablet Commonly known as: ARICEPT Take 1 tablet (10 mg total) by mouth daily.   DULoxetine 60 MG capsule Commonly known as: CYMBALTA Take 60 mg by mouth daily. Reported on 08/05/2015   famotidine 40 MG tablet Commonly known as: PEPCID TAKE 1 TABLET BY MOUTH ONCE DAILY AT BEDTIME DISCONTINUE RANITIDINE   fluticasone 50 MCG/ACT nasal spray Commonly known as: FLONASE USE 1 TO 2 SPRAY(S) IN EACH NOSTRIL ONCE DAILY FOR  STUFFY  NOSE  OR  DRAINAGE   folic acid 1 MG tablet Commonly known as: FOLVITE Take 1 tablet (1 mg total) by mouth daily.   furosemide 20 MG tablet Commonly known as: LASIX Take 1 tablet (20 mg total) by mouth daily.  irbesartan 300 MG tablet Commonly known as: AVAPRO Take 1 tablet (300 mg total) by mouth daily.   metoprolol tartrate 50 MG tablet Commonly known as: LOPRESSOR Take 50 mg by mouth 2 (two) times daily. Pt reports she is taking this medication   omeprazole 20 MG capsule Commonly known as: PRILOSEC Take 20 mg by mouth 2 (two) times daily before a meal.   Probiotic Daily Caps Take 1 capsule by mouth daily.   Turmeric 500 MG Caps Take 1,000 mg by mouth.   TYLENOL ARTHRITIS EXT RELIEF PO Take by mouth.       Past Medical History:  Diagnosis  Date  . Acid reflux   . Asthma    Asthmatic COPD  . Atrial fibrillation ([place]) 01/01/2016  . Coronary artery disease    Echo 07/27/2011   . Coronary artery stenosis    , High-grade left main  . Dementia ([place])   . Essential tremor 03/16/2018  . GERD (gastroesophageal reflux disease)   . Hyperlipidemia   . Hypertension   . IBS (irritable bowel syndrome)   . Psoriasis     Past Surgical History:  Procedure Laterality Date  . CARDIAC SURGERY    . CORONARY ARTERY BYPASS GRAFT  1994   After catheterization showed 90% ostial left main stenosis  . DOPPLER ECHOCARDIOGRAPHY     Study showed mild to moderate MR with moderate TR, mild to moderate pulmonary hypertension with an ejection fraction greater that 55%.   [name] EYE SURGERY    . Nuclear Perfusion  10/2010, 11/15/2012   Showed normal perfusion, No Ischemia or Infarction, Normal EF  . Renal Scan Duplex  04/09/2012   Showing greater than 50% diameter reduction in the celiac artery and SMA.. Right proximal 275 and Mid 152.  [name] ROTATOR CUFF REPAIR     bilateral  . Sleep Study  12/28/2006   AHI during total sleep time (3h 19 minutes) was 1.81/hr and during REM sleep at 17.78/hr. Mild sleep apnea during REM sleep.Oxygen staturated rate during REM and NREM was 93.0%.    Review of systems negative except as noted in HPI / PMHx or noted below:  Review of Systems  Constitutional: Negative.   HENT: Negative.   Eyes: Negative.   Respiratory: Negative.   Cardiovascular: Negative.   Gastrointestinal: Negative.   Genitourinary: Negative.   Musculoskeletal: Negative.   Skin: Negative.   Neurological: Negative.   Endo/Heme/Allergies: Negative.   Psychiatric/Behavioral: Negative.      Objective:   Vitals:   03/20/19 1515  BP: (!) 142/86  Pulse: 69  Resp: 18  Temp: (!) 96.9 F (36.1 C)  SpO2: 94%   Height: 4\' 9"  (144.8 cm)  Weight: 148 lb 12.8 oz (67.5 kg)   Physical Exam Constitutional:      Appearance: She is not diaphoretic.   HENT:     Head: Normocephalic.     Right Ear: Tympanic membrane, ear canal and external ear normal.     Left Ear: Tympanic membrane, ear canal and external ear normal.     Nose: Nose normal. No mucosal edema or rhinorrhea.     Mouth/Throat:     Pharynx: Uvula midline. No oropharyngeal exudate.  Eyes:     Conjunctiva/sclera: Conjunctivae normal.  Neck:     Thyroid: No thyromegaly.     Trachea: Trachea normal. No tracheal tenderness or tracheal deviation.  Cardiovascular:     Rate and Rhythm: Normal rate and regular rhythm.     Heart sounds: Normal heart  sounds, S1 normal and S2 normal. No murmur.  Pulmonary:     Effort: No respiratory distress.     Breath sounds: Normal breath sounds. No stridor. No wheezing or rales.  Lymphadenopathy:     Head:     Right side of head: No tonsillar adenopathy.     Left side of head: No tonsillar adenopathy.     Cervical: No cervical adenopathy.  Skin:    Findings: No erythema or rash.     Nails: There is no clubbing.   Neurological:     Mental Status: She is alert.     Diagnostics:    Spirometry was performed and demonstrated an FEV1 of 0.96 at 86 % of predicted.  Assessment and Plan:   1. Asthma, severe persistent, well-controlled   2. Perennial allergic rhinitis   3. LPRD (laryngopharyngeal reflux disease)     1. Use the following every day:   A.  Breztri - 2 inhalations 2 times per day (Breo) - SAMPLE  B.  Pantoprazole 40 mg one tablet two times per day  C.  Famotidine 40 mg one time per day - PM  D.  Flonase one spray each nostril 1 time per day  2. If Needed:   A. Ventolin HFA 2 puffs every 4-6 hours  B. Cetirizine 10 mg tablet 1 time per day  C. Ipratropium 0.06%.  1-2 sprays each nostril 3 times a day to dry nose  D. OTC Mucinex -1-2 tablets 1-2 times per day  3. Obtain COVID vaccine when available   4.  Return in January 2021 or earlier if problem   It sounds as though Breeze has been stable since her last visit  with me although her stability is defined by having some chronic cough and mucus production and not being able to exert herself very well.  She is having trouble paying for [facility] and will give her a sample of Breztri to try in its place.  She will continue to use anti-inflammatory agents for her airway and therapy directed against reflux as noted above and I will see her back in this clinic in January 2021 or earlier if there is a problem.  Allena Katz, MD Allergy / Immunology [place]

## 2019-03-20 NOTE — Patient Instructions (Addendum)
  1. Use the following every day:   A.  Breztri - 2 inhalations 2 times per day (Breo) - SAMPLE  B.  Pantoprazole 40 mg one tablet two times per day  C.  Famotidine 40 mg one time per day - PM  D.  Flonase one spray each nostril 1 time per day  2. If Needed:   A. Ventolin HFA 2 puffs every 4-6 hours  B. Cetirizine 10 mg tablet 1 time per day  C. Ipratropium 0.06%.  1-2 sprays each nostril 3 times a day to dry  D. OTC Mucinex-1-2 tablets 1-2 times per day nose  3. Obtain COVID vaccine when available   4.  Return in January 2021 or earlier if problem

## 2019-03-21 ENCOUNTER — Telehealth: Payer: Self-pay | Admitting: Allergy and Immunology

## 2019-03-21 ENCOUNTER — Encounter: Payer: Self-pay | Admitting: Allergy and Immunology

## 2019-03-21 NOTE — Telephone Encounter (Signed)
I have submitted the prior auth via [email or web address]. Currently awaiting approval/denial.

## 2019-03-21 NOTE — Telephone Encounter (Signed)
Received a coverage determination request form. Paper work was filled out and sign and I will fax to company to get [place] approved. Fax number is [phone].

## 2019-03-21 NOTE — Telephone Encounter (Signed)
Cover [employer]s called about a prior auth for [employer]. Reference Key # if you want to send electronically is [id number].

## 2019-03-22 NOTE — Telephone Encounter (Addendum)
Fax from [place]:  Tammy [name] has been approved Approval dates:  Until 05/16/2020 [facility]  [phone]

## 2019-03-26 ENCOUNTER — Other Ambulatory Visit: Payer: Self-pay | Admitting: Allergy and Immunology

## 2019-03-28 ENCOUNTER — Telehealth: Payer: Self-pay

## 2019-03-28 ENCOUNTER — Other Ambulatory Visit: Payer: Self-pay | Admitting: Neurology

## 2019-03-28 NOTE — Telephone Encounter (Signed)
Prior auth for albuterol hfa (Ventolin HFA) submitted via covermymeds. Awaiting approval/denial notification.

## 2019-03-29 NOTE — Telephone Encounter (Signed)
Approved and sent to the pharmacy.  

## 2019-04-27 DIAGNOSIS — F324 Major depressive disorder, single episode, in partial remission: Secondary | ICD-10-CM | POA: Diagnosis not present

## 2019-04-27 DIAGNOSIS — M810 Age-related osteoporosis without current pathological fracture: Secondary | ICD-10-CM | POA: Diagnosis not present

## 2019-04-27 DIAGNOSIS — E782 Mixed hyperlipidemia: Secondary | ICD-10-CM | POA: Diagnosis not present

## 2019-04-27 DIAGNOSIS — I10 Essential (primary) hypertension: Secondary | ICD-10-CM | POA: Diagnosis not present

## 2019-04-27 DIAGNOSIS — F0390 Unspecified dementia without behavioral disturbance: Secondary | ICD-10-CM | POA: Diagnosis not present

## 2019-04-27 DIAGNOSIS — J439 Emphysema, unspecified: Secondary | ICD-10-CM | POA: Diagnosis not present

## 2019-04-27 DIAGNOSIS — J4540 Moderate persistent asthma, uncomplicated: Secondary | ICD-10-CM | POA: Diagnosis not present

## 2019-05-03 DIAGNOSIS — I10 Essential (primary) hypertension: Secondary | ICD-10-CM | POA: Diagnosis not present

## 2019-05-03 DIAGNOSIS — M25559 Pain in unspecified hip: Secondary | ICD-10-CM | POA: Diagnosis not present

## 2019-05-03 DIAGNOSIS — M25519 Pain in unspecified shoulder: Secondary | ICD-10-CM | POA: Diagnosis not present

## 2019-05-03 DIAGNOSIS — N183 Chronic kidney disease, stage 3 unspecified: Secondary | ICD-10-CM | POA: Diagnosis not present

## 2019-05-03 DIAGNOSIS — J4540 Moderate persistent asthma, uncomplicated: Secondary | ICD-10-CM | POA: Diagnosis not present

## 2019-05-22 ENCOUNTER — Other Ambulatory Visit: Payer: Self-pay

## 2019-05-22 ENCOUNTER — Telehealth: Payer: Self-pay

## 2019-05-22 NOTE — Telephone Encounter (Signed)
Pt is calling asking to go back to [facility] because she does not want to have to brush her teeth twice a day. She states that she was happy with the [facility] and would like to go back.   Please advise:

## 2019-05-23 DIAGNOSIS — M25511 Pain in right shoulder: Secondary | ICD-10-CM | POA: Diagnosis not present

## 2019-05-23 DIAGNOSIS — M25512 Pain in left shoulder: Secondary | ICD-10-CM | POA: Diagnosis not present

## 2019-05-23 MED ORDER — BREO ELLIPTA 200-25 MCG/INH IN AEPB: 1.0000 | INHALATION_SPRAY | Freq: Every day | RESPIRATORY_TRACT | 5 refills | Status: DC

## 2019-05-23 NOTE — Telephone Encounter (Signed)
Please inform patient that we can use BREO 200 - 1 inhalation 1 time per day

## 2019-05-23 NOTE — Telephone Encounter (Signed)
Called patient to let her know that it is fine to use BREO 200- 1 inhalation, once a day. Patient voiced understanding. Sending in RX for BREO 200 to [facility] on [address].

## 2019-05-28 ENCOUNTER — Ambulatory Visit: Payer: PPO | Admitting: Physician Assistant

## 2019-06-07 ENCOUNTER — Other Ambulatory Visit: Payer: Self-pay | Admitting: Allergy and Immunology

## 2019-06-11 ENCOUNTER — Ambulatory Visit: Payer: PPO | Attending: Internal Medicine

## 2019-06-11 DIAGNOSIS — Z23 Encounter for immunization: Secondary | ICD-10-CM | POA: Insufficient documentation

## 2019-06-11 NOTE — Progress Notes (Signed)
   Covid-19 Vaccination Clinic  Name:  Tammy [name]    MRN: [id number] DOB: 15-Jul-1930  06/11/2019  [name] was observed post Covid-19 immunization for 15 minutes without incidence. She was provided with Vaccine Information Sheet and instruction to access the V-Safe system.   [name] was instructed to call 911 with any severe reactions post vaccine: [name] Difficulty breathing  . Swelling of your face and throat  . A fast heartbeat  . A bad rash all over your body  . Dizziness and weakness    Immunizations Administered    Name Date Dose VIS Date Route   Pfizer COVID-19 Vaccine 06/11/2019 12:08 PM 0.3 mL 04/27/2019 Intramuscular   Manufacturer: [place]   Lot: [id number]   [place]: [id number]

## 2019-06-12 ENCOUNTER — Ambulatory Visit: Payer: PPO | Admitting: Allergy and Immunology

## 2019-06-13 DIAGNOSIS — M25611 Stiffness of right shoulder, not elsewhere classified: Secondary | ICD-10-CM | POA: Diagnosis not present

## 2019-06-13 DIAGNOSIS — M25612 Stiffness of left shoulder, not elsewhere classified: Secondary | ICD-10-CM | POA: Diagnosis not present

## 2019-06-21 DIAGNOSIS — M545 Low back pain: Secondary | ICD-10-CM | POA: Diagnosis not present

## 2019-06-21 DIAGNOSIS — M25552 Pain in left hip: Secondary | ICD-10-CM | POA: Diagnosis not present

## 2019-06-22 ENCOUNTER — Other Ambulatory Visit: Payer: Self-pay

## 2019-06-22 ENCOUNTER — Ambulatory Visit: Payer: PPO | Admitting: Cardiovascular Disease

## 2019-06-22 VITALS — BP 160/72 | HR 95 | Ht <= 58 in | Wt 143.5 lb

## 2019-06-22 DIAGNOSIS — I10 Essential (primary) hypertension: Secondary | ICD-10-CM

## 2019-06-22 DIAGNOSIS — K219 Gastro-esophageal reflux disease without esophagitis: Secondary | ICD-10-CM | POA: Diagnosis not present

## 2019-06-22 DIAGNOSIS — E785 Hyperlipidemia, unspecified: Secondary | ICD-10-CM | POA: Diagnosis not present

## 2019-06-22 DIAGNOSIS — F0390 Unspecified dementia without behavioral disturbance: Secondary | ICD-10-CM | POA: Diagnosis not present

## 2019-06-22 DIAGNOSIS — I272 Pulmonary hypertension, unspecified: Secondary | ICD-10-CM | POA: Diagnosis not present

## 2019-06-22 DIAGNOSIS — R0602 Shortness of breath: Secondary | ICD-10-CM

## 2019-06-22 DIAGNOSIS — I25119 Atherosclerotic heart disease of native coronary artery with unspecified angina pectoris: Secondary | ICD-10-CM | POA: Diagnosis not present

## 2019-06-22 DIAGNOSIS — Z951 Presence of aortocoronary bypass graft: Secondary | ICD-10-CM | POA: Diagnosis not present

## 2019-06-22 MED ORDER — METOPROLOL TARTRATE 50 MG PO TABS: ORAL_TABLET | ORAL | 3 refills | Status: DC

## 2019-06-22 MED ORDER — AMLODIPINE BESYLATE 2.5 MG PO TABS: ORAL_TABLET | ORAL | 1 refills | Status: DC

## 2019-06-22 NOTE — Progress Notes (Signed)
Patient ID: Tammy [name], female   DOB: 07/02/1930, 86 y.o.   MRN: [id number]     Primary M.D.: Tammy [name]  HPI: Tammy [name] is a 86 y.o. female who presents to the office today for an 8 month followup cardiology evaluation.   Tammy [name] has established CAD andunderwent CABG surgery in 1994 after she was found to have high-grade left main stenosis. A nuclear perfusion study in June 2011 showed normal perfusion. An echo Doppler study in March 2013 showed mild-to-moderate mitral regurgitation, moderate tricuspid regurgitation, mild-to-moderate pulmonary hypertension with estimated RV systolic pressure 45 mm, mild to moderate LA dilatation, aortic sclerosis/calcification, and an ejection fraction greater than 55%. She has documented right renal artery stenosis on renal duplex scanning with the last scan in November 2013 suggesting a equal or greater than 60% diameter reduction by velocity in the right renal artery. The kidneys are otherwise normal in size, symmetrical in shape and had normal cortex and medulla. She does have a history of  hypertension and hyperlipidemia. In the past she also has had some issues with cellulitis of her lower extremity.   A two-year followup nuclear perfusion study on 11/16/2011; no significant EKG changes were demonstrated. The study was low risk and did not show any area of scar or infarction. This is unchanged from 2 years previously.  She has had issues with lower extremity edema and this improved with reduction of her amlodipine dose to 5 mg from 10 mg. A follow-up echo Doppler study continued to show normal systolic function.  There was mild left atrial dilatation and mild pulmonary hypertension with a PA pressure slightly improved from previously at 38 mm.  She did not have significant valvular abnormality.  A follow-up renal duplex study was unchanged from her prior study of one year previously and showed equal to less than 6% diameter reduction in the right renal  artery.  She had normal patency to the left renal artery.  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   She underwent a follow-up nuclear perfusion study in July 2016.  This was normal without scar or ischemia.  Ejection fraction was 74% and she had normal wall motion.  She did not develop any ECG changes.  She was hospitalized September and in October 2017.  She had episodes of lightheadedness upon standing, nausea, lower abdominal pain and confusion.  He was found to have renal failure acutely and was treated with intravenous fluids.  He had increased to 1.74.  He has a history of atrial fibrillation but was felt not to be a candidate for anticoagulation due to fall risk.  Her beta blocker had been discontinued due to bradycardia.  When she was admitted to [facility] from October 5 through 02/22/2016 she was treated with Cardizem.  She had an unremarkable CT of her head and neck.  She broke her wrist before Christmas.  She has been followed by Dr. Gus [name], for primary care.   When I saw her in October 2018 her blood pressure was elevated. In the past, she apparently had some issues with diltiazem.  She was back on metoprolol.  With her continued blood pressure elevation.  I suggested the addition of amlodipine 5 mg.  Apparently there is no record that this ever was filled.  She is no longer fully aware of the medicines she is taking since her daughter provides her medicine for her.  She was on metoprolol tartrate 50 mg twice a day,  irbesartan 150 mg daily for hypertension, low-dose atorvastatin 10 mg for hyperlipidemia.  She is on Aricept.  There is a history of depression for which she's on Cymbalta.  She denied recent wheezing on when necessary albuterol.  In addition to breo-ellipta.    I saw her in December 2019 at which time she denied any episodes of chest pain.  She was last evaluated in a telemedicine visit in June 2020.  She was on supplemental oxygen.  She denied  significant leg swelling.  She continues to be on atorvastatin for hyperlipidemia.  His leg edema had resolved.  Since her last evaluation, she denies any major change in shortness of breath.  There is no chest pain.  She has not been to exercise.  Her blood pressure has been increased despite being on furosemide 20 mg daily, metoprolol 50 mg twice a day.  Her resting pulse has been elevated in the 90s.  She presents for reevaluation.  Past Medical History:  Diagnosis Date  . Acid reflux   . Asthma    Asthmatic COPD  . Atrial fibrillation ([place]) 01/01/2016  . Coronary artery disease    Echo 07/27/2011   . Coronary artery stenosis    , High-grade left main  . Dementia ([place])   . Essential tremor 03/16/2018  . GERD (gastroesophageal reflux disease)   . Hyperlipidemia   . Hypertension   . IBS (irritable bowel syndrome)   . Psoriasis     Past Surgical History:  Procedure Laterality Date  . CARDIAC SURGERY    . CORONARY ARTERY BYPASS GRAFT  1994   After catheterization showed 90% ostial left main stenosis  . DOPPLER ECHOCARDIOGRAPHY     Study showed mild to moderate MR with moderate TR, mild to moderate pulmonary hypertension with an ejection fraction greater that 55%.   Tammy [name] EYE SURGERY    . Nuclear Perfusion  10/2010, 11/15/2012   Showed normal perfusion, No Ischemia or Infarction, Normal EF  . Renal Scan Duplex  04/09/2012   Showing greater than 50% diameter reduction in the celiac artery and SMA.. Right proximal 275 and Mid 152.  Tammy [name] ROTATOR CUFF REPAIR     bilateral  . Sleep Study  12/28/2006   AHI during total sleep time (3h 19 minutes) was 1.81/hr and during REM sleep at 17.78/hr. Mild sleep apnea during REM sleep.Oxygen staturated rate during REM and NREM was 93.0%.    Allergies  Allergen Reactions  . Penicillins Hives    Has patient had a PCN reaction causing immediate rash, facial/tongue/throat swelling, SOB or lightheadedness with hypotension: No Has patient had a PCN  reaction causing severe rash involving mucus membranes or skin necrosis: Yes Has patient had a PCN reaction that required hospitalization No Has patient had a PCN reaction occurring within the last 10 years: Yes took again last year 2016 If all of the above answers are "NO", then may proceed with Cephalosporin use.   . Naprosyn [Naproxen] Nausea Only    diarrhea  . Sertraline Diarrhea  . Lactose Intolerance (Gi) Other (See Comments)    Unknown- [facility] documented    Current Outpatient Medications  Medication Sig Dispense Refill  . Acetaminophen (TYLENOL ARTHRITIS EXT RELIEF PO) Take by mouth.    Tammy [name] albuterol (VENTOLIN HFA) 108 (90 Base) MCG/ACT inhaler INHALE 2 PUFFS BY MOUTH EVERY 4 TO 6 HOURS AS NEEDED FOR COUGH OR  WHEEZING 18 g 3  . aspirin 81 MG tablet Take 81 mg by mouth  daily.     . augmented betamethasone dipropionate (DIPROLENE-AF) 0.05 % ointment APPLY EXTERNALLY ONCE OR TWICE A DAY    . Budeson-Glycopyrrol-Formoterol (BREZTRI AEROSPHERE) 160-9-4.8 MCG/ACT AERO Inhale 2 puffs into the lungs 2 (two) times daily. 10.7 g 5  . donepezil (ARICEPT) 10 MG tablet Take 1 tablet by mouth once daily 90 tablet 0  . DULoxetine (CYMBALTA) 60 MG capsule Take 60 mg by mouth daily. Reported on 08/05/2015  2  . fluticasone (FLONASE) 50 MCG/ACT nasal spray USE 1 TO 2 SPRAY(S) IN EACH NOSTRIL ONCE DAILY FOR  STUFFY  NOSE  OR  DRAINAGE 16 g 1  . fluticasone furoate-vilanterol (BREO ELLIPTA) 200-25 MCG/INH AEPB Inhale 1 puff into the lungs daily. 30 each 5  . folic acid (FOLVITE) 1 MG tablet Take 1 tablet (1 mg total) by mouth daily.    . furosemide (LASIX) 20 MG tablet Take 1 tablet (20 mg total) by mouth daily. 90 tablet 3  . ipratropium (ATROVENT) 0.06 % nasal spray Place 1-2 sprays into both nostrils 3 (three) times daily. As needed. 15 mL 5  . metoprolol tartrate (LOPRESSOR) 50 MG tablet Take 1 and 1/2 tablet in the morning (19m) and 1 tablet at night (561m 225 tablet 3   . omeprazole (PRILOSEC) 20 MG capsule Take 20 mg by mouth 2 (two) times daily before a meal.    . pantoprazole (PROTONIX) 40 MG tablet Take 1 tablet (40 mg total) by mouth 2 (two) times daily. 60 tablet 5  . Probiotic Product (PROBIOTIC DAILY) CAPS Take 1 capsule by mouth daily.    . Turmeric 500 MG CAPS Take 1,000 mg by mouth.    . Tammy KitchenmLODipine (NORVASC) 2.5 MG tablet Take at bed time 30 tablet 1   No current facility-administered medications for this visit.    Socially , she is divorced.  She denies any recent tobacco use. She has not been able to exercise regularly. pressu ROS General: Negative; No fevers, chills, or night sweats; was in for fatigue. HEENT: Negative; No changes in vision or hearing, sinus congestion, difficulty swallowing Pulmonary: Negative; No cough, wheezing, shortness of breath, hemoptysis Cardiovascular: See history of present illness;  GI: Positive for diverticulitis, and frequent diarrhea;  GU: Positive for renal artery stenosis; No dysuria, hematuria, or difficulty voiding Musculoskeletal: Right wrist fracture. Hematologic/Oncology: Negative; no easy bruising, bleeding Endocrine: Negative; no heat/cold intolerance; no diabetes Neuro: Positive for memory loss, now on Aricept Skin: Negative; No rashes or skin lesions Psychiatric: Negative; No behavioral problems, depression Sleep: Negative; No snoring, daytime sleepiness, hypersomnolence, bruxism, restless legs, hypnogognic hallucinations, no cataplexy Other comprehensive 14 point system review is negative.   PE BP (!) 160/72   Pulse 95   Ht 4' 9"  (1.448 m)   Wt 143 lb 8 oz (65.1 kg)   BMI 31.05 kg/m    Repeat blood pressure by me was elevated at 164/74  Wt Readings from Last 3 Encounters:  06/22/19 143 lb 8 oz (65.1 kg)  03/20/19 148 lb 12.8 oz (67.5 kg)  04/17/18 150 lb 3.2 oz (68.1 kg)   General: Alert, oriented, no distress.  Skin: normal turgor, no rashes, warm and dry HEENT: Normocephalic,  atraumatic. Pupils equal round and reactive to light; sclera anicteric; extraocular muscles intact;  Nose without nasal septal hypertrophy Mouth/Parynx benign; Mallinpatti scale 3 Neck: No JVD, no carotid bruits; normal carotid upstroke Lungs: clear to ausculatation and percussion; no wheezing or rales Chest wall: without tenderness to palpitation Heart: PMI not displaced,  RRR, s1 s2 normal, 1/6 systolic murmur, no diastolic murmur, no rubs, gallops, thrills, or heaves Abdomen: soft, nontender; no hepatosplenomehaly, BS+; abdominal aorta nontender and not dilated by palpation. Back: no CVA tenderness Pulses 2+ Musculoskeletal: full range of motion, normal strength, no joint deformities Extremities: Lower extremity edema resolved ;no clubbing cyanosis , Homan's sign negative  Neurologic: grossly nonfocal; Cranial nerves grossly wnl Psychologic: Normal mood and affect   ECG (independently read by me): Normal sinus rhythm at 95 bpm.  Nonspecific ST changes.  Normal intervals.  No ectopy  ECG (independently read by me): Normal sinus rhythm at 72 bpm.  No significant ST-T changes.  Normal intervals.  April 2019 ECG (independently read by me): normal sinus rhythm at 63 bpm.  No ectopy.  Normal intervals.  October 2018 ECG (independently read by me): Normal sinus rhythm at 87 bpm.  Nonspecific ST changes.  QTc interval 450 ms.  March 2018 ECG (independently read by me): Normal sinus rhythm at 78 bpm.  No ST segment changes.  Normal intervals.  April 2017 ECG (independently read by me): Normal sinus rhythm at 68 bpm.  Normal intervals.  No ST segment changes.  July 2016 ECG (independently read by me): Normal sinus rhythm at 70 bpm.  Nonspecific ST changes.  PR interval 178 ms.  QTc interval 429 ms.  November 02, 2013 ECG (independently read by me): Sinus bradycardia 55 beats per minute.  Nonspecific ST-T changes.  QTc interval 430 ms.  Borderline first degree AV block with a PR interval of 202 ms.   Prior September 2014 ECG: Normal sinus rhythm at 68 beats per minute. One isolated PVC. Nonspecific ST changes.  LABS:  BMP Latest Ref Rng & Units 12/09/2016 12/08/2016 02/21/2016  Glucose 65 - 99 mg/dL 133(H) 96 108(H)  BUN 6 - 20 mg/dL 11 14 11   Creatinine 0.44 - 1.00 mg/dL 0.99 0.88 0.84  Sodium 135 - 145 mmol/L 137 136 139  Potassium 3.5 - 5.1 mmol/L 3.7 4.4 3.6  Chloride 101 - 111 mmol/L 107 103 111  CO2 22 - 32 mmol/L 22 23 22   Calcium 8.9 - 10.3 mg/dL 8.3(L) 8.7(L) 8.7(L)   Hepatic Function Latest Ref Rng & Units 03/16/2018 12/08/2016 02/21/2016  Total Protein 6.0 - 8.5 g/dL 6.3 6.5 5.7(L)  Albumin 3.5 - 5.0 g/dL - 3.3(L) 2.9(L)  AST 15 - 41 U/L - 22 24  ALT 14 - 54 U/L - 18 33  Alk Phosphatase 38 - 126 U/L - 78 75  Total Bilirubin 0.3 - 1.2 mg/dL - 0.4 0.4  Bilirubin, Direct 0.1 - 0.5 mg/dL - - -   CBC Latest Ref Rng & Units 12/09/2016 12/08/2016 02/21/2016  WBC 4.0 - 10.5 K/uL 10.9(H) 13.5(H) 9.7  Hemoglobin 12.0 - 15.0 g/dL 11.3(L) 12.3 10.4(L)  Hematocrit 36.0 - 46.0 % 36.4 39.1 33.9(L)  Platelets 150 - 400 K/uL 260 317 275   Lab Results  Component Value Date   MCV 82.4 12/09/2016   MCV 82.5 12/08/2016   MCV 91.6 02/21/2016   Lab Results  Component Value Date   TSH 1.963 02/20/2016   Lab Results  Component Value Date   HGBA1C  07/12/2009    5.3 (NOTE) The ADA recommends the following therapeutic goal for glycemic control related to Hgb A1c measurement: Goal of therapy: <6.5 Hgb A1c  Reference: American Diabetes Association: Clinical Practice Recommendations 2010, Diabetes Care, 2010, 33: (Suppl  1).   Lipid Panel     Component Value Date/Time  CHOL 118 01/03/2016 0304   CHOL 128 11/09/2012 0805   TRIG 106 01/03/2016 0304   TRIG 84 11/09/2012 0805   HDL 43 01/03/2016 0304   HDL 50 11/09/2012 0805   CHOLHDL 2.7 01/03/2016 0304   VLDL 21 01/03/2016 0304   LDLCALC 54 01/03/2016 0304   LDLCALC 61 11/09/2012 0805    RADIOLOGY: No results found.   IMPRESSION:  1. Essential hypertension   2. Coronary artery disease involving native coronary artery of native heart with angina pectoris ([place])   3. Hx of CABG   4. Shortness of breath   5. Hyperlipidemia with target LDL less than 70   6. Pulmonary hypertension ([place])   7. Dementia without behavioral disturbance, unspecified dementia type ([place])   8. Gastroesophageal reflux disease without esophagitis     ASSESSMENT AND PLAN: Ms. Treiber is an 86 year-old white female who has CAD and underwent CABG revascularization surgery in 1994. She has had issues with significant fatigue and mild shortness of breath with activity.  An echo Doppler study in June 2015 was not significantly change from previously.  PA pressure was elevated, but improved at 38 mm compared to 45 mmHg in 2013.  A nuclear perfusion study in July 2016 continued to show normal perfusion without scar or ischemia.  Her last echo Doppler study in May 2017 showed normal LV size with mild focal basal septal hypertrophy.  EF was 60-65%.  She had normal RV size and systolic function and there were no significant valvular abnormalities.  Her blood pressure today is elevated and her resting pulse is in the upper 90s despite taking metoprolol tartrate 50 mg twice a day and furosemide 20 mg daily.  She has had resolution of prior edema.  I am recommending slight titration of metoprolol to 5 mg in the morning but she will continue to 50 mg in the evening dose.  In the evening I am suggesting the addition of amlodipine 2.5 mg at bedtime.  We discussed target pressure less than 130/80 with ideal blood pressure less than 120/80.  Her lungs are clear without rales or wheezes.  She continues to be on Brio Ellipta and Atrovent.  She is on atorvastatin.  LDL cholesterol in June 2020 was 76.  She has stable renal function with creatinine of 0.95.  She continues to be on Aricept for dementia.  Her GERD is controlled with pantoprazol. I will see her in 2 months for  follow-up evaluation or sooner as necessary   Time spent: 25 minutes [name], MD, [facility]  06/24/2019 1:12 PM

## 2019-06-22 NOTE — Patient Instructions (Addendum)
Medication Instructions:  INCREASE YOUR METOPROLOL TARTRATE= TAKE 1 AND 1/2 TABLETS IN THE MORNING (75MG ) AND TAKE 1 TABLET IN THE EVENING (50MG )  BEGIN TAKING AMLODIPINE 2.5 MG (1 TABLET) AT BEDTIME.  *If you need a refill on your cardiac medications before your next appointment, please call your pharmacy*  Testing/Procedures: Your physician has requested that you have an echocardiogram. Echocardiography is a painless test that uses sound waves to create images of your heart. It provides your doctor with information about the size and shape of your heart and how well your heart's chambers and valves are working. This procedure takes approximately one hour. There are no restrictions for this procedure.  [place]  Follow-Up: At [facility], you and your health needs are our priority.  As part of our continuing mission to provide you with exceptional heart care, we have created designated Provider Care Teams.  These Care Teams include your primary Cardiologist (physician) and Advanced Practice Providers (APPs -  Physician Assistants and Nurse Practitioners) who all work together to provide you with the care you need, when you need it.  Your next appointment:   6-8 week(s)  The format for your next appointment:   In Person  Provider:   Shelva Majestic, MD

## 2019-06-24 ENCOUNTER — Encounter: Payer: Self-pay | Admitting: Cardiovascular Disease

## 2019-06-28 DIAGNOSIS — M25552 Pain in left hip: Secondary | ICD-10-CM | POA: Diagnosis not present

## 2019-07-02 ENCOUNTER — Ambulatory Visit: Payer: PPO | Attending: Internal Medicine

## 2019-07-02 DIAGNOSIS — Z23 Encounter for immunization: Secondary | ICD-10-CM | POA: Insufficient documentation

## 2019-07-02 NOTE — Progress Notes (Signed)
   Covid-19 Vaccination Clinic  Name:  Tammy [name]    MRN: [id number] DOB: [birth year]  07/02/2019  [name] was observed post Covid-19 immunization for 15 minutes without incidence. She was provided with Vaccine Information Sheet and instruction to access the V-Safe system.   [name] was instructed to call 911 with any severe reactions post vaccine: [name] Difficulty breathing  . Swelling of your face and throat  . A fast heartbeat  . A bad rash all over your body  . Dizziness and weakness    Immunizations Administered    Name Date Dose VIS Date Route   Pfizer COVID-19 Vaccine 07/02/2019 11:23 AM 0.3 mL 04/27/2019 Intramuscular   Manufacturer: [place]   Lot: [id number]   [place]: [id number]

## 2019-07-03 DIAGNOSIS — F324 Major depressive disorder, single episode, in partial remission: Secondary | ICD-10-CM | POA: Diagnosis not present

## 2019-07-03 DIAGNOSIS — M810 Age-related osteoporosis without current pathological fracture: Secondary | ICD-10-CM | POA: Diagnosis not present

## 2019-07-03 DIAGNOSIS — I10 Essential (primary) hypertension: Secondary | ICD-10-CM | POA: Diagnosis not present

## 2019-07-03 DIAGNOSIS — F0390 Unspecified dementia without behavioral disturbance: Secondary | ICD-10-CM | POA: Diagnosis not present

## 2019-07-03 DIAGNOSIS — J4540 Moderate persistent asthma, uncomplicated: Secondary | ICD-10-CM | POA: Diagnosis not present

## 2019-07-03 DIAGNOSIS — J439 Emphysema, unspecified: Secondary | ICD-10-CM | POA: Diagnosis not present

## 2019-07-03 DIAGNOSIS — E782 Mixed hyperlipidemia: Secondary | ICD-10-CM | POA: Diagnosis not present

## 2019-07-06 ENCOUNTER — Other Ambulatory Visit (HOSPITAL_COMMUNITY): Payer: PPO

## 2019-07-19 ENCOUNTER — Ambulatory Visit (HOSPITAL_COMMUNITY): Payer: PPO | Attending: Cardiology

## 2019-07-19 ENCOUNTER — Other Ambulatory Visit: Payer: Self-pay

## 2019-07-19 DIAGNOSIS — I10 Essential (primary) hypertension: Secondary | ICD-10-CM

## 2019-07-19 DIAGNOSIS — I25119 Atherosclerotic heart disease of native coronary artery with unspecified angina pectoris: Secondary | ICD-10-CM | POA: Diagnosis not present

## 2019-07-19 DIAGNOSIS — I272 Pulmonary hypertension, unspecified: Secondary | ICD-10-CM | POA: Insufficient documentation

## 2019-07-19 DIAGNOSIS — R0602 Shortness of breath: Secondary | ICD-10-CM | POA: Diagnosis not present

## 2019-07-24 ENCOUNTER — Telehealth: Payer: Self-pay

## 2019-07-24 NOTE — Telephone Encounter (Signed)
-----   Message from [place], [place] sent at 07/23/2019  4:07 PM EST ----- This echo was ordered by Dr. Claiborne Billings and sent to my inbox for some reason. Normal pumping functioning, moderately thickening of the heart wall, mild mitral valve leakage.

## 2019-07-24 NOTE — Telephone Encounter (Signed)
Tried calling the patient on the number listed for her mobile number. The voicemail states that "This is Tammy [name] please leave a message at the beep and I will return your call." Could not leave a message for the voicemail box is full. Will try calling again.

## 2019-08-06 ENCOUNTER — Telehealth (INDEPENDENT_AMBULATORY_CARE_PROVIDER_SITE_OTHER): Payer: PPO | Admitting: Cardiovascular Disease

## 2019-08-06 ENCOUNTER — Telehealth: Payer: Self-pay

## 2019-08-06 ENCOUNTER — Encounter: Payer: Self-pay | Admitting: Cardiovascular Disease

## 2019-08-06 DIAGNOSIS — I272 Pulmonary hypertension, unspecified: Secondary | ICD-10-CM

## 2019-08-06 DIAGNOSIS — Z951 Presence of aortocoronary bypass graft: Secondary | ICD-10-CM

## 2019-08-06 DIAGNOSIS — Z7189 Other specified counseling: Secondary | ICD-10-CM | POA: Diagnosis not present

## 2019-08-06 DIAGNOSIS — Z87891 Personal history of nicotine dependence: Secondary | ICD-10-CM

## 2019-08-06 DIAGNOSIS — R0602 Shortness of breath: Secondary | ICD-10-CM | POA: Diagnosis not present

## 2019-08-06 DIAGNOSIS — I25119 Atherosclerotic heart disease of native coronary artery with unspecified angina pectoris: Secondary | ICD-10-CM

## 2019-08-06 DIAGNOSIS — I10 Essential (primary) hypertension: Secondary | ICD-10-CM | POA: Diagnosis not present

## 2019-08-06 DIAGNOSIS — R5383 Other fatigue: Secondary | ICD-10-CM

## 2019-08-06 MED ORDER — METOPROLOL TARTRATE 50 MG PO TABS: 50.0000 mg | ORAL_TABLET | Freq: Two times a day (BID) | ORAL | 3 refills | Status: DC

## 2019-08-06 NOTE — Telephone Encounter (Signed)
Called and spoke with pt to review AVS from today's virtual visit with Dr.Kelly. it was very difficult to understand what the patient was saying over the phone but was able to have her verify that Dr.Kelly wants her to decrease her Metoprolol to 50mg  twice a day and that we have scheduled her an in office visit with Roby Lofts PA on 08/15/19 at 3:15pm No other questions at this time. Will mail avs.

## 2019-08-06 NOTE — Patient Instructions (Signed)
Medication Instructions:  DECREASE YOUR METOPROLOL TO 50MG  TWICE A DAY ( 1 TABLET TWICE A DAY)  *If you need a refill on your cardiac medications before your next appointment, please call your pharmacy*   Follow-Up: At [facility], you and your health needs are our priority.  As part of our continuing mission to provide you with exceptional heart care, we have created designated Provider Care Teams.  These Care Teams include your primary Cardiologist (physician) and Advanced Practice Providers (APPs -  Physician Assistants and Nurse Practitioners) who all work together to provide you with the care you need, when you need it.  We recommend signing up for the patient portal called "MyChart".  Sign up information is provided on this After Visit Summary.  MyChart is used to connect with patients for Virtual Visits (Telemedicine).  Patients are able to view lab/test results, encounter notes, upcoming appointments, etc.  Non-urgent messages can be sent to your provider as well.   To learn more about what you can do with MyChart, go to [email or web address].    Your next appointment:   08/15/2019 AT 3:15PM WITH KRISTA KROEGER

## 2019-08-06 NOTE — Progress Notes (Signed)
Virtual Visit via Telephone Note   This visit type was conducted due to national recommendations for restrictions regarding the COVID-19 Pandemic (e.g. social distancing) in an effort to limit this patient's exposure and mitigate transmission in our community.  Due to her co-morbid illnesses, this patient is at least at moderate risk for complications without adequate follow up.  This format is felt to be most appropriate for this patient at this time.  The patient did not have access to video technology/had technical difficulties with video requiring transitioning to audio format only (telephone).  All issues noted in this document were discussed and addressed.  No physical exam could be performed with this format.  Please refer to the patient's chart for her  consent to telehealth for [facility].   The patient was identified using 2 identifiers.  Date:  08/06/2019   ID:  Tammy [name], Tammy [name] 11/24/1930, MRN [id number]  Patient Location: Home Provider Location: Office  PCP:  [name], MD  Cardiologist:  Shelva Majestic, MD  Electrophysiologist:  None   Evaluation Performed:  Follow-Up Visit  Chief Complaint: 6-week follow-up evaluation  History of Present Illness:    Tammy [name] is a 86 y.o. female who has established CAD and underwent CABG surgery in 1994 after she was found to have high-grade left main stenosis. A nuclear perfusion study in June 2011 showed normal perfusion. An echo Doppler study in March 2013 showed mild-to-moderate mitral regurgitation, moderate tricuspid regurgitation, mild-to-moderate pulmonary hypertension with estimated RV systolic pressure 45 mm, mild to moderate LA dilatation, aortic sclerosis/calcification, and an ejection fraction greater than 55%. She has documented right renal artery stenosis on renal duplex scanning with the last scan in November 2013 suggesting a equal or greater than 60% diameter reduction by velocity in the right renal artery.  The kidneys are otherwise normal in size, symmetrical in shape and had normal cortex and medulla. She does have a history of  hypertension and hyperlipidemia. In the past she also has had some issues with cellulitis of her lower extremity.   A two-year followup nuclear perfusion study on 11/16/2011; no significant EKG changes were demonstrated. The study was low risk and did not show any area of scar or infarction. This is unchanged from 2 years previously.  She has had issues with lower extremity edema and this improved with reduction of her amlodipine dose to 5 mg from 10 mg. A follow-up echo Doppler study continued to show normal systolic function.  There was mild left atrial dilatation and mild pulmonary hypertension with a PA pressure slightly improved from previously at 38 mm.  She did not have significant valvular abnormality.  A follow-up renal duplex study was unchanged from her prior study of one year previously and showed equal to less than 6% diameter reduction in the right renal artery.  She had normal patency to the left renal artery.  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   She underwent a follow-up nuclear perfusion study in July 2016.  This was normal without scar or ischemia.  Ejection fraction was 74% and she had normal wall motion.  She did not develop any ECG changes.  She was hospitalized September and in October 2017.  She had episodes of lightheadedness upon standing, nausea, lower abdominal pain and confusion.  He was found to have renal failure acutely and was treated with intravenous fluids.  He had increased to 1.74.  He has a history of atrial  fibrillation but was felt not to be a candidate for anticoagulation due to fall risk.  Her beta blocker had been discontinued due to bradycardia.  When she was admitted to [facility] from October 5 through 02/22/2016 she was treated with Cardizem.  She had an unremarkable CT of her head and neck.  She broke her  wrist before Christmas.  She has been followed by Dr. Gus Height, for primary care.   When I saw her in October 2018 her blood pressure was elevated. In the past, she apparently had some issues with diltiazem.  She was back on metoprolol.  With her continued blood pressure elevation.  I suggested the addition of amlodipine 5 mg.  Apparently there is no record that this ever was filled.  She is no longer fully aware of the medicines she is taking since her daughter provides her medicine for her.  She was on metoprolol tartrate 50 mg twice a day, irbesartan 150 mg daily for hypertension, low-dose atorvastatin 10 mg for hyperlipidemia.  She is on Aricept.  There is a history of depression for which she's on Cymbalta.  She denied recent wheezing on when necessary albuterol.  In addition to breo-ellipta.    I saw her in December 2019 at which time she denied any episodes of chest pain.  She was last evaluated in a telemedicine visit in June 2020.  She was on supplemental oxygen.  She denied significant leg swelling.  She continues to be on atorvastatin for hyperlipidemia.  His leg edema had resolved.  I recently saw her on June 22, 2019 and an in office it.  At that time she denied any major change in shortness of breath and was without chest pain.  Her resting pulse was elevated in the 90s and her blood pressure was increased.  I suggested slight titration of metoprolol from 50 mg twice a a day 75 mg in the morning and 50 mg at night.  Also since her blood pressure was elevated I suggested the addition of amlodipine 2.5 mg at bedtime.  She was scheduled to undergo an in office visit today but apparently had called the office stating that she was having GI issues and prefer this to be done virtually.  Since adjusting her medications at her left states that she is always tired.  She admits to being sleepy.  She is unaware palpitations.  She denies dizziness.  She did not have any apparatus at home to take  her blood pressure today.  The patient does not have symptoms concerning for COVID-19 infection (fever, chills, cough, or new shortness of breath).    Past Medical History:  Diagnosis Date  . Acid reflux   . Asthma    Asthmatic COPD  . Atrial fibrillation ([place]) 01/01/2016  . Coronary artery disease    Echo 07/27/2011   . Coronary artery stenosis    , High-grade left main  . Dementia ([place])   . Essential tremor 03/16/2018  . GERD (gastroesophageal reflux disease)   . Hyperlipidemia   . Hypertension   . IBS (irritable bowel syndrome)   . Psoriasis    Past Surgical History:  Procedure Laterality Date  . CARDIAC SURGERY    . CORONARY ARTERY BYPASS GRAFT  1994   After catheterization showed 90% ostial left main stenosis  . DOPPLER ECHOCARDIOGRAPHY     Study showed mild to moderate MR with moderate TR, mild to moderate pulmonary hypertension with an ejection fraction greater that 55%.   [name] EYE  SURGERY    . Nuclear Perfusion  10/2010, 11/15/2012   Showed normal perfusion, No Ischemia or Infarction, Normal EF  . Renal Scan Duplex  04/09/2012   Showing greater than 50% diameter reduction in the celiac artery and SMA.. Right proximal 275 and Mid 152.  [name] ROTATOR CUFF REPAIR     bilateral  . Sleep Study  12/28/2006   AHI during total sleep time (3h 19 minutes) was 1.81/hr and during REM sleep at 17.78/hr. Mild sleep apnea during REM sleep.Oxygen staturated rate during REM and NREM was 93.0%.     Current Meds  Medication Sig  . Acetaminophen (TYLENOL ARTHRITIS EXT RELIEF PO) Take by mouth.  [name] albuterol (VENTOLIN HFA) 108 (90 Base) MCG/ACT inhaler INHALE 2 PUFFS BY MOUTH EVERY 4 TO 6 HOURS AS NEEDED FOR COUGH OR  WHEEZING  . amLODipine (NORVASC) 2.5 MG tablet Take at bed time  . aspirin 81 MG tablet Take 81 mg by mouth daily.   [name] augmented betamethasone dipropionate (DIPROLENE-AF) 0.05 % ointment APPLY EXTERNALLY ONCE OR TWICE A DAY  . Budeson-Glycopyrrol-Formoterol (BREZTRI AEROSPHERE)  160-9-4.8 MCG/ACT AERO Inhale 2 puffs into the lungs 2 (two) times daily.  [name] donepezil (ARICEPT) 10 MG tablet Take 1 tablet by mouth once daily  . DULoxetine (CYMBALTA) 60 MG capsule Take 60 mg by mouth daily. Reported on 08/05/2015  . fluticasone (FLONASE) 50 MCG/ACT nasal spray USE 1 TO 2 SPRAY(S) IN EACH NOSTRIL ONCE DAILY FOR  STUFFY  NOSE  OR  DRAINAGE  . fluticasone furoate-vilanterol (BREO ELLIPTA) 200-25 MCG/INH AEPB Inhale 1 puff into the lungs daily.  . folic acid (FOLVITE) 1 MG tablet Take 1 tablet (1 mg total) by mouth daily.  [name] ipratropium (ATROVENT) 0.06 % nasal spray Place 1-2 sprays into both nostrils 3 (three) times daily. As needed.  . methylPREDNISolone acetate (DEPO-MEDROL) 40 MG/ML injection SMARTSIG:2 Milligram(s) IM Once PRN  . metoprolol tartrate (LOPRESSOR) 50 MG tablet Take 1 and 1/2 tablet in the morning (75mg ) and 1 tablet at night (50mg )  . omeprazole (PRILOSEC) 20 MG capsule Take 20 mg by mouth 2 (two) times daily before a meal.  . pantoprazole (PROTONIX) 40 MG tablet Take 1 tablet (40 mg total) by mouth 2 (two) times daily.  . pentoxifylline (TRENTAL) 400 MG CR tablet Take 400 mg by mouth 2 (two) times daily.  . Probiotic Product (PROBIOTIC DAILY) CAPS Take 1 capsule by mouth daily.  . Turmeric 500 MG CAPS Take 1,000 mg by mouth.     Allergies:   Penicillins, Naprosyn [naproxen], Sertraline, and Lactose intolerance (gi)   Social History   Tobacco Use  . Smoking status: Former Smoker    Packs/day: 1.00    Years: 18.00    Pack years: 18.00    Quit date: 11/08/1962    Years since quitting: 56.7  . Smokeless tobacco: Never Used  Substance Use Topics  . Alcohol use: No    Alcohol/week: 0.0 standard drinks    Comment: h/o heavy use - 2 bottles of wine daily  . Drug use: No     Family Hx: The patient's family history includes Cancer in her brother; Colon cancer in her brother; Heart disease in her mother; Hypertension in her sister.  ROS:   Please see the  history of present illness.    She denies any fevers chills or night sweats She was having some GI issues with loose stool She was unaware of palpitations She denied anginal symptoms She admitted to being sleepy  all the time She denied cough She denied leg swelling All other systems reviewed and are negative.   Prior CV studies:   The following studies were reviewed today:  ECHO 07/19/2019 IMPRESSIONS  1. Left ventricular ejection fraction, by estimation, is 65 to 70%. The  left ventricle has normal function. The left ventricle has no regional  wall motion abnormalities. There is moderate concentric left ventricular  hypertrophy. Left ventricular  diastolic parameters are consistent with Grade I diastolic dysfunction  (impaired relaxation). Elevated left atrial pressure.  2. Right ventricular systolic function is normal. The right ventricular  size is normal. There is mildly elevated pulmonary artery systolic  pressure. The estimated right ventricular systolic pressure is 0000000 mmHg.  3. Left atrial size was mildly dilated.  4. The mitral valve is normal in structure and function. Mild mitral  valve regurgitation. No evidence of mitral stenosis.  5. The aortic valve is normal in structure and function. Aortic valve  regurgitation is not visualized. Mild to moderate aortic valve  sclerosis/calcification is present, without any evidence of aortic  stenosis.  6. The inferior vena cava is normal in size with greater than 50%  respiratory variability, suggesting right atrial pressure of 3 mmHg.   Labs/Other Tests and Data Reviewed:    EKG:  An ECG dated 06/22/2019 was personally reviewed today and demonstrated:  NSR at 95; nonspecific ST changes;no ectopy, normal intervals  Recent Labs: No results found for requested labs within last 8760 hours.   Recent Lipid Panel Lab Results  Component Value Date/Time   CHOL 118 01/03/2016 03:04 AM   CHOL 128 11/09/2012 08:05 AM   TRIG  106 01/03/2016 03:04 AM   TRIG 84 11/09/2012 08:05 AM   HDL 43 01/03/2016 03:04 AM   HDL 50 11/09/2012 08:05 AM   CHOLHDL 2.7 01/03/2016 03:04 AM   LDLCALC 54 01/03/2016 03:04 AM   LDLCALC 61 11/09/2012 08:05 AM    Wt Readings from Last 3 Encounters:  06/22/19 143 lb 8 oz (65.1 kg)  03/20/19 148 lb 12.8 oz (67.5 kg)  04/17/18 150 lb 3.2 oz (68.1 kg)     Objective:    Vital Signs:  Ht 4\' 9"  (1.448 m)   BMI 31.05 kg/m    Patient did not have any capability to check her blood pressure today.  I will ask her to try taking a pulse but she could not give me the number but felt the pulse was regular. Due to her speech very difficult to understand her very clearly over the telephone. There did not appear to significant shortness of breath She did not have any discomfort to her chest wall to palpation She denied any leg swelling  ASSESSMENT & PLAN:    1. Essential hypertension   2. Coronary artery disease involving native coronary artery of native heart with angina pectoris ([place])   3. Pulmonary hypertension ([place])   4. Shortness of breath    Tammy [name] is an 86 year old female who underwent CABG revascularization surgery 1994.  She has had issues with significant fatigue and shortness of breath.  She has documented normal systolic function with Moderate concentric LVH and grade 1 diastolic dysfunction.  Her echo Doppler study suggested elevated left atrial pressure.  When I last saw her, her heart rate was increased and her blood pressure was high and I recommended slight additional titration of metoprolol and started her on low-dose amlodipine 2.5 mg at bedtime.  She has felt very sleepy since her medication adjustment.  I suspect this may be due to the increase beta-blocker therapy.  Unfortunately she was unable to take her pulse today so I do not know what her heart rate is and since she could not come to the office and I do not have any data regarding her most recent blood pressure.   However with her symptoms I have suggested she reduce her metoprolol back to her prior dose.  She will continue with the amlodipine for optimal blood pressure control.  She is not having anginal symptoms.  She has stable renal function.  She continues to be on Brio Ellipta and Atrovent for her lung disease.  She is on Aricept for dementia and.  I have asked that she come for a follow-up office visit once her GI status improves for blood pressure recording and heart rate assessment and additional medication changes may be necessary at that time.   COVID-19 Education: The signs and symptoms of COVID-19 were discussed with the patient and how to seek care for testing (follow up with PCP or arrange E-visit).  The importance of social distancing was discussed today.  Time:   Today, I have spent 18 minutes with the patient with telehealth technology discussing the above problems.     Medication Adjustments/Labs and Tests Ordered: Current medicines are reviewed at length with the patient today.  Concerns regarding medicines are outlined above.   Tests Ordered: No orders of the defined types were placed in this encounter.   Medication Changes: No orders of the defined types were placed in this encounter.   Follow Up:  In Person follow-up in 2 to 4 weeks  Signed, Shelva Majestic, MD  08/06/2019 3:38 PM    [place]

## 2019-08-14 NOTE — Progress Notes (Deleted)
Cardiology Office Note   Date:  08/14/2019   ID:  Pragya, Birkes 22-Jul-1930, MRN [id number]  PCP:  [name], MD  Cardiologist:  Shelva Majestic, MD EP: None  No chief complaint on file.     History of Present Illness: Tammy [name] is a 86 y.o. female with PMH of CAD s/p CABG in 1994, HTN, HLD, GERD, and dementia, who presents for ***  She was last evaluated by cardiology via a telemedicine visit with Dr. Claiborne Billings 08/06/19, at which time her primary complaint was increased fatigue and sleepiness after increasing her metoprolol and amlodipine for management of her HTN. Her metoprolol was reduced back to previously dosed 50mg  BID. She was recommended for an in office visit in 1 week to follow-up on her blood pressure/HR since she was unable to monitor these at home. Her last echocardiogram 07/19/19 showed EF 65-70%, G1DD, no RWMA, moderate concentric LVH, and mild MR. Her last ischemic evaluation was a NST in 2016 which was without ischemia.    1. HTN: BP *** today - Continue amlodipine, lasix, and metoprolol  2. CAD s/p CABG in 1994: no anginal complaints.  - Continue aspirin and   3. HLD: LDL reportedly 76 10/2018; goal <70 - Continue atorvastatin ***   Past Medical History:  Diagnosis Date  . Acid reflux   . Asthma    Asthmatic COPD  . Atrial fibrillation ([place]) 01/01/2016  . Coronary artery disease    Echo 07/27/2011   . Coronary artery stenosis    , High-grade left main  . Dementia ([place])   . Essential tremor 03/16/2018  . GERD (gastroesophageal reflux disease)   . Hyperlipidemia   . Hypertension   . IBS (irritable bowel syndrome)   . Psoriasis     Past Surgical History:  Procedure Laterality Date  . CARDIAC SURGERY    . CORONARY ARTERY BYPASS GRAFT  1994   After catheterization showed 90% ostial left main stenosis  . DOPPLER ECHOCARDIOGRAPHY     Study showed mild to moderate MR with moderate TR, mild to moderate pulmonary hypertension with an ejection  fraction greater that 55%.   [name] EYE SURGERY    . Nuclear Perfusion  10/2010, 11/15/2012   Showed normal perfusion, No Ischemia or Infarction, Normal EF  . Renal Scan Duplex  04/09/2012   Showing greater than 50% diameter reduction in the celiac artery and SMA.. Right proximal 275 and Mid 152.  [name] ROTATOR CUFF REPAIR     bilateral  . Sleep Study  12/28/2006   AHI during total sleep time (3h 19 minutes) was 1.81/hr and during REM sleep at 17.78/hr. Mild sleep apnea during REM sleep.Oxygen staturated rate during REM and NREM was 93.0%.     Current Outpatient Medications  Medication Sig Dispense Refill  . Acetaminophen (TYLENOL ARTHRITIS EXT RELIEF PO) Take by mouth.    [name] albuterol (VENTOLIN HFA) 108 (90 Base) MCG/ACT inhaler INHALE 2 PUFFS BY MOUTH EVERY 4 TO 6 HOURS AS NEEDED FOR COUGH OR  WHEEZING 18 g 3  . amLODipine (NORVASC) 2.5 MG tablet Take at bed time 30 tablet 1  . aspirin 81 MG tablet Take 81 mg by mouth daily.     [name] augmented betamethasone dipropionate (DIPROLENE-AF) 0.05 % ointment APPLY EXTERNALLY ONCE OR TWICE A DAY    . Budeson-Glycopyrrol-Formoterol (BREZTRI AEROSPHERE) 160-9-4.8 MCG/ACT AERO Inhale 2 puffs into the lungs 2 (two) times daily. 10.7 g 5  . donepezil (ARICEPT) 10 MG tablet  Take 1 tablet by mouth once daily 90 tablet 0  . DULoxetine (CYMBALTA) 60 MG capsule Take 60 mg by mouth daily. Reported on 08/05/2015  2  . fluticasone (FLONASE) 50 MCG/ACT nasal spray USE 1 TO 2 SPRAY(S) IN EACH NOSTRIL ONCE DAILY FOR  STUFFY  NOSE  OR  DRAINAGE 16 g 1  . fluticasone furoate-vilanterol (BREO ELLIPTA) 200-25 MCG/INH AEPB Inhale 1 puff into the lungs daily. 30 each 5  . folic acid (FOLVITE) 1 MG tablet Take 1 tablet (1 mg total) by mouth daily.    . furosemide (LASIX) 20 MG tablet Take 1 tablet (20 mg total) by mouth daily. 90 tablet 3  . ipratropium (ATROVENT) 0.06 % nasal spray Place 1-2 sprays into both nostrils 3 (three) times daily. As needed. 15 mL 5  . methylPREDNISolone  acetate (DEPO-MEDROL) 40 MG/ML injection SMARTSIG:2 Milligram(s) IM Once PRN    . metoprolol tartrate (LOPRESSOR) 50 MG tablet Take 1 tablet (50 mg total) by mouth 2 (two) times daily. Take 1 tablet twice a day 180 tablet 3  . omeprazole (PRILOSEC) 20 MG capsule Take 20 mg by mouth 2 (two) times daily before a meal.    . pantoprazole (PROTONIX) 40 MG tablet Take 1 tablet (40 mg total) by mouth 2 (two) times daily. 60 tablet 5  . pentoxifylline (TRENTAL) 400 MG CR tablet Take 400 mg by mouth 2 (two) times daily.    . Probiotic Product (PROBIOTIC DAILY) CAPS Take 1 capsule by mouth daily.    . Turmeric 500 MG CAPS Take 1,000 mg by mouth.     No current facility-administered medications for this visit.    Allergies:   Penicillins, Naprosyn [naproxen], Sertraline, and Lactose intolerance (gi)    Social History:  The patient  reports that she quit smoking about 56 years ago. She has a 18.00 pack-year smoking history. She has never used smokeless tobacco. She reports that she does not drink alcohol or use drugs.   Family History:  The patient's ***family history includes Cancer in her brother; Colon cancer in her brother; Heart disease in her mother; Hypertension in her sister.    ROS:  Please see the history of present illness.   Otherwise, review of systems are positive for {NONE DEFAULTED:18576::"none"}.   All other systems are reviewed and negative.    PHYSICAL EXAM: VS:  There were no vitals taken for this visit. , BMI There is no height or weight on file to calculate BMI. GEN: Well nourished, well developed, in no acute distress HEENT: normal Neck: no JVD, carotid bruits, or masses Cardiac: ***RRR; no murmurs, rubs, or gallops,no edema  Respiratory:  clear to auscultation bilaterally, normal work of breathing GI: soft, nontender, nondistended, + BS MS: no deformity or atrophy Skin: warm and dry, no rash Neuro:  Strength and sensation are intact Psych: euthymic mood, full  affect   EKG:  EKG {ACTION; IS/IS GI:[id number] ordered today. The ekg ordered today demonstrates ***   Recent Labs: No results found for requested labs within last 8760 hours.    Lipid Panel    Component Value Date/Time   CHOL 118 01/03/2016 0304   CHOL 128 11/09/2012 0805   TRIG 106 01/03/2016 0304   TRIG 84 11/09/2012 0805   HDL 43 01/03/2016 0304   HDL 50 11/09/2012 0805   CHOLHDL 2.7 01/03/2016 0304   VLDL 21 01/03/2016 0304   LDLCALC 54 01/03/2016 0304   LDLCALC 61 11/09/2012 0805      Wt  Readings from Last 3 Encounters:  06/22/19 143 lb 8 oz (65.1 kg)  03/20/19 148 lb 12.8 oz (67.5 kg)  04/17/18 150 lb 3.2 oz (68.1 kg)      Other studies Reviewed: Additional studies/ records that were reviewed today include:   ECHO 07/19/2019 IMPRESSIONS  1. Left ventricular ejection fraction, by estimation, is 65 to 70%. The  left ventricle has normal function. The left ventricle has no regional  wall motion abnormalities. There is moderate concentric left ventricular  hypertrophy. Left ventricular  diastolic parameters are consistent with Grade I diastolic dysfunction  (impaired relaxation). Elevated left atrial pressure.  2. Right ventricular systolic function is normal. The right ventricular  size is normal. There is mildly elevated pulmonary artery systolic  pressure. The estimated right ventricular systolic pressure is 0000000 mmHg.  3. Left atrial size was mildly dilated.  4. The mitral valve is normal in structure and function. Mild mitral  valve regurgitation. No evidence of mitral stenosis.  5. The aortic valve is normal in structure and function. Aortic valve  regurgitation is not visualized. Mild to moderate aortic valve  sclerosis/calcification is present, without any evidence of aortic  stenosis.  6. The inferior vena cava is normal in size with greater than 50%  respiratory variability, suggesting right atrial pressure of 3 mmHg.   NST 2016:  There  was no ST segment deviation noted during stress.  The study is normal.  The left ventricular ejection fraction is hyperdynamic (>65%).  Nuclear stress EF: 74%.   Normal stress nuclear study with no ischemia or infarction; EF 74 and normal wall motion.   ASSESSMENT AND PLAN:  1.  ***   Current medicines are reviewed at length with the patient today.  The patient {ACTIONS; HAS/DOES NOT HAVE:19233} concerns regarding medicines.  The following changes have been made:  {PLAN; NO CHANGE:13088:s}  Labs/ tests ordered today include: *** No orders of the defined types were placed in this encounter.    Disposition:   FU with *** in {gen number [id number] {Days to years:10300}  Signed, Abigail Butts, PA-C  08/14/2019 11:10 PM

## 2019-08-15 ENCOUNTER — Ambulatory Visit: Payer: PPO | Admitting: Medical

## 2019-08-21 ENCOUNTER — Encounter: Payer: Self-pay | Admitting: Cardiovascular Disease

## 2019-09-03 ENCOUNTER — Other Ambulatory Visit: Payer: Self-pay | Admitting: Physician Assistant

## 2019-09-03 DIAGNOSIS — I272 Pulmonary hypertension, unspecified: Secondary | ICD-10-CM

## 2019-09-03 DIAGNOSIS — I10 Essential (primary) hypertension: Secondary | ICD-10-CM

## 2019-09-03 DIAGNOSIS — R0602 Shortness of breath: Secondary | ICD-10-CM

## 2019-09-03 DIAGNOSIS — I25119 Atherosclerotic heart disease of native coronary artery with unspecified angina pectoris: Secondary | ICD-10-CM

## 2019-09-22 ENCOUNTER — Other Ambulatory Visit: Payer: Self-pay | Admitting: Cardiovascular Disease

## 2019-10-16 DIAGNOSIS — H26493 Other secondary cataract, bilateral: Secondary | ICD-10-CM | POA: Diagnosis not present

## 2019-10-16 DIAGNOSIS — H43812 Vitreous degeneration, left eye: Secondary | ICD-10-CM | POA: Diagnosis not present

## 2019-10-16 DIAGNOSIS — H04123 Dry eye syndrome of bilateral lacrimal glands: Secondary | ICD-10-CM | POA: Diagnosis not present

## 2019-10-16 DIAGNOSIS — Z961 Presence of intraocular lens: Secondary | ICD-10-CM | POA: Diagnosis not present

## 2019-11-01 DIAGNOSIS — E782 Mixed hyperlipidemia: Secondary | ICD-10-CM | POA: Diagnosis not present

## 2019-11-01 DIAGNOSIS — J4540 Moderate persistent asthma, uncomplicated: Secondary | ICD-10-CM | POA: Diagnosis not present

## 2019-11-01 DIAGNOSIS — Z0000 Encounter for general adult medical examination without abnormal findings: Secondary | ICD-10-CM | POA: Diagnosis not present

## 2019-11-01 DIAGNOSIS — I700 Atherosclerosis of aorta: Secondary | ICD-10-CM | POA: Diagnosis not present

## 2019-11-01 DIAGNOSIS — F324 Major depressive disorder, single episode, in partial remission: Secondary | ICD-10-CM | POA: Diagnosis not present

## 2019-11-01 DIAGNOSIS — E46 Unspecified protein-calorie malnutrition: Secondary | ICD-10-CM | POA: Diagnosis not present

## 2019-11-01 DIAGNOSIS — E559 Vitamin D deficiency, unspecified: Secondary | ICD-10-CM | POA: Diagnosis not present

## 2019-11-01 DIAGNOSIS — R2681 Unsteadiness on feet: Secondary | ICD-10-CM | POA: Diagnosis not present

## 2019-11-01 DIAGNOSIS — N183 Chronic kidney disease, stage 3 unspecified: Secondary | ICD-10-CM | POA: Diagnosis not present

## 2019-11-01 DIAGNOSIS — I10 Essential (primary) hypertension: Secondary | ICD-10-CM | POA: Diagnosis not present

## 2019-11-01 DIAGNOSIS — L400 Psoriasis vulgaris: Secondary | ICD-10-CM | POA: Diagnosis not present

## 2019-11-01 DIAGNOSIS — F0390 Unspecified dementia without behavioral disturbance: Secondary | ICD-10-CM | POA: Diagnosis not present

## 2019-11-02 ENCOUNTER — Other Ambulatory Visit: Payer: Self-pay | Admitting: *Deleted

## 2019-11-02 NOTE — Patient Outreach (Signed)
[facility]) Care Management  11/02/2019  Tammy [name] 03/17/1931 [id number]   Telephone Assessment-Decline [facility]   RN spoke with daughter concerning issues with pt regarding unsafe with ongoing driving. Daughter states she is limited with trying to assist pt with any kind of assistance due to her refusal of services. RN further discussed level of case for possible future placement based upon her limitations in managing her care independently. States pt would decline such options at this time. Much discussion on any needs such as pharmacy (none at this time), safety in the home-although pt has had falls in the past even with the DME available will not accept offered services to assist with PT for the duration of the services. Pt lives along however daughter able to check on pt regularly. Pt continues to drive as daughter explains this is unsafe and is not in agreeance with this choice (pt recent renewed her license). Daughter is aware if pt deemed competent there is limitation on restricting her however APS can be alerted but this may not be an immediate solution to her concerns. States her provider is aware of all her concerns. RN offered to assist as this RN has had the pt on several enrollments with [facility] over the years. Daughter opt to decline enrollment this time indicating pt would not be compliant with making to many changes or adherent with working on prevention measures and remains is very independent with her decisions.   RN able to provider available resources and further assistance when needed with pharmacy/social worker or this RN case management when pt has additional needs for available services at that time. Daughter very grateful for the call and information provided today.   Based upon the above conversation will close case and notify provider of the decline for [facility] services at this time.   [name], RN Care Management Coordinator [place]  Office [phone]

## 2019-11-06 ENCOUNTER — Emergency Department (HOSPITAL_COMMUNITY)
Admission: EM | Admit: 2019-11-06 | Discharge: 2019-11-07 | Disposition: A | Discharge status: Home / Self Care | Payer: PPO | Attending: Emergency Medicine | Admitting: Emergency Medicine

## 2019-11-06 ENCOUNTER — Encounter (HOSPITAL_COMMUNITY): Payer: Self-pay

## 2019-11-06 DIAGNOSIS — R6889 Other general symptoms and signs: Secondary | ICD-10-CM | POA: Insufficient documentation

## 2019-11-06 DIAGNOSIS — R197 Diarrhea, unspecified: Secondary | ICD-10-CM | POA: Diagnosis not present

## 2019-11-06 DIAGNOSIS — Z5321 Procedure and treatment not carried out due to patient leaving prior to being seen by health care provider: Secondary | ICD-10-CM | POA: Diagnosis not present

## 2019-11-06 DIAGNOSIS — R109 Unspecified abdominal pain: Secondary | ICD-10-CM | POA: Diagnosis not present

## 2019-11-06 LAB — CBC
HCT: 38.1 % (ref 36.0–46.0)
Hemoglobin: 11.6 g/dL — ABNORMAL LOW (ref 12.0–15.0)
MCH: 27.8 pg (ref 26.0–34.0)
MCHC: 30.4 g/dL (ref 30.0–36.0)
MCV: 91.1 fL (ref 80.0–100.0)
Platelets: 298 10*3/uL (ref 150–400)
RBC: 4.18 MIL/uL (ref 3.87–5.11)
RDW: 13.2 % (ref 11.5–15.5)
WBC: 14.8 10*3/uL — ABNORMAL HIGH (ref 4.0–10.5)
nRBC: 0 % (ref 0.0–0.2)

## 2019-11-06 LAB — COMPREHENSIVE METABOLIC PANEL
ALT: 289 U/L — ABNORMAL HIGH (ref 0–44)
AST: 655 U/L — ABNORMAL HIGH (ref 15–41)
Albumin: 3.3 g/dL — ABNORMAL LOW (ref 3.5–5.0)
Alkaline Phosphatase: 134 U/L — ABNORMAL HIGH (ref 38–126)
Anion gap: 12 (ref 5–15)
BUN: 27 mg/dL — ABNORMAL HIGH (ref 8–23)
CO2: 20 mmol/L — ABNORMAL LOW (ref 22–32)
Calcium: 8.9 mg/dL (ref 8.9–10.3)
Chloride: 104 mmol/L (ref 98–111)
Creatinine, Ser: 1.49 mg/dL — ABNORMAL HIGH (ref 0.44–1.00)
GFR calc Af Amer: 36 mL/min — ABNORMAL LOW (ref >60)
GFR calc non Af Amer: 31 mL/min — ABNORMAL LOW (ref >60)
Glucose, Bld: 184 mg/dL — ABNORMAL HIGH (ref 70–99)
Potassium: 4.6 mmol/L (ref 3.5–5.1)
Sodium: 136 mmol/L (ref 135–145)
Total Bilirubin: 1.7 mg/dL — ABNORMAL HIGH (ref 0.3–1.2)
Total Protein: 6.5 g/dL (ref 6.5–8.1)

## 2019-11-06 LAB — URINALYSIS, ROUTINE W REFLEX MICROSCOPIC
Bilirubin Urine: NEGATIVE
Glucose, UA: NEGATIVE mg/dL
Hgb urine dipstick: NEGATIVE
Ketones, ur: NEGATIVE mg/dL
Nitrite: NEGATIVE
Protein, ur: 30 mg/dL — AB
Specific Gravity, Urine: 1.023 (ref 1.005–1.030)
pH: 5 (ref 5.0–8.0)

## 2019-11-06 LAB — LIPASE, BLOOD: Lipase: 35 U/L (ref 11–51)

## 2019-11-06 MED ORDER — SODIUM CHLORIDE 0.9% FLUSH: 3.0000 mL | Freq: Once | INTRAVENOUS | Status: DC

## 2019-11-06 NOTE — ED Triage Notes (Signed)
Pt reports that she has been feeling bad for the past few days with diarrhea and abd pain.

## 2019-11-07 NOTE — ED Notes (Signed)
Pt's family advise this writer that pt cannot sit up any longer and would like to leave. This Probation officer encouraged pt to stay, but was unsuccessful.

## 2019-11-12 DIAGNOSIS — M25511 Pain in right shoulder: Secondary | ICD-10-CM | POA: Diagnosis not present

## 2019-11-12 DIAGNOSIS — M25512 Pain in left shoulder: Secondary | ICD-10-CM | POA: Diagnosis not present

## 2019-11-12 DIAGNOSIS — M545 Low back pain: Secondary | ICD-10-CM | POA: Diagnosis not present

## 2019-11-12 DIAGNOSIS — M25552 Pain in left hip: Secondary | ICD-10-CM | POA: Diagnosis not present

## 2019-11-12 DIAGNOSIS — M542 Cervicalgia: Secondary | ICD-10-CM | POA: Diagnosis not present

## 2019-11-12 DIAGNOSIS — I872 Venous insufficiency (chronic) (peripheral): Secondary | ICD-10-CM | POA: Diagnosis not present

## 2019-11-12 DIAGNOSIS — M47812 Spondylosis without myelopathy or radiculopathy, cervical region: Secondary | ICD-10-CM | POA: Diagnosis not present

## 2019-11-12 DIAGNOSIS — F0390 Unspecified dementia without behavioral disturbance: Secondary | ICD-10-CM | POA: Diagnosis not present

## 2019-11-12 DIAGNOSIS — R109 Unspecified abdominal pain: Secondary | ICD-10-CM | POA: Diagnosis not present

## 2019-11-13 ENCOUNTER — Other Ambulatory Visit: Payer: Self-pay | Admitting: Family Medicine

## 2019-11-13 DIAGNOSIS — R109 Unspecified abdominal pain: Secondary | ICD-10-CM

## 2019-11-21 DIAGNOSIS — M25552 Pain in left hip: Secondary | ICD-10-CM | POA: Diagnosis not present

## 2019-11-23 DIAGNOSIS — F0390 Unspecified dementia without behavioral disturbance: Secondary | ICD-10-CM | POA: Diagnosis not present

## 2019-11-23 DIAGNOSIS — F324 Major depressive disorder, single episode, in partial remission: Secondary | ICD-10-CM | POA: Diagnosis not present

## 2019-11-23 DIAGNOSIS — J4540 Moderate persistent asthma, uncomplicated: Secondary | ICD-10-CM | POA: Diagnosis not present

## 2019-11-23 DIAGNOSIS — E782 Mixed hyperlipidemia: Secondary | ICD-10-CM | POA: Diagnosis not present

## 2019-11-23 DIAGNOSIS — M810 Age-related osteoporosis without current pathological fracture: Secondary | ICD-10-CM | POA: Diagnosis not present

## 2019-11-23 DIAGNOSIS — J439 Emphysema, unspecified: Secondary | ICD-10-CM | POA: Diagnosis not present

## 2019-11-23 DIAGNOSIS — I10 Essential (primary) hypertension: Secondary | ICD-10-CM | POA: Diagnosis not present

## 2019-11-23 DIAGNOSIS — N183 Chronic kidney disease, stage 3 unspecified: Secondary | ICD-10-CM | POA: Diagnosis not present

## 2019-11-26 ENCOUNTER — Ambulatory Visit
Admission: RE | Admit: 2019-11-26 | Discharge: 2019-11-26 | Disposition: A | Discharge status: Home / Self Care | Payer: PPO | Source: Ambulatory Visit | Attending: Family Medicine | Admitting: Family Medicine

## 2019-11-26 DIAGNOSIS — K838 Other specified diseases of biliary tract: Secondary | ICD-10-CM | POA: Diagnosis not present

## 2019-11-26 DIAGNOSIS — K7689 Other specified diseases of liver: Secondary | ICD-10-CM | POA: Diagnosis not present

## 2019-11-26 DIAGNOSIS — R109 Unspecified abdominal pain: Secondary | ICD-10-CM

## 2019-12-11 ENCOUNTER — Encounter (HOSPITAL_COMMUNITY): Payer: Self-pay | Admitting: Emergency Medicine

## 2019-12-11 ENCOUNTER — Other Ambulatory Visit: Payer: Self-pay

## 2019-12-11 ENCOUNTER — Inpatient Hospital Stay (HOSPITAL_COMMUNITY)
Admission: EM | Admit: 2019-12-11 | Discharge: 2019-12-17 | DRG: 640 | Disposition: A | Discharge status: Skilled Nursing Facility | Payer: PPO | Attending: Internal Medicine | Admitting: Internal Medicine

## 2019-12-11 ENCOUNTER — Emergency Department (HOSPITAL_COMMUNITY): Payer: PPO

## 2019-12-11 DIAGNOSIS — E739 Lactose intolerance, unspecified: Secondary | ICD-10-CM | POA: Diagnosis present

## 2019-12-11 DIAGNOSIS — Z79899 Other long term (current) drug therapy: Secondary | ICD-10-CM

## 2019-12-11 DIAGNOSIS — Z20822 Contact with and (suspected) exposure to covid-19: Secondary | ICD-10-CM | POA: Diagnosis present

## 2019-12-11 DIAGNOSIS — I272 Pulmonary hypertension, unspecified: Secondary | ICD-10-CM | POA: Diagnosis present

## 2019-12-11 DIAGNOSIS — S8011XA Contusion of right lower leg, initial encounter: Secondary | ICD-10-CM | POA: Diagnosis not present

## 2019-12-11 DIAGNOSIS — I110 Hypertensive heart disease with heart failure: Secondary | ICD-10-CM | POA: Diagnosis not present

## 2019-12-11 DIAGNOSIS — K219 Gastro-esophageal reflux disease without esophagitis: Secondary | ICD-10-CM | POA: Diagnosis present

## 2019-12-11 DIAGNOSIS — J449 Chronic obstructive pulmonary disease, unspecified: Secondary | ICD-10-CM | POA: Diagnosis present

## 2019-12-11 DIAGNOSIS — I25119 Atherosclerotic heart disease of native coronary artery with unspecified angina pectoris: Secondary | ICD-10-CM | POA: Diagnosis present

## 2019-12-11 DIAGNOSIS — D72829 Elevated white blood cell count, unspecified: Secondary | ICD-10-CM | POA: Diagnosis not present

## 2019-12-11 DIAGNOSIS — Z9181 History of falling: Secondary | ICD-10-CM | POA: Diagnosis not present

## 2019-12-11 DIAGNOSIS — D649 Anemia, unspecified: Secondary | ICD-10-CM | POA: Diagnosis present

## 2019-12-11 DIAGNOSIS — R55 Syncope and collapse: Secondary | ICD-10-CM | POA: Diagnosis present

## 2019-12-11 DIAGNOSIS — Z6831 Body mass index (BMI) 31.0-31.9, adult: Secondary | ICD-10-CM

## 2019-12-11 DIAGNOSIS — R778 Other specified abnormalities of plasma proteins: Secondary | ICD-10-CM | POA: Diagnosis present

## 2019-12-11 DIAGNOSIS — I480 Paroxysmal atrial fibrillation: Secondary | ICD-10-CM | POA: Diagnosis not present

## 2019-12-11 DIAGNOSIS — I10 Essential (primary) hypertension: Secondary | ICD-10-CM | POA: Diagnosis not present

## 2019-12-11 DIAGNOSIS — M160 Bilateral primary osteoarthritis of hip: Secondary | ICD-10-CM | POA: Diagnosis not present

## 2019-12-11 DIAGNOSIS — E785 Hyperlipidemia, unspecified: Secondary | ICD-10-CM | POA: Diagnosis not present

## 2019-12-11 DIAGNOSIS — Z800 Family history of malignant neoplasm of digestive organs: Secondary | ICD-10-CM

## 2019-12-11 DIAGNOSIS — M6281 Muscle weakness (generalized): Secondary | ICD-10-CM | POA: Diagnosis not present

## 2019-12-11 DIAGNOSIS — G9341 Metabolic encephalopathy: Secondary | ICD-10-CM | POA: Diagnosis not present

## 2019-12-11 DIAGNOSIS — R41841 Cognitive communication deficit: Secondary | ICD-10-CM | POA: Diagnosis not present

## 2019-12-11 DIAGNOSIS — Z7901 Long term (current) use of anticoagulants: Secondary | ICD-10-CM | POA: Diagnosis not present

## 2019-12-11 DIAGNOSIS — G250 Essential tremor: Secondary | ICD-10-CM | POA: Diagnosis not present

## 2019-12-11 DIAGNOSIS — Z7982 Long term (current) use of aspirin: Secondary | ICD-10-CM | POA: Diagnosis not present

## 2019-12-11 DIAGNOSIS — S8002XA Contusion of left knee, initial encounter: Secondary | ICD-10-CM | POA: Diagnosis not present

## 2019-12-11 DIAGNOSIS — Z951 Presence of aortocoronary bypass graft: Secondary | ICD-10-CM

## 2019-12-11 DIAGNOSIS — F0390 Unspecified dementia without behavioral disturbance: Secondary | ICD-10-CM | POA: Diagnosis present

## 2019-12-11 DIAGNOSIS — R2681 Unsteadiness on feet: Secondary | ICD-10-CM | POA: Diagnosis not present

## 2019-12-11 DIAGNOSIS — I2510 Atherosclerotic heart disease of native coronary artery without angina pectoris: Secondary | ICD-10-CM | POA: Diagnosis present

## 2019-12-11 DIAGNOSIS — E440 Moderate protein-calorie malnutrition: Secondary | ICD-10-CM | POA: Insufficient documentation

## 2019-12-11 DIAGNOSIS — I701 Atherosclerosis of renal artery: Secondary | ICD-10-CM | POA: Diagnosis not present

## 2019-12-11 DIAGNOSIS — R000 Tachycardia, unspecified: Secondary | ICD-10-CM | POA: Diagnosis present

## 2019-12-11 DIAGNOSIS — Z888 Allergy status to other drugs, medicaments and biological substances status: Secondary | ICD-10-CM

## 2019-12-11 DIAGNOSIS — M533 Sacrococcygeal disorders, not elsewhere classified: Secondary | ICD-10-CM | POA: Diagnosis not present

## 2019-12-11 DIAGNOSIS — R0602 Shortness of breath: Secondary | ICD-10-CM

## 2019-12-11 DIAGNOSIS — G9340 Encephalopathy, unspecified: Secondary | ICD-10-CM | POA: Diagnosis not present

## 2019-12-11 DIAGNOSIS — I69828 Other speech and language deficits following other cerebrovascular disease: Secondary | ICD-10-CM | POA: Diagnosis not present

## 2019-12-11 DIAGNOSIS — R0902 Hypoxemia: Secondary | ICD-10-CM | POA: Diagnosis not present

## 2019-12-11 DIAGNOSIS — Z87891 Personal history of nicotine dependence: Secondary | ICD-10-CM

## 2019-12-11 DIAGNOSIS — M1712 Unilateral primary osteoarthritis, left knee: Secondary | ICD-10-CM | POA: Diagnosis not present

## 2019-12-11 DIAGNOSIS — R197 Diarrhea, unspecified: Secondary | ICD-10-CM | POA: Diagnosis not present

## 2019-12-11 DIAGNOSIS — S3993XA Unspecified injury of pelvis, initial encounter: Secondary | ICD-10-CM | POA: Diagnosis not present

## 2019-12-11 DIAGNOSIS — Z8249 Family history of ischemic heart disease and other diseases of the circulatory system: Secondary | ICD-10-CM

## 2019-12-11 DIAGNOSIS — E860 Dehydration: Secondary | ICD-10-CM | POA: Diagnosis not present

## 2019-12-11 DIAGNOSIS — I4891 Unspecified atrial fibrillation: Secondary | ICD-10-CM | POA: Diagnosis not present

## 2019-12-11 DIAGNOSIS — S299XXA Unspecified injury of thorax, initial encounter: Secondary | ICD-10-CM | POA: Diagnosis not present

## 2019-12-11 DIAGNOSIS — R410 Disorientation, unspecified: Secondary | ICD-10-CM | POA: Diagnosis not present

## 2019-12-11 DIAGNOSIS — W19XXXA Unspecified fall, initial encounter: Secondary | ICD-10-CM | POA: Diagnosis not present

## 2019-12-11 DIAGNOSIS — E669 Obesity, unspecified: Secondary | ICD-10-CM | POA: Diagnosis not present

## 2019-12-11 DIAGNOSIS — R1312 Dysphagia, oropharyngeal phase: Secondary | ICD-10-CM | POA: Diagnosis not present

## 2019-12-11 DIAGNOSIS — S80811A Abrasion, right lower leg, initial encounter: Secondary | ICD-10-CM | POA: Diagnosis present

## 2019-12-11 DIAGNOSIS — K760 Fatty (change of) liver, not elsewhere classified: Secondary | ICD-10-CM | POA: Diagnosis not present

## 2019-12-11 DIAGNOSIS — I5032 Chronic diastolic (congestive) heart failure: Secondary | ICD-10-CM | POA: Diagnosis not present

## 2019-12-11 DIAGNOSIS — I700 Atherosclerosis of aorta: Secondary | ICD-10-CM | POA: Diagnosis not present

## 2019-12-11 DIAGNOSIS — Y92009 Unspecified place in unspecified non-institutional (private) residence as the place of occurrence of the external cause: Secondary | ICD-10-CM

## 2019-12-11 DIAGNOSIS — Z880 Allergy status to penicillin: Secondary | ICD-10-CM

## 2019-12-11 DIAGNOSIS — I739 Peripheral vascular disease, unspecified: Secondary | ICD-10-CM | POA: Diagnosis present

## 2019-12-11 DIAGNOSIS — R52 Pain, unspecified: Secondary | ICD-10-CM | POA: Diagnosis not present

## 2019-12-11 DIAGNOSIS — S80212A Abrasion, left knee, initial encounter: Secondary | ICD-10-CM | POA: Diagnosis present

## 2019-12-11 DIAGNOSIS — R4182 Altered mental status, unspecified: Secondary | ICD-10-CM | POA: Diagnosis not present

## 2019-12-11 DIAGNOSIS — G4730 Sleep apnea, unspecified: Secondary | ICD-10-CM | POA: Diagnosis present

## 2019-12-11 DIAGNOSIS — R7989 Other specified abnormal findings of blood chemistry: Secondary | ICD-10-CM | POA: Diagnosis present

## 2019-12-11 LAB — COMPREHENSIVE METABOLIC PANEL
ALT: 34 U/L (ref 0–44)
AST: 35 U/L (ref 15–41)
Albumin: 3.4 g/dL — ABNORMAL LOW (ref 3.5–5.0)
Alkaline Phosphatase: 67 U/L (ref 38–126)
Anion gap: 13 (ref 5–15)
BUN: 26 mg/dL — ABNORMAL HIGH (ref 8–23)
CO2: 20 mmol/L — ABNORMAL LOW (ref 22–32)
Calcium: 8.3 mg/dL — ABNORMAL LOW (ref 8.9–10.3)
Chloride: 109 mmol/L (ref 98–111)
Creatinine, Ser: 0.82 mg/dL (ref 0.44–1.00)
GFR calc Af Amer: >60 mL/min (ref >60)
GFR calc non Af Amer: >60 mL/min (ref >60)
Glucose, Bld: 109 mg/dL — ABNORMAL HIGH (ref 70–99)
Potassium: 4.6 mmol/L (ref 3.5–5.1)
Sodium: 142 mmol/L (ref 135–145)
Total Bilirubin: 1 mg/dL (ref 0.3–1.2)
Total Protein: 6.5 g/dL (ref 6.5–8.1)

## 2019-12-11 LAB — CK: Total CK: 310 U/L — ABNORMAL HIGH (ref 38–234)

## 2019-12-11 LAB — URINALYSIS, ROUTINE W REFLEX MICROSCOPIC
Glucose, UA: NEGATIVE mg/dL
Hgb urine dipstick: NEGATIVE
Ketones, ur: 40 mg/dL — AB
Nitrite: NEGATIVE
Protein, ur: NEGATIVE mg/dL
Specific Gravity, Urine: 1.025 (ref 1.005–1.030)
pH: 5.5 (ref 5.0–8.0)

## 2019-12-11 LAB — CBC WITH DIFFERENTIAL/PLATELET
Abs Immature Granulocytes: 0.06 10*3/uL (ref 0.00–0.07)
Basophils Absolute: 0 10*3/uL (ref 0.0–0.1)
Basophils Relative: 0 %
Eosinophils Absolute: 0.1 10*3/uL (ref 0.0–0.5)
Eosinophils Relative: 1 %
HCT: 36.7 % (ref 36.0–46.0)
Hemoglobin: 11.4 g/dL — ABNORMAL LOW (ref 12.0–15.0)
Immature Granulocytes: 1 %
Lymphocytes Relative: 12 %
Lymphs Abs: 1.4 10*3/uL (ref 0.7–4.0)
MCH: 28.4 pg (ref 26.0–34.0)
MCHC: 31.1 g/dL (ref 30.0–36.0)
MCV: 91.3 fL (ref 80.0–100.0)
Monocytes Absolute: 1.3 10*3/uL — ABNORMAL HIGH (ref 0.1–1.0)
Monocytes Relative: 10 %
Neutro Abs: 9.5 10*3/uL — ABNORMAL HIGH (ref 1.7–7.7)
Neutrophils Relative %: 76 %
Platelets: 176 10*3/uL (ref 150–400)
RBC: 4.02 MIL/uL (ref 3.87–5.11)
RDW: 14.6 % (ref 11.5–15.5)
WBC: 12.4 10*3/uL — ABNORMAL HIGH (ref 4.0–10.5)
nRBC: 0 % (ref 0.0–0.2)

## 2019-12-11 LAB — BLOOD GAS, VENOUS
Acid-base deficit: 5.4 mmol/L — ABNORMAL HIGH (ref 0.0–2.0)
Bicarbonate: 19.8 mmol/L — ABNORMAL LOW (ref 20.0–28.0)
O2 Saturation: 50.2 %
Patient temperature: 98.6
pCO2, Ven: 39.5 mmHg — ABNORMAL LOW (ref 44.0–60.0)
pH, Ven: 7.32 (ref 7.250–7.430)
pO2, Ven: 33.9 mmHg (ref 32.0–45.0)

## 2019-12-11 LAB — MAGNESIUM: Magnesium: 2.3 mg/dL (ref 1.7–2.4)

## 2019-12-11 LAB — BRAIN NATRIURETIC PEPTIDE: B Natriuretic Peptide: 105.3 pg/mL — ABNORMAL HIGH (ref 0.0–100.0)

## 2019-12-11 LAB — URINALYSIS, MICROSCOPIC (REFLEX)
Bacteria, UA: NONE SEEN
RBC / HPF: NONE SEEN RBC/hpf (ref 0–5)

## 2019-12-11 LAB — SARS CORONAVIRUS 2 BY RT PCR (HOSPITAL ORDER, PERFORMED IN ~~LOC~~ HOSPITAL LAB): SARS Coronavirus 2: NEGATIVE

## 2019-12-11 LAB — ETHANOL: Alcohol, Ethyl (B): <10 mg/dL (ref <10)

## 2019-12-11 LAB — D-DIMER, QUANTITATIVE: D-Dimer, Quant: 0.74 ug/mL-FEU — ABNORMAL HIGH (ref 0.00–0.50)

## 2019-12-11 LAB — TROPONIN I (HIGH SENSITIVITY): Troponin I (High Sensitivity): 41 ng/L — ABNORMAL HIGH (ref <18)

## 2019-12-11 LAB — PHOSPHORUS: Phosphorus: 3.3 mg/dL (ref 2.5–4.6)

## 2019-12-11 LAB — LACTIC ACID, PLASMA: Lactic Acid, Venous: 0.8 mmol/L (ref 0.5–1.9)

## 2019-12-11 LAB — AMMONIA: Ammonia: 10 umol/L (ref 9–35)

## 2019-12-11 MED ORDER — SODIUM CHLORIDE 0.9 % IV BOLUS
1000.0000 mL | Freq: Once | INTRAVENOUS | Status: AC
  Administered 2019-12-11: 1000 mL via INTRAVENOUS

## 2019-12-11 NOTE — H&P (Signed)
Tammy [name]:[id number] DOB: 1931-02-02 DOA: 12/11/2019    PCP: [name], MD   Outpatient Specialists:  CARDS:   Dr. Claiborne Billings   Patient arrived to ER on 12/11/19 at 1844 Referred by Attending Drenda Freeze, MD   Patient coming from: home Lives alone,      Chief Complaint:   Chief Complaint  Patient presents with  . Fall    HPI: Tammy [name] is a 86 y.o. female with medical history significant of dementia, hypertension, CAD, pulmonary hypertension, COPD occasionally on oxygen, atrial fibrillation on anticoagulation, Essential tremor,  Presented with patient was found by daughter on the floor of her home she lives at home alone and she dementia it is unclear for how long she has been on the floor possibly 2 days. Family became concerned and checked on her when she was not answering her phone they found her on the floor covered in feces she was unable to get up she will abrasions on bilateral legs.  Patient unsure how she fell.   Infectious risk factors:  Reports none   Has been vaccinated against COVID    Initial COVID TEST  NEGATIVE   Lab Results  Component Value Date   [place] NEGATIVE 12/11/2019    Regarding pertinent Chronic problems:    Hyperlipidemia -  on statins Lipitor  lipid Panel     Component Value Date/Time   CHOL 118 01/03/2016 0304   CHOL 128 11/09/2012 0805   TRIG 106 01/03/2016 0304   TRIG 84 11/09/2012 0805   HDL 43 01/03/2016 0304   HDL 50 11/09/2012 0805   CHOLHDL 2.7 01/03/2016 0304   VLDL 21 01/03/2016 0304   LDLCALC 54 01/03/2016 0304   LDLCALC 61 11/09/2012 0805     HTN on metoprolol and Norvasc   chronic CHF diastolic  - last echo diastolic dysfunction but preserved EF done in March 2021 On Lasix  CAD  - On Aspirin, statin, betablocker, Plavix                 -  followed by cardiology                - last cardiac cath  Status post CABG in 1994   Peripheral vascular disease right renal artery stenosis  greater than 70 cm diameter diameter reduction iliac artery and SMA    On [place]  COPD -sometimes on baseline oxygen        A. Fib -developed while hospitalized in 2017 - CHA2DS2 vas score   5   Not on anticoagulation secondary to Risk of Falls,          -  Rate control:  Currently controlled with Metoprolol,        Dementia - on Aricept     Chronic anemia - baseline hg Hemoglobin & Hematocrit  Recent Labs    11/06/19 2113 12/11/19 1929  HGB 11.6* 11.4*    While in ER: Noted to be initially tachycardic up to 106  trop mildly up to 40 Hospitalist was called for admission for acute encephalopathy   The following Work up has been ordered so far:  Orders Placed This Encounter  Procedures  . SARS Coronavirus 2 by RT PCR (hospital order, performed in [facility] hospital lab) Nasopharyngeal Nasopharyngeal Swab  . CT Head Wo Contrast  . CT Cervical Spine Wo Contrast  . DG Chest 1 View  . DG Pelvis 1-2 Views  . DG Knee Complete  4 Views Left  . DG Tibia/Fibula Right  . CBC with Differential/Platelet  . Comprehensive metabolic panel  . CK  . Urinalysis, Routine w reflex microscopic  . Catherize if unable to void  . Consult to hospitalist  ALL PATIENTS BEING ADMITTED/HAVING PROCEDURES NEED COVID-19 SCREENING  . EKG 12-Lead   Following Medications were ordered in ER: Medications  sodium chloride 0.9 % bolus 1,000 mL (1,000 mLs Intravenous New Bag/Given 12/11/19 1952)        Consult Orders  (From admission, onward)         Start     Ordered   12/11/19 2204  Consult to hospitalist  ALL PATIENTS BEING ADMITTED/HAVING PROCEDURES NEED COVID-19 SCREENING  Once       Comments: ALL PATIENTS BEING ADMITTED/HAVING PROCEDURES NEED COVID-19 SCREENING  Provider:  (Not yet assigned)  Question Answer Comment  Place call to: [facility]   Reason for Consult Admit      12/11/19 2203           Significant initial  Findings: Abnormal Labs Reviewed  CBC WITH  DIFFERENTIAL/PLATELET - Abnormal; Notable for the following components:      Result Value   WBC 12.4 (*)    Hemoglobin 11.4 (*)    Neutro Abs 9.5 (*)    Monocytes Absolute 1.3 (*)    All other components within normal limits  COMPREHENSIVE METABOLIC PANEL - Abnormal; Notable for the following components:   CO2 20 (*)    Glucose, Bld 109 (*)    BUN 26 (*)    Calcium 8.3 (*)    Albumin 3.4 (*)    All other components within normal limits  CK - Abnormal; Notable for the following components:   Total CK 310 (*)    All other components within normal limits  TROPONIN I (HIGH SENSITIVITY) - Abnormal; Notable for the following components:   Troponin I (High Sensitivity) 41 (*)    All other components within normal limits    Otherwise labs showing:    Recent Labs  Lab 12/11/19 1929  NA 142  K 4.6  CO2 20*  GLUCOSE 109*  BUN 26*  CREATININE 0.82  CALCIUM 8.3*    Cr  Stable  Lab Results  Component Value Date   CREATININE 0.82 12/11/2019   CREATININE 1.49 (H) 11/06/2019   CREATININE 0.99 12/09/2016    Recent Labs  Lab 12/11/19 1929  AST 35  ALT 34  ALKPHOS 67  BILITOT 1.0  PROT 6.5  ALBUMIN 3.4*   Lab Results  Component Value Date   CALCIUM 8.3 (L) 12/11/2019      WBC       Component Value Date/Time   WBC 12.4 (H) 12/11/2019 1929   ANC    Component Value Date/Time   NEUTROABS 9.5 (H) 12/11/2019 1929   ALC No components found for: LYMPHAB    Plt: Lab Results  Component Value Date   PLT 176 12/11/2019     Lactic Acid, Venous    Component Value Date/Time   LATICACIDVEN 0.8 12/11/2019 2238      COVID-19 Labs  Recent Labs    12/11/19 2300  DDIMER 0.74*    Lab Results  Component Value Date   SARSCOV2NAA NEGATIVE 12/11/2019     HG/HCT   stable,      Component Value Date/Time   HGB 11.4 (L) 12/11/2019 1929   HCT 36.7 12/11/2019 1929     Recent Labs  Lab 12/11/19 2238  AMMONIA 10  Troponin 41 Cardiac Panel (last 3  results) Recent Labs    12/11/19 1929  CKTOTAL 310*     ECG: Ordered Personally reviewed by me showing: HR : 107 Rhythm:  NSR,    no evidence of ischemic changes QTC 422   BNP (last 3 results) Recent Labs    12/11/19 1954  BNP 105.3*         UA ordered   Urine analysis:    Component Value Date/Time   COLORURINE YELLOW 12/11/2019 2130   APPEARANCEUR CLEAR 12/11/2019 2130   LABSPEC 1.025 12/11/2019 2130   PHURINE 5.5 12/11/2019 2130   GLUCOSEU NEGATIVE 12/11/2019 2130   HGBUR NEGATIVE 12/11/2019 2130   BILIRUBINUR SMALL (A) 12/11/2019 2130   KETONESUR 40 (A) 12/11/2019 2130   PROTEINUR NEGATIVE 12/11/2019 2130   UROBILINOGEN 0.2 07/11/2009 2220   NITRITE NEGATIVE 12/11/2019 2130   LEUKOCYTESUR TRACE (A) 12/11/2019 2130       Ordered  CT HEAD/Neck   NON acute  CXR -  NON acute      Pelvis NOn acute    ED Triage Vitals  Enc Vitals Group     BP 12/11/19 1919 (!) 179/69     Pulse Rate 12/11/19 1919 (!) 106     Resp 12/11/19 1919 19     Temp 12/11/19 1919 98 F (36.7 C)     Temp Source 12/11/19 1919 Oral     SpO2 12/11/19 1917 98 %     Weight --      Height --      Head Circumference --      Peak Flow --      Pain Score --      Pain Loc --      Pain Edu? --      Excl. in [place]? --   TMAX(24)@       Latest  Blood pressure (!) 156/80, pulse 100, temperature 98 F (36.7 C), temperature source Oral, resp. rate 22, SpO2 98 %.      Review of Systems:    Pertinent positives include: fall  Constitutional:  No weight loss, night sweats, Fevers, chills, fatigue, weight loss  HEENT:  No headaches, Difficulty swallowing,Tooth/dental problems,Sore throat,  No sneezing, itching, ear ache, nasal congestion, post nasal drip,  Cardio-vascular:  No chest pain, Orthopnea, PND, anasarca, dizziness, palpitations.no Bilateral lower extremity swelling  GI:  No heartburn, indigestion, abdominal pain, nausea, vomiting, diarrhea, change in bowel habits, loss of  appetite, melena, blood in stool, hematemesis Resp:  no shortness of breath at rest. No dyspnea on exertion, No excess mucus, no productive cough, No non-productive cough, No coughing up of blood.No change in color of mucus.No wheezing. Skin:  no rash or lesions. No jaundice GU:  no dysuria, change in color of urine, no urgency or frequency. No straining to urinate.  No flank pain.  Musculoskeletal:  No joint pain or no joint swelling. No decreased range of motion. No back pain.  Psych:  No change in mood or affect. No depression or anxiety. No memory loss.  Neuro: no localizing neurological complaints, no tingling, no weakness, no double vision, no gait abnormality, no slurred speech, no confusion  All systems reviewed and apart from [place] all are negative  Past Medical History:   Past Medical History:  Diagnosis Date  . Acid reflux   . Asthma    Asthmatic COPD  . Atrial fibrillation ([place]) 01/01/2016  . Coronary artery disease    Echo 07/27/2011   .  Coronary artery stenosis    , High-grade left main  . Dementia ([place])   . Essential tremor 03/16/2018  . GERD (gastroesophageal reflux disease)   . Hyperlipidemia   . Hypertension   . IBS (irritable bowel syndrome)   . Psoriasis      Past Surgical History:  Procedure Laterality Date  . CARDIAC SURGERY    . CORONARY ARTERY BYPASS GRAFT  1994   After catheterization showed 90% ostial left main stenosis  . DOPPLER ECHOCARDIOGRAPHY     Study showed mild to moderate MR with moderate TR, mild to moderate pulmonary hypertension with an ejection fraction greater that 55%.   [name] EYE SURGERY    . Nuclear Perfusion  10/2010, 11/15/2012   Showed normal perfusion, No Ischemia or Infarction, Normal EF  . Renal Scan Duplex  04/09/2012   Showing greater than 50% diameter reduction in the celiac artery and SMA.. Right proximal 275 and Mid 152.  [name] ROTATOR CUFF REPAIR     bilateral  . Sleep Study  12/28/2006   AHI during total sleep time (3h  19 minutes) was 1.81/hr and during REM sleep at 17.78/hr. Mild sleep apnea during REM sleep.Oxygen staturated rate during REM and NREM was 93.0%.    Social History:  Ambulatory  cane, walker     reports that she quit smoking about 57 years ago. She has a 18.00 pack-year smoking history. She has never used smokeless tobacco. She reports that she does not drink alcohol and does not use drugs.   Family History:   Family History  Problem Relation Age of Onset  . Heart disease Mother   . Hypertension Sister   . Colon cancer Brother   . Cancer Brother     Allergies: Allergies  Allergen Reactions  . Penicillins Hives    Has patient had a PCN reaction causing immediate rash, facial/tongue/throat swelling, SOB or lightheadedness with hypotension: No Has patient had a PCN reaction causing severe rash involving mucus membranes or skin necrosis: Yes Has patient had a PCN reaction that required hospitalization No Has patient had a PCN reaction occurring within the last 10 years: Yes took again last year 2016 If all of the above answers are "NO", then may proceed with Cephalosporin use.   . Naprosyn [Naproxen] Nausea Only    diarrhea  . Sertraline Diarrhea  . Lactose Intolerance (Gi) Other (See Comments)    Unknown- [facility] documented     Prior to Admission medications   Medication Sig Start Date End Date Taking? Authorizing Provider  folic acid (FOLVITE) 1 MG tablet Take 1 tablet (1 mg total) by mouth daily. 01/06/16  Yes Rai, Ripudeep K, MD  Acetaminophen (TYLENOL ARTHRITIS EXT RELIEF PO) Take by mouth.    [provider]  albuterol (VENTOLIN HFA) 108 (90 Base) MCG/ACT inhaler INHALE 2 PUFFS BY MOUTH EVERY 4 TO 6 HOURS AS NEEDED FOR COUGH OR  WHEEZING 03/26/19   Kozlow, Donnamarie Poag, MD  amLODipine (NORVASC) 2.5 MG tablet TAKE 1 TABLET BY MOUTH ONCE DAILY AT BEDTIME 09/04/19   Almyra Deforest, PA  aspirin 81 MG tablet Take 81 mg by mouth daily.     [provider]  augmented betamethasone dipropionate (DIPROLENE-AF) 0.05 % ointment APPLY EXTERNALLY ONCE OR TWICE A DAY 07/05/18   [provider]  Budeson-Glycopyrrol-Formoterol (BREZTRI AEROSPHERE) 160-9-4.8 MCG/ACT AERO Inhale 2 puffs into the lungs 2 (two) times daily. 03/20/19   Kozlow, Donnamarie Poag, MD  donepezil (ARICEPT) 10 MG  tablet Take 1 tablet by mouth once daily 03/28/19   Kathrynn Ducking, MD  DULoxetine (CYMBALTA) 60 MG capsule Take 60 mg by mouth daily. Reported on 08/05/2015 09/13/14   [provider]  fluticasone (FLONASE) 50 MCG/ACT nasal spray USE 1 TO 2 SPRAY(S) IN EACH NOSTRIL ONCE DAILY FOR  STUFFY  NOSE  OR  DRAINAGE 12/04/18   Kozlow, Donnamarie Poag, MD  fluticasone furoate-vilanterol (BREO ELLIPTA) 200-25 MCG/INH AEPB Inhale 1 puff into the lungs daily. 05/23/19   Kozlow, Donnamarie Poag, MD  furosemide (LASIX) 20 MG tablet Take 1 tablet by mouth once daily 09/25/19   [name], MD  ipratropium (ATROVENT) 0.06 % nasal spray Place 1-2 sprays into both nostrils 3 (three) times daily. As needed. 03/20/19   Kozlow, Donnamarie Poag, MD  methylPREDNISolone acetate (DEPO-MEDROL) 40 MG/ML injection SMARTSIG:2 Milligram(s) IM Once PRN 06/21/19   [provider]  metoprolol tartrate (LOPRESSOR) 50 MG tablet Take 1 tablet (50 mg total) by mouth 2 (two) times daily. Take 1 tablet twice a day 08/06/19   [name], MD  omeprazole (PRILOSEC) 20 MG capsule Take 20 mg by mouth 2 (two) times daily before a meal.    [provider]  pantoprazole (PROTONIX) 40 MG tablet Take 1 tablet (40 mg total) by mouth 2 (two) times daily. 03/20/19   Kozlow, Donnamarie Poag, MD  pentoxifylline (TRENTAL) 400 MG CR tablet Take 400 mg by mouth 2 (two) times daily. 07/18/19   [provider]  Probiotic Product (PROBIOTIC DAILY) CAPS Take 1 capsule by mouth daily.    [provider]  Turmeric 500 MG CAPS Take 1,000 mg by mouth.    [provider]   Physical Exam: Vitals with BMI 12/11/2019  12/11/2019 11/06/2019  Height - - -  Weight - - -  BMI - - -  Systolic [phone]  Diastolic 80 69 35  Pulse 940 106 74     1. General:  in No  Acute distress    Chronically ill  -appearing 2. Psychological: Alert and  Oriented 3. Head/ENT:    Dry Mucous Membranes                          Head Non traumatic, neck supple                           Poor Dentition 4. SKIN:   decreased Skin turgor,  Skin clean Dry and intact no rash 5. Heart: Regular rate and rhythm no Murmur, no Rub or gallop 6. Lungs:   no wheezes or crackles   7. Abdomen: Soft,  non-tender, Non distended  bowel sounds present 8. Lower extremities: no clubbing, cyanosis, no edema 9. Neurologically Grossly intact, moving all 4 extremities equally   10. MSK: Normal range of motion   All other LABS:     Recent Labs  Lab 12/11/19 1929  WBC 12.4*  NEUTROABS 9.5*  HGB 11.4*  HCT 36.7  MCV 91.3  PLT 176     Recent Labs  Lab 12/11/19 1929  NA 142  K 4.6  CL 109  CO2 20*  GLUCOSE 109*  BUN 26*  CREATININE 0.82  CALCIUM 8.3*     Recent Labs  Lab 12/11/19 1929  AST 35  ALT 34  ALKPHOS 67  BILITOT 1.0  PROT 6.5  ALBUMIN 3.4*       Cultures:  Component Value Date/Time   SDES URINE, CATHETERIZED 02/19/2016 2220   SPECREQUEST NONE 02/19/2016 2220   CULT NO GROWTH 02/19/2016 2220   REPTSTATUS 02/21/2016 FINAL 02/19/2016 2220     Radiological Exams on Admission: DG Chest 1 View  Result Date: 12/11/2019 CLINICAL DATA:  Fall at home. EXAM: CHEST  1 VIEW COMPARISON:  Chest radiograph 02/22/2018 FINDINGS: Median sternotomy and CABG.The cardiomediastinal contours are normal. Upper normal heart size with aortic atherosclerosis. The lungs are clear. Pulmonary vasculature is normal. No consolidation, pleural effusion, or pneumothorax. No acute osseous abnormalities are seen. Bones appear under mineralized. IMPRESSION: 1. No acute chest findings. 2. Median sternotomy and CABG. Aortic Atherosclerosis  (ICD10-I70.0). Electronically Signed   By: Keith Rake M.D.   On: 12/11/2019 21:07   DG Pelvis 1-2 Views  Result Date: 12/11/2019 CLINICAL DATA:  Fall at home. EXAM: PELVIS - 1-2 VIEW COMPARISON:  Pelvis radiograph 02/20/2016. FINDINGS: The cortical margins of the bony pelvis are intact. No fracture. Pubic symphysis and sacroiliac joints are congruent. Age related degenerative change of both hips and the sacroiliac joints. Both femoral heads are well-seated in the respective acetabula. Bones diffusely under mineralized. IMPRESSION: No acute pelvic fracture. Electronically Signed   By: Keith Rake M.D.   On: 12/11/2019 21:07   DG Tibia/Fibula Right  Result Date: 12/11/2019 CLINICAL DATA:  Fall at home with bruising to right lower leg. EXAM: RIGHT TIBIA AND FIBULA - 2 VIEW COMPARISON:  None. FINDINGS: Cortical margins of the tibia and fibular intact. There is no evidence of fracture or other focal bone lesions. Knee and ankle alignment are maintained. The bones are under mineralized. Multiple surgical clips in the medial soft tissues. Soft tissues are unremarkable. IMPRESSION: No fracture of the right lower leg. Electronically Signed   By: Keith Rake M.D.   On: 12/11/2019 21:03   CT Head Wo Contrast  Result Date: 12/11/2019 CLINICAL DATA:  Change in mental status EXAM: CT HEAD WITHOUT CONTRAST TECHNIQUE: Contiguous axial images were obtained from the base of the skull through the vertex without intravenous contrast. COMPARISON:  February 20, 2016 FINDINGS: Brain: No evidence of acute territorial infarction, hemorrhage, hydrocephalus,extra-axial collection or mass lesion/mass effect. There is dilatation the ventricles and sulci consistent with age-related atrophy. Low-attenuation changes in the deep white matter consistent with small vessel ischemia. Vascular: No hyperdense vessel or unexpected calcification. Skull: The skull is intact. No fracture or focal lesion identified. Sinuses/Orbits:  The visualized paranasal sinuses and mastoid air cells are clear. The orbits and globes intact. Other: None Cervical spine: Alignment: There is a grade 1 anterolisthesis of C3 on C4 measuring 3 mm. There is a minimal anterolisthesis of C4 on C5. Skull base and vertebrae: Visualized skull base is intact. No atlanto-occipital dissociation. The vertebral body heights are well maintained. No fracture or pathologic osseous lesion seen. Soft tissues and spinal canal: The visualized paraspinal soft tissues are unremarkable. No prevertebral soft tissue swelling is seen. The spinal canal is grossly unremarkable, no large epidural collection or significant canal narrowing. Disc levels: Cervical spine spondylosis is seen with disc osteophyte complex and uncovertebral osteophytes most notable at C4-C5 with moderate neural foraminal narrowing and central canal stenosis. Upper chest: Mild centrilobular emphysematous changes seen at both lung apices. Scattered carotid artery calcifications are noted. Thoracic inlet is within normal limits. Other: None IMPRESSION: No acute intracranial abnormality. Findings consistent with age related atrophy and chronic small vessel ischemia No acute fracture or malalignment of the spine. Emphysema (ICD10-J43.9). Electronically Signed  By: Prudencio Pair M.D.   On: 12/11/2019 21:09   CT Cervical Spine Wo Contrast  Result Date: 12/11/2019 CLINICAL DATA:  Change in mental status EXAM: CT HEAD WITHOUT CONTRAST TECHNIQUE: Contiguous axial images were obtained from the base of the skull through the vertex without intravenous contrast. COMPARISON:  February 20, 2016 FINDINGS: Brain: No evidence of acute territorial infarction, hemorrhage, hydrocephalus,extra-axial collection or mass lesion/mass effect. There is dilatation the ventricles and sulci consistent with age-related atrophy. Low-attenuation changes in the deep white matter consistent with small vessel ischemia. Vascular: No hyperdense vessel or  unexpected calcification. Skull: The skull is intact. No fracture or focal lesion identified. Sinuses/Orbits: The visualized paranasal sinuses and mastoid air cells are clear. The orbits and globes intact. Other: None Cervical spine: Alignment: There is a grade 1 anterolisthesis of C3 on C4 measuring 3 mm. There is a minimal anterolisthesis of C4 on C5. Skull base and vertebrae: Visualized skull base is intact. No atlanto-occipital dissociation. The vertebral body heights are well maintained. No fracture or pathologic osseous lesion seen. Soft tissues and spinal canal: The visualized paraspinal soft tissues are unremarkable. No prevertebral soft tissue swelling is seen. The spinal canal is grossly unremarkable, no large epidural collection or significant canal narrowing. Disc levels: Cervical spine spondylosis is seen with disc osteophyte complex and uncovertebral osteophytes most notable at C4-C5 with moderate neural foraminal narrowing and central canal stenosis. Upper chest: Mild centrilobular emphysematous changes seen at both lung apices. Scattered carotid artery calcifications are noted. Thoracic inlet is within normal limits. Other: None IMPRESSION: No acute intracranial abnormality. Findings consistent with age related atrophy and chronic small vessel ischemia No acute fracture or malalignment of the spine. Emphysema (ICD10-J43.9). Electronically Signed   By: Prudencio Pair M.D.   On: 12/11/2019 21:09   DG Knee Complete 4 Views Left  Result Date: 12/11/2019 CLINICAL DATA:  Fall at home.  Bruising to left knee. EXAM: LEFT KNEE - COMPLETE 4+ VIEW COMPARISON:  None. FINDINGS: No evidence of fracture, dislocation, or joint effusion. Minor tricompartmental peripheral spurring most prominent in the patellofemoral compartment. Small quadriceps tendon enthesophyte. Bony under mineralization. Soft tissues are unremarkable. IMPRESSION: 1. No acute fracture or subluxation of the left knee. 2. Mild tricompartmental  osteoarthritis. Electronically Signed   By: Keith Rake M.D.   On: 12/11/2019 21:02    Chart has been reviewed    Assessment/Plan   86 y.o. female with medical history significant of dementia, hypertension, CAD, pulmonary hypertension, COPD occasionally on oxygen, atrial fibrillation on anticoagulation, Essential tremor,   Admitted for dehydration and fall  Present on Admission: . Acute encephalopathy -   - most likely multifactorial secondary to  mild dehydration secondary to decreased by mouth intake,    - Will rehydrate     - Hold contributing medications   - if no improvement may need further imaging to evaluate for CNS pathology pathology such as MRI of the brain at this time appears to be doing better  - neurological exam appears to be nonfocal but patient unable to cooperate fully   - VBG unremarkable no evidence of hypercarbia    - no history of liver disease ammonia unremarkable  . Coronary artery disease involving native coronary artery of native heart with angina pectoris ([place]) continue aspirin statin and beta-blocker currently stable mildly elevated troponin no chest pain or EKG changes   . Dehydration gently rehydrate and follow  . Essential tremor- chronic stable continue beta-blocker   . Essential hypertension -continue  Lopressor  . Fall -will need PT OT assessment prior to discharge and likely placement   . Hyperlipidemia with target LDL less than 70 -chronic stable continue Lipitor  . Mild dementia ([place]) -on Aricept given diarrhea will hold off for now Monitor for any signs of sundowning  . PAF (paroxysmal atrial fibrillation) ([facility]) -patient not anticoagulation secondary to severe risk of fall patient continues to fall recently she hit her head.  Continue Lopressor blood pressure allows  . Pulmonary hypertension ([facility]) chronic stable   . Elevated troponin continue to follow mildly elevated at this time no chest pain no EKG changes most likely demand in  the setting of dehydration  Diarrhea will stop Aricept.  Check gastric panel, C.dif  Hepatic steatosis on recent ultrasound for LFTs  History of biliary dilatation in the past patient have had abnormal dilatation of common bile duct ultrasound on 12 July was not able to remove visualize it the plan was for patient to undergo MRCP at a later time. LFTs at this time appears to be stable continue to monitor  Other plan as per orders.  DVT prophylaxis:  SCD        Code Status:    Code Status: Prior FULL CODE as per patient  I had personally discussed CODE STATUS with patient and family    Family Communication:   Family  at  Bedside  plan of care was discussed  with   Daughter   Disposition Plan:   Likely needs placement  Following barriers for discharge:                            Electrolytes corrected                               Anemia corrected                             Pain controlled with PO medications                               Afebrile, white count improving able to transition to PO antibiotics                             Will need to be able to tolerate PO                            Will likely need home health, home O2, set up                           Will need consultants to evaluate patient prior to discharge                      Would benefit from PT/OT eval prior to DC  Ordered                   Swallow eval - SLP ordered                   Diabetes care coordinator                   Transition of care  consulted                   Nutrition    consulted                                       Consults called: none  Admission status:  ED Disposition    ED Disposition Condition [place]: [place] [[id number]]  Level of Care: Telemetry [5]  Admit to tele based on following criteria: Eval of Syncope  Covid Evaluation: Confirmed COVID Negative  Diagnosis: Fall [[id number]]  Admitting Physician: Toy Baker [[id number]]   Attending Physician: Toy Baker [[id number]]         Obs     Level of care     tele  For  24H     Lab Results  Component Value Date   [place] 12/11/2019     Precautions: admitted as  Covid Negative     PPE: Used by the provider:   N95  eye Goggles,  Gloves       Trenesha Alcaide 12/12/2019, 2:21 AM    [facility]     after 2 AM please page floor coverage PA If 7AM-7PM, please contact the day team taking care of the patient using [email or web address]   Patient was evaluated in the context of the global COVID-19 pandemic, which necessitated consideration that the patient might be at risk for infection with the SARS-CoV-2 virus that causes COVID-19. Institutional protocols and algorithms that pertain to the evaluation of patients at risk for COVID-19 are in a state of rapid change based on information released by regulatory bodies including the CDC and federal and state organizations. These policies and algorithms were followed during the patient's care.

## 2019-12-11 NOTE — ED Provider Notes (Signed)
[facility] DEPT Provider Note   CSN: [id number] Arrival date & time: 12/11/19  1844     History Chief Complaint  Patient presents with  . Fall    Tammy [name] is a 86 y.o. female history of A. Fib not on anticoagulation due to falls, CAD status post CABG, hypertension, hyperlipidemia here presenting with fall.  Patient is demented at baseline and apparently fell 2 days ago.  She is unable to tell me how she fell.  She apparently was laying on the floor and did not pick up the daughter's phone call yesterday.  Daughter went in and check in on her and noticed that she was on the floor covered with feces.  She states that she was unable to get up.  She was noted to have abrasions on bilateral legs.  The history is provided by the patient and a relative.  Level V caveat- dementia, AMS      Past Medical History:  Diagnosis Date  . Acid reflux   . Asthma    Asthmatic COPD  . Atrial fibrillation ([place]) 01/01/2016  . Coronary artery disease    Echo 07/27/2011   . Coronary artery stenosis    , High-grade left main  . Dementia ([place])   . Essential tremor 03/16/2018  . GERD (gastroesophageal reflux disease)   . Hyperlipidemia   . Hypertension   . IBS (irritable bowel syndrome)   . Psoriasis     Patient Active Problem List   Diagnosis Date Noted  . Essential tremor 03/16/2018  . Mild dementia ([place]) 11/08/2017  . Gait difficulty 11/08/2017  . Left groin pain 12/09/2016  . Acute respiratory failure with hypoxia ([place]) 12/08/2016  . Hypothermia 02/20/2016  . SIRS (systemic inflammatory response syndrome) ([place]) 02/19/2016  . Diverticulosis   . Encephalopathy   . Hyponatremia 02/14/2016  . Acute encephalopathy 02/14/2016  . Elevated lactic acid level 02/14/2016  . Dizziness 02/14/2016  . Abdominal pain 02/14/2016  . Symptomatic bradycardia 02/12/2016  . Leukocytosis 02/12/2016  . Depression 02/12/2016  . Renal insufficiency   . SOB (shortness of  breath)   . Bradycardia on ECG 02/11/2016  . Atrial fibrillation with RVR ([place]) 01/01/2016  . PAF (paroxysmal atrial fibrillation) ([place]) 01/01/2016  . Alcohol dependence with withdrawal with complication ([place]) 64/33/2951  . Fall 01/01/2016  . AKI (acute kidney injury) ([place])   . Dehydration   . Exertional dyspnea 09/11/2015  . Memory loss 11/13/2013  . Abnormal involuntary movements(781.0) 11/13/2013  . Pulmonary hypertension ([place]) 11/02/2013  . Generalized weakness 11/02/2013  . Coronary artery disease involving native coronary artery of native heart with angina pectoris ([place]) 11/16/2012  . Mitral regurgitation 11/16/2012  . Renal artery stenosis ([place]) 11/16/2012  . Hyperlipidemia with target LDL less than 70 11/16/2012  . Essential hypertension 11/16/2012    Past Surgical History:  Procedure Laterality Date  . CARDIAC SURGERY    . CORONARY ARTERY BYPASS GRAFT  1994   After catheterization showed 90% ostial left main stenosis  . DOPPLER ECHOCARDIOGRAPHY     Study showed mild to moderate MR with moderate TR, mild to moderate pulmonary hypertension with an ejection fraction greater that 55%.   [name] EYE SURGERY    . Nuclear Perfusion  10/2010, 11/15/2012   Showed normal perfusion, No Ischemia or Infarction, Normal EF  . Renal Scan Duplex  04/09/2012   Showing greater than 50% diameter reduction in the celiac artery and SMA.. Right proximal 275 and Mid 152.  [name]  ROTATOR CUFF REPAIR     bilateral  . Sleep Study  12/28/2006   AHI during total sleep time (3h 19 minutes) was 1.81/hr and during REM sleep at 17.78/hr. Mild sleep apnea during REM sleep.Oxygen staturated rate during REM and NREM was 93.0%.     OB History   No obstetric history on file.     Family History  Problem Relation Age of Onset  . Heart disease Mother   . Hypertension Sister   . Colon cancer Brother   . Cancer Brother     Social History   Tobacco Use  . Smoking status: Former Smoker    Packs/day: 1.00     Years: 18.00    Pack years: 18.00    Quit date: 11/08/1962    Years since quitting: 57.1  . Smokeless tobacco: Never Used  Substance Use Topics  . Alcohol use: No    Alcohol/week: 0.0 standard drinks    Comment: h/o heavy use - 2 bottles of wine daily  . Drug use: No    Home Medications Prior to Admission medications   Medication Sig Start Date End Date Taking? Authorizing Provider  Acetaminophen (TYLENOL ARTHRITIS EXT RELIEF PO) Take by mouth.    [provider]  albuterol (VENTOLIN HFA) 108 (90 Base) MCG/ACT inhaler INHALE 2 PUFFS BY MOUTH EVERY 4 TO 6 HOURS AS NEEDED FOR COUGH OR  WHEEZING 03/26/19   Kozlow, Donnamarie Poag, MD  amLODipine (NORVASC) 2.5 MG tablet TAKE 1 TABLET BY MOUTH ONCE DAILY AT BEDTIME 09/04/19   Almyra Deforest, PA  aspirin 81 MG tablet Take 81 mg by mouth daily.     [provider]  augmented betamethasone dipropionate (DIPROLENE-AF) 0.05 % ointment APPLY EXTERNALLY ONCE OR TWICE A DAY 07/05/18   [provider]  Budeson-Glycopyrrol-Formoterol (BREZTRI AEROSPHERE) 160-9-4.8 MCG/ACT AERO Inhale 2 puffs into the lungs 2 (two) times daily. 03/20/19   Kozlow, Donnamarie Poag, MD  donepezil (ARICEPT) 10 MG tablet Take 1 tablet by mouth once daily 03/28/19   Kathrynn Ducking, MD  DULoxetine (CYMBALTA) 60 MG capsule Take 60 mg by mouth daily. Reported on 08/05/2015 09/13/14   [provider]  fluticasone (FLONASE) 50 MCG/ACT nasal spray USE 1 TO 2 SPRAY(S) IN EACH NOSTRIL ONCE DAILY FOR  STUFFY  NOSE  OR  DRAINAGE 12/04/18   Kozlow, Donnamarie Poag, MD  fluticasone furoate-vilanterol (BREO ELLIPTA) 200-25 MCG/INH AEPB Inhale 1 puff into the lungs daily. 05/23/19   Kozlow, Donnamarie Poag, MD  folic acid (FOLVITE) 1 MG tablet Take 1 tablet (1 mg total) by mouth daily. 01/06/16   Rai, Vernelle Emerald, MD  furosemide (LASIX) 20 MG tablet Take 1 tablet by mouth once daily 09/25/19   [name], MD  ipratropium (ATROVENT) 0.06 % nasal spray Place 1-2 sprays into both nostrils 3 (three) times  daily. As needed. 03/20/19   Kozlow, Donnamarie Poag, MD  methylPREDNISolone acetate (DEPO-MEDROL) 40 MG/ML injection SMARTSIG:2 Milligram(s) IM Once PRN 06/21/19   [provider]  metoprolol tartrate (LOPRESSOR) 50 MG tablet Take 1 tablet (50 mg total) by mouth 2 (two) times daily. Take 1 tablet twice a day 08/06/19   [name], MD  omeprazole (PRILOSEC) 20 MG capsule Take 20 mg by mouth 2 (two) times daily before a meal.    [provider]  pantoprazole (PROTONIX) 40 MG tablet Take 1 tablet (40 mg total) by mouth 2 (two) times daily. 03/20/19   Kozlow, Donnamarie Poag, MD  pentoxifylline (TRENTAL) 400  MG CR tablet Take 400 mg by mouth 2 (two) times daily. 07/18/19   [provider]  Probiotic Product (PROBIOTIC DAILY) CAPS Take 1 capsule by mouth daily.    [provider]  Turmeric 500 MG CAPS Take 1,000 mg by mouth.    [provider]    Allergies    Penicillins, Naprosyn [naproxen], Sertraline, and Lactose intolerance (gi)  Review of Systems   Review of Systems  Neurological: Positive for weakness.  All other systems reviewed and are negative.   Physical Exam Updated Vital Signs BP (!) 179/69   Pulse (!) 106   Temp 98 F (36.7 C) (Oral)   Resp 19   SpO2 96%   Physical Exam Vitals and nursing note reviewed.  Constitutional:      Comments: Chronically ill, disheveled, covered with feces.  HENT:     Head: Normocephalic.     Nose: Nose normal.     Mouth/Throat:     Mouth: Mucous membranes are dry.  Eyes:     Extraocular Movements: Extraocular movements intact.     Pupils: Pupils are equal, round, and reactive to light.  Cardiovascular:     Rate and Rhythm: Normal rate and regular rhythm.     Pulses: Normal pulses.     Heart sounds: Normal heart sounds.  Pulmonary:     Effort: Pulmonary effort is normal.     Breath sounds: Normal breath sounds.  Abdominal:     General: Abdomen is flat.  Musculoskeletal:     Cervical back: Normal range of  motion.     Comments: Abrasion on the left knee and abrasion on the right tib-fib.  No sacral decub ulcer.  Skin:    General: Skin is warm.     Capillary Refill: Capillary refill takes less than 2 seconds.  Neurological:     General: No focal deficit present.     Comments: Demented, normal strength bilateral arms and legs.  Psychiatric:        Mood and Affect: Mood normal.        Behavior: Behavior normal.     ED Results / Procedures / Treatments   Labs (all labs ordered are listed, but only abnormal results are displayed) Labs Reviewed  SARS CORONAVIRUS 2 BY RT PCR (HOSPITAL ORDER, [facility])  CBC WITH DIFFERENTIAL/PLATELET  COMPREHENSIVE METABOLIC PANEL  CK  URINALYSIS, ROUTINE W REFLEX MICROSCOPIC  TROPONIN I (HIGH SENSITIVITY)    EKG EKG Interpretation  Date/Time:  Tuesday December 11 2019 19:42:04 EDT Ventricular Rate:  107 PR Interval:    QRS Duration: 84 QT Interval:  316 QTC Calculation: 422 R Axis:   52 Text Interpretation: Sinus tachycardia Nonspecific repol abnormality, diffuse leads No significant change since last tracing Confirmed by Wandra Arthurs [phone]) on 12/11/2019 8:05:56 PM   Radiology No results found.  Procedures Procedures (including critical care time)  Medications Ordered in ED Medications  sodium chloride 0.9 % bolus 1,000 mL (1,000 mLs Intravenous New Bag/Given 12/11/19 1952)    ED Course  I have reviewed the triage vital signs and the nursing notes.  Pertinent labs & imaging results that were available during my care of the patient were reviewed by me and considered in my medical decision making (see chart for details).    MDM Rules/Calculators/A&P                          Tammy [name] is  a 86 y.o. female who presenting with weakness and fall.  Unclear how patient fell.  Patient was on the floor for 2 days and covered with feces.  Will get CT head and neck and x-rays to rule out fracture or bleed.  I am  concerned that maybe she has developed rhabdomyolysis.  Plan to get CBC, CMP, CK level, UA, chest x-ray.  Will hydrate patient and likely will need admission.  10:35 PM Labs unremarkable. Trop 41. Concerned that she may have went into rapid A. fib. Patient is still weak and unsteady. Will admit for possible arrhythmia and may need placement.  Final Clinical Impression(s) / ED Diagnoses Final diagnoses:  None    Rx / DC Orders ED Discharge Orders    None       Drenda Freeze, MD 12/11/19 2237

## 2019-12-11 NOTE — ED Triage Notes (Signed)
Pt arrived via EMS from home. Pt lives at home alone. Pt has hx of dementia. Pt was found by daughter after falling at home. Pt may have been on the floor for 2 days.

## 2019-12-12 ENCOUNTER — Observation Stay (HOSPITAL_COMMUNITY): Payer: PPO

## 2019-12-12 DIAGNOSIS — E785 Hyperlipidemia, unspecified: Secondary | ICD-10-CM | POA: Diagnosis present

## 2019-12-12 DIAGNOSIS — R55 Syncope and collapse: Secondary | ICD-10-CM

## 2019-12-12 DIAGNOSIS — I4891 Unspecified atrial fibrillation: Secondary | ICD-10-CM | POA: Diagnosis not present

## 2019-12-12 DIAGNOSIS — I272 Pulmonary hypertension, unspecified: Secondary | ICD-10-CM | POA: Diagnosis present

## 2019-12-12 DIAGNOSIS — I10 Essential (primary) hypertension: Secondary | ICD-10-CM | POA: Diagnosis not present

## 2019-12-12 DIAGNOSIS — I480 Paroxysmal atrial fibrillation: Secondary | ICD-10-CM | POA: Diagnosis present

## 2019-12-12 DIAGNOSIS — I110 Hypertensive heart disease with heart failure: Secondary | ICD-10-CM | POA: Diagnosis present

## 2019-12-12 DIAGNOSIS — G9340 Encephalopathy, unspecified: Secondary | ICD-10-CM | POA: Diagnosis not present

## 2019-12-12 DIAGNOSIS — E669 Obesity, unspecified: Secondary | ICD-10-CM | POA: Diagnosis present

## 2019-12-12 DIAGNOSIS — I2510 Atherosclerotic heart disease of native coronary artery without angina pectoris: Secondary | ICD-10-CM | POA: Diagnosis present

## 2019-12-12 DIAGNOSIS — W19XXXA Unspecified fall, initial encounter: Secondary | ICD-10-CM | POA: Diagnosis present

## 2019-12-12 DIAGNOSIS — D72829 Elevated white blood cell count, unspecified: Secondary | ICD-10-CM | POA: Diagnosis present

## 2019-12-12 DIAGNOSIS — Z7901 Long term (current) use of anticoagulants: Secondary | ICD-10-CM | POA: Diagnosis not present

## 2019-12-12 DIAGNOSIS — Z20822 Contact with and (suspected) exposure to covid-19: Secondary | ICD-10-CM | POA: Diagnosis present

## 2019-12-12 DIAGNOSIS — I25119 Atherosclerotic heart disease of native coronary artery with unspecified angina pectoris: Secondary | ICD-10-CM | POA: Diagnosis present

## 2019-12-12 DIAGNOSIS — G9341 Metabolic encephalopathy: Secondary | ICD-10-CM | POA: Diagnosis present

## 2019-12-12 DIAGNOSIS — E440 Moderate protein-calorie malnutrition: Secondary | ICD-10-CM | POA: Diagnosis present

## 2019-12-12 DIAGNOSIS — J449 Chronic obstructive pulmonary disease, unspecified: Secondary | ICD-10-CM | POA: Diagnosis present

## 2019-12-12 DIAGNOSIS — G250 Essential tremor: Secondary | ICD-10-CM | POA: Diagnosis present

## 2019-12-12 DIAGNOSIS — F0390 Unspecified dementia without behavioral disturbance: Secondary | ICD-10-CM | POA: Diagnosis present

## 2019-12-12 DIAGNOSIS — E860 Dehydration: Secondary | ICD-10-CM | POA: Diagnosis present

## 2019-12-12 DIAGNOSIS — Z79899 Other long term (current) drug therapy: Secondary | ICD-10-CM | POA: Diagnosis not present

## 2019-12-12 DIAGNOSIS — Y92009 Unspecified place in unspecified non-institutional (private) residence as the place of occurrence of the external cause: Secondary | ICD-10-CM | POA: Diagnosis not present

## 2019-12-12 DIAGNOSIS — R197 Diarrhea, unspecified: Secondary | ICD-10-CM | POA: Diagnosis present

## 2019-12-12 DIAGNOSIS — I5032 Chronic diastolic (congestive) heart failure: Secondary | ICD-10-CM | POA: Diagnosis present

## 2019-12-12 DIAGNOSIS — Z800 Family history of malignant neoplasm of digestive organs: Secondary | ICD-10-CM | POA: Diagnosis not present

## 2019-12-12 DIAGNOSIS — Z6831 Body mass index (BMI) 31.0-31.9, adult: Secondary | ICD-10-CM | POA: Diagnosis not present

## 2019-12-12 DIAGNOSIS — K760 Fatty (change of) liver, not elsewhere classified: Secondary | ICD-10-CM | POA: Diagnosis present

## 2019-12-12 DIAGNOSIS — Z7982 Long term (current) use of aspirin: Secondary | ICD-10-CM | POA: Diagnosis not present

## 2019-12-12 LAB — CBC WITH DIFFERENTIAL/PLATELET
Abs Immature Granulocytes: 0.04 10*3/uL (ref 0.00–0.07)
Basophils Absolute: 0 10*3/uL (ref 0.0–0.1)
Basophils Relative: 0 %
Eosinophils Absolute: 0.2 10*3/uL (ref 0.0–0.5)
Eosinophils Relative: 1 %
HCT: 36 % (ref 36.0–46.0)
Hemoglobin: 11.3 g/dL — ABNORMAL LOW (ref 12.0–15.0)
Immature Granulocytes: 0 %
Lymphocytes Relative: 11 %
Lymphs Abs: 1.3 10*3/uL (ref 0.7–4.0)
MCH: 28.9 pg (ref 26.0–34.0)
MCHC: 31.4 g/dL (ref 30.0–36.0)
MCV: 92.1 fL (ref 80.0–100.0)
Monocytes Absolute: 1.1 10*3/uL — ABNORMAL HIGH (ref 0.1–1.0)
Monocytes Relative: 9 %
Neutro Abs: 9.4 10*3/uL — ABNORMAL HIGH (ref 1.7–7.7)
Neutrophils Relative %: 79 %
Platelets: 176 10*3/uL (ref 150–400)
RBC: 3.91 MIL/uL (ref 3.87–5.11)
RDW: 14.7 % (ref 11.5–15.5)
WBC: 12 10*3/uL — ABNORMAL HIGH (ref 4.0–10.5)
nRBC: 0 % (ref 0.0–0.2)

## 2019-12-12 LAB — COMPREHENSIVE METABOLIC PANEL
ALT: 30 U/L (ref 0–44)
AST: 37 U/L (ref 15–41)
Albumin: 2.9 g/dL — ABNORMAL LOW (ref 3.5–5.0)
Alkaline Phosphatase: 59 U/L (ref 38–126)
Anion gap: 10 (ref 5–15)
BUN: 25 mg/dL — ABNORMAL HIGH (ref 8–23)
CO2: 20 mmol/L — ABNORMAL LOW (ref 22–32)
Calcium: 8.2 mg/dL — ABNORMAL LOW (ref 8.9–10.3)
Chloride: 112 mmol/L — ABNORMAL HIGH (ref 98–111)
Creatinine, Ser: 0.81 mg/dL (ref 0.44–1.00)
GFR calc Af Amer: >60 mL/min (ref >60)
GFR calc non Af Amer: >60 mL/min (ref >60)
Glucose, Bld: 111 mg/dL — ABNORMAL HIGH (ref 70–99)
Potassium: 3.7 mmol/L (ref 3.5–5.1)
Sodium: 142 mmol/L (ref 135–145)
Total Bilirubin: 0.7 mg/dL (ref 0.3–1.2)
Total Protein: 5.9 g/dL — ABNORMAL LOW (ref 6.5–8.1)

## 2019-12-12 LAB — ECHOCARDIOGRAM COMPLETE
Area-P 1/2: 2.6 cm2
S' Lateral: 2.45 cm

## 2019-12-12 LAB — MAGNESIUM: Magnesium: 2.2 mg/dL (ref 1.7–2.4)

## 2019-12-12 LAB — TROPONIN I (HIGH SENSITIVITY)
Troponin I (High Sensitivity): 50 ng/L — ABNORMAL HIGH (ref <18)
Troponin I (High Sensitivity): 49 ng/L — ABNORMAL HIGH (ref <18)

## 2019-12-12 LAB — TSH: TSH: 0.473 u[IU]/mL (ref 0.350–4.500)

## 2019-12-12 LAB — PHOSPHORUS: Phosphorus: 2.8 mg/dL (ref 2.5–4.6)

## 2019-12-12 LAB — CK: Total CK: 178 U/L (ref 38–234)

## 2019-12-12 MED ORDER — ONDANSETRON HCL 4 MG/2ML IJ SOLN: 4.0000 mg | Freq: Four times a day (QID) | INTRAMUSCULAR | Status: DC | PRN

## 2019-12-12 MED ORDER — ATORVASTATIN CALCIUM 10 MG PO TABS
10.0000 mg | ORAL_TABLET | Freq: Every day | ORAL | Status: DC
  Administered 2019-12-12 – 2019-12-17 (×6): 10 mg via ORAL
  Filled 2019-12-12 (×6): qty 1

## 2019-12-12 MED ORDER — SODIUM CHLORIDE 0.9% FLUSH
3.0000 mL | Freq: Two times a day (BID) | INTRAVENOUS | Status: DC
  Administered 2019-12-12 – 2019-12-16 (×4): 3 mL via INTRAVENOUS

## 2019-12-12 MED ORDER — IPRATROPIUM BROMIDE 0.02 % IN SOLN
0.5000 mg | Freq: Four times a day (QID) | RESPIRATORY_TRACT | Status: DC
  Administered 2019-12-12: 0.5 mg via RESPIRATORY_TRACT
  Filled 2019-12-12 (×2): qty 2.5

## 2019-12-12 MED ORDER — METOPROLOL TARTRATE 25 MG PO TABS
25.0000 mg | ORAL_TABLET | Freq: Two times a day (BID) | ORAL | Status: DC
  Administered 2019-12-12 – 2019-12-17 (×12): 25 mg via ORAL
  Filled 2019-12-12 (×11): qty 1

## 2019-12-12 MED ORDER — ALBUTEROL SULFATE (2.5 MG/3ML) 0.083% IN NEBU: 2.5000 mg | INHALATION_SOLUTION | RESPIRATORY_TRACT | Status: DC | PRN

## 2019-12-12 MED ORDER — FLUTICASONE FUROATE-VILANTEROL 200-25 MCG/INH IN AEPB
1.0000 | INHALATION_SPRAY | Freq: Every day | RESPIRATORY_TRACT | Status: DC
  Administered 2019-12-13 – 2019-12-17 (×5): 1 via RESPIRATORY_TRACT
  Filled 2019-12-12: qty 28

## 2019-12-12 MED ORDER — SODIUM CHLORIDE 0.9 % IV SOLN: INTRAVENOUS | Status: AC

## 2019-12-12 MED ORDER — PANTOPRAZOLE SODIUM 40 MG PO TBEC
40.0000 mg | DELAYED_RELEASE_TABLET | Freq: Two times a day (BID) | ORAL | Status: DC
  Administered 2019-12-12 – 2019-12-17 (×12): 40 mg via ORAL
  Filled 2019-12-12 (×12): qty 1

## 2019-12-12 MED ORDER — ONDANSETRON HCL 4 MG PO TABS: 4.0000 mg | ORAL_TABLET | Freq: Four times a day (QID) | ORAL | Status: DC | PRN

## 2019-12-12 MED ORDER — PENTOXIFYLLINE ER 400 MG PO TBCR
400.0000 mg | EXTENDED_RELEASE_TABLET | Freq: Two times a day (BID) | ORAL | Status: DC
  Administered 2019-12-12 – 2019-12-17 (×12): 400 mg via ORAL
  Filled 2019-12-12 (×14): qty 1

## 2019-12-12 MED ORDER — FOLIC ACID 1 MG PO TABS
1.0000 mg | ORAL_TABLET | Freq: Every day | ORAL | Status: DC
  Administered 2019-12-12 – 2019-12-17 (×6): 1 mg via ORAL
  Filled 2019-12-12 (×6): qty 1

## 2019-12-12 MED ORDER — ACETAMINOPHEN 650 MG RE SUPP: 650.0000 mg | Freq: Four times a day (QID) | RECTAL | Status: DC | PRN

## 2019-12-12 MED ORDER — ACETAMINOPHEN 325 MG PO TABS
650.0000 mg | ORAL_TABLET | Freq: Four times a day (QID) | ORAL | Status: DC | PRN
  Administered 2019-12-12 – 2019-12-14 (×5): 650 mg via ORAL
  Filled 2019-12-12 (×5): qty 2

## 2019-12-12 MED ORDER — ASPIRIN 81 MG PO CHEW
81.0000 mg | CHEWABLE_TABLET | Freq: Every day | ORAL | Status: DC
  Administered 2019-12-12 – 2019-12-17 (×6): 81 mg via ORAL
  Filled 2019-12-12 (×6): qty 1

## 2019-12-12 MED ORDER — DULOXETINE HCL 60 MG PO CPEP
60.0000 mg | ORAL_CAPSULE | Freq: Every day | ORAL | Status: DC
  Administered 2019-12-12 – 2019-12-17 (×6): 60 mg via ORAL
  Filled 2019-12-12 (×5): qty 1
  Filled 2019-12-12: qty 2

## 2019-12-12 MED ORDER — DONEPEZIL HCL 5 MG PO TABS: 10.0000 mg | ORAL_TABLET | Freq: Every day | ORAL | Status: DC

## 2019-12-12 NOTE — Progress Notes (Signed)
Carotid artery duplex has been completed. Preliminary results can be found in CV Proc through chart review.   12/12/19 12:03 PM Tammy [name] RVT

## 2019-12-12 NOTE — Evaluation (Signed)
Physical Therapy Evaluation Patient Details Name: Tammy [name] MRN: [id number] DOB: 1931/03/18 Today's Date: 12/12/2019   History of Present Illness  Patient is a 86 year old female with history of dementia, hypertension, CAD, pulmonary hypertension, COPD, occasionally on O2, A. fib on anticoagulation, essential tremor presented from home, found by daughter on the floor.  She lives at home alone, has dementia, possibly had been on the floor for 2 to 3 days.  Family became concerned when she was not answering her phone, found her on the floor covered in feces, unable to get up, had abrasions on bilateral legs.  Patient was unsure how she fell.  Pt admitted with Acute metabolic encephalopathy superimposed on dementia and dehydration.  Clinical Impression   Pt admitted with above diagnosis. Pt requiring min A and cues for safety with transfers and gait.  She was able to ambulate 200' with RW but required assist for balance.  She lives alone and was admitted for recent fall. Pt with decreased mobility, strength, endurance, safety, and balance.  Recommend SNF at d/c due to decreased support at home and fall risk. Pt currently with functional limitations due to the deficits listed below (see PT Problem List). Pt will benefit from skilled PT to increase their independence and safety with mobility to allow discharge to the venue listed below.       Follow Up Recommendations SNF;Supervision/Assistance - 24 hour    Equipment Recommendations  Other (comment) (rollator (reports hers is old))    Recommendations for Other Services       Precautions / Restrictions Precautions Precautions: Fall      Mobility  Bed Mobility Overal bed mobility: Needs Assistance Bed Mobility: Supine to Sit;Sit to Supine     Supine to sit: Min assist Sit to supine: Min assist   General bed mobility comments: increased time and effort; cues for safety  Transfers Overall transfer level: Needs assistance Equipment  used: Rolling walker (2 wheeled) Transfers: Sit to/from Stand Sit to Stand: Min assist         General transfer comment: cues for safe hand placement  Ambulation/Gait Ambulation/Gait assistance: Min assist Gait Distance (Feet): 200 Feet Assistive device: Rolling walker (2 wheeled) Gait Pattern/deviations: Step-through pattern;Decreased stride length;Shuffle Gait velocity: decreased   General Gait Details: Able to ambulate 200' but was unsteady requiring min A for balacne and assist with RW for turns  Stairs            Wheelchair Mobility    Modified Rankin (Stroke Patients Only)       Balance Overall balance assessment: Needs assistance Sitting-balance support: No upper extremity supported;Feet unsupported Sitting balance-Leahy Scale: Fair     Standing balance support: Bilateral upper extremity supported Standing balance-Leahy Scale: Poor Standing balance comment: required use of UE (Unable to don mask in standing without LOB)                             Pertinent Vitals/Pain Pain Assessment: No/denies pain    Home Living Family/patient expects to be discharged to:: Private residence Living Arrangements: Alone Available Help at Discharge: Family;Available PRN/intermittently (Daughter works full time; had a neighbor that assisted but reports he recently passed away) Type of Home: House Home Access: Stairs to enter Entrance Stairs-Rails: Psychiatric nurse of Steps: 4 Home Layout: One level Home Equipment: Environmental consultant - 4 wheels;Kasandra Knudsen - single point (reports walker is old)      Prior Function Level of Independence: Independent  with assistive device(s)         Comments: Reports independent with ADLs and IADLs; denies falls in past 6 months other than this fall     Hand Dominance        Extremity/Trunk Assessment   Upper Extremity Assessment Upper Extremity Assessment: Generalized weakness    Lower Extremity  Assessment Lower Extremity Assessment: LLE deficits/detail;RLE deficits/detail RLE Deficits / Details: ROM WFL: MMT 4/5 LLE Deficits / Details: ROM WFL: MMT 4/5    Cervical / Trunk Assessment Cervical / Trunk Assessment: Kyphotic  Communication   Communication: HOH  Cognition Arousal/Alertness: Awake/alert Behavior During Therapy: Impulsive Overall Cognitive Status: No family/caregiver present to determine baseline cognitive functioning Area of Impairment: Orientation;Problem solving;Awareness;Following commands;Safety/judgement                 Orientation Level: Disoriented to;Time     Following Commands: Follows one step commands with increased time Safety/Judgement: Decreased awareness of deficits;Decreased awareness of safety   Problem Solving: Difficulty sequencing;Requires verbal cues;Requires tactile cues        General Comments General comments (skin integrity, edema, etc.): VSS    Exercises     Assessment/Plan    PT Assessment Patient needs continued PT services  PT Problem List Decreased strength;Decreased mobility;Decreased safety awareness;Decreased range of motion;Decreased activity tolerance;Cardiopulmonary status limiting activity;Decreased balance;Decreased knowledge of use of DME;Decreased coordination;Decreased knowledge of precautions       PT Treatment Interventions DME instruction;Therapeutic activities;Gait training;Therapeutic exercise;Patient/family education;Stair training;Balance training;Functional mobility training    PT Goals (Current goals can be found in the Care Plan section)  Acute Rehab PT Goals Patient Stated Goal: return home to her dog PT Goal Formulation: With patient Time For Goal Achievement: 12/26/19 Potential to Achieve Goals: Fair    Frequency Min 3X/week   Barriers to discharge Decreased caregiver support      Co-evaluation               AM-PAC PT "6 Clicks" Mobility  Outcome Measure Help needed turning  from your back to your side while in a flat bed without using bedrails?: A Little Help needed moving from lying on your back to sitting on the side of a flat bed without using bedrails?: A Little Help needed moving to and from a bed to a chair (including a wheelchair)?: A Little Help needed standing up from a chair using your arms (e.g., wheelchair or bedside chair)?: A Little Help needed to walk in hospital room?: A Little Help needed climbing 3-5 steps with a railing? : A Lot 6 Click Score: 17    End of Session Equipment Utilized During Treatment: Gait belt Activity Tolerance: Patient tolerated treatment well Patient left: in bed;with call bell/phone within reach Nurse Communication: Mobility status PT Visit Diagnosis: Other abnormalities of gait and mobility (R26.89);History of falling (Z91.81);Muscle weakness (generalized) (M62.81)    Time: 5170-0174 PT Time Calculation (min) (ACUTE ONLY): 28 min   Charges:   PT Evaluation $PT Eval Low Complexity: 1 Low PT Treatments $Gait Training: 8-22 mins        Abran Richard, PT Acute Rehab Services Pager [phone] [facility] 12/12/2019, 1:51 PM

## 2019-12-12 NOTE — ED Notes (Signed)
Patient received lunch tray 

## 2019-12-12 NOTE — Progress Notes (Signed)
Clinical/Bedside Swallow Evaluation Patient Details  Name: BRANIYAH BESSE MRN: [id number] Date of Birth: Mar 04, 1931  Today's Date: 12/12/2019 Time: SLP Start Time (ACUTE ONLY): 3664 SLP Stop Time (ACUTE ONLY): 1535 SLP Time Calculation (min) (ACUTE ONLY): 20 min  Past Medical History:  Past Medical History:  Diagnosis Date  . Acid reflux   . Asthma    Asthmatic COPD  . Atrial fibrillation ([place]) 01/01/2016  . Coronary artery disease    Echo 07/27/2011   . Coronary artery stenosis    , High-grade left main  . Dementia ([place])   . Essential tremor 03/16/2018  . GERD (gastroesophageal reflux disease)   . Hyperlipidemia   . Hypertension   . IBS (irritable bowel syndrome)   . Psoriasis    Past Surgical History:  Past Surgical History:  Procedure Laterality Date  . CARDIAC SURGERY    . CORONARY ARTERY BYPASS GRAFT  1994   After catheterization showed 90% ostial left main stenosis  . DOPPLER ECHOCARDIOGRAPHY     Study showed mild to moderate MR with moderate TR, mild to moderate pulmonary hypertension with an ejection fraction greater that 55%.   [name] EYE SURGERY    . Nuclear Perfusion  10/2010, 11/15/2012   Showed normal perfusion, No Ischemia or Infarction, Normal EF  . Renal Scan Duplex  04/09/2012   Showing greater than 50% diameter reduction in the celiac artery and SMA.. Right proximal 275 and Mid 152.  [name] ROTATOR CUFF REPAIR     bilateral  . Sleep Study  12/28/2006   AHI during total sleep time (3h 19 minutes) was 1.81/hr and during REM sleep at 17.78/hr. Mild sleep apnea during REM sleep.Oxygen staturated rate during REM and NREM was 93.0%.   HPI:  "Patient is a 86 year old female with history of dementia, hypertension, CAD, pulmonary hypertension, COPD, occasionally on O2, A. fib on anticoagulation, essential tremor presented from home, found by daughter on the floor.  She lives at home alone, has dementia, possibly had been on the floor for 2 to 3 days.  Family became concerned  when she was not answering her phone, found her on the floor covered in feces, unable to get up, had abrasions on bilateral legs.  Patient was unsure how she fell.  Pt admitted with Acute metabolic encephalopathy superimposed on dementia and dehydration." per md notes  Pt with h/o GERD, essential tremor, etc.   Cervical spine spondylosis is seen with disc osteophyte .  CXR   negative. Swallow eval ordered.    Assessment / Plan / Recommendation Clinical Impression  Pt recalled falling prior to admission.  She is mildy dysarthric but intelligible with slower rate of speech.  Functional oropharyngeal swallow ability noted based on clinical swallow evaluation. Pt without indication of aspiration with minimal po observed including cracker, applesauce and juice.  She does not poor dentition and appears with several caries - advised her to see her dentist when well to assess for need for extraction.  Pt admits to occasional issues coughing with foods - suspect due to decreased mastication abilities.  CXR imaging negative for acute changes.  Recommend continue regular/thin diet - encouraging pt to order soft foods.  Pt does report large pill dysphagia and thus recommend large pills be taken with pudding. All education completed using teach back with pt.  No follow up. Thanks for this consult.   SLP Visit Diagnosis: Dysphagia, oral phase (R13.11)    Aspiration Risk  Mild aspiration risk    Diet Recommendation Dysphagia  3 (Mech soft);Thin liquid (softer foods advised given poor condition of dentition)   Liquid Administration via: Cup;Straw Medication Administration: Whole meds with liquid (large with puree) Supervision: Patient able to self feed Compensations: Slow rate;Small sips/bites Postural Changes: Seated upright at 90 degrees    Other  Recommendations Oral Care Recommendations: Oral care BID   Follow up Recommendations None      Frequency and Duration            Prognosis        Swallow  Study   General Date of Onset: 12/12/19 HPI: "Patient is a 86 year old female with history of dementia, hypertension, CAD, pulmonary hypertension, COPD, occasionally on O2, A. fib on anticoagulation, essential tremor presented from home, found by daughter on the floor.  She lives at home alone, has dementia, possibly had been on the floor for 2 to 3 days.  Family became concerned when she was not answering her phone, found her on the floor covered in feces, unable to get up, had abrasions on bilateral legs.  Patient was unsure how she fell.  Pt admitted with Acute metabolic encephalopathy superimposed on dementia and dehydration." per md notes  Pt with h/o GERD, essential tremor, etc.   Cervical spine spondylosis is seen with disc osteophyte .  CXR   negative. Swallow eval ordered.  Type of Study: Bedside Swallow Evaluation Diet Prior to this Study: Regular;Thin liquids Temperature Spikes Noted: No Respiratory Status: Room air History of Recent Intubation: No Behavior/Cognition: Alert Oral Cavity Assessment: Dry Oral Care Completed by SLP: No Oral Cavity - Dentition: Poor condition Vision: Functional for self-feeding Self-Feeding Abilities: Able to feed self Patient Positioning: Upright in bed Baseline Vocal Quality: Normal Volitional Swallow: Able to elicit    Oral/Motor/Sensory Function Overall Oral Motor/Sensory Function: Within functional limits   Ice Chips Ice chips: Not tested   Thin Liquid Thin Liquid: Within functional limits Presentation: Self Fed;Straw    Nectar Thick Nectar Thick Liquid: Not tested   Honey Thick Honey Thick Liquid: Not tested   Puree Puree: Within functional limits Presentation: Spoon   Solid     Solid: Within functional limits      Macario Golds 12/12/2019,3:58 PM  Kathleen Lime, MS [place] [facility] [phone]

## 2019-12-12 NOTE — ED Notes (Signed)
Delay in transport to the floor due to tech availability. Per Dr Tana Coast, okay to transport without tele.

## 2019-12-12 NOTE — ED Notes (Signed)
PT at bedside with patient

## 2019-12-12 NOTE — Progress Notes (Signed)
[facility]                                                                              Patient Demographics  Tammy [name], is a 86 y.o. female, DOB - [birth year], JIR:[id number]  Admit date - 12/11/2019   Admitting Physician Tammy Baker, MD  Outpatient Primary MD for the patient is Tammy Cruel, MD  Outpatient specialists:   LOS - 0  days   Medical records reviewed and are as summarized below:    Chief Complaint  Patient presents with  . Fall       Brief summary   Patient is a 86 year old female with history of dementia, hypertension, CAD, pulmonary hypertension, COPD, occasionally on O2, A. fib on anticoagulation, essential tremor presented from home, found by daughter on the floor.  She lives at home alone, has dementia, possibly had been on the floor for 2 to 3 days.  Family became concerned when she was not answering her phone, found her on the floor covered in feces, unable to get up, had abrasions on bilateral legs.  Patient was unsure how she fell Has been vaccinated against Covid.  Covid test negative.  Assessment & Plan    Principal Problem:   Acute metabolic encephalopathy superimposed on dementia -Likely worsened due to dehydration, fall, rule out rhabdomyolysis -UA showed ketones however no nitrite, no bacteria, no hypercarbia.  Nonfocal neurological exam, cooperative.  Ammonia level 10. -CT head showed no acute intracranial abnormality, age-related atrophy and chronic small vessel ischemic changes -Continue rehydration, hold any sedating meds.  If no significant improvement, will obtain MRI of the brain. -Currently alert and oriented x2, to self and place.  Active problems  Fall: - possible syncopal episode vs mechanical fall  - PT evaluation, lives alone, appears very deconditioned   Diarrhea, dehydration - cont gentle hydration, follow stool studies  - mild leukocytosis, no fevers, if worsens, will check CT abd to r/o  colitis   CAD -Currently no chest pain or acute shortness of breath, continue aspirin, statin, beta-blocker  -Mildly elevated troponin, will follow 2D echo   Essential tremor Chronic, continue beta-blocker  Essential hypertension -Continue beta-blocker  Hyperlipidemia -Continue statin  History of paroxysmal atrial fibrillation -Continue Lopressor for rate control and as BP allows -Not on anticoagulation due to high fall risk  History of pulmonary hypertension -Currently no acute issues  History of biliary dilatation, hepatic steatosis -LFTs appears to be stable, continue to monitor  Moderate protein calorie malnutrition with obesity -Albumin 2.9, dietitian consult Estimated body mass index is 31.05 kg/m as calculated from the following:   Height as of 08/06/19: 4\' 9"  (1.448 m).   Weight as of 06/22/19: 65.1 kg.  Code Status: Full code DVT Prophylaxis:  SCD's Family Communication: Discussed all imaging results, lab results, explained to the patient    Disposition Plan:     Status is: Observation  The patient will require care spanning > 2 midnights and should be moved to inpatient because: Inpatient level of care appropriate due to severity of illness  Dispo: The patient is from: Home  Anticipated d/c is to: TBD              Anticipated d/c date is: 2 days              Patient currently is not medically stable to d/c.      Time Spent in minutes   25 minutes  Procedures:  None  Consultants:   None  Antimicrobials:   Anti-infectives (From admission, onward)   None         Medications  Scheduled Meds: . aspirin  81 mg Oral Daily  . atorvastatin  10 mg Oral Daily  . DULoxetine  60 mg Oral Daily  . fluticasone furoate-vilanterol  1 puff Inhalation Daily  . folic acid  1 mg Oral Daily  . metoprolol tartrate  25 mg Oral BID  . pantoprazole  40 mg Oral BID  . pentoxifylline  400 mg Oral BID  . sodium chloride flush  3 mL Intravenous  Q12H   Continuous Infusions: PRN Meds:.acetaminophen **OR** acetaminophen, albuterol, ondansetron **OR** ondansetron (ZOFRAN) IV      Subjective:   Tammy [name] was seen and examined today.  Alert and oriented x2, to self and place, still confused. Patient denies dizziness, chest pain, shortness of breath, abdominal pain, N/V/D/C.  No focal weakness, no acute events overnight, no fevers.  Objective:   Vitals:   12/12/19 0751 12/12/19 0859 12/12/19 0900 12/12/19 0905  BP:   128/68 128/68  Pulse: 77 78  87  Resp: 18 18 17  (!) 24  Temp:      TempSrc:      SpO2: 97% (!) 73%  97%    Intake/Output Summary (Last 24 hours) at 12/12/2019 1110 Last data filed at 12/11/2019 1917 Gross per 24 hour  Intake 400 ml  Output --  Net 400 ml     Wt Readings from Last 3 Encounters:  06/22/19 65.1 kg  03/20/19 67.5 kg  04/17/18 68.1 kg     Exam  General: Alert and oriented x 2, to self and place, dry mucosal membranes  Cardiovascular: S1 S2 auscultated, no murmurs, RRR  Respiratory: Clear to auscultation bilaterally, no wheezing, rales or rhonchi  Gastrointestinal: Soft, nontender, nondistended, + bowel sounds  Ext: no pedal edema bilaterally  Neuro:  Strength 5/5 upper and lower extremities bilaterally  Musculoskeletal: No digital cyanosis, clubbing  Skin: No rashes  Psych: pleasant and cooperative   Data Reviewed:  I have personally reviewed following labs and imaging studies  Micro Results Recent Results (from the past 240 hour(s))  SARS Coronavirus 2 by RT PCR (hospital order, performed in [place] hospital lab) Nasopharyngeal Nasopharyngeal Swab     Status: None   Collection Time: 12/11/19  7:54 PM   Specimen: Nasopharyngeal Swab  Result Value Ref Range Status   SARS Coronavirus 2 NEGATIVE NEGATIVE Final    Comment: (NOTE) SARS-CoV-2 target nucleic acids are NOT DETECTED.  The SARS-CoV-2 RNA is generally detectable in upper and lower respiratory specimens  during the acute phase of infection. The lowest concentration of SARS-CoV-2 viral copies this assay can detect is 250 copies / mL. A negative result does not preclude SARS-CoV-2 infection and should not be used as the sole basis for treatment or other patient management decisions.  A negative result may occur with improper specimen collection / handling, submission of specimen other than nasopharyngeal swab, presence of viral mutation(s) within the areas targeted by this assay, and inadequate number of viral copies (<250 copies / mL). A  negative result must be combined with clinical observations, patient history, and epidemiological information.  Fact Sheet for Patients:   StrictlyIdeas.no  Fact Sheet for Healthcare Providers: BankingDealers.co.za  This test is not yet approved or  cleared by the [place] FDA and has been authorized for detection and/or diagnosis of SARS-CoV-2 by FDA under an Emergency Use Authorization (EUA).  This EUA will remain in effect (meaning this test can be used) for the duration of the COVID-19 declaration under Section 564(b)(1) of the Act, 21 U.S.C. section 360bbb-3(b)(1), unless the authorization is terminated or revoked sooner.  Performed at [facility], [place] [address], [place], [place] [address]     Radiology Reports DG Chest 1 View  Result Date: 12/11/2019 CLINICAL DATA:  Fall at home. EXAM: CHEST  1 VIEW COMPARISON:  Chest radiograph 02/22/2018 FINDINGS: Median sternotomy and CABG.The cardiomediastinal contours are normal. Upper normal heart size with aortic atherosclerosis. The lungs are clear. Pulmonary vasculature is normal. No consolidation, pleural effusion, or pneumothorax. No acute osseous abnormalities are seen. Bones appear under mineralized. IMPRESSION: 1. No acute chest findings. 2. Median sternotomy and CABG. Aortic Atherosclerosis (ICD10-I70.0). Electronically Signed    By: Keith Rake M.D.   On: 12/11/2019 21:07   DG Pelvis 1-2 Views  Result Date: 12/11/2019 CLINICAL DATA:  Fall at home. EXAM: PELVIS - 1-2 VIEW COMPARISON:  Pelvis radiograph 02/20/2016. FINDINGS: The cortical margins of the bony pelvis are intact. No fracture. Pubic symphysis and sacroiliac joints are congruent. Age related degenerative change of both hips and the sacroiliac joints. Both femoral heads are well-seated in the respective acetabula. Bones diffusely under mineralized. IMPRESSION: No acute pelvic fracture. Electronically Signed   By: Keith Rake M.D.   On: 12/11/2019 21:07   DG Tibia/Fibula Right  Result Date: 12/11/2019 CLINICAL DATA:  Fall at home with bruising to right lower leg. EXAM: RIGHT TIBIA AND FIBULA - 2 VIEW COMPARISON:  None. FINDINGS: Cortical margins of the tibia and fibular intact. There is no evidence of fracture or other focal bone lesions. Knee and ankle alignment are maintained. The bones are under mineralized. Multiple surgical clips in the medial soft tissues. Soft tissues are unremarkable. IMPRESSION: No fracture of the right lower leg. Electronically Signed   By: Keith Rake M.D.   On: 12/11/2019 21:03   CT Head Wo Contrast  Result Date: 12/11/2019 CLINICAL DATA:  Change in mental status EXAM: CT HEAD WITHOUT CONTRAST TECHNIQUE: Contiguous axial images were obtained from the base of the skull through the vertex without intravenous contrast. COMPARISON:  February 20, 2016 FINDINGS: Brain: No evidence of acute territorial infarction, hemorrhage, hydrocephalus,extra-axial collection or mass lesion/mass effect. There is dilatation the ventricles and sulci consistent with age-related atrophy. Low-attenuation changes in the deep white matter consistent with small vessel ischemia. Vascular: No hyperdense vessel or unexpected calcification. Skull: The skull is intact. No fracture or focal lesion identified. Sinuses/Orbits: The visualized paranasal sinuses and  mastoid air cells are clear. The orbits and globes intact. Other: None Cervical spine: Alignment: There is a grade 1 anterolisthesis of C3 on C4 measuring 3 mm. There is a minimal anterolisthesis of C4 on C5. Skull base and vertebrae: Visualized skull base is intact. No atlanto-occipital dissociation. The vertebral body heights are well maintained. No fracture or pathologic osseous lesion seen. Soft tissues and spinal canal: The visualized paraspinal soft tissues are unremarkable. No prevertebral soft tissue swelling is seen. The spinal canal is grossly unremarkable, no large epidural collection or significant canal  narrowing. Disc levels: Cervical spine spondylosis is seen with disc osteophyte complex and uncovertebral osteophytes most notable at C4-C5 with moderate neural foraminal narrowing and central canal stenosis. Upper chest: Mild centrilobular emphysematous changes seen at both lung apices. Scattered carotid artery calcifications are noted. Thoracic inlet is within normal limits. Other: None IMPRESSION: No acute intracranial abnormality. Findings consistent with age related atrophy and chronic small vessel ischemia No acute fracture or malalignment of the spine. Emphysema (ICD10-J43.9). Electronically Signed   By: Prudencio Pair M.D.   On: 12/11/2019 21:09   CT Cervical Spine Wo Contrast  Result Date: 12/11/2019 CLINICAL DATA:  Change in mental status EXAM: CT HEAD WITHOUT CONTRAST TECHNIQUE: Contiguous axial images were obtained from the base of the skull through the vertex without intravenous contrast. COMPARISON:  February 20, 2016 FINDINGS: Brain: No evidence of acute territorial infarction, hemorrhage, hydrocephalus,extra-axial collection or mass lesion/mass effect. There is dilatation the ventricles and sulci consistent with age-related atrophy. Low-attenuation changes in the deep white matter consistent with small vessel ischemia. Vascular: No hyperdense vessel or unexpected calcification. Skull: The  skull is intact. No fracture or focal lesion identified. Sinuses/Orbits: The visualized paranasal sinuses and mastoid air cells are clear. The orbits and globes intact. Other: None Cervical spine: Alignment: There is a grade 1 anterolisthesis of C3 on C4 measuring 3 mm. There is a minimal anterolisthesis of C4 on C5. Skull base and vertebrae: Visualized skull base is intact. No atlanto-occipital dissociation. The vertebral body heights are well maintained. No fracture or pathologic osseous lesion seen. Soft tissues and spinal canal: The visualized paraspinal soft tissues are unremarkable. No prevertebral soft tissue swelling is seen. The spinal canal is grossly unremarkable, no large epidural collection or significant canal narrowing. Disc levels: Cervical spine spondylosis is seen with disc osteophyte complex and uncovertebral osteophytes most notable at C4-C5 with moderate neural foraminal narrowing and central canal stenosis. Upper chest: Mild centrilobular emphysematous changes seen at both lung apices. Scattered carotid artery calcifications are noted. Thoracic inlet is within normal limits. Other: None IMPRESSION: No acute intracranial abnormality. Findings consistent with age related atrophy and chronic small vessel ischemia No acute fracture or malalignment of the spine. Emphysema (ICD10-J43.9). Electronically Signed   By: Prudencio Pair M.D.   On: 12/11/2019 21:09   US Abdomen Complete  Result Date: 11/26/2019 CLINICAL DATA:  Abdominal pain EXAM: ABDOMEN ULTRASOUND COMPLETE COMPARISON:  CT abdomen and pelvis December 08, 2016 FINDINGS: Gallbladder: Within the gallbladder, there are multiple echogenic foci which move and shadow consistent with cholelithiasis. Largest gallstone measures 1.6 cm in length. There is no appreciable gallbladder wall thickening or pericholecystic fluid. No sonographic Murphy sign noted by sonographer. Common bile duct: Diameter: 10 mm, prominent. No intrahepatic biliary duct  dilatation evident. Note that much of the common bile duct is obscured by gas. Liver: No focal lesion identified. Liver echogenicity overall is increased. Portal vein is patent on color Doppler imaging with normal direction of blood flow towards the liver. IVC: No abnormality visualized. Pancreas: No pancreatic mass or inflammatory focus. Spleen: Size and appearance within normal limits. Right Kidney: Length: 9.4 cm. Echogenicity within normal limits. No mass or hydronephrosis visualized. Left Kidney: Length: 10.7 cm. Echogenicity within normal limits. No mass or hydronephrosis visualized. Abdominal aorta: No aneurysm visualized. Other findings: No evident ascites. IMPRESSION: 1. Cholelithiasis. No gallbladder wall thickening or pericholecystic fluid. 2. Prominence of the proximal common bile duct. Much of the common bile duct obscured by gas. No appreciable intrahepatic biliary duct dilatation  seen currently. Note that there was biliary duct dilatation on prior CT. From an imaging standpoint, MRCP would be the optimum study of choice to further evaluate the biliary ductal system. 3. Diffuse increase in liver echogenicity, a finding indicative of hepatic steatosis. While no focal liver lesions are evident on this study by ultrasound, it must be cautioned that the sensitivity of ultrasound for detection of focal liver lesions is diminished in this circumstance. 4.  Study otherwise unremarkable. Electronically Signed   By: Lowella Grip III M.D.   On: 11/26/2019 13:57   DG Knee Complete 4 Views Left  Result Date: 12/11/2019 CLINICAL DATA:  Fall at home.  Bruising to left knee. EXAM: LEFT KNEE - COMPLETE 4+ VIEW COMPARISON:  None. FINDINGS: No evidence of fracture, dislocation, or joint effusion. Minor tricompartmental peripheral spurring most prominent in the patellofemoral compartment. Small quadriceps tendon enthesophyte. Bony under mineralization. Soft tissues are unremarkable. IMPRESSION: 1. No acute  fracture or subluxation of the left knee. 2. Mild tricompartmental osteoarthritis. Electronically Signed   By: Keith Rake M.D.   On: 12/11/2019 21:02    Lab Data:  CBC: Recent Labs  Lab 12/11/19 1929 12/12/19 0445  WBC 12.4* 12.0*  NEUTROABS 9.5* 9.4*  HGB 11.4* 11.3*  HCT 36.7 36.0  MCV 91.3 92.1  PLT 176 366   Basic Metabolic Panel: Recent Labs  Lab 12/11/19 1929 12/11/19 2238 12/12/19 0445  NA 142  --  142  K 4.6  --  3.7  CL 109  --  112*  CO2 20*  --  20*  GLUCOSE 109*  --  111*  BUN 26*  --  25*  CREATININE 0.82  --  0.81  CALCIUM 8.3*  --  8.2*  MG  --  2.3 2.2  PHOS  --  3.3 2.8   GFR: CrCl cannot be calculated (Unknown ideal weight.). Liver Function Tests: Recent Labs  Lab 12/11/19 1929 12/12/19 0445  AST 35 37  ALT 34 30  ALKPHOS 67 59  BILITOT 1.0 0.7  PROT 6.5 5.9*  ALBUMIN 3.4* 2.9*   No results for input(s): LIPASE, AMYLASE in the last 168 hours. Recent Labs  Lab 12/11/19 2238  AMMONIA 10   Coagulation Profile: No results for input(s): INR, PROTIME in the last 168 hours. Cardiac Enzymes: Recent Labs  Lab 12/11/19 1929  CKTOTAL 310*   BNP (last 3 results) No results for input(s): PROBNP in the last 8760 hours. HbA1C: No results for input(s): HGBA1C in the last 72 hours. CBG: No results for input(s): GLUCAP in the last 168 hours. Lipid Profile: No results for input(s): CHOL, HDL, LDLCALC, TRIG, CHOLHDL, LDLDIRECT in the last 72 hours. Thyroid Function Tests: Recent Labs    12/12/19 0234  TSH 0.473   Anemia Panel: No results for input(s): VITAMINB12, FOLATE, FERRITIN, TIBC, IRON, RETICCTPCT in the last 72 hours. Urine analysis:    Component Value Date/Time   COLORURINE YELLOW 12/11/2019 2130   APPEARANCEUR CLEAR 12/11/2019 2130   LABSPEC 1.025 12/11/2019 2130   PHURINE 5.5 12/11/2019 2130   GLUCOSEU NEGATIVE 12/11/2019 2130   HGBUR NEGATIVE 12/11/2019 2130   BILIRUBINUR SMALL (A) 12/11/2019 2130   KETONESUR 40  (A) 12/11/2019 2130   PROTEINUR NEGATIVE 12/11/2019 2130   UROBILINOGEN 0.2 07/11/2009 2220   NITRITE NEGATIVE 12/11/2019 2130   LEUKOCYTESUR TRACE (A) 12/11/2019 2130     Gianpaolo Mindel M.D. [facility] 12/12/2019, 11:10 AM   Call night coverage person covering after 7pm

## 2019-12-12 NOTE — Progress Notes (Signed)
  Echocardiogram 2D Echocardiogram has been performed.  Jennette Dubin 12/12/2019, 10:51 AM

## 2019-12-12 NOTE — Evaluation (Signed)
Occupational Therapy Evaluation Patient Details Name: Tammy [name] MRN: [id number] DOB: 11-Sep-1930 Today's Date: 12/12/2019    History of Present Illness Patient is a 86 year old female with history of dementia, hypertension, CAD, pulmonary hypertension, COPD, occasionally on O2, A. fib on anticoagulation, essential tremor presented from home, found by daughter on the floor.  She lives at home alone, has dementia, possibly had been on the floor for 2 to 3 days.  Family became concerned when she was not answering her phone, found her on the floor covered in feces, unable to get up, had abrasions on bilateral legs.  Patient was unsure how she fell.  Pt admitted with Acute metabolic encephalopathy superimposed on dementia and dehydration.   Clinical Impression   Tammy [name] is an 86 year old woman admitted to hospital after fall at home. On evaluation patient presents with generalized weakness, decreased activity tolerance and impaired balance resulting in decreased ability to safely perform ambulation and ADLs. Patient min assist for transfers and standing at edge of bed and required max assist for lower body dressing. Patient reports a history of falling, dementia and limited assistance from family. Patient agreeable to rehab but resistant to long term placement or ALF despite her history of falling and not being found for days. Patient not oriented to date or her own age. Patient will need short term rehab at discharge.     Follow Up Recommendations  SNF    Equipment Recommendations  None recommended by OT /Defer to next venue   Recommendations for Other Services       Precautions / Restrictions Precautions Precautions: Fall Restrictions Weight Bearing Restrictions: No      Mobility Bed Mobility Overal bed mobility: Needs Assistance Bed Mobility: Supine to Sit;Sit to Supine     Supine to sit: Min assist Sit to supine: Min assist   General bed mobility comments: increased  time and effort; min assist for hand hold to transfer to edge of stretcher.  Transfers Overall transfer level: Needs assistance Equipment used: Rolling walker (2 wheeled) Transfers: Sit to/from Stand Sit to Stand: Min assist         General transfer comment: Min assist for standing, hand hold for standing and steadying.    Balance Overall balance assessment: Needs assistance Sitting-balance support: No upper extremity supported;Feet unsupported Sitting balance-Leahy Scale: Good     Standing balance support: Single extremity supported Standing balance-Leahy Scale: Fair Standing balance comment: required use of UE (Unable to don mask in standing without LOB)                           ADL either performed or assessed with clinical judgement   ADL Overall ADL's : Needs assistance/impaired Eating/Feeding: Independent   Grooming: Set up;Wash/dry hands   Upper Body Bathing: Set up;Sitting   Lower Body Bathing: Minimal assistance;Set up;Sit to/from stand   Upper Body Dressing : Set up;Sitting   Lower Body Dressing: Set up;Sit to/from stand;Maximal assistance Lower Body Dressing Details (indicate cue type and reason): unable to donn socks from seated position. Reports she needs a foot stool or low stool to donn socks at home. Toilet Transfer: Stand-pivot;BSC;Minimal assistance   Toileting- Clothing Manipulation and Hygiene: Minimal assistance;Sit to/from stand       Functional mobility during ADLs: Minimal assistance General ADL Comments: min assist for steadying     Vision   Vision Assessment?: No apparent visual deficits     Perception  Praxis      Pertinent Vitals/Pain Pain Assessment: No/denies pain     Hand Dominance Right   Extremity/Trunk Assessment Upper Extremity Assessment Upper Extremity Assessment: RUE deficits/detail;LUE deficits/detail RUE Deficits / Details: Grossly 90 degrees shoulder ROM, elbow 5/5 LUE Deficits / Details: Less  than 90 degrees shoulder ROM, elbow 4/5,   Lower Extremity Assessment Lower Extremity Assessment: Defer to PT evaluation RLE Deficits / Details: ROM WFL: MMT 4/5 LLE Deficits / Details: ROM WFL: MMT 4/5   Cervical / Trunk Assessment Cervical / Trunk Assessment: Kyphotic   Communication Communication Communication: HOH   Cognition Arousal/Alertness: Awake/alert Behavior During Therapy: WFL for tasks assessed/performed Overall Cognitive Status: No family/caregiver present to determine baseline cognitive functioning Area of Impairment: Orientation;Safety/judgement                 Orientation Level: Disoriented to;Time     Following Commands: Follows one step commands consistently Safety/Judgement: Decreased awareness of deficits;Decreased awareness of safety   Problem Solving: Difficulty sequencing;Requires verbal cues;Requires tactile cues     General Comments  VSS    Exercises     Shoulder Instructions      Home Living Family/patient expects to be discharged to:: Private residence Living Arrangements: Alone Available Help at Discharge: Family;Available PRN/intermittently (Daughter works full time; had a neighbor that assisted but reports he recently passed away) Type of Home: House Home Access: Stairs to enter [employer] of Steps: 4 Entrance Stairs-Rails: [place]: One level     Bathroom Shower/Tub: Occupational psychologist: [place]: Environmental consultant - 4 wheels;Kasandra Knudsen - single point (reports walker is old)          Prior Functioning/Environment Level of Independence: Independent with assistive device(s)        Comments: Reports independent with ADLs and IADLs; denies falls in past 6 months other than this fall        OT Problem List: Decreased strength;Decreased range of motion;Decreased activity tolerance;Impaired balance (sitting and/or standing);Decreased cognition;Decreased safety awareness;Decreased  knowledge of use of DME or AE      OT Treatment/Interventions: Self-care/ADL training;DME and/or AE instruction;Therapeutic activities;Balance training;Patient/family education;Therapeutic exercise    OT Goals(Current goals can be found in the care plan section) Acute Rehab OT Goals Patient Stated Goal: to go home after rehab OT Goal Formulation: With patient Time For Goal Achievement: 12/26/19 Potential to Achieve Goals: Good  OT Frequency: Min 2X/week   Barriers to D/C: Decreased caregiver support          Co-evaluation              AM-PAC OT "6 Clicks" Daily Activity     Outcome Measure Help from another person eating meals?: None Help from another person taking care of personal grooming?: A Little Help from another person toileting, which includes using toliet, bedpan, or urinal?: A Little Help from another person bathing (including washing, rinsing, drying)?: A Little Help from another person to put on and taking off regular upper body clothing?: A Little Help from another person to put on and taking off regular lower body clothing?: A Lot 6 Click Score: 18   End of Session Equipment Utilized During Treatment: Gait belt Nurse Communication:  (okay to see per Nursing)  Activity Tolerance: Patient tolerated treatment well Patient left: with call bell/phone within reach  OT Visit Diagnosis: Muscle weakness (generalized) (M62.81);Unsteadiness on feet (R26.81);History of falling (Z91.81)  Time: 7034-0352 OT Time Calculation (min): 23 min Charges:  OT General Charges $OT Visit: 1 Visit OT Evaluation $OT Eval Low Complexity: 1 Low  Kanya Potteiger, OTR/L [place]  Office [phone] Pager: [place] 12/12/2019, 5:09 PM

## 2019-12-13 LAB — BASIC METABOLIC PANEL
Anion gap: 7 (ref 5–15)
BUN: 20 mg/dL (ref 8–23)
CO2: 21 mmol/L — ABNORMAL LOW (ref 22–32)
Calcium: 8.5 mg/dL — ABNORMAL LOW (ref 8.9–10.3)
Chloride: 111 mmol/L (ref 98–111)
Creatinine, Ser: 0.71 mg/dL (ref 0.44–1.00)
GFR calc Af Amer: >60 mL/min (ref >60)
GFR calc non Af Amer: >60 mL/min (ref >60)
Glucose, Bld: 112 mg/dL — ABNORMAL HIGH (ref 70–99)
Potassium: 3.8 mmol/L (ref 3.5–5.1)
Sodium: 139 mmol/L (ref 135–145)

## 2019-12-13 LAB — CBC
HCT: 34.7 % — ABNORMAL LOW (ref 36.0–46.0)
Hemoglobin: 11.1 g/dL — ABNORMAL LOW (ref 12.0–15.0)
MCH: 28.4 pg (ref 26.0–34.0)
MCHC: 32 g/dL (ref 30.0–36.0)
MCV: 88.7 fL (ref 80.0–100.0)
Platelets: 167 10*3/uL (ref 150–400)
RBC: 3.91 MIL/uL (ref 3.87–5.11)
RDW: 14.5 % (ref 11.5–15.5)
WBC: 10.5 10*3/uL (ref 4.0–10.5)
nRBC: 0 % (ref 0.0–0.2)

## 2019-12-13 MED ORDER — ADULT MULTIVITAMIN W/MINERALS CH
1.0000 | ORAL_TABLET | Freq: Every day | ORAL | Status: DC
  Administered 2019-12-13 – 2019-12-17 (×5): 1 via ORAL
  Filled 2019-12-13 (×4): qty 1

## 2019-12-13 NOTE — TOC Initial Note (Signed)
Transition of Care [facility]) - Initial/Assessment Note    Patient Details  Name: Tammy [name] MRN: [id number] Date of Birth: 1930-09-03  Transition of Care [facility]) CM/SW Contact:    [name], LCSW Phone Number: 12/13/2019, 10:23 AM  Clinical Narrative:  Patient seen in follow up to PT recommendation of SNF.  Ms Toohey states she lives alone here in [place], is open to going to rehab, and has been in same beofre, once in [place] and once in [place].  She is hoping for placement closer to home.  Stated she does not use walker at home, but has access to one.  Gave me permission to talk to her daughter for further discussion, planning.  Left message. Bed search initiated. TOC will continue to follow during the course of hospitalization.                  Expected Discharge Plan: Skilled Nursing Facility Barriers to Discharge: SNF Pending bed offer   Patient Goals and CMS Choice     Choice offered to / list presented to : Patient, Adult Children  Expected Discharge Plan and Services Expected Discharge Plan: [place] In-house Referral: Clinical Social Work Discharge Planning Services: CM Consult Post Acute Care Choice: [place] Living arrangements for the past 2 months: [place]                                      Prior Living Arrangements/Services Living arrangements for the past 2 months: Single Family Home Lives with:: Self Patient language and need for interpreter reviewed:: Yes        Need for Family Participation in Patient Care: Yes (Comment) Care giver support system in place?: Yes (comment) Current home services: DME Criminal Activity/Legal Involvement Pertinent to Current Situation/Hospitalization: No - Comment as needed  Activities of Daily Living Home Assistive Devices/Equipment: Cane (specify quad or straight), Walker (specify type), Other (Comment) (walk-in shower, 4 wheeled walker, single point cane) ADL  Screening (condition at time of admission) Patient's cognitive ability adequate to safely complete daily activities?: Yes Is the patient deaf or have difficulty hearing?: Yes ([place]) Does the patient have difficulty seeing, even when wearing glasses/contacts?: No Does the patient have difficulty concentrating, remembering, or making decisions?: Yes Patient able to express need for assistance with ADLs?: Yes Does the patient have difficulty dressing or bathing?: Yes Independently performs ADLs?: No Communication: Independent Dressing (OT): Needs assistance Is this a change from baseline?: Change from baseline, expected to last >3 days Grooming: Needs assistance Is this a change from baseline?: Change from baseline, expected to last >3 days Feeding: Needs assistance Is this a change from baseline?: Change from baseline, expected to last >3 days Bathing: Needs assistance Is this a change from baseline?: Change from baseline, expected to last >3 days Toileting: Needs assistance Is this a change from baseline?: Change from baseline, expected to last >3days In/Out Bed: Needs assistance Is this a change from baseline?: Change from baseline, expected to last >3 days Walks in Home: Needs assistance Is this a change from baseline?: Change from baseline, expected to last >3 days Does the patient have difficulty walking or climbing stairs?: Yes (secondary to weakness) Weakness of Legs: Both Weakness of Arms/Hands: None  Permission Sought/Granted Permission sought to share information with : Family Supports Permission granted to share information with : Yes, Verbal Permission Granted  Share Information with  NAME: Jodi Mourning, daughter        Permission granted to share info w Contact Information: [phone]  Emotional Assessment Appearance:: Appears stated age Attitude/Demeanor/Rapport: Engaged Affect (typically observed): Appropriate Orientation: : Oriented to Self, Oriented to Place,  Oriented to Situation Alcohol / Substance Use: Not Applicable Psych Involvement: No (comment)  Admission diagnosis:  Fall [W19.XXXA] Elevated troponin [R77.8] Acute encephalopathy [G93.40] Fall, initial encounter [W19.XXXA] Atrial fibrillation, unspecified type [facility]) [I48.91] Patient Active Problem List   Diagnosis Date Noted  . Elevated troponin 12/11/2019  . Essential tremor 03/16/2018  . Mild dementia ([place]) 11/08/2017  . Gait difficulty 11/08/2017  . Left groin pain 12/09/2016  . Acute respiratory failure with hypoxia ([place]) 12/08/2016  . Hypothermia 02/20/2016  . SIRS (systemic inflammatory response syndrome) ([place]) 02/19/2016  . Diverticulosis   . Encephalopathy   . Hyponatremia 02/14/2016  . Acute encephalopathy 02/14/2016  . Elevated lactic acid level 02/14/2016  . Dizziness 02/14/2016  . Abdominal pain 02/14/2016  . Symptomatic bradycardia 02/12/2016  . Leukocytosis 02/12/2016  . Depression 02/12/2016  . Renal insufficiency   . SOB (shortness of breath)   . Bradycardia on ECG 02/11/2016  . Atrial fibrillation with RVR ([place]) 01/01/2016  . PAF (paroxysmal atrial fibrillation) ([place]) 01/01/2016  . Alcohol dependence with withdrawal with complication ([place]) 62/70/3500  . Fall 01/01/2016  . AKI (acute kidney injury) ([place])   . Dehydration   . Exertional dyspnea 09/11/2015  . Memory loss 11/13/2013  . Abnormal involuntary movements(781.0) 11/13/2013  . Pulmonary hypertension ([place]) 11/02/2013  . Generalized weakness 11/02/2013  . Coronary artery disease involving native coronary artery of native heart with angina pectoris ([place]) 11/16/2012  . Mitral regurgitation 11/16/2012  . Renal artery stenosis ([place]) 11/16/2012  . Hyperlipidemia with target LDL less than 70 11/16/2012  . Essential hypertension 11/16/2012   PCP:  [name], MD Pharmacy:   [facility], [address] [place] [address] Phone:  [phone] Fax: [phone]     Social Determinants of Health (SDOH) Interventions    Readmission Risk Interventions No flowsheet data found.

## 2019-12-13 NOTE — NC FL2 (Signed)
[place] LEVEL OF CARE SCREENING TOOL     IDENTIFICATION  Patient Name: Tammy [name] Birthdate: [birth year] Sex: female Admission Date (Current Location): 12/11/2019  [facility] and [place] Number:  Herbalist and Address:  [facility] Saraia Platner [name],  [place] [address], [place]      Provider Number: [id number]  Attending Physician Name and Address:  Mendel Corning, MD  Relative Name and Phone Number:  Tammy [name], [id number],[phone]    Current Level of Care:   Recommended Level of Care:   Prior Approval Number:    Date Approved/Denied:   PASRR Number: [id number]  Discharge Plan: SNF    Current Diagnoses: Patient Active Problem List   Diagnosis Date Noted  . Elevated troponin 12/11/2019  . Essential tremor 03/16/2018  . Mild dementia ([place]) 11/08/2017  . Gait difficulty 11/08/2017  . Left groin pain 12/09/2016  . Acute respiratory failure with hypoxia ([place]) 12/08/2016  . Hypothermia 02/20/2016  . SIRS (systemic inflammatory response syndrome) ([place]) 02/19/2016  . Diverticulosis   . Encephalopathy   . Hyponatremia 02/14/2016  . Acute encephalopathy 02/14/2016  . Elevated lactic acid level 02/14/2016  . Dizziness 02/14/2016  . Abdominal pain 02/14/2016  . Symptomatic bradycardia 02/12/2016  . Leukocytosis 02/12/2016  . Depression 02/12/2016  . Renal insufficiency   . SOB (shortness of breath)   . Bradycardia on ECG 02/11/2016  . Atrial fibrillation with RVR ([place]) 01/01/2016  . PAF (paroxysmal atrial fibrillation) ([place]) 01/01/2016  . Alcohol dependence with withdrawal with complication ([place]) 40/81/4481  . Fall 01/01/2016  . AKI (acute kidney injury) ([place])   . Dehydration   . Exertional dyspnea 09/11/2015  . Memory loss 11/13/2013  . Abnormal involuntary movements(781.0) 11/13/2013  . Pulmonary hypertension ([place]) 11/02/2013  . Generalized weakness 11/02/2013  . Coronary artery disease involving native coronary artery of  native heart with angina pectoris ([place]) 11/16/2012  . Mitral regurgitation 11/16/2012  . Renal artery stenosis ([place]) 11/16/2012  . Hyperlipidemia with target LDL less than 70 11/16/2012  . Essential hypertension 11/16/2012    Orientation RESPIRATION BLADDER Height & Weight     Self, Situation, Place    External catheter Weight: 58.1 kg Height:  4\' 9"  (144.8 cm)  BEHAVIORAL SYMPTOMS/MOOD NEUROLOGICAL BOWEL NUTRITION STATUS   (none)  (none) Incontinent Diet (see d/c summary)  AMBULATORY STATUS COMMUNICATION OF NEEDS Skin   Extensive Assist Verbally Normal                       Personal Care Assistance Level of Assistance  Bathing, Feeding, Dressing Bathing Assistance: Maximum assistance Feeding assistance: Limited assistance Dressing Assistance: Maximum assistance     Functional Limitations Info  Sight, Hearing, Speech Sight Info: Adequate Hearing Info: Adequate Speech Info: Adequate    SPECIAL CARE FACTORS FREQUENCY  PT (By licensed PT), OT (By licensed OT)     PT Frequency: 5X/W OT Frequency: 5X/W            Contractures Contractures Info: Not present    Additional Factors Info  Code Status, Allergies Code Status Info: full Allergies Info: Naproxin, Sertraline, Penicillins, Lactose intolerent           Current Medications (12/13/2019):  This is the current hospital active medication list Current Facility-Administered Medications  Medication Dose Route Frequency Provider Last Rate Last Admin  . acetaminophen (TYLENOL) tablet 650 mg  650 mg Oral Q6H PRN Toy Baker, MD   650 mg at 12/12/19 2130  Or  . acetaminophen (TYLENOL) suppository 650 mg  650 mg Rectal Q6H PRN Doutova, Anastassia, MD      . albuterol (PROVENTIL) (2.5 MG/3ML) 0.083% nebulizer solution 2.5 mg  2.5 mg Nebulization Q4H PRN Rai, Ripudeep K, MD      . aspirin chewable tablet 81 mg  81 mg Oral Daily Doutova, Anastassia, MD   81 mg at 12/13/19 1008  . atorvastatin (LIPITOR) tablet  10 mg  10 mg Oral Daily Doutova, Anastassia, MD   10 mg at 12/13/19 1009  . DULoxetine (CYMBALTA) DR capsule 60 mg  60 mg Oral Daily Doutova, Anastassia, MD   60 mg at 12/13/19 1009  . fluticasone furoate-vilanterol (BREO ELLIPTA) 200-25 MCG/INH 1 puff  1 puff Inhalation Daily Doutova, Anastassia, MD   1 puff at 12/13/19 1018  . folic acid (FOLVITE) tablet 1 mg  1 mg Oral Daily Doutova, Anastassia, MD   1 mg at 12/13/19 1009  . metoprolol tartrate (LOPRESSOR) tablet 25 mg  25 mg Oral BID Toy Baker, MD   25 mg at 12/13/19 1009  . ondansetron (ZOFRAN) tablet 4 mg  4 mg Oral Q6H PRN Doutova, Anastassia, MD       Or  . ondansetron (ZOFRAN) injection 4 mg  4 mg Intravenous Q6H PRN Doutova, Anastassia, MD      . pantoprazole (PROTONIX) EC tablet 40 mg  40 mg Oral BID Doutova, Anastassia, MD   40 mg at 12/13/19 1009  . pentoxifylline (TRENTAL) CR tablet 400 mg  400 mg Oral BID Toy Baker, MD   400 mg at 12/13/19 1009  . sodium chloride flush (NS) 0.9 % injection 3 mL  3 mL Intravenous Q12H Toy Baker, MD   3 mL at 12/12/19 2131     Discharge Medications: Please see discharge summary for a list of discharge medications.  Relevant Imaging Results:  Relevant Lab Results:   Additional Information SSN [id number]  [name], [place]

## 2019-12-13 NOTE — Progress Notes (Addendum)
Initial Nutrition Assessment  DOCUMENTATION CODES:   Non-severe (moderate) malnutrition in context of chronic illness  INTERVENTION:   -Ensure Enlive from unit if desired -Multivitamin with minerals daily  NUTRITION DIAGNOSIS:   Moderate Malnutrition related to chronic illness (dementia) as evidenced by percent weight loss, moderate fat depletion, moderate muscle depletion.  GOAL:   Patient will meet greater than or equal to 90% of their needs  MONITOR:   PO intake, Supplement acceptance, Labs, Weight trends, I & O's  REASON FOR ASSESSMENT:   Consult Assessment of nutrition requirement/status  ASSESSMENT:   86 year old female with history of dementia, hypertension, CAD, pulmonary hypertension, COPD, occasionally on O2, A. fib on anticoagulation, essential tremor presented from home, found by daughter on the floor.  She lives at home alone, has dementia, possibly had been on the floor for 2 to 3 days.  Family became concerned when she was not answering her phone, found her on the floor covered in feces, unable to get up, had abrasions on bilateral legs.  Patient was unsure how she fell  Patient reports being confused about room change. Pt was able to provide some nutrition history but seemed fixated on her fall and reports not knowing why she fell. Pt states she eats well. States she doesn't have her partials so she needs her foods to be chopped but states she can cut up her foods herself. Per SLP note, recommends regular diet but pt encouraged to choose softer foods d/t poor dentition.   Pt declines supplements at this time as she says her appetite is good.   Per weight records, pt has lost 15 lbs since 2/5 (10% wt loss x 5.5 months, significant for time frame).   Labs reviewed. Medications: Folic acid  NUTRITION - FOCUSED PHYSICAL EXAM:    Most Recent Value  Orbital Region Mild depletion  Upper Arm Region Moderate depletion  Thoracic and Lumbar Region Unable to assess    Buccal Region Moderate depletion  Temple Region Moderate depletion  Clavicle Bone Region Moderate depletion  Clavicle and Acromion Bone Region Moderate depletion  Scapular Bone Region Moderate depletion  Dorsal Hand Moderate depletion  Patellar Region Unable to assess  Anterior Thigh Region Unable to assess  Posterior Calf Region Unable to assess  Edema (RD Assessment) None  Hair Reviewed  Eyes Reviewed  Mouth Reviewed  [missing teeth, states she doesn't have her partials]  Skin Reviewed  Nails Reviewed       Diet Order:   Diet Order            Diet Heart Room service appropriate? Yes; Fluid consistency: Thin  Diet effective now                 EDUCATION NEEDS:   No education needs have been identified at this time  Skin:  Skin Assessment: Reviewed RN Assessment  Last BM:  7/27  Height:   Ht Readings from Last 1 Encounters:  12/12/19 4\' 9"  (1.448 m)    Weight:   Wt Readings from Last 1 Encounters:  12/12/19 58.1 kg   BMI:  Body mass index is 27.7 kg/m.  Estimated Nutritional Needs:   Kcal:  1500-1700  Protein:  70-85g  Fluid:  1.7L/day   Clayton Bibles, MS, RD, LDN Inpatient Clinical Dietitian Contact information available via Amion

## 2019-12-13 NOTE — Progress Notes (Signed)
[facility]                                                                              Patient Demographics  Tammy [name], is a 86 y.o. female, DOB - [birth year], LOV:[id number]  Admit date - 12/11/2019   Admitting Physician Toy Baker, MD  Outpatient Primary MD for the patient is [name], MD  Outpatient specialists:   LOS - 1  days   Medical records reviewed and are as summarized below:    Chief Complaint  Patient presents with  . Fall       Brief summary   Patient is a 86 year old female with history of dementia, hypertension, CAD, pulmonary hypertension, COPD, occasionally on O2, A. fib on anticoagulation, essential tremor presented from home, found by daughter on the floor.  She lives at home alone, has dementia, possibly had been on the floor for 2 to 3 days.  Family became concerned when she was not answering her phone, found her on the floor covered in feces, unable to get up, had abrasions on bilateral legs.  Patient was unsure how she fell Has been vaccinated against Covid.  Covid test negative.  Assessment & Plan    Principal Problem:   Acute metabolic encephalopathy superimposed on dementia -Likely worsened due to dehydration, fall, rule out rhabdomyolysis -UA showed ketones however no nitrite, no bacteria, no hypercarbia.  Nonfocal neurological exam, cooperative.  Ammonia level 10. -CT head showed no acute intracranial abnormality, age-related atrophy and chronic small vessel ischemic changes - Much more alert and oriented today, wants to go home - continue rehydration, hold any sedating meds  Active problems  Fall: - possible syncopal episode vs mechanical fall  - PT evaluation recommended skilled nursing facility, lives alone, appears very deconditioned  - 2D echo showed EF of 60 to 65%, no regional wall motion abnormalities  Diarrhea, dehydration - cont gentle hydration, follow stool studies  -Leukocytosis improving,  no fevers   CAD -Currently no chest pain or acute shortness of breath, continue aspirin, statin, beta-blocker  -2D echo with normal EF, no regional wall motion abnormalities   Essential tremor Chronic, continue beta-blocker  Essential hypertension -Continue beta-blocker  Hyperlipidemia -Continue statin  History of paroxysmal atrial fibrillation -Continue Lopressor for rate control and as BP allows -Not on anticoagulation due to high fall risk  History of pulmonary hypertension -Currently no acute issues  History of biliary dilatation, hepatic steatosis -LFTs appears to be stable, continue to monitor  Moderate protein calorie malnutrition with obesity -Albumin 2.9, dietitian consult Estimated body mass index is 27.7 kg/m as calculated from the following:   Height as of this encounter: 4\' 9"  (1.448 m).   Weight as of this encounter: 58.1 kg.  Code Status: Full code DVT Prophylaxis:  SCD's Family Communication: Discussed all imaging results, lab results, explained to the patient and daughter on phone   Disposition Plan:     Status is: Inpatient  The patient will require care spanning > 2 midnights and should be moved to inpatient because: Inpatient level of care appropriate due to severity of illness  Dispo: The patient is from: Home  Anticipated d/c is to: SNF              Anticipated d/c date is: 2 days              Patient currently is not medically stable to d/c.      Time Spent in minutes   25 minutes  Procedures:  None  Consultants:   None  Antimicrobials:   Anti-infectives (From admission, onward)   None         Medications  Scheduled Meds: . aspirin  81 mg Oral Daily  . atorvastatin  10 mg Oral Daily  . DULoxetine  60 mg Oral Daily  . fluticasone furoate-vilanterol  1 puff Inhalation Daily  . folic acid  1 mg Oral Daily  . metoprolol tartrate  25 mg Oral BID  . multivitamin with minerals  1 tablet Oral Daily  .  pantoprazole  40 mg Oral BID  . pentoxifylline  400 mg Oral BID  . sodium chloride flush  3 mL Intravenous Q12H   Continuous Infusions: PRN Meds:.acetaminophen **OR** acetaminophen, albuterol, ondansetron **OR** ondansetron (ZOFRAN) IV      Subjective:   Tammy [name] was seen and examined today. No acute complaints, cheerful and pleasant, fairly alert and oriented, close to her baseline, confirmed with daughter. Patient denies dizziness, chest pain, shortness of breath, abdominal pain, N/V/D/C.    Objective:   Vitals:   12/13/19 0118 12/13/19 0538 12/13/19 0920 12/13/19 1300  BP: (!) 140/46 (!) 134/58 (!) 138/58 (!) 142/52  Pulse: 63 68 68 68  Resp: 17 18 17 19   Temp: 98.5 F (36.9 C) 97.7 F (36.5 C) 98.3 F (36.8 C) 98 F (36.7 C)  TempSrc: Oral Oral Oral Oral  SpO2: 99% 97% 98% 98%  Weight:      Height:        Intake/Output Summary (Last 24 hours) at 12/13/2019 1458 Last data filed at 12/13/2019 1000 Gross per 24 hour  Intake 410 ml  Output 600 ml  Net -190 ml     Wt Readings from Last 3 Encounters:  12/12/19 58.1 kg  06/22/19 65.1 kg  03/20/19 67.5 kg   Physical Exam  General: Alert and oriented x 3, NAD, close to her baseline,  Cardiovascular: S1 S2 clear, RRR. No pedal edema b/l  Respiratory: CTAB, no wheezing, rales or rhonchi  Gastrointestinal: Soft, nontender, nondistended, NBS  Ext: no pedal edema bilaterally  Neuro: no new deficits  Musculoskeletal: No cyanosis, clubbing  Skin: No rashes  Psych: Normal affect and demeanor, alert and oriented x3     Data Reviewed:  I have personally reviewed following labs and imaging studies  Micro Results Recent Results (from the past 240 hour(s))  SARS Coronavirus 2 by RT PCR (hospital order, performed in [place] hospital lab) Nasopharyngeal Nasopharyngeal Swab     Status: None   Collection Time: 12/11/19  7:54 PM   Specimen: Nasopharyngeal Swab  Result Value Ref Range Status   SARS  Coronavirus 2 NEGATIVE NEGATIVE Final    Comment: (NOTE) SARS-CoV-2 target nucleic acids are NOT DETECTED.  The SARS-CoV-2 RNA is generally detectable in upper and lower respiratory specimens during the acute phase of infection. The lowest concentration of SARS-CoV-2 viral copies this assay can detect is 250 copies / mL. A negative result does not preclude SARS-CoV-2 infection and should not be used as the sole basis for treatment or other patient management decisions.  A negative result may occur with improper specimen collection /  handling, submission of specimen other than nasopharyngeal swab, presence of viral mutation(s) within the areas targeted by this assay, and inadequate number of viral copies (<250 copies / mL). A negative result must be combined with clinical observations, patient history, and epidemiological information.  Fact Sheet for Patients:   StrictlyIdeas.no  Fact Sheet for Healthcare Providers: BankingDealers.co.za  This test is not yet approved or  cleared by the [place] FDA and has been authorized for detection and/or diagnosis of SARS-CoV-2 by FDA under an Emergency Use Authorization (EUA).  This EUA will remain in effect (meaning this test can be used) for the duration of the COVID-19 declaration under Section 564(b)(1) of the Act, 21 U.S.C. section 360bbb-3(b)(1), unless the authorization is terminated or revoked sooner.  Performed at [facility], [place] [address] [place], [place] [address]     Radiology Reports DG Chest 1 View  Result Date: 12/11/2019 CLINICAL DATA:  Fall at home. EXAM: CHEST  1 VIEW COMPARISON:  Chest radiograph 02/22/2018 FINDINGS: Median sternotomy and CABG.The cardiomediastinal contours are normal. Upper normal heart size with aortic atherosclerosis. The lungs are clear. Pulmonary vasculature is normal. No consolidation, pleural effusion, or pneumothorax. No  acute osseous abnormalities are seen. Bones appear under mineralized. IMPRESSION: 1. No acute chest findings. 2. Median sternotomy and CABG. Aortic Atherosclerosis (ICD10-I70.0). Electronically Signed   By: Keith Rake M.D.   On: 12/11/2019 21:07   DG Pelvis 1-2 Views  Result Date: 12/11/2019 CLINICAL DATA:  Fall at home. EXAM: PELVIS - 1-2 VIEW COMPARISON:  Pelvis radiograph 02/20/2016. FINDINGS: The cortical margins of the bony pelvis are intact. No fracture. Pubic symphysis and sacroiliac joints are congruent. Age related degenerative change of both hips and the sacroiliac joints. Both femoral heads are well-seated in the respective acetabula. Bones diffusely under mineralized. IMPRESSION: No acute pelvic fracture. Electronically Signed   By: Keith Rake M.D.   On: 12/11/2019 21:07   DG Tibia/Fibula Right  Result Date: 12/11/2019 CLINICAL DATA:  Fall at home with bruising to right lower leg. EXAM: RIGHT TIBIA AND FIBULA - 2 VIEW COMPARISON:  None. FINDINGS: Cortical margins of the tibia and fibular intact. There is no evidence of fracture or other focal bone lesions. Knee and ankle alignment are maintained. The bones are under mineralized. Multiple surgical clips in the medial soft tissues. Soft tissues are unremarkable. IMPRESSION: No fracture of the right lower leg. Electronically Signed   By: Keith Rake M.D.   On: 12/11/2019 21:03   CT Head Wo Contrast  Result Date: 12/11/2019 CLINICAL DATA:  Change in mental status EXAM: CT HEAD WITHOUT CONTRAST TECHNIQUE: Contiguous axial images were obtained from the base of the skull through the vertex without intravenous contrast. COMPARISON:  February 20, 2016 FINDINGS: Brain: No evidence of acute territorial infarction, hemorrhage, hydrocephalus,extra-axial collection or mass lesion/mass effect. There is dilatation the ventricles and sulci consistent with age-related atrophy. Low-attenuation changes in the deep white matter consistent with  small vessel ischemia. Vascular: No hyperdense vessel or unexpected calcification. Skull: The skull is intact. No fracture or focal lesion identified. Sinuses/Orbits: The visualized paranasal sinuses and mastoid air cells are clear. The orbits and globes intact. Other: None Cervical spine: Alignment: There is a grade 1 anterolisthesis of C3 on C4 measuring 3 mm. There is a minimal anterolisthesis of C4 on C5. Skull base and vertebrae: Visualized skull base is intact. No atlanto-occipital dissociation. The vertebral body heights are well maintained. No fracture or pathologic osseous lesion seen. Soft tissues  and spinal canal: The visualized paraspinal soft tissues are unremarkable. No prevertebral soft tissue swelling is seen. The spinal canal is grossly unremarkable, no large epidural collection or significant canal narrowing. Disc levels: Cervical spine spondylosis is seen with disc osteophyte complex and uncovertebral osteophytes most notable at C4-C5 with moderate neural foraminal narrowing and central canal stenosis. Upper chest: Mild centrilobular emphysematous changes seen at both lung apices. Scattered carotid artery calcifications are noted. Thoracic inlet is within normal limits. Other: None IMPRESSION: No acute intracranial abnormality. Findings consistent with age related atrophy and chronic small vessel ischemia No acute fracture or malalignment of the spine. Emphysema (ICD10-J43.9). Electronically Signed   By: Prudencio Pair M.D.   On: 12/11/2019 21:09   CT Cervical Spine Wo Contrast  Result Date: 12/11/2019 CLINICAL DATA:  Change in mental status EXAM: CT HEAD WITHOUT CONTRAST TECHNIQUE: Contiguous axial images were obtained from the base of the skull through the vertex without intravenous contrast. COMPARISON:  February 20, 2016 FINDINGS: Brain: No evidence of acute territorial infarction, hemorrhage, hydrocephalus,extra-axial collection or mass lesion/mass effect. There is dilatation the ventricles  and sulci consistent with age-related atrophy. Low-attenuation changes in the deep white matter consistent with small vessel ischemia. Vascular: No hyperdense vessel or unexpected calcification. Skull: The skull is intact. No fracture or focal lesion identified. Sinuses/Orbits: The visualized paranasal sinuses and mastoid air cells are clear. The orbits and globes intact. Other: None Cervical spine: Alignment: There is a grade 1 anterolisthesis of C3 on C4 measuring 3 mm. There is a minimal anterolisthesis of C4 on C5. Skull base and vertebrae: Visualized skull base is intact. No atlanto-occipital dissociation. The vertebral body heights are well maintained. No fracture or pathologic osseous lesion seen. Soft tissues and spinal canal: The visualized paraspinal soft tissues are unremarkable. No prevertebral soft tissue swelling is seen. The spinal canal is grossly unremarkable, no large epidural collection or significant canal narrowing. Disc levels: Cervical spine spondylosis is seen with disc osteophyte complex and uncovertebral osteophytes most notable at C4-C5 with moderate neural foraminal narrowing and central canal stenosis. Upper chest: Mild centrilobular emphysematous changes seen at both lung apices. Scattered carotid artery calcifications are noted. Thoracic inlet is within normal limits. Other: None IMPRESSION: No acute intracranial abnormality. Findings consistent with age related atrophy and chronic small vessel ischemia No acute fracture or malalignment of the spine. Emphysema (ICD10-J43.9). Electronically Signed   By: Prudencio Pair M.D.   On: 12/11/2019 21:09   US Abdomen Complete  Result Date: 11/26/2019 CLINICAL DATA:  Abdominal pain EXAM: ABDOMEN ULTRASOUND COMPLETE COMPARISON:  CT abdomen and pelvis December 08, 2016 FINDINGS: Gallbladder: Within the gallbladder, there are multiple echogenic foci which move and shadow consistent with cholelithiasis. Largest gallstone measures 1.6 cm in length.  There is no appreciable gallbladder wall thickening or pericholecystic fluid. No sonographic Murphy sign noted by sonographer. Common bile duct: Diameter: 10 mm, prominent. No intrahepatic biliary duct dilatation evident. Note that much of the common bile duct is obscured by gas. Liver: No focal lesion identified. Liver echogenicity overall is increased. Portal vein is patent on color Doppler imaging with normal direction of blood flow towards the liver. IVC: No abnormality visualized. Pancreas: No pancreatic mass or inflammatory focus. Spleen: Size and appearance within normal limits. Right Kidney: Length: 9.4 cm. Echogenicity within normal limits. No mass or hydronephrosis visualized. Left Kidney: Length: 10.7 cm. Echogenicity within normal limits. No mass or hydronephrosis visualized. Abdominal aorta: No aneurysm visualized. Other findings: No evident ascites. IMPRESSION: 1. Cholelithiasis.  No gallbladder wall thickening or pericholecystic fluid. 2. Prominence of the proximal common bile duct. Much of the common bile duct obscured by gas. No appreciable intrahepatic biliary duct dilatation seen currently. Note that there was biliary duct dilatation on prior CT. From an imaging standpoint, MRCP would be the optimum study of choice to further evaluate the biliary ductal system. 3. Diffuse increase in liver echogenicity, a finding indicative of hepatic steatosis. While no focal liver lesions are evident on this study by ultrasound, it must be cautioned that the sensitivity of ultrasound for detection of focal liver lesions is diminished in this circumstance. 4.  Study otherwise unremarkable. Electronically Signed   By: Lowella Grip III M.D.   On: 11/26/2019 13:57   DG Knee Complete 4 Views Left  Result Date: 12/11/2019 CLINICAL DATA:  Fall at home.  Bruising to left knee. EXAM: LEFT KNEE - COMPLETE 4+ VIEW COMPARISON:  None. FINDINGS: No evidence of fracture, dislocation, or joint effusion. Minor  tricompartmental peripheral spurring most prominent in the patellofemoral compartment. Small quadriceps tendon enthesophyte. Bony under mineralization. Soft tissues are unremarkable. IMPRESSION: 1. No acute fracture or subluxation of the left knee. 2. Mild tricompartmental osteoarthritis. Electronically Signed   By: Keith Rake M.D.   On: 12/11/2019 21:02   ECHOCARDIOGRAM COMPLETE  Result Date: 12/12/2019    ECHOCARDIOGRAM REPORT   Patient Name:   MEILYN HEINDL Date of Exam: 12/12/2019 Medical Rec #:  [id number]     Height:       57.0 in Accession #:    [id number]    Weight:       143.5 lb Date of Birth:  10-23-1930     BSA:          1.562 m Patient Age:    51 years      BP:           128/68 mmHg Patient Gender: F             HR:           87 bpm. Exam Location:  Inpatient Procedure: 2D Echo Indications:    syncope R55  History:        Patient has prior history of Echocardiogram examinations, most                 recent 07/19/2019. CAD, Prior CABG; Risk Factors:Hypertension and                 Dyslipidemia.  Sonographer:    Mikki Santee RDCS (AE) Referring Phys: [place]  1. Small intracavitary gradient noted. Peak velocity 0.98 m/s. Peak gradient 3.8 mmHg. Left ventricular ejection fraction, by estimation, is 60 to 65%. The left ventricle has normal function. The left ventricle has no regional wall motion abnormalities.  There is mild concentric left ventricular hypertrophy. Left ventricular diastolic parameters are consistent with Grade I diastolic dysfunction (impaired relaxation). Elevated left ventricular end-diastolic pressure.  2. Right ventricular systolic function is normal. The right ventricular size is normal. There is normal pulmonary artery systolic pressure.  3. The mitral valve is normal in structure. No evidence of mitral valve regurgitation. No evidence of mitral stenosis.  4. The aortic valve is tricuspid. Aortic valve regurgitation is not visualized. No aortic  stenosis is present.  5. The inferior vena cava is normal in size with greater than 50% respiratory variability, suggesting right atrial pressure of 3 mmHg. FINDINGS  Left Ventricle: Small intracavitary gradient noted. Peak velocity 0.98 m/s.  Peak gradient 3.8 mmHg. Left ventricular ejection fraction, by estimation, is 60 to 65%. The left ventricle has normal function. The left ventricle has no regional wall motion abnormalities. The left ventricular internal cavity size was normal in size. There is mild concentric left ventricular hypertrophy. Left ventricular diastolic parameters are consistent with Grade I diastolic dysfunction (impaired relaxation). Elevated left ventricular end-diastolic pressure. Right Ventricle: The right ventricular size is normal. No increase in right ventricular wall thickness. Right ventricular systolic function is normal. There is normal pulmonary artery systolic pressure. The tricuspid regurgitant velocity is 2.76 m/s, and  with an assumed right atrial pressure of 3 mmHg, the estimated right ventricular systolic pressure is 97.9 mmHg. Left Atrium: Left atrial size was normal in size. Right Atrium: Right atrial size was normal in size. Pericardium: There is no evidence of pericardial effusion. Mitral Valve: The mitral valve is normal in structure. Normal mobility of the mitral valve leaflets. No evidence of mitral valve regurgitation. No evidence of mitral valve stenosis. Tricuspid Valve: The tricuspid valve is normal in structure. Tricuspid valve regurgitation is trivial. No evidence of tricuspid stenosis. Aortic Valve: The aortic valve is tricuspid. Aortic valve regurgitation is not visualized. No aortic stenosis is present. Pulmonic Valve: The pulmonic valve was normal in structure. Pulmonic valve regurgitation is not visualized. No evidence of pulmonic stenosis. Aorta: The aortic root is normal in size and structure. Venous: The inferior vena cava is normal in size with greater than  50% respiratory variability, suggesting right atrial pressure of 3 mmHg. IAS/Shunts: No atrial level shunt detected by color flow Doppler.  LEFT VENTRICLE PLAX 2D LVIDd:         3.61 cm  Diastology LVIDs:         2.45 cm  LV e' lateral:   5.40 cm/s LV PW:         1.19 cm  LV E/e' lateral: 14.2 LV IVS:        1.18 cm  LV e' medial:    4.65 cm/s LVOT diam:     2.00 cm  LV E/e' medial:  16.5 LV SV:         75 LV SV Index:   48 LVOT Area:     3.14 cm  RIGHT VENTRICLE RV S prime:     12.60 cm/s TAPSE (M-mode): 1.4 cm LEFT ATRIUM             Index       RIGHT ATRIUM           Index LA diam:        3.20 cm 2.05 cm/m  RA Area:     14.20 cm LA Vol (A2C):   39.1 ml 25.03 ml/m RA Volume:   31.20 ml  19.97 ml/m LA Vol (A4C):   40.7 ml 26.06 ml/m LA Biplane Vol: 41.5 ml 26.57 ml/m  AORTIC VALVE LVOT Vmax:   100.00 cm/s LVOT Vmean:  68.500 cm/s LVOT VTI:    0.238 m  AORTA Ao Root diam: 2.60 cm MITRAL VALVE                TRICUSPID VALVE MV Area (PHT): 2.60 cm     TR Peak grad:   30.5 mmHg MV Decel Time: 292 msec     TR Vmax:        276.00 cm/s MV E velocity: 76.50 cm/s MV A velocity: 119.00 cm/s  SHUNTS MV E/A ratio:  0.64  Systemic VTI:  0.24 m                             Systemic Diam: 2.00 cm Skeet Latch MD Electronically signed by Skeet Latch MD Signature Date/Time: 12/12/2019/4:32:19 PM    Final    VAS US CAROTID  Result Date: 12/12/2019 Carotid Arterial Duplex Study Indications:       Syncope. Risk Factors:      Hypertension, hyperlipidemia. Limitations        Today's exam was limited due to heavy calcification and the                    resulting shadowing and the patient's respiratory variation. Comparison Study:  No prior studies. Performing Technologist: Oliver Hum RVT  Examination Guidelines: A complete evaluation includes B-mode imaging, spectral Doppler, color Doppler, and power Doppler as needed of all accessible portions of each vessel. Bilateral testing is considered an integral  part of a complete examination. Limited examinations for reoccurring indications may be performed as noted.  Right Carotid Findings: +----------+--------+--------+--------+--------------------------+--------+           PSV cm/sEDV cm/sStenosisPlaque Description        Comments +----------+--------+--------+--------+--------------------------+--------+ CCA Prox  104     9               smooth and heterogenous            +----------+--------+--------+--------+--------------------------+--------+ CCA Distal59      9               irregular and heterogenous         +----------+--------+--------+--------+--------------------------+--------+ ICA Prox  97      16              calcific                  tortuous +----------+--------+--------+--------+--------------------------+--------+ ICA Distal80      13                                        tortuous +----------+--------+--------+--------+--------------------------+--------+ ECA       112                                                        +----------+--------+--------+--------+--------------------------+--------+ +----------+--------+-------+--------+-------------------+           PSV cm/sEDV cmsDescribeArm Pressure (mmHG) +----------+--------+-------+--------+-------------------+ [id number]                                        +----------+--------+-------+--------+-------------------+ +---------+--------+--+--------+-+---------+ VertebralPSV cm/s49EDV cm/s8Antegrade +---------+--------+--+--------+-+---------+  Left Carotid Findings: +----------+--------+--------+--------+-----------------------+--------+           PSV cm/sEDV cm/sStenosisPlaque Description     Comments +----------+--------+--------+--------+-----------------------+--------+ CCA Prox  100     11              smooth and heterogenous         +----------+--------+--------+--------+-----------------------+--------+ CCA  Distal70      12              smooth and heterogenous         +----------+--------+--------+--------+-----------------------+--------+ ICA  Prox  110     24              calcific                        +----------+--------+--------+--------+-----------------------+--------+ ICA Distal131     25                                     tortuous +----------+--------+--------+--------+-----------------------+--------+ ECA       201     10                                              +----------+--------+--------+--------+-----------------------+--------+ +----------+--------+--------+--------+-------------------+           PSV cm/sEDV cm/sDescribeArm Pressure (mmHG) +----------+--------+--------+--------+-------------------+ [id number]                                         +----------+--------+--------+--------+-------------------+ +---------+--------+--+--------+-+---------+ VertebralPSV cm/s52EDV cm/s9Antegrade +---------+--------+--+--------+-+---------+   Summary: Right Carotid: Velocities in the right ICA are consistent with a 1-39% stenosis. Left Carotid: Velocities in the left ICA are consistent with a 1-39% stenosis. Vertebrals: Bilateral vertebral arteries demonstrate antegrade flow. *See table(s) above for measurements and observations.  Electronically signed by Antony Contras MD on 12/12/2019 at 4:32:16 PM.    Final     Lab Data:  CBC: Recent Labs  Lab 12/11/19 1929 12/12/19 0445 12/13/19 0319  WBC 12.4* 12.0* 10.5  NEUTROABS 9.5* 9.4*  --   HGB 11.4* 11.3* 11.1*  HCT 36.7 36.0 34.7*  MCV 91.3 92.1 88.7  PLT 176 176 557   Basic Metabolic Panel: Recent Labs  Lab 12/11/19 1929 12/11/19 2238 12/12/19 0445 12/13/19 0319  NA 142  --  142 139  K 4.6  --  3.7 3.8  CL 109  --  112* 111  CO2 20*  --  20* 21*  GLUCOSE 109*  --  111* 112*  BUN 26*  --  25* 20  CREATININE 0.82  --  0.81 0.71  CALCIUM 8.3*  --  8.2* 8.5*  MG  --  2.3 2.2  --     PHOS  --  3.3 2.8  --    GFR: Estimated Creatinine Clearance: 36.3 mL/min (by C-G formula based on SCr of 0.71 mg/dL). Liver Function Tests: Recent Labs  Lab 12/11/19 1929 12/12/19 0445  AST 35 37  ALT 34 30  ALKPHOS 67 59  BILITOT 1.0 0.7  PROT 6.5 5.9*  ALBUMIN 3.4* 2.9*   No results for input(s): LIPASE, AMYLASE in the last 168 hours. Recent Labs  Lab 12/11/19 2238  AMMONIA 10   Coagulation Profile: No results for input(s): INR, PROTIME in the last 168 hours. Cardiac Enzymes: Recent Labs  Lab 12/11/19 1929 12/12/19 1701  CKTOTAL 310* 178   BNP (last 3 results) No results for input(s): PROBNP in the last 8760 hours. HbA1C: No results for input(s): HGBA1C in the last 72 hours. CBG: No results for input(s): GLUCAP in the last 168 hours. Lipid Profile: No results for input(s): CHOL, HDL, LDLCALC, TRIG, CHOLHDL, LDLDIRECT in the last 72 hours. Thyroid Function Tests: Recent Labs    12/12/19 0234  TSH 0.473   Anemia  Panel: No results for input(s): VITAMINB12, FOLATE, FERRITIN, TIBC, IRON, RETICCTPCT in the last 72 hours. Urine analysis:    Component Value Date/Time   COLORURINE YELLOW 12/11/2019 2130   APPEARANCEUR CLEAR 12/11/2019 2130   LABSPEC 1.025 12/11/2019 2130   PHURINE 5.5 12/11/2019 2130   GLUCOSEU NEGATIVE 12/11/2019 2130   HGBUR NEGATIVE 12/11/2019 2130   BILIRUBINUR SMALL (A) 12/11/2019 2130   KETONESUR 40 (A) 12/11/2019 2130   PROTEINUR NEGATIVE 12/11/2019 2130   UROBILINOGEN 0.2 07/11/2009 2220   NITRITE NEGATIVE 12/11/2019 2130   LEUKOCYTESUR TRACE (A) 12/11/2019 2130     Kelaiah Escalona M.D. [facility] 12/13/2019, 2:58 PM   Call night coverage person covering after 7pm

## 2019-12-14 DIAGNOSIS — E440 Moderate protein-calorie malnutrition: Secondary | ICD-10-CM | POA: Insufficient documentation

## 2019-12-14 LAB — CBC
HCT: 34.9 % — ABNORMAL LOW (ref 36.0–46.0)
Hemoglobin: 11.1 g/dL — ABNORMAL LOW (ref 12.0–15.0)
MCH: 28.6 pg (ref 26.0–34.0)
MCHC: 31.8 g/dL (ref 30.0–36.0)
MCV: 89.9 fL (ref 80.0–100.0)
Platelets: 188 10*3/uL (ref 150–400)
RBC: 3.88 MIL/uL (ref 3.87–5.11)
RDW: 14.6 % (ref 11.5–15.5)
WBC: 9.3 10*3/uL (ref 4.0–10.5)
nRBC: 0 % (ref 0.0–0.2)

## 2019-12-14 LAB — BASIC METABOLIC PANEL
Anion gap: 8 (ref 5–15)
BUN: 20 mg/dL (ref 8–23)
CO2: 23 mmol/L (ref 22–32)
Calcium: 8.4 mg/dL — ABNORMAL LOW (ref 8.9–10.3)
Chloride: 109 mmol/L (ref 98–111)
Creatinine, Ser: 0.82 mg/dL (ref 0.44–1.00)
GFR calc Af Amer: >60 mL/min (ref >60)
GFR calc non Af Amer: >60 mL/min (ref >60)
Glucose, Bld: 104 mg/dL — ABNORMAL HIGH (ref 70–99)
Potassium: 3.9 mmol/L (ref 3.5–5.1)
Sodium: 140 mmol/L (ref 135–145)

## 2019-12-14 NOTE — Progress Notes (Signed)
Physical Therapy Treatment Patient Details Name: Tammy [name] MRN: [id number] DOB: September 08, 1930 Today's Date: 12/14/2019    History of Present Illness Patient is a 86 year old female with history of dementia, hypertension, CAD, pulmonary hypertension, COPD, occasionally on O2, A. fib on anticoagulation, essential tremor presented from home, found by daughter on the floor.  She lives at home alone, has dementia, possibly had been on the floor for 2 to 3 days.  Family became concerned when she was not answering her phone, found her on the floor covered in feces, unable to get up, had abrasions on bilateral legs.  Patient was unsure how she fell.  Pt admitted with Acute metabolic encephalopathy superimposed on dementia and dehydration.    PT Comments    Pt in low bed with B mats on floor.  Assisted OOB to amb to bathroom.  VERY unsteady gaiy using her small base quad cane.  Required assist with toilet transfer as well.  Near fall when standing at toilet to pull up her under pants.  Therapist "hands on" assist to steady.  Static standing at sink to wash hands required assist to steady.  Amb to recliner with noted 2/4 dyspnea and increased c/o fatigue.  RA 96% and HR 74.  Pt is deconditioned Pt lives home alone and will need ST Rehab at SNF prior to safely retuning. .   Follow Up Recommendations  SNF;Supervision/Assistance - 24 hour     Equipment Recommendations       Recommendations for Other Services       Precautions / Restrictions Precautions Precautions: Fall Restrictions Weight Bearing Restrictions: No    Mobility  Bed Mobility Overal bed mobility: Needs Assistance Bed Mobility: Supine to Sit     Supine to sit: Min guard;Min assist     General bed mobility comments: increased eggort and time to complete  Transfers Overall transfer level: Needs assistance Equipment used: Quad cane Transfers: Sit to/from [employer] to Stand: Min assist Stand pivot  transfers: Min assist;Mod assist       General transfer comment: VERY unsteady with OOB and toilet tranfer.  Required 75% VC's on safety with turns and hand placement.  Ambulation/Gait Ambulation/Gait assistance: Mod assist Gait Distance (Feet): 22 Feet Assistive device: Quad cane;1 person hand held assist Gait Pattern/deviations: Step-to pattern;Staggering left;Staggering right Gait velocity: decreased   General Gait Details: VERY unsteady gait using her small base quad cane.  Staggering R and L.  Only amb to and frim bathroom due to c/o fatigue and noted 2.4 dyspnea.  HIGH FALL RISK.   Stairs             Wheelchair Mobility    Modified Rankin (Stroke Patients Only)       Balance                                            Cognition Arousal/Alertness: Awake/alert Behavior During Therapy: WFL for tasks assessed/performed Overall Cognitive Status: Within Functional Limits for tasks assessed                                 General Comments: AxO x 3 pleasant with Hx dementia      Exercises      General Comments        Pertinent Vitals/Pain Pain Assessment: No/denies pain  Home Living                      Prior Function            PT Goals (current goals can now be found in the care plan section) Progress towards PT goals: Progressing toward goals    Frequency    Min 3X/week      PT Plan Current plan remains appropriate    Co-evaluation              AM-PAC PT "6 Clicks" Mobility   Outcome Measure  Help needed turning from your back to your side while in a flat bed without using bedrails?: A Little Help needed moving from lying on your back to sitting on the side of a flat bed without using bedrails?: A Little Help needed moving to and from a bed to a chair (including a wheelchair)?: A Lot Help needed standing up from a chair using your arms (e.g., wheelchair or bedside chair)?: A Lot Help  needed to walk in hospital room?: A Lot Help needed climbing 3-5 steps with a railing? : Total 6 Click Score: 13    End of Session Equipment Utilized During Treatment: Gait belt Activity Tolerance: Patient limited by fatigue Patient left: in chair;with call bell/phone within reach;with chair alarm set Nurse Communication: Mobility status (pt had a BM) PT Visit Diagnosis: Other abnormalities of gait and mobility (R26.89);History of falling (Z91.81);Muscle weakness (generalized) (M62.81)     Time: 3833-3832 PT Time Calculation (min) (ACUTE ONLY): 28 min  Charges:  $Gait Training: 8-22 mins $Therapeutic Activity: 8-22 mins                     Tammy [name]  PTA Acute  Rehabilitation Services Pager      [phone] Office      [phone]

## 2019-12-14 NOTE — Progress Notes (Signed)
[facility]                                                                              Patient Demographics  Tammy [name], is a 86 y.o. female, DOB - March 28, 1931, GMW:[id number]  Admit date - 12/11/2019   Admitting Physician Toy Baker, MD  Outpatient Primary MD for the patient is [name], MD  Outpatient specialists:   LOS - 2  days   Medical records reviewed and are as summarized below:    Chief Complaint  Patient presents with  . Fall       Brief summary   Patient is a 86 year old female with history of dementia, hypertension, CAD, pulmonary hypertension, COPD, occasionally on O2, A. fib on anticoagulation, essential tremor presented from home, found by daughter on the floor.  She lives at home alone, has dementia, possibly had been on the floor for 2 to 3 days.  Family became concerned when she was not answering her phone, found her on the floor covered in feces, unable to get up, had abrasions on bilateral legs.  Patient was unsure how she fell Has been vaccinated against Covid.  Covid test negative.  Assessment & Plan    Principal Problem:   Acute metabolic encephalopathy superimposed on dementia -Likely worsened due to dehydration, fall, rule out rhabdomyolysis -UA showed ketones however no nitrite, no bacteria, no hypercarbia.  Nonfocal neurological exam, cooperative.  Ammonia level 10. -CT head showed no acute intracranial abnormality, age-related atrophy and chronic small vessel ischemic changes -Alert and oriented,, appears to be close to her baseline no acute issues overnight awaiting SNF  Active problems  Fall: - possible syncopal episode vs mechanical fall  - PT evaluation recommended skilled nursing facility, lives alone, appears very deconditioned  - 2D echo showed EF of 60 to 65%, no regional wall motion abnormalities -Awaiting skilled nursing facility, no acute complaints  Diarrhea, dehydration -No further diarrhea,  leukocytosis resolved   CAD -Currently no chest pain or acute shortness of breath, continue aspirin, statin, beta-blocker  -2D echo with normal EF, no regional wall motion abnormalities   Essential tremor Chronic, continue beta-blocker  Essential hypertension -Continue beta-blocker  Hyperlipidemia -Continue statin  History of paroxysmal atrial fibrillation -Continue Lopressor for rate control and as BP allows -Not on anticoagulation due to high fall risk  History of pulmonary hypertension -Currently no acute issues  History of biliary dilatation, hepatic steatosis -No acute issues  Moderate protein calorie malnutrition with obesity -Albumin 2.9, dietitian consult Estimated body mass index is 27.7 kg/m as calculated from the following:   Height as of this encounter: 4\' 9"  (1.448 m).   Weight as of this encounter: 58.1 kg.  Code Status: Full code DVT Prophylaxis:  SCD's Family Communication: Discussed all imaging results, lab results, explained to the patient and daughter on phone on 7/29   Disposition Plan:     Status is: Inpatient  The patient will require care spanning > 2 midnights and should be moved to inpatient because: Inpatient level of care appropriate due to severity of illness  Dispo: The patient is from: Home  Anticipated d/c is to: SNF              Anticipated d/c date is: 2 days              Patient currently is medically stable to d/c.  Awaiting skilled nursing facility      Time Spent in minutes   25 minutes  Procedures:  None  Consultants:   None  Antimicrobials:   Anti-infectives (From admission, onward)   None         Medications  Scheduled Meds: . aspirin  81 mg Oral Daily  . atorvastatin  10 mg Oral Daily  . DULoxetine  60 mg Oral Daily  . fluticasone furoate-vilanterol  1 puff Inhalation Daily  . folic acid  1 mg Oral Daily  . metoprolol tartrate  25 mg Oral BID  . multivitamin with minerals  1 tablet  Oral Daily  . pantoprazole  40 mg Oral BID  . pentoxifylline  400 mg Oral BID  . sodium chloride flush  3 mL Intravenous Q12H   Continuous Infusions: PRN Meds:.acetaminophen **OR** acetaminophen, albuterol, ondansetron **OR** ondansetron (ZOFRAN) IV      Subjective:   Tammy [name] was seen and examined today.  Tolerating diet, no acute complaints, overnight no acute issues.  Appears to be close to her baseline, fairly alert and oriented. Patient denies dizziness, chest pain, shortness of breath, abdominal pain, N/V/D/C.    Objective:   Vitals:   12/14/19 0359 12/14/19 0933 12/14/19 1017 12/14/19 1349  BP: 100/77 (!) 143/47  (!) 153/55  Pulse: 70 76  71  Resp: 18 18  17   Temp: (!) 97.4 F (36.3 C)   98.2 F (36.8 C)  TempSrc:    Oral  SpO2: 99%  96% 98%  Weight:      Height:       No intake or output data in the 24 hours ending 12/14/19 1720   Wt Readings from Last 3 Encounters:  12/12/19 58.1 kg  06/22/19 65.1 kg  03/20/19 67.5 kg   Physical Exam  General: Alert and oriented x 3, NAD  Cardiovascular: S1 S2 clear, RRR. No pedal edema b/l  Respiratory: CTAB, no wheezing, rales or rhonchi  Gastrointestinal: Soft, nontender, nondistended, NBS  Ext: no pedal edema bilaterally  Neuro: no new deficits  Musculoskeletal: No cyanosis, clubbing  Skin: No rashes  Psych: Normal affect and demeanor, alert and oriented x3      Data Reviewed:  I have personally reviewed following labs and imaging studies  Micro Results Recent Results (from the past 240 hour(s))  SARS Coronavirus 2 by RT PCR (hospital order, performed in [place] hospital lab) Nasopharyngeal Nasopharyngeal Swab     Status: None   Collection Time: 12/11/19  7:54 PM   Specimen: Nasopharyngeal Swab  Result Value Ref Range Status   SARS Coronavirus 2 NEGATIVE NEGATIVE Final    Comment: (NOTE) SARS-CoV-2 target nucleic acids are NOT DETECTED.  The SARS-CoV-2 RNA is generally detectable in upper  and lower respiratory specimens during the acute phase of infection. The lowest concentration of SARS-CoV-2 viral copies this assay can detect is 250 copies / mL. A negative result does not preclude SARS-CoV-2 infection and should not be used as the sole basis for treatment or other patient management decisions.  A negative result may occur with improper specimen collection / handling, submission of specimen other than nasopharyngeal swab, presence of viral mutation(s) within the areas targeted by this assay, and inadequate number  of viral copies (<250 copies / mL). A negative result must be combined with clinical observations, patient history, and epidemiological information.  Fact Sheet for Patients:   StrictlyIdeas.no  Fact Sheet for Healthcare Providers: BankingDealers.co.za  This test is not yet approved or  cleared by the [place] FDA and has been authorized for detection and/or diagnosis of SARS-CoV-2 by FDA under an Emergency Use Authorization (EUA).  This EUA will remain in effect (meaning this test can be used) for the duration of the COVID-19 declaration under Section 564(b)(1) of the Act, 21 U.S.C. section 360bbb-3(b)(1), unless the authorization is terminated or revoked sooner.  Performed at [facility], [place] [address] [place], [place] [address]     Radiology Reports DG Chest 1 View  Result Date: 12/11/2019 CLINICAL DATA:  Fall at home. EXAM: CHEST  1 VIEW COMPARISON:  Chest radiograph 02/22/2018 FINDINGS: Median sternotomy and CABG.The cardiomediastinal contours are normal. Upper normal heart size with aortic atherosclerosis. The lungs are clear. Pulmonary vasculature is normal. No consolidation, pleural effusion, or pneumothorax. No acute osseous abnormalities are seen. Bones appear under mineralized. IMPRESSION: 1. No acute chest findings. 2. Median sternotomy and CABG. Aortic Atherosclerosis  (ICD10-I70.0). Electronically Signed   By: Keith Rake M.D.   On: 12/11/2019 21:07   DG Pelvis 1-2 Views  Result Date: 12/11/2019 CLINICAL DATA:  Fall at home. EXAM: PELVIS - 1-2 VIEW COMPARISON:  Pelvis radiograph 02/20/2016. FINDINGS: The cortical margins of the bony pelvis are intact. No fracture. Pubic symphysis and sacroiliac joints are congruent. Age related degenerative change of both hips and the sacroiliac joints. Both femoral heads are well-seated in the respective acetabula. Bones diffusely under mineralized. IMPRESSION: No acute pelvic fracture. Electronically Signed   By: Keith Rake M.D.   On: 12/11/2019 21:07   DG Tibia/Fibula Right  Result Date: 12/11/2019 CLINICAL DATA:  Fall at home with bruising to right lower leg. EXAM: RIGHT TIBIA AND FIBULA - 2 VIEW COMPARISON:  None. FINDINGS: Cortical margins of the tibia and fibular intact. There is no evidence of fracture or other focal bone lesions. Knee and ankle alignment are maintained. The bones are under mineralized. Multiple surgical clips in the medial soft tissues. Soft tissues are unremarkable. IMPRESSION: No fracture of the right lower leg. Electronically Signed   By: Keith Rake M.D.   On: 12/11/2019 21:03   CT Head Wo Contrast  Result Date: 12/11/2019 CLINICAL DATA:  Change in mental status EXAM: CT HEAD WITHOUT CONTRAST TECHNIQUE: Contiguous axial images were obtained from the base of the skull through the vertex without intravenous contrast. COMPARISON:  February 20, 2016 FINDINGS: Brain: No evidence of acute territorial infarction, hemorrhage, hydrocephalus,extra-axial collection or mass lesion/mass effect. There is dilatation the ventricles and sulci consistent with age-related atrophy. Low-attenuation changes in the deep white matter consistent with small vessel ischemia. Vascular: No hyperdense vessel or unexpected calcification. Skull: The skull is intact. No fracture or focal lesion identified. Sinuses/Orbits:  The visualized paranasal sinuses and mastoid air cells are clear. The orbits and globes intact. Other: None Cervical spine: Alignment: There is a grade 1 anterolisthesis of C3 on C4 measuring 3 mm. There is a minimal anterolisthesis of C4 on C5. Skull base and vertebrae: Visualized skull base is intact. No atlanto-occipital dissociation. The vertebral body heights are well maintained. No fracture or pathologic osseous lesion seen. Soft tissues and spinal canal: The visualized paraspinal soft tissues are unremarkable. No prevertebral soft tissue swelling is seen. The spinal canal is grossly  unremarkable, no large epidural collection or significant canal narrowing. Disc levels: Cervical spine spondylosis is seen with disc osteophyte complex and uncovertebral osteophytes most notable at C4-C5 with moderate neural foraminal narrowing and central canal stenosis. Upper chest: Mild centrilobular emphysematous changes seen at both lung apices. Scattered carotid artery calcifications are noted. Thoracic inlet is within normal limits. Other: None IMPRESSION: No acute intracranial abnormality. Findings consistent with age related atrophy and chronic small vessel ischemia No acute fracture or malalignment of the spine. Emphysema (ICD10-J43.9). Electronically Signed   By: Prudencio Pair M.D.   On: 12/11/2019 21:09   CT Cervical Spine Wo Contrast  Result Date: 12/11/2019 CLINICAL DATA:  Change in mental status EXAM: CT HEAD WITHOUT CONTRAST TECHNIQUE: Contiguous axial images were obtained from the base of the skull through the vertex without intravenous contrast. COMPARISON:  February 20, 2016 FINDINGS: Brain: No evidence of acute territorial infarction, hemorrhage, hydrocephalus,extra-axial collection or mass lesion/mass effect. There is dilatation the ventricles and sulci consistent with age-related atrophy. Low-attenuation changes in the deep white matter consistent with small vessel ischemia. Vascular: No hyperdense vessel or  unexpected calcification. Skull: The skull is intact. No fracture or focal lesion identified. Sinuses/Orbits: The visualized paranasal sinuses and mastoid air cells are clear. The orbits and globes intact. Other: None Cervical spine: Alignment: There is a grade 1 anterolisthesis of C3 on C4 measuring 3 mm. There is a minimal anterolisthesis of C4 on C5. Skull base and vertebrae: Visualized skull base is intact. No atlanto-occipital dissociation. The vertebral body heights are well maintained. No fracture or pathologic osseous lesion seen. Soft tissues and spinal canal: The visualized paraspinal soft tissues are unremarkable. No prevertebral soft tissue swelling is seen. The spinal canal is grossly unremarkable, no large epidural collection or significant canal narrowing. Disc levels: Cervical spine spondylosis is seen with disc osteophyte complex and uncovertebral osteophytes most notable at C4-C5 with moderate neural foraminal narrowing and central canal stenosis. Upper chest: Mild centrilobular emphysematous changes seen at both lung apices. Scattered carotid artery calcifications are noted. Thoracic inlet is within normal limits. Other: None IMPRESSION: No acute intracranial abnormality. Findings consistent with age related atrophy and chronic small vessel ischemia No acute fracture or malalignment of the spine. Emphysema (ICD10-J43.9). Electronically Signed   By: Prudencio Pair M.D.   On: 12/11/2019 21:09   US Abdomen Complete  Result Date: 11/26/2019 CLINICAL DATA:  Abdominal pain EXAM: ABDOMEN ULTRASOUND COMPLETE COMPARISON:  CT abdomen and pelvis December 08, 2016 FINDINGS: Gallbladder: Within the gallbladder, there are multiple echogenic foci which move and shadow consistent with cholelithiasis. Largest gallstone measures 1.6 cm in length. There is no appreciable gallbladder wall thickening or pericholecystic fluid. No sonographic Murphy sign noted by sonographer. Common bile duct: Diameter: 10 mm, prominent.  No intrahepatic biliary duct dilatation evident. Note that much of the common bile duct is obscured by gas. Liver: No focal lesion identified. Liver echogenicity overall is increased. Portal vein is patent on color Doppler imaging with normal direction of blood flow towards the liver. IVC: No abnormality visualized. Pancreas: No pancreatic mass or inflammatory focus. Spleen: Size and appearance within normal limits. Right Kidney: Length: 9.4 cm. Echogenicity within normal limits. No mass or hydronephrosis visualized. Left Kidney: Length: 10.7 cm. Echogenicity within normal limits. No mass or hydronephrosis visualized. Abdominal aorta: No aneurysm visualized. Other findings: No evident ascites. IMPRESSION: 1. Cholelithiasis. No gallbladder wall thickening or pericholecystic fluid. 2. Prominence of the proximal common bile duct. Much of the common bile duct obscured  by gas. No appreciable intrahepatic biliary duct dilatation seen currently. Note that there was biliary duct dilatation on prior CT. From an imaging standpoint, MRCP would be the optimum study of choice to further evaluate the biliary ductal system. 3. Diffuse increase in liver echogenicity, a finding indicative of hepatic steatosis. While no focal liver lesions are evident on this study by ultrasound, it must be cautioned that the sensitivity of ultrasound for detection of focal liver lesions is diminished in this circumstance. 4.  Study otherwise unremarkable. Electronically Signed   By: Lowella Grip III M.D.   On: 11/26/2019 13:57   DG Knee Complete 4 Views Left  Result Date: 12/11/2019 CLINICAL DATA:  Fall at home.  Bruising to left knee. EXAM: LEFT KNEE - COMPLETE 4+ VIEW COMPARISON:  None. FINDINGS: No evidence of fracture, dislocation, or joint effusion. Minor tricompartmental peripheral spurring most prominent in the patellofemoral compartment. Small quadriceps tendon enthesophyte. Bony under mineralization. Soft tissues are unremarkable.  IMPRESSION: 1. No acute fracture or subluxation of the left knee. 2. Mild tricompartmental osteoarthritis. Electronically Signed   By: Keith Rake M.D.   On: 12/11/2019 21:02   ECHOCARDIOGRAM COMPLETE  Result Date: 12/12/2019    ECHOCARDIOGRAM REPORT   Patient Name:   Tammy [name] Date of Exam: 12/12/2019 Medical Rec #:  [id number]     Height:       57.0 in Accession #:    [id number]    Weight:       143.5 lb Date of Birth:  Sep 27, 1930     BSA:          1.562 m Patient Age:    15 years      BP:           128/68 mmHg Patient Gender: F             HR:           87 bpm. Exam Location:  Inpatient Procedure: 2D Echo Indications:    syncope R55  History:        Patient has prior history of Echocardiogram examinations, most                 recent 07/19/2019. CAD, Prior CABG; Risk Factors:Hypertension and                 Dyslipidemia.  Sonographer:    Mikki Santee RDCS (AE) Referring Phys: [place]  1. Small intracavitary gradient noted. Peak velocity 0.98 m/s. Peak gradient 3.8 mmHg. Left ventricular ejection fraction, by estimation, is 60 to 65%. The left ventricle has normal function. The left ventricle has no regional wall motion abnormalities.  There is mild concentric left ventricular hypertrophy. Left ventricular diastolic parameters are consistent with Grade I diastolic dysfunction (impaired relaxation). Elevated left ventricular end-diastolic pressure.  2. Right ventricular systolic function is normal. The right ventricular size is normal. There is normal pulmonary artery systolic pressure.  3. The mitral valve is normal in structure. No evidence of mitral valve regurgitation. No evidence of mitral stenosis.  4. The aortic valve is tricuspid. Aortic valve regurgitation is not visualized. No aortic stenosis is present.  5. The inferior vena cava is normal in size with greater than 50% respiratory variability, suggesting right atrial pressure of 3 mmHg. FINDINGS  Left Ventricle:  Small intracavitary gradient noted. Peak velocity 0.98 m/s. Peak gradient 3.8 mmHg. Left ventricular ejection fraction, by estimation, is 60 to 65%. The left ventricle has normal function. The left  ventricle has no regional wall motion abnormalities. The left ventricular internal cavity size was normal in size. There is mild concentric left ventricular hypertrophy. Left ventricular diastolic parameters are consistent with Grade I diastolic dysfunction (impaired relaxation). Elevated left ventricular end-diastolic pressure. Right Ventricle: The right ventricular size is normal. No increase in right ventricular wall thickness. Right ventricular systolic function is normal. There is normal pulmonary artery systolic pressure. The tricuspid regurgitant velocity is 2.76 m/s, and  with an assumed right atrial pressure of 3 mmHg, the estimated right ventricular systolic pressure is 54.6 mmHg. Left Atrium: Left atrial size was normal in size. Right Atrium: Right atrial size was normal in size. Pericardium: There is no evidence of pericardial effusion. Mitral Valve: The mitral valve is normal in structure. Normal mobility of the mitral valve leaflets. No evidence of mitral valve regurgitation. No evidence of mitral valve stenosis. Tricuspid Valve: The tricuspid valve is normal in structure. Tricuspid valve regurgitation is trivial. No evidence of tricuspid stenosis. Aortic Valve: The aortic valve is tricuspid. Aortic valve regurgitation is not visualized. No aortic stenosis is present. Pulmonic Valve: The pulmonic valve was normal in structure. Pulmonic valve regurgitation is not visualized. No evidence of pulmonic stenosis. Aorta: The aortic root is normal in size and structure. Venous: The inferior vena cava is normal in size with greater than 50% respiratory variability, suggesting right atrial pressure of 3 mmHg. IAS/Shunts: No atrial level shunt detected by color flow Doppler.  LEFT VENTRICLE PLAX 2D LVIDd:         3.61  cm  Diastology LVIDs:         2.45 cm  LV e' lateral:   5.40 cm/s LV PW:         1.19 cm  LV E/e' lateral: 14.2 LV IVS:        1.18 cm  LV e' medial:    4.65 cm/s LVOT diam:     2.00 cm  LV E/e' medial:  16.5 LV SV:         75 LV SV Index:   48 LVOT Area:     3.14 cm  RIGHT VENTRICLE RV S prime:     12.60 cm/s TAPSE (M-mode): 1.4 cm LEFT ATRIUM             Index       RIGHT ATRIUM           Index LA diam:        3.20 cm 2.05 cm/m  RA Area:     14.20 cm LA Vol (A2C):   39.1 ml 25.03 ml/m RA Volume:   31.20 ml  19.97 ml/m LA Vol (A4C):   40.7 ml 26.06 ml/m LA Biplane Vol: 41.5 ml 26.57 ml/m  AORTIC VALVE LVOT Vmax:   100.00 cm/s LVOT Vmean:  68.500 cm/s LVOT VTI:    0.238 m  AORTA Ao Root diam: 2.60 cm MITRAL VALVE                TRICUSPID VALVE MV Area (PHT): 2.60 cm     TR Peak grad:   30.5 mmHg MV Decel Time: 292 msec     TR Vmax:        276.00 cm/s MV E velocity: 76.50 cm/s MV A velocity: 119.00 cm/s  SHUNTS MV E/A ratio:  0.64         Systemic VTI:  0.24 m  Systemic Diam: 2.00 cm Skeet Latch MD Electronically signed by Skeet Latch MD Signature Date/Time: 12/12/2019/4:32:19 PM    Final    VAS US CAROTID  Result Date: 12/12/2019 Carotid Arterial Duplex Study Indications:       Syncope. Risk Factors:      Hypertension, hyperlipidemia. Limitations        Today's exam was limited due to heavy calcification and the                    resulting shadowing and the patient's respiratory variation. Comparison Study:  No prior studies. Performing Technologist: Oliver Hum RVT  Examination Guidelines: A complete evaluation includes B-mode imaging, spectral Doppler, color Doppler, and power Doppler as needed of all accessible portions of each vessel. Bilateral testing is considered an integral part of a complete examination. Limited examinations for reoccurring indications may be performed as noted.  Right Carotid Findings:  +----------+--------+--------+--------+--------------------------+--------+           PSV cm/sEDV cm/sStenosisPlaque Description        Comments +----------+--------+--------+--------+--------------------------+--------+ CCA Prox  104     9               smooth and heterogenous            +----------+--------+--------+--------+--------------------------+--------+ CCA Distal59      9               irregular and heterogenous         +----------+--------+--------+--------+--------------------------+--------+ ICA Prox  97      16              calcific                  tortuous +----------+--------+--------+--------+--------------------------+--------+ ICA Distal80      13                                        tortuous +----------+--------+--------+--------+--------------------------+--------+ ECA       112                                                        +----------+--------+--------+--------+--------------------------+--------+ +----------+--------+-------+--------+-------------------+           PSV cm/sEDV cmsDescribeArm Pressure (mmHG) +----------+--------+-------+--------+-------------------+ [id number]                                        +----------+--------+-------+--------+-------------------+ +---------+--------+--+--------+-+---------+ VertebralPSV cm/s49EDV cm/s8Antegrade +---------+--------+--+--------+-+---------+  Left Carotid Findings: +----------+--------+--------+--------+-----------------------+--------+           PSV cm/sEDV cm/sStenosisPlaque Description     Comments +----------+--------+--------+--------+-----------------------+--------+ CCA Prox  100     11              smooth and heterogenous         +----------+--------+--------+--------+-----------------------+--------+ CCA Distal70      12              smooth and heterogenous          +----------+--------+--------+--------+-----------------------+--------+ ICA Prox  110     24              calcific                        +----------+--------+--------+--------+-----------------------+--------+  ICA Distal131     25                                     tortuous +----------+--------+--------+--------+-----------------------+--------+ ECA       201     10                                              +----------+--------+--------+--------+-----------------------+--------+ +----------+--------+--------+--------+-------------------+           PSV cm/sEDV cm/sDescribeArm Pressure (mmHG) +----------+--------+--------+--------+-------------------+ [id number]                                         +----------+--------+--------+--------+-------------------+ +---------+--------+--+--------+-+---------+ VertebralPSV cm/s52EDV cm/s9Antegrade +---------+--------+--+--------+-+---------+   Summary: Right Carotid: Velocities in the right ICA are consistent with a 1-39% stenosis. Left Carotid: Velocities in the left ICA are consistent with a 1-39% stenosis. Vertebrals: Bilateral vertebral arteries demonstrate antegrade flow. *See table(s) above for measurements and observations.  Electronically signed by Antony Contras MD on 12/12/2019 at 4:32:16 PM.    Final     Lab Data:  CBC: Recent Labs  Lab 12/11/19 1929 12/12/19 0445 12/13/19 0319 12/14/19 0307  WBC 12.4* 12.0* 10.5 9.3  NEUTROABS 9.5* 9.4*  --   --   HGB 11.4* 11.3* 11.1* 11.1*  HCT 36.7 36.0 34.7* 34.9*  MCV 91.3 92.1 88.7 89.9  PLT 176 176 167 025   Basic Metabolic Panel: Recent Labs  Lab 12/11/19 1929 12/11/19 2238 12/12/19 0445 12/13/19 0319 12/14/19 0307  NA 142  --  142 139 140  K 4.6  --  3.7 3.8 3.9  CL 109  --  112* 111 109  CO2 20*  --  20* 21* 23  GLUCOSE 109*  --  111* 112* 104*  BUN 26*  --  25* 20 20  CREATININE 0.82  --  0.81 0.71 0.82  CALCIUM 8.3*  --  8.2* 8.5* 8.4*    MG  --  2.3 2.2  --   --   PHOS  --  3.3 2.8  --   --    GFR: Estimated Creatinine Clearance: 35.4 mL/min (by C-G formula based on SCr of 0.82 mg/dL). Liver Function Tests: Recent Labs  Lab 12/11/19 1929 12/12/19 0445  AST 35 37  ALT 34 30  ALKPHOS 67 59  BILITOT 1.0 0.7  PROT 6.5 5.9*  ALBUMIN 3.4* 2.9*   No results for input(s): LIPASE, AMYLASE in the last 168 hours. Recent Labs  Lab 12/11/19 2238  AMMONIA 10   Coagulation Profile: No results for input(s): INR, PROTIME in the last 168 hours. Cardiac Enzymes: Recent Labs  Lab 12/11/19 1929 12/12/19 1701  CKTOTAL 310* 178   BNP (last 3 results) No results for input(s): PROBNP in the last 8760 hours. HbA1C: No results for input(s): HGBA1C in the last 72 hours. CBG: No results for input(s): GLUCAP in the last 168 hours. Lipid Profile: No results for input(s): CHOL, HDL, LDLCALC, TRIG, CHOLHDL, LDLDIRECT in the last 72 hours. Thyroid Function Tests: Recent Labs    12/12/19 0234  TSH 0.473   Anemia Panel: No results for input(s): VITAMINB12, FOLATE, FERRITIN, TIBC, IRON, RETICCTPCT in the last 72 hours. Urine analysis:  Component Value Date/Time   COLORURINE YELLOW 12/11/2019 2130   APPEARANCEUR CLEAR 12/11/2019 2130   LABSPEC 1.025 12/11/2019 2130   PHURINE 5.5 12/11/2019 2130   GLUCOSEU NEGATIVE 12/11/2019 2130   HGBUR NEGATIVE 12/11/2019 2130   BILIRUBINUR SMALL (A) 12/11/2019 2130   KETONESUR 40 (A) 12/11/2019 2130   PROTEINUR NEGATIVE 12/11/2019 2130   UROBILINOGEN 0.2 07/11/2009 2220   NITRITE NEGATIVE 12/11/2019 2130   LEUKOCYTESUR TRACE (A) 12/11/2019 2130     Baylen Buckner M.D. [facility] 12/14/2019, 5:20 PM   Call night coverage person covering after 7pm

## 2019-12-14 NOTE — Care Management Important Message (Signed)
Important Message  Patient Details IM Letter given to the Patient Name: Tammy [name] MRN: [id number] Date of Birth: [birth year]   Medicare Important Message Given:  Yes     Kerin Salen 12/14/2019, 11:48 AM

## 2019-12-14 NOTE — TOC Progression Note (Signed)
Transition of Care [facility]) - Progression Note    Patient Details  Name: Tammy [name] MRN: [id number] Date of Birth: 03-Jul-1930  [facility] CM/SW [place] Phone Number: 12/14/2019, 11:07 AM  Clinical Narrative:   HTN called with authorization for 5 business days, starting today, [id number]. Denied transportation.  Daughter will transport.  Likely transfer Sunday or Monday to [facility]. No COVID test needed as she is vaccinated. TOC will continue to follow during the course of hospitalization.     Expected Discharge Plan: Skilled Nursing Facility Barriers to Discharge: SNF Pending bed offer  Expected Discharge Plan and Services Expected Discharge Plan: [place] In-house Referral: Clinical Social Work Discharge Planning Services: CM Consult Post Acute Care Choice: [place] arrangements for the past 2 months: Single Family Home                                       Social Determinants of Health (SDOH) Interventions    Readmission Risk Interventions No flowsheet data found.

## 2019-12-15 LAB — CBC
HCT: 35.8 % — ABNORMAL LOW (ref 36.0–46.0)
Hemoglobin: 11.4 g/dL — ABNORMAL LOW (ref 12.0–15.0)
MCH: 28.7 pg (ref 26.0–34.0)
MCHC: 31.8 g/dL (ref 30.0–36.0)
MCV: 90.2 fL (ref 80.0–100.0)
Platelets: 205 10*3/uL (ref 150–400)
RBC: 3.97 MIL/uL (ref 3.87–5.11)
RDW: 14.3 % (ref 11.5–15.5)
WBC: 10.6 10*3/uL — ABNORMAL HIGH (ref 4.0–10.5)
nRBC: 0 % (ref 0.0–0.2)

## 2019-12-15 LAB — BASIC METABOLIC PANEL
Anion gap: 10 (ref 5–15)
BUN: 20 mg/dL (ref 8–23)
CO2: 25 mmol/L (ref 22–32)
Calcium: 9.1 mg/dL (ref 8.9–10.3)
Chloride: 106 mmol/L (ref 98–111)
Creatinine, Ser: 0.78 mg/dL (ref 0.44–1.00)
GFR calc Af Amer: >60 mL/min (ref >60)
GFR calc non Af Amer: >60 mL/min (ref >60)
Glucose, Bld: 102 mg/dL — ABNORMAL HIGH (ref 70–99)
Potassium: 3.8 mmol/L (ref 3.5–5.1)
Sodium: 141 mmol/L (ref 135–145)

## 2019-12-15 MED ORDER — ACETAMINOPHEN 325 MG PO TABS
650.0000 mg | ORAL_TABLET | Freq: Three times a day (TID) | ORAL | Status: DC
  Administered 2019-12-15 – 2019-12-17 (×8): 650 mg via ORAL
  Filled 2019-12-15 (×8): qty 2

## 2019-12-15 NOTE — Progress Notes (Signed)
[facility]                                                                              Patient Demographics  Tammy [name], is a 86 y.o. female, DOB - Sep 23, 1930, QPR:[id number]  Admit date - 12/11/2019   Admitting Physician Toy Baker, MD  Outpatient Primary MD for the patient is [name], MD  Outpatient specialists:   LOS - 3  days   Medical records reviewed and are as summarized below:    Chief Complaint  Patient presents with   Fall       Brief summary   Patient is a 86 year old female with history of dementia, hypertension, CAD, pulmonary hypertension, COPD, occasionally on O2, A. fib on anticoagulation, essential tremor presented from home, found by daughter on the floor.  She lives at home alone, has dementia, possibly had been on the floor for 2 to 3 days.  Family became concerned when she was not answering her phone, found her on the floor covered in feces, unable to get up, had abrasions on bilateral legs.  Patient was unsure how she fell Has been vaccinated against Covid.  Covid test negative.  Assessment & Plan    Principal Problem:   Acute metabolic encephalopathy superimposed on dementia -Likely worsened due to dehydration, fall, rule out rhabdomyolysis -UA showed ketones however no nitrite, no bacteria, no hypercarbia.  Nonfocal neurological exam, cooperative.  Ammonia level 10. -CT head showed no acute intracranial abnormality, age-related atrophy and chronic small vessel ischemic changes -Alert and awake, awaiting SNF  Active problems  Fall: - possible syncopal episode vs mechanical fall  - PT evaluation recommended skilled nursing facility, lives alone, appears very deconditioned  - 2D echo showed EF of 60 to 65%, no regional wall motion abnormalities -Awaiting skilled nursing facility, no acute complaints  Diarrhea, dehydration -No further diarrhea, leukocytosis resolved   CAD -Currently no chest pain or acute  shortness of breath, continue aspirin, statin, beta-blocker  -2D echo with normal EF, no regional wall motion abnormalities   Essential tremor Chronic, continue beta-blocker  Essential hypertension -Continue beta-blocker  Hyperlipidemia -Continue statin  History of paroxysmal atrial fibrillation -Continue Lopressor for rate control and as BP allows -Not on anticoagulation due to high fall risk  History of pulmonary hypertension -Currently no acute issues  History of biliary dilatation, hepatic steatosis -No acute issues  Moderate protein calorie malnutrition with obesity -Albumin 2.9, dietitian consult Estimated body mass index is 27.7 kg/m as calculated from the following:   Height as of this encounter: 4\' 9"  (1.448 m).   Weight as of this encounter: 58.1 kg.  Code Status: Full code DVT Prophylaxis:  SCD's Family Communication: Discussed all imaging results, lab results, explained to the patient and daughter on phone on 7/29   Disposition Plan:     Status is: Inpatient  The patient will require care spanning > 2 midnights and should be moved to inpatient because: Inpatient level of care appropriate due to severity of illness  Dispo: The patient is from: Home              Anticipated d/c is  to: SNF              Anticipated d/c date is: 2 days              Patient currently is medically stable to d/c.  Awaiting skilled nursing facility, no acute issues, plan to DC on Monday to SNF      Time Spent in minutes   25 minutes  Procedures:  None  Consultants:   None  Antimicrobials:   Anti-infectives (From admission, onward)   None         Medications  Scheduled Meds:  acetaminophen  650 mg Oral TID   aspirin  81 mg Oral Daily   atorvastatin  10 mg Oral Daily   DULoxetine  60 mg Oral Daily   fluticasone furoate-vilanterol  1 puff Inhalation Daily   folic acid  1 mg Oral Daily   metoprolol tartrate  25 mg Oral BID   multivitamin with  minerals  1 tablet Oral Daily   pantoprazole  40 mg Oral BID   pentoxifylline  400 mg Oral BID   sodium chloride flush  3 mL Intravenous Q12H   Continuous Infusions: PRN Meds:.acetaminophen **OR** acetaminophen, albuterol, ondansetron **OR** ondansetron (ZOFRAN) IV      Subjective:   Tammy [name] was seen and examined today.  No complaints per the patient, tolerating diet.  No acute events overnight.  No fevers or chills.    Objective:   Vitals:   12/15/19 1125 12/15/19 1215 12/15/19 1216 12/15/19 1405  BP: (!) 150/59 (!) 148/44 (!) 152/71 (!) 169/55  Pulse: 73 66 62 68  Resp: 16 20 20 18   Temp:  (!) 97.5 F (36.4 C) 98.1 F (36.7 C) 97.6 F (36.4 C)  TempSrc:  Oral    SpO2: 100% 99% 96% 99%  Weight:      Height:        Intake/Output Summary (Last 24 hours) at 12/15/2019 1507 Last data filed at 12/15/2019 1245 Gross per 24 hour  Intake 1200 ml  Output 800 ml  Net 400 ml     Wt Readings from Last 3 Encounters:  12/12/19 58.1 kg  06/22/19 65.1 kg  03/20/19 67.5 kg   Physical Exam  General: Alert and oriented x 3, NAD  Cardiovascular: S1 S2 clear, RRR. No pedal edema b/l  Respiratory: CTAB, no wheezing, rales or rhonchi  Gastrointestinal: Soft, nontender, nondistended, NBS  Ext: no pedal edema bilaterally  Neuro: no new deficits  Musculoskeletal: No cyanosis, clubbing  Skin: No rashes  Psych: Normal affect and demeanor, alert and oriented x3     Data Reviewed:  I have personally reviewed following labs and imaging studies  Micro Results Recent Results (from the past 240 hour(s))  SARS Coronavirus 2 by RT PCR (hospital order, performed in [place] hospital lab) Nasopharyngeal Nasopharyngeal Swab     Status: None   Collection Time: 12/11/19  7:54 PM   Specimen: Nasopharyngeal Swab  Result Value Ref Range Status   SARS Coronavirus 2 NEGATIVE NEGATIVE Final    Comment: (NOTE) SARS-CoV-2 target nucleic acids are NOT DETECTED.  The SARS-CoV-2  RNA is generally detectable in upper and lower respiratory specimens during the acute phase of infection. The lowest concentration of SARS-CoV-2 viral copies this assay can detect is 250 copies / mL. A negative result does not preclude SARS-CoV-2 infection and should not be used as the sole basis for treatment or other patient management decisions.  A negative result may occur with improper  specimen collection / handling, submission of specimen other than nasopharyngeal swab, presence of viral mutation(s) within the areas targeted by this assay, and inadequate number of viral copies (<250 copies / mL). A negative result must be combined with clinical observations, patient history, and epidemiological information.  Fact Sheet for Patients:   StrictlyIdeas.no  Fact Sheet for Healthcare Providers: BankingDealers.co.za  This test is not yet approved or  cleared by the [place] FDA and has been authorized for detection and/or diagnosis of SARS-CoV-2 by FDA under an Emergency Use Authorization (EUA).  This EUA will remain in effect (meaning this test can be used) for the duration of the COVID-19 declaration under Section 564(b)(1) of the Act, 21 U.S.C. section 360bbb-3(b)(1), unless the authorization is terminated or revoked sooner.  Performed at [facility], [place] [address] [place], [place] [address]     Radiology Reports DG Chest 1 View  Result Date: 12/11/2019 CLINICAL DATA:  Fall at home. EXAM: CHEST  1 VIEW COMPARISON:  Chest radiograph 02/22/2018 FINDINGS: Median sternotomy and CABG.The cardiomediastinal contours are normal. Upper normal heart size with aortic atherosclerosis. The lungs are clear. Pulmonary vasculature is normal. No consolidation, pleural effusion, or pneumothorax. No acute osseous abnormalities are seen. Bones appear under mineralized. IMPRESSION: 1. No acute chest findings. 2. Median sternotomy  and CABG. Aortic Atherosclerosis (ICD10-I70.0). Electronically Signed   By: Keith Rake M.D.   On: 12/11/2019 21:07   DG Pelvis 1-2 Views  Result Date: 12/11/2019 CLINICAL DATA:  Fall at home. EXAM: PELVIS - 1-2 VIEW COMPARISON:  Pelvis radiograph 02/20/2016. FINDINGS: The cortical margins of the bony pelvis are intact. No fracture. Pubic symphysis and sacroiliac joints are congruent. Age related degenerative change of both hips and the sacroiliac joints. Both femoral heads are well-seated in the respective acetabula. Bones diffusely under mineralized. IMPRESSION: No acute pelvic fracture. Electronically Signed   By: Keith Rake M.D.   On: 12/11/2019 21:07   DG Tibia/Fibula Right  Result Date: 12/11/2019 CLINICAL DATA:  Fall at home with bruising to right lower leg. EXAM: RIGHT TIBIA AND FIBULA - 2 VIEW COMPARISON:  None. FINDINGS: Cortical margins of the tibia and fibular intact. There is no evidence of fracture or other focal bone lesions. Knee and ankle alignment are maintained. The bones are under mineralized. Multiple surgical clips in the medial soft tissues. Soft tissues are unremarkable. IMPRESSION: No fracture of the right lower leg. Electronically Signed   By: Keith Rake M.D.   On: 12/11/2019 21:03   CT Head Wo Contrast  Result Date: 12/11/2019 CLINICAL DATA:  Change in mental status EXAM: CT HEAD WITHOUT CONTRAST TECHNIQUE: Contiguous axial images were obtained from the base of the skull through the vertex without intravenous contrast. COMPARISON:  February 20, 2016 FINDINGS: Brain: No evidence of acute territorial infarction, hemorrhage, hydrocephalus,extra-axial collection or mass lesion/mass effect. There is dilatation the ventricles and sulci consistent with age-related atrophy. Low-attenuation changes in the deep white matter consistent with small vessel ischemia. Vascular: No hyperdense vessel or unexpected calcification. Skull: The skull is intact. No fracture or focal  lesion identified. Sinuses/Orbits: The visualized paranasal sinuses and mastoid air cells are clear. The orbits and globes intact. Other: None Cervical spine: Alignment: There is a grade 1 anterolisthesis of C3 on C4 measuring 3 mm. There is a minimal anterolisthesis of C4 on C5. Skull base and vertebrae: Visualized skull base is intact. No atlanto-occipital dissociation. The vertebral body heights are well maintained. No fracture or pathologic osseous lesion  seen. Soft tissues and spinal canal: The visualized paraspinal soft tissues are unremarkable. No prevertebral soft tissue swelling is seen. The spinal canal is grossly unremarkable, no large epidural collection or significant canal narrowing. Disc levels: Cervical spine spondylosis is seen with disc osteophyte complex and uncovertebral osteophytes most notable at C4-C5 with moderate neural foraminal narrowing and central canal stenosis. Upper chest: Mild centrilobular emphysematous changes seen at both lung apices. Scattered carotid artery calcifications are noted. Thoracic inlet is within normal limits. Other: None IMPRESSION: No acute intracranial abnormality. Findings consistent with age related atrophy and chronic small vessel ischemia No acute fracture or malalignment of the spine. Emphysema (ICD10-J43.9). Electronically Signed   By: Prudencio Pair M.D.   On: 12/11/2019 21:09   CT Cervical Spine Wo Contrast  Result Date: 12/11/2019 CLINICAL DATA:  Change in mental status EXAM: CT HEAD WITHOUT CONTRAST TECHNIQUE: Contiguous axial images were obtained from the base of the skull through the vertex without intravenous contrast. COMPARISON:  February 20, 2016 FINDINGS: Brain: No evidence of acute territorial infarction, hemorrhage, hydrocephalus,extra-axial collection or mass lesion/mass effect. There is dilatation the ventricles and sulci consistent with age-related atrophy. Low-attenuation changes in the deep white matter consistent with small vessel  ischemia. Vascular: No hyperdense vessel or unexpected calcification. Skull: The skull is intact. No fracture or focal lesion identified. Sinuses/Orbits: The visualized paranasal sinuses and mastoid air cells are clear. The orbits and globes intact. Other: None Cervical spine: Alignment: There is a grade 1 anterolisthesis of C3 on C4 measuring 3 mm. There is a minimal anterolisthesis of C4 on C5. Skull base and vertebrae: Visualized skull base is intact. No atlanto-occipital dissociation. The vertebral body heights are well maintained. No fracture or pathologic osseous lesion seen. Soft tissues and spinal canal: The visualized paraspinal soft tissues are unremarkable. No prevertebral soft tissue swelling is seen. The spinal canal is grossly unremarkable, no large epidural collection or significant canal narrowing. Disc levels: Cervical spine spondylosis is seen with disc osteophyte complex and uncovertebral osteophytes most notable at C4-C5 with moderate neural foraminal narrowing and central canal stenosis. Upper chest: Mild centrilobular emphysematous changes seen at both lung apices. Scattered carotid artery calcifications are noted. Thoracic inlet is within normal limits. Other: None IMPRESSION: No acute intracranial abnormality. Findings consistent with age related atrophy and chronic small vessel ischemia No acute fracture or malalignment of the spine. Emphysema (ICD10-J43.9). Electronically Signed   By: Prudencio Pair M.D.   On: 12/11/2019 21:09   US Abdomen Complete  Result Date: 11/26/2019 CLINICAL DATA:  Abdominal pain EXAM: ABDOMEN ULTRASOUND COMPLETE COMPARISON:  CT abdomen and pelvis December 08, 2016 FINDINGS: Gallbladder: Within the gallbladder, there are multiple echogenic foci which move and shadow consistent with cholelithiasis. Largest gallstone measures 1.6 cm in length. There is no appreciable gallbladder wall thickening or pericholecystic fluid. No sonographic Murphy sign noted by sonographer.  Common bile duct: Diameter: 10 mm, prominent. No intrahepatic biliary duct dilatation evident. Note that much of the common bile duct is obscured by gas. Liver: No focal lesion identified. Liver echogenicity overall is increased. Portal vein is patent on color Doppler imaging with normal direction of blood flow towards the liver. IVC: No abnormality visualized. Pancreas: No pancreatic mass or inflammatory focus. Spleen: Size and appearance within normal limits. Right Kidney: Length: 9.4 cm. Echogenicity within normal limits. No mass or hydronephrosis visualized. Left Kidney: Length: 10.7 cm. Echogenicity within normal limits. No mass or hydronephrosis visualized. Abdominal aorta: No aneurysm visualized. Other findings: No evident ascites.  IMPRESSION: 1. Cholelithiasis. No gallbladder wall thickening or pericholecystic fluid. 2. Prominence of the proximal common bile duct. Much of the common bile duct obscured by gas. No appreciable intrahepatic biliary duct dilatation seen currently. Note that there was biliary duct dilatation on prior CT. From an imaging standpoint, MRCP would be the optimum study of choice to further evaluate the biliary ductal system. 3. Diffuse increase in liver echogenicity, a finding indicative of hepatic steatosis. While no focal liver lesions are evident on this study by ultrasound, it must be cautioned that the sensitivity of ultrasound for detection of focal liver lesions is diminished in this circumstance. 4.  Study otherwise unremarkable. Electronically Signed   By: Lowella Grip III M.D.   On: 11/26/2019 13:57   DG Knee Complete 4 Views Left  Result Date: 12/11/2019 CLINICAL DATA:  Fall at home.  Bruising to left knee. EXAM: LEFT KNEE - COMPLETE 4+ VIEW COMPARISON:  None. FINDINGS: No evidence of fracture, dislocation, or joint effusion. Minor tricompartmental peripheral spurring most prominent in the patellofemoral compartment. Small quadriceps tendon enthesophyte. Bony under  mineralization. Soft tissues are unremarkable. IMPRESSION: 1. No acute fracture or subluxation of the left knee. 2. Mild tricompartmental osteoarthritis. Electronically Signed   By: Keith Rake M.D.   On: 12/11/2019 21:02   ECHOCARDIOGRAM COMPLETE  Result Date: 12/12/2019    ECHOCARDIOGRAM REPORT   Patient Name:   ZIAIRE BIESER Date of Exam: 12/12/2019 Medical Rec #:  [id number]     Height:       57.0 in Accession #:    [id number]    Weight:       143.5 lb Date of Birth:  1930-10-07     BSA:          1.562 m Patient Age:    65 years      BP:           128/68 mmHg Patient Gender: F             HR:           87 bpm. Exam Location:  Inpatient Procedure: 2D Echo Indications:    syncope R55  History:        Patient has prior history of Echocardiogram examinations, most                 recent 07/19/2019. CAD, Prior CABG; Risk Factors:Hypertension and                 Dyslipidemia.  Sonographer:    Mikki Santee RDCS (AE) Referring Phys: [place]  1. Small intracavitary gradient noted. Peak velocity 0.98 m/s. Peak gradient 3.8 mmHg. Left ventricular ejection fraction, by estimation, is 60 to 65%. The left ventricle has normal function. The left ventricle has no regional wall motion abnormalities.  There is mild concentric left ventricular hypertrophy. Left ventricular diastolic parameters are consistent with Grade I diastolic dysfunction (impaired relaxation). Elevated left ventricular end-diastolic pressure.  2. Right ventricular systolic function is normal. The right ventricular size is normal. There is normal pulmonary artery systolic pressure.  3. The mitral valve is normal in structure. No evidence of mitral valve regurgitation. No evidence of mitral stenosis.  4. The aortic valve is tricuspid. Aortic valve regurgitation is not visualized. No aortic stenosis is present.  5. The inferior vena cava is normal in size with greater than 50% respiratory variability, suggesting right atrial  pressure of 3 mmHg. FINDINGS  Left Ventricle: Small intracavitary gradient noted. Peak  velocity 0.98 m/s. Peak gradient 3.8 mmHg. Left ventricular ejection fraction, by estimation, is 60 to 65%. The left ventricle has normal function. The left ventricle has no regional wall motion abnormalities. The left ventricular internal cavity size was normal in size. There is mild concentric left ventricular hypertrophy. Left ventricular diastolic parameters are consistent with Grade I diastolic dysfunction (impaired relaxation). Elevated left ventricular end-diastolic pressure. Right Ventricle: The right ventricular size is normal. No increase in right ventricular wall thickness. Right ventricular systolic function is normal. There is normal pulmonary artery systolic pressure. The tricuspid regurgitant velocity is 2.76 m/s, and  with an assumed right atrial pressure of 3 mmHg, the estimated right ventricular systolic pressure is 02.6 mmHg. Left Atrium: Left atrial size was normal in size. Right Atrium: Right atrial size was normal in size. Pericardium: There is no evidence of pericardial effusion. Mitral Valve: The mitral valve is normal in structure. Normal mobility of the mitral valve leaflets. No evidence of mitral valve regurgitation. No evidence of mitral valve stenosis. Tricuspid Valve: The tricuspid valve is normal in structure. Tricuspid valve regurgitation is trivial. No evidence of tricuspid stenosis. Aortic Valve: The aortic valve is tricuspid. Aortic valve regurgitation is not visualized. No aortic stenosis is present. Pulmonic Valve: The pulmonic valve was normal in structure. Pulmonic valve regurgitation is not visualized. No evidence of pulmonic stenosis. Aorta: The aortic root is normal in size and structure. Venous: The inferior vena cava is normal in size with greater than 50% respiratory variability, suggesting right atrial pressure of 3 mmHg. IAS/Shunts: No atrial level shunt detected by color flow  Doppler.  LEFT VENTRICLE PLAX 2D LVIDd:         3.61 cm  Diastology LVIDs:         2.45 cm  LV e' lateral:   5.40 cm/s LV PW:         1.19 cm  LV E/e' lateral: 14.2 LV IVS:        1.18 cm  LV e' medial:    4.65 cm/s LVOT diam:     2.00 cm  LV E/e' medial:  16.5 LV SV:         75 LV SV Index:   48 LVOT Area:     3.14 cm  RIGHT VENTRICLE RV S prime:     12.60 cm/s TAPSE (M-mode): 1.4 cm LEFT ATRIUM             Index       RIGHT ATRIUM           Index LA diam:        3.20 cm 2.05 cm/m  RA Area:     14.20 cm LA Vol (A2C):   39.1 ml 25.03 ml/m RA Volume:   31.20 ml  19.97 ml/m LA Vol (A4C):   40.7 ml 26.06 ml/m LA Biplane Vol: 41.5 ml 26.57 ml/m  AORTIC VALVE LVOT Vmax:   100.00 cm/s LVOT Vmean:  68.500 cm/s LVOT VTI:    0.238 m  AORTA Ao Root diam: 2.60 cm MITRAL VALVE                TRICUSPID VALVE MV Area (PHT): 2.60 cm     TR Peak grad:   30.5 mmHg MV Decel Time: 292 msec     TR Vmax:        276.00 cm/s MV E velocity: 76.50 cm/s MV A velocity: 119.00 cm/s  SHUNTS MV E/A ratio:  0.64  Systemic VTI:  0.24 m                             Systemic Diam: 2.00 cm Skeet Latch MD Electronically signed by Skeet Latch MD Signature Date/Time: 12/12/2019/4:32:19 PM    Final    VAS US CAROTID  Result Date: 12/12/2019 Carotid Arterial Duplex Study Indications:       Syncope. Risk Factors:      Hypertension, hyperlipidemia. Limitations        Today's exam was limited due to heavy calcification and the                    resulting shadowing and the patient's respiratory variation. Comparison Study:  No prior studies. Performing Technologist: Oliver Hum RVT  Examination Guidelines: A complete evaluation includes B-mode imaging, spectral Doppler, color Doppler, and power Doppler as needed of all accessible portions of each vessel. Bilateral testing is considered an integral part of a complete examination. Limited examinations for reoccurring indications may be performed as noted.  Right Carotid Findings:  +----------+--------+--------+--------+--------------------------+--------+             PSV cm/s EDV cm/s Stenosis Plaque Description         Comments  +----------+--------+--------+--------+--------------------------+--------+  CCA Prox   104      9                 smooth and heterogenous              +----------+--------+--------+--------+--------------------------+--------+  CCA Distal 59       9                 irregular and heterogenous           +----------+--------+--------+--------+--------------------------+--------+  ICA Prox   97       16                calcific                   tortuous  +----------+--------+--------+--------+--------------------------+--------+  ICA Distal 80       13                                           tortuous  +----------+--------+--------+--------+--------------------------+--------+  ECA        112                                                             +----------+--------+--------+--------+--------------------------+--------+ +----------+--------+-------+--------+-------------------+             PSV cm/s EDV cms Describe Arm Pressure (mmHG)  +----------+--------+-------+--------+-------------------+  Subclavian 145                                            +----------+--------+-------+--------+-------------------+ +---------+--------+--+--------+-+---------+  Vertebral PSV cm/s 49 EDV cm/s 8 Antegrade  +---------+--------+--+--------+-+---------+  Left Carotid Findings: +----------+--------+--------+--------+-----------------------+--------+             PSV cm/s EDV cm/s Stenosis Plaque Description      Comments  +----------+--------+--------+--------+-----------------------+--------+  CCA Prox   100      11                smooth and heterogenous           +----------+--------+--------+--------+-----------------------+--------+  CCA Distal 70       12                smooth and heterogenous            +----------+--------+--------+--------+-----------------------+--------+  ICA Prox   110      24                calcific                          +----------+--------+--------+--------+-----------------------+--------+  ICA Distal 131      25                                        tortuous  +----------+--------+--------+--------+-----------------------+--------+  ECA        201      10                                                  +----------+--------+--------+--------+-----------------------+--------+ +----------+--------+--------+--------+-------------------+             PSV cm/s EDV cm/s Describe Arm Pressure (mmHG)  +----------+--------+--------+--------+-------------------+  Subclavian 139                                             +----------+--------+--------+--------+-------------------+ +---------+--------+--+--------+-+---------+  Vertebral PSV cm/s 52 EDV cm/s 9 Antegrade  +---------+--------+--+--------+-+---------+   Summary: Right Carotid: Velocities in the right ICA are consistent with a 1-39% stenosis. Left Carotid: Velocities in the left ICA are consistent with a 1-39% stenosis. Vertebrals: Bilateral vertebral arteries demonstrate antegrade flow. *See table(s) above for measurements and observations.  Electronically signed by Antony Contras MD on 12/12/2019 at 4:32:16 PM.    Final     Lab Data:  CBC: Recent Labs  Lab 12/11/19 1929 12/12/19 0445 12/13/19 0319 12/14/19 0307 12/15/19 0346  WBC 12.4* 12.0* 10.5 9.3 10.6*  NEUTROABS 9.5* 9.4*  --   --   --   HGB 11.4* 11.3* 11.1* 11.1* 11.4*  HCT 36.7 36.0 34.7* 34.9* 35.8*  MCV 91.3 92.1 88.7 89.9 90.2  PLT 176 176 167 188 782   Basic Metabolic Panel: Recent Labs  Lab 12/11/19 1929 12/11/19 2238 12/12/19 0445 12/13/19 0319 12/14/19 0307 12/15/19 0346  NA 142  --  142 139 140 141  K 4.6  --  3.7 3.8 3.9 3.8  CL 109  --  112* 111 109 106  CO2 20*  --  20* 21* 23 25  GLUCOSE 109*  --  111* 112* 104* 102*  BUN 26*  --  25*  20 20 20   CREATININE 0.82  --  0.81 0.71 0.82 0.78  CALCIUM 8.3*  --  8.2* 8.5* 8.4* 9.1  MG  --  2.3 2.2  --   --   --   PHOS  --  3.3 2.8  --   --   --  GFR: Estimated Creatinine Clearance: 36.3 mL/min (by C-G formula based on SCr of 0.78 mg/dL). Liver Function Tests: Recent Labs  Lab 12/11/19 1929 12/12/19 0445  AST 35 37  ALT 34 30  ALKPHOS 67 59  BILITOT 1.0 0.7  PROT 6.5 5.9*  ALBUMIN 3.4* 2.9*   No results for input(s): LIPASE, AMYLASE in the last 168 hours. Recent Labs  Lab 12/11/19 2238  AMMONIA 10   Coagulation Profile: No results for input(s): INR, PROTIME in the last 168 hours. Cardiac Enzymes: Recent Labs  Lab 12/11/19 1929 12/12/19 1701  CKTOTAL 310* 178   BNP (last 3 results) No results for input(s): PROBNP in the last 8760 hours. HbA1C: No results for input(s): HGBA1C in the last 72 hours. CBG: No results for input(s): GLUCAP in the last 168 hours. Lipid Profile: No results for input(s): CHOL, HDL, LDLCALC, TRIG, CHOLHDL, LDLDIRECT in the last 72 hours. Thyroid Function Tests: No results for input(s): TSH, T4TOTAL, FREET4, T3FREE, THYROIDAB in the last 72 hours. Anemia Panel: No results for input(s): VITAMINB12, FOLATE, FERRITIN, TIBC, IRON, RETICCTPCT in the last 72 hours. Urine analysis:    Component Value Date/Time   COLORURINE YELLOW 12/11/2019 2130   APPEARANCEUR CLEAR 12/11/2019 2130   LABSPEC 1.025 12/11/2019 2130   PHURINE 5.5 12/11/2019 2130   GLUCOSEU NEGATIVE 12/11/2019 2130   HGBUR NEGATIVE 12/11/2019 2130   BILIRUBINUR SMALL (A) 12/11/2019 2130   KETONESUR 40 (A) 12/11/2019 2130   PROTEINUR NEGATIVE 12/11/2019 2130   UROBILINOGEN 0.2 07/11/2009 2220   NITRITE NEGATIVE 12/11/2019 2130   LEUKOCYTESUR TRACE (A) 12/11/2019 2130     Tiffnay Bossi M.D. [facility] 12/15/2019, 3:07 PM   Call night coverage person covering after 7pm

## 2019-12-16 MED ORDER — IRBESARTAN 300 MG PO TABS
300.0000 mg | ORAL_TABLET | Freq: Every day | ORAL | Status: DC
  Administered 2019-12-16: 300 mg via ORAL
  Filled 2019-12-16 (×2): qty 1

## 2019-12-16 MED ORDER — AMLODIPINE BESYLATE 5 MG PO TABS
2.5000 mg | ORAL_TABLET | Freq: Every day | ORAL | Status: DC
  Administered 2019-12-16: 2.5 mg via ORAL
  Filled 2019-12-16 (×2): qty 1

## 2019-12-16 MED ORDER — FUROSEMIDE 20 MG PO TABS: 20.0000 mg | ORAL_TABLET | Freq: Every day | ORAL | 2 refills | Status: DC | PRN

## 2019-12-16 MED ORDER — ACETAMINOPHEN 325 MG PO TABS: 650.0000 mg | ORAL_TABLET | Freq: Four times a day (QID) | ORAL | Status: AC | PRN

## 2019-12-16 NOTE — Progress Notes (Signed)
Occupational Therapy Treatment Patient Details Name: RALPH BENAVIDEZ MRN: [id number] DOB: Mar 01, 1931 Today's Date: 12/16/2019    History of present illness Patient is a 86 year old female with history of dementia, hypertension, CAD, pulmonary hypertension, COPD, occasionally on O2, A. fib on anticoagulation, essential tremor presented from home, found by daughter on the floor.  She lives at home alone, has dementia, possibly had been on the floor for 2 to 3 days.  Family became concerned when she was not answering her phone, found her on the floor covered in feces, unable to get up, had abrasions on bilateral legs.  Patient was unsure how she fell.  Pt admitted with Acute metabolic encephalopathy superimposed on dementia and dehydration.   OT comments  Treatment focused on improving patient's balanc, independence and safety with ADLs. Patient performed bathing task seated on toilet, performed toileting and standing at the sink for grooming. Patient did well without loss of balance with use of walker and min guard provided by therapist. She did need verbal cues for safety and hand placement. Patient required verbal cues to sequence ADL tasks or improve quality of task but predominantly able to perform tasks functionally. Patient progressing. Continue to recommend SNF at discharge.   Follow Up Recommendations  SNF    Equipment Recommendations  None recommended by OT    Recommendations for Other Services      Precautions / Restrictions Precautions Precautions: Fall Restrictions Weight Bearing Restrictions: No       Mobility Bed Mobility Overal bed mobility: Needs Assistance Bed Mobility: Supine to Sit     Supine to sit: Supervision        Transfers Overall transfer level: Needs assistance Equipment used: Rolling walker (2 wheeled) Transfers: Sit to/from [facility] Sit to Stand: Min guard Stand pivot transfers: Min guard       General transfer comment: min  guard and rw for standing, ambulating. verbal cues for safety and hand placement    Balance Overall balance assessment: Needs assistance Sitting-balance support: No upper extremity supported;Feet unsupported       Standing balance support: During functional activity;Bilateral upper extremity supported Standing balance-Leahy Scale: Fair Standing balance comment: use of rw. can stand with one hand support to perform ADLs                           ADL either performed or assessed with clinical judgement   ADL Overall ADL's : Needs assistance/impaired Eating/Feeding: Independent   Grooming: Oral care;Wash/dry face;Wash/dry hands;Min guard;Brushing hair;Cueing for sequencing Grooming Details (indicate cue type and reason): min guard to perform grooming tasks standing at sink. Patient required 1-2 verbal cues Upper Body Bathing: Set up;Sitting Upper Body Bathing Details (indicate cue type and reason): bathed sitting on toilet with set up and verbal cues for quality of task Lower Body Bathing: Minimal assistance;Set up;Sit to/from stand;Cueing for sequencing Lower Body Bathing Details (indicate cue type and reason): min assist for buttocks and back of thighs     Lower Body Dressing: Set up;Min guard;Moderate assistance;Sit to/from stand Lower Body Dressing Details (indicate cue type and reason): mod assist to donn underwear - patient exhibited difficulty reaching feet at toilet height. Toilet Transfer: Min guard;Ambulation;Grab bars;Cueing for safety;Regular Toilet   Toileting- Clothing Manipulation and Hygiene: Moderate assistance;Sit to/from stand Toileting - Clothing Manipulation Details (indicate cue type and reason): mod assist to pull up mesh panties due to difficulty grabbing material     Functional mobility during ADLs:  Min guard;Rolling walker       Vision   Vision Assessment?: No apparent visual deficits   Perception     Praxis      Cognition  Arousal/Alertness: Awake/alert Behavior During Therapy: WFL for tasks assessed/performed Overall Cognitive Status: History of cognitive impairments - at baseline (hx of dementia)                                          Exercises     Shoulder Instructions       General Comments      Pertinent Vitals/ Pain       Pain Assessment: No/denies pain  Home Living                                          Prior Functioning/Environment              Frequency  Min 2X/week        Progress Toward Goals  OT Goals(current goals can now be found in the care plan section)  Progress towards OT goals: Progressing toward goals  Acute Rehab OT Goals Patient Stated Goal: to go home after rehab OT Goal Formulation: With patient Time For Goal Achievement: 12/26/19 Potential to Achieve Goals: Good  Plan      Co-evaluation                 AM-PAC OT "6 Clicks" Daily Activity     Outcome Measure   Help from another person eating meals?: None Help from another person taking care of personal grooming?: A Little Help from another person toileting, which includes using toliet, bedpan, or urinal?: A Lot Help from another person bathing (including washing, rinsing, drying)?: A Little Help from another person to put on and taking off regular upper body clothing?: A Little Help from another person to put on and taking off regular lower body clothing?: A Lot 6 Click Score: 17    End of Session Equipment Utilized During Treatment: Gait belt;Rolling walker      Activity Tolerance Patient tolerated treatment well   Patient Left with call bell/phone within reach;in chair;with chair alarm set   Nurse Communication  (okay to see per RN)        Time: 0921-1005 OT Time Calculation (min): 44 min  Charges: OT General Charges $OT Visit: 1 Visit OT Treatments $Self Care/Home Management : 38-52 mins  Torrin Crihfield, OTR/L [place]   Office [phone] Pager: [phone]    Lenward Chancellor 12/16/2019, 11:07 AM

## 2019-12-16 NOTE — Discharge Summary (Addendum)
Physician Discharge Summary   Patient ID: Tammy [name] MRN: [id number] DOB/AGE: Apr 21, 1931 86 y.o.  Admit date: 12/11/2019 Discharge date: 12/16/2019  Primary Care Physician:  [name], MD   Recommendations for Outpatient Follow-up:  1. Follow up with PCP in 1-2 weeks  Home Health: Patient accepted to skilled nursing facility Equipment/Devices:   Discharge Condition: stable CODE STATUS: FULL  Diet recommendation: Dysphagia 3 diet, mechanical soft with thin liquids.  Softer foods advised.   Discharge Diagnoses:    . Acute metabolic encephalopathy superimposed on dementia . Coronary artery disease involving native coronary artery of native heart with angina pectoris ([place]) . Dehydration, diarrhea . Essential tremor . Essential hypertension . Fall . Hyperlipidemia with target LDL less than 70 . Mild dementia ([place]) . PAF (paroxysmal atrial fibrillation) ([place]) . Pulmonary hypertension ([place]) . Elevated troponin Moderate protein calorie malnutrition  Consults: Speech evaluation    Allergies:   Allergies  Allergen Reactions  . Penicillins Hives    Has patient had a PCN reaction causing immediate rash, facial/tongue/throat swelling, SOB or lightheadedness with hypotension: No Has patient had a PCN reaction causing severe rash involving mucus membranes or skin necrosis: Yes Has patient had a PCN reaction that required hospitalization No Has patient had a PCN reaction occurring within the last 10 years: Yes took again last year 2016 If all of the above answers are "NO", then may proceed with Cephalosporin use.   . Naprosyn [Naproxen] Nausea Only    diarrhea  . Sertraline Diarrhea  . Lactose Intolerance (Gi) Other (See Comments)    Unknown- [facility] of [facility] documented     DISCHARGE MEDICATIONS: Allergies as of 12/17/2019      Reactions   Penicillins Hives   Has patient had a PCN reaction causing immediate rash, facial/tongue/throat swelling,  SOB or lightheadedness with hypotension: No Has patient had a PCN reaction causing severe rash involving mucus membranes or skin necrosis: Yes Has patient had a PCN reaction that required hospitalization No Has patient had a PCN reaction occurring within the last 10 years: Yes took again last year 2016 If all of the above answers are "NO", then may proceed with Cephalosporin use.   Naprosyn [naproxen] Nausea Only   diarrhea   Sertraline Diarrhea   Lactose Intolerance (gi) Other (See Comments)   Unknown- Home Health of [facility] documented      Medication List    STOP taking these medications   amLODipine 2.5 MG tablet Commonly known as: NORVASC   irbesartan 300 MG tablet Commonly known as: AVAPRO     TAKE these medications   acetaminophen 325 MG tablet Commonly known as: TYLENOL Take 2 tablets (650 mg total) by mouth every 6 (six) hours as needed for mild pain or moderate pain.   albuterol 108 (90 Base) MCG/ACT inhaler Commonly known as: VENTOLIN HFA INHALE 2 PUFFS BY MOUTH EVERY 4 TO 6 HOURS AS NEEDED FOR COUGH OR  WHEEZING What changed: See the new instructions.   aspirin 81 MG tablet Take 81 mg by mouth daily.   atorvastatin 10 MG tablet Commonly known as: LIPITOR Take 10 mg by mouth daily.   augmented betamethasone dipropionate 0.05 % ointment Commonly known as: DIPROLENE-AF Apply 1 application topically 2 (two) times daily.   Breo Ellipta 200-25 MCG/INH Aepb Generic drug: fluticasone furoate-vilanterol Inhale 1 puff into the lungs daily.   diclofenac Sodium 1 % Gel Commonly known as: VOLTAREN Apply 2 g topically 4 (four) times daily as needed (  pain along left buttock from fall).   donepezil 10 MG tablet Commonly known as: ARICEPT Take 1 tablet by mouth once daily   DULoxetine 60 MG capsule Commonly known as: CYMBALTA Take 60 mg by mouth daily.   folic acid 1 MG tablet Commonly known as: FOLVITE Take 1 tablet (1 mg total) by mouth daily.    furosemide 20 MG tablet Commonly known as: LASIX Take 1 tablet (20 mg total) by mouth daily as needed for fluid or edema. What changed:   when to take this  reasons to take this   methylPREDNISolone acetate 40 MG/ML injection Commonly known as: DEPO-MEDROL Inject 2 mg into the muscle once as needed (leg pain).   metoprolol tartrate 25 MG tablet Commonly known as: LOPRESSOR Take 1 tablet (25 mg total) by mouth 2 (two) times daily. Hold SBP<110, HR <60 What changed:   medication strength  how much to take  additional instructions   pantoprazole 40 MG tablet Commonly known as: Protonix Take 1 tablet (40 mg total) by mouth 2 (two) times daily.   pentoxifylline 400 MG CR tablet Commonly known as: TRENTAL Take 400 mg by mouth 2 (two) times daily.        Brief H and P: For complete details please refer to admission H and P, but in brief Patient is a 86 year old female with history of dementia, hypertension, CAD, pulmonary hypertension, COPD, occasionally on O2, A. fib on anticoagulation, essential tremor presented from home, found by daughter on the floor.  She lives at home alone, has dementia, possibly had been on the floor for 2 to 3 days.  Family became concerned when she was not answering her phone, found her on the floor covered in feces, unable to get up, had abrasions on bilateral legs.  Patient was unsure how she fell.  Reported being vaccinated against Covid.  Covid test negative.  Hospital Course:   Acute metabolic encephalopathy superimposed on dementia -Likely worsened due to dehydration, fall, rhabdomyolysis ruled out -UA showed ketones however no nitrite, no bacteria, no hypercarbia.  Nonfocal neurological exam, cooperative.  Ammonia level 10. -CT head showed no acute intracranial abnormality, age-related atrophy and chronic small vessel ischemic changes -Currently alert and oriented, appears to be at her baseline, awaiting skilled nursing facility, accepted for  DC on 12/17/2019   Fall: - possible syncopal episode vs mechanical fall  - PT evaluation recommended skilled nursing facility, lives alone, appears very deconditioned, patient accepted to SNF - 2D echo showed EF of 60 to 65%, no regional wall motion abnormalities   Diarrhea, dehydration No further diarrhea   CAD -Currently no chest pain or acute shortness of breath, continue aspirin, statin, beta-blocker  -2D echo with normal EF, no regional wall motion abnormalities   Essential tremor Chronic, continue beta-blocker  Essential hypertension -BP somewhat elevated, restart amlodipine, Avapro, continue beta-blocker  Hyperlipidemia -Continue statin  History of paroxysmal atrial fibrillation -Continue Lopressor for rate control and as BP allows -Not on anticoagulation due to high fall risk  History of pulmonary hypertension -Currently no acute issues  History of biliary dilatation, hepatic steatosis -No acute issues  Moderate protein calorie malnutrition with obesity -Albumin 2.9, dietitian consult Estimated body mass index is 27.7 kg/m as calculated from the following:   Height as of this encounter: 4\' 9"  (1.448 m).   Weight as of this encounter: 58.1 kg.  Day of Discharge: Above discharge note compiled and billed by Dr. Tana Coast from 8/1.Seen by me (Dr. Earnest Conroy) on the  day of discharge and following changes made:  Discharge to SNF rehab as planned today.  DC summary in place from Dr. Tana Coast. Nurse also reported blood pressure to be 126/60 early this morning before getting scheduled antihypertensives.  Patient was resumed on Norvasc 2.5 mg, metoprolol 25 mg twice daily upon admission.  Avapro 300 mg was added on 8/1 for transient hypotension.  Orthostatic check today revealed blood pressure 133/48 HR 75 sitting 114/58 HR 78 standing 114/51 HR 87 she couldn't stand for the 3 min test Recommend to continue metoprolol for now and hold off Norvasc in concern for orthostatic  change (which might have preceded her fall) and discontinue Avapro.  Can titrate metoprolol as needed at SNF. Voltaren gel for as needed use ordered in addition to prior discharge meds to address patient complains of left buttock musculoskeletal pain. Discussed with social worker and arrangements being made for transfer today.   Please note  You were cared for by a hospitalist during your hospital stay. Once you are discharged, your primary care physician will handle any further medical issues. Please note that NO REFILLS for any discharge medications will be authorized once you are discharged, as it is imperative that you return to your primary care physician (or establish a relationship with a primary care physician if you do not have one) for your aftercare needs so that they can reassess your need for medications and monitor your lab values.   The results of significant diagnostics from this hospitalization (including imaging, microbiology, ancillary and laboratory) are listed below for reference.      Procedures/Studies:  DG Chest 1 View  Result Date: 12/11/2019 CLINICAL DATA:  Fall at home. EXAM: CHEST  1 VIEW COMPARISON:  Chest radiograph 02/22/2018 FINDINGS: Median sternotomy and CABG.The cardiomediastinal contours are normal. Upper normal heart size with aortic atherosclerosis. The lungs are clear. Pulmonary vasculature is normal. No consolidation, pleural effusion, or pneumothorax. No acute osseous abnormalities are seen. Bones appear under mineralized. IMPRESSION: 1. No acute chest findings. 2. Median sternotomy and CABG. Aortic Atherosclerosis (ICD10-I70.0). Electronically Signed   By: Keith Rake M.D.   On: 12/11/2019 21:07   DG Pelvis 1-2 Views  Result Date: 12/11/2019 CLINICAL DATA:  Fall at home. EXAM: PELVIS - 1-2 VIEW COMPARISON:  Pelvis radiograph 02/20/2016. FINDINGS: The cortical margins of the bony pelvis are intact. No fracture. Pubic symphysis and sacroiliac joints  are congruent. Age related degenerative change of both hips and the sacroiliac joints. Both femoral heads are well-seated in the respective acetabula. Bones diffusely under mineralized. IMPRESSION: No acute pelvic fracture. Electronically Signed   By: Keith Rake M.D.   On: 12/11/2019 21:07   DG Tibia/Fibula Right  Result Date: 12/11/2019 CLINICAL DATA:  Fall at home with bruising to right lower leg. EXAM: RIGHT TIBIA AND FIBULA - 2 VIEW COMPARISON:  None. FINDINGS: Cortical margins of the tibia and fibular intact. There is no evidence of fracture or other focal bone lesions. Knee and ankle alignment are maintained. The bones are under mineralized. Multiple surgical clips in the medial soft tissues. Soft tissues are unremarkable. IMPRESSION: No fracture of the right lower leg. Electronically Signed   By: Keith Rake M.D.   On: 12/11/2019 21:03   CT Head Wo Contrast  Result Date: 12/11/2019 CLINICAL DATA:  Change in mental status EXAM: CT HEAD WITHOUT CONTRAST TECHNIQUE: Contiguous axial images were obtained from the base of the skull through the vertex without intravenous contrast. COMPARISON:  February 20, 2016 FINDINGS: Brain:  No evidence of acute territorial infarction, hemorrhage, hydrocephalus,extra-axial collection or mass lesion/mass effect. There is dilatation the ventricles and sulci consistent with age-related atrophy. Low-attenuation changes in the deep white matter consistent with small vessel ischemia. Vascular: No hyperdense vessel or unexpected calcification. Skull: The skull is intact. No fracture or focal lesion identified. Sinuses/Orbits: The visualized paranasal sinuses and mastoid air cells are clear. The orbits and globes intact. Other: None Cervical spine: Alignment: There is a grade 1 anterolisthesis of C3 on C4 measuring 3 mm. There is a minimal anterolisthesis of C4 on C5. Skull base and vertebrae: Visualized skull base is intact. No atlanto-occipital dissociation. The  vertebral body heights are well maintained. No fracture or pathologic osseous lesion seen. Soft tissues and spinal canal: The visualized paraspinal soft tissues are unremarkable. No prevertebral soft tissue swelling is seen. The spinal canal is grossly unremarkable, no large epidural collection or significant canal narrowing. Disc levels: Cervical spine spondylosis is seen with disc osteophyte complex and uncovertebral osteophytes most notable at C4-C5 with moderate neural foraminal narrowing and central canal stenosis. Upper chest: Mild centrilobular emphysematous changes seen at both lung apices. Scattered carotid artery calcifications are noted. Thoracic inlet is within normal limits. Other: None IMPRESSION: No acute intracranial abnormality. Findings consistent with age related atrophy and chronic small vessel ischemia No acute fracture or malalignment of the spine. Emphysema (ICD10-J43.9). Electronically Signed   By: Prudencio Pair M.D.   On: 12/11/2019 21:09   CT Cervical Spine Wo Contrast  Result Date: 12/11/2019 CLINICAL DATA:  Change in mental status EXAM: CT HEAD WITHOUT CONTRAST TECHNIQUE: Contiguous axial images were obtained from the base of the skull through the vertex without intravenous contrast. COMPARISON:  February 20, 2016 FINDINGS: Brain: No evidence of acute territorial infarction, hemorrhage, hydrocephalus,extra-axial collection or mass lesion/mass effect. There is dilatation the ventricles and sulci consistent with age-related atrophy. Low-attenuation changes in the deep white matter consistent with small vessel ischemia. Vascular: No hyperdense vessel or unexpected calcification. Skull: The skull is intact. No fracture or focal lesion identified. Sinuses/Orbits: The visualized paranasal sinuses and mastoid air cells are clear. The orbits and globes intact. Other: None Cervical spine: Alignment: There is a grade 1 anterolisthesis of C3 on C4 measuring 3 mm. There is a minimal anterolisthesis  of C4 on C5. Skull base and vertebrae: Visualized skull base is intact. No atlanto-occipital dissociation. The vertebral body heights are well maintained. No fracture or pathologic osseous lesion seen. Soft tissues and spinal canal: The visualized paraspinal soft tissues are unremarkable. No prevertebral soft tissue swelling is seen. The spinal canal is grossly unremarkable, no large epidural collection or significant canal narrowing. Disc levels: Cervical spine spondylosis is seen with disc osteophyte complex and uncovertebral osteophytes most notable at C4-C5 with moderate neural foraminal narrowing and central canal stenosis. Upper chest: Mild centrilobular emphysematous changes seen at both lung apices. Scattered carotid artery calcifications are noted. Thoracic inlet is within normal limits. Other: None IMPRESSION: No acute intracranial abnormality. Findings consistent with age related atrophy and chronic small vessel ischemia No acute fracture or malalignment of the spine. Emphysema (ICD10-J43.9). Electronically Signed   By: Prudencio Pair M.D.   On: 12/11/2019 21:09   US Abdomen Complete  Result Date: 11/26/2019 CLINICAL DATA:  Abdominal pain EXAM: ABDOMEN ULTRASOUND COMPLETE COMPARISON:  CT abdomen and pelvis December 08, 2016 FINDINGS: Gallbladder: Within the gallbladder, there are multiple echogenic foci which move and shadow consistent with cholelithiasis. Largest gallstone measures 1.6 cm in length. There is no  appreciable gallbladder wall thickening or pericholecystic fluid. No sonographic Murphy sign noted by sonographer. Common bile duct: Diameter: 10 mm, prominent. No intrahepatic biliary duct dilatation evident. Note that much of the common bile duct is obscured by gas. Liver: No focal lesion identified. Liver echogenicity overall is increased. Portal vein is patent on color Doppler imaging with normal direction of blood flow towards the liver. IVC: No abnormality visualized. Pancreas: No pancreatic  mass or inflammatory focus. Spleen: Size and appearance within normal limits. Right Kidney: Length: 9.4 cm. Echogenicity within normal limits. No mass or hydronephrosis visualized. Left Kidney: Length: 10.7 cm. Echogenicity within normal limits. No mass or hydronephrosis visualized. Abdominal aorta: No aneurysm visualized. Other findings: No evident ascites. IMPRESSION: 1. Cholelithiasis. No gallbladder wall thickening or pericholecystic fluid. 2. Prominence of the proximal common bile duct. Much of the common bile duct obscured by gas. No appreciable intrahepatic biliary duct dilatation seen currently. Note that there was biliary duct dilatation on prior CT. From an imaging standpoint, MRCP would be the optimum study of choice to further evaluate the biliary ductal system. 3. Diffuse increase in liver echogenicity, a finding indicative of hepatic steatosis. While no focal liver lesions are evident on this study by ultrasound, it must be cautioned that the sensitivity of ultrasound for detection of focal liver lesions is diminished in this circumstance. 4.  Study otherwise unremarkable. Electronically Signed   By: Lowella Grip III M.D.   On: 11/26/2019 13:57   DG Knee Complete 4 Views Left  Result Date: 12/11/2019 CLINICAL DATA:  Fall at home.  Bruising to left knee. EXAM: LEFT KNEE - COMPLETE 4+ VIEW COMPARISON:  None. FINDINGS: No evidence of fracture, dislocation, or joint effusion. Minor tricompartmental peripheral spurring most prominent in the patellofemoral compartment. Small quadriceps tendon enthesophyte. Bony under mineralization. Soft tissues are unremarkable. IMPRESSION: 1. No acute fracture or subluxation of the left knee. 2. Mild tricompartmental osteoarthritis. Electronically Signed   By: Keith Rake M.D.   On: 12/11/2019 21:02   ECHOCARDIOGRAM COMPLETE  Result Date: 12/12/2019    ECHOCARDIOGRAM REPORT   Patient Name:   Tammy [name] Date of Exam: 12/12/2019 Medical Rec #:  [id number]      Height:       57.0 in Accession #:    [id number]    Weight:       143.5 lb Date of Birth:  December 15, 1930     BSA:          1.562 m Patient Age:    74 years      BP:           128/68 mmHg Patient Gender: F             HR:           87 bpm. Exam Location:  Inpatient Procedure: 2D Echo Indications:    syncope R55  History:        Patient has prior history of Echocardiogram examinations, most                 recent 07/19/2019. CAD, Prior CABG; Risk Factors:Hypertension and                 Dyslipidemia.  Sonographer:    Mikki Santee RDCS (AE) Referring Phys: [place]  1. Small intracavitary gradient noted. Peak velocity 0.98 m/s. Peak gradient 3.8 mmHg. Left ventricular ejection fraction, by estimation, is 60 to 65%. The left ventricle has normal function. The left ventricle has  no regional wall motion abnormalities.  There is mild concentric left ventricular hypertrophy. Left ventricular diastolic parameters are consistent with Grade I diastolic dysfunction (impaired relaxation). Elevated left ventricular end-diastolic pressure.  2. Right ventricular systolic function is normal. The right ventricular size is normal. There is normal pulmonary artery systolic pressure.  3. The mitral valve is normal in structure. No evidence of mitral valve regurgitation. No evidence of mitral stenosis.  4. The aortic valve is tricuspid. Aortic valve regurgitation is not visualized. No aortic stenosis is present.  5. The inferior vena cava is normal in size with greater than 50% respiratory variability, suggesting right atrial pressure of 3 mmHg. FINDINGS  Left Ventricle: Small intracavitary gradient noted. Peak velocity 0.98 m/s. Peak gradient 3.8 mmHg. Left ventricular ejection fraction, by estimation, is 60 to 65%. The left ventricle has normal function. The left ventricle has no regional wall motion abnormalities. The left ventricular internal cavity size was normal in size. There is mild concentric left  ventricular hypertrophy. Left ventricular diastolic parameters are consistent with Grade I diastolic dysfunction (impaired relaxation). Elevated left ventricular end-diastolic pressure. Right Ventricle: The right ventricular size is normal. No increase in right ventricular wall thickness. Right ventricular systolic function is normal. There is normal pulmonary artery systolic pressure. The tricuspid regurgitant velocity is 2.76 m/s, and  with an assumed right atrial pressure of 3 mmHg, the estimated right ventricular systolic pressure is 02.7 mmHg. Left Atrium: Left atrial size was normal in size. Right Atrium: Right atrial size was normal in size. Pericardium: There is no evidence of pericardial effusion. Mitral Valve: The mitral valve is normal in structure. Normal mobility of the mitral valve leaflets. No evidence of mitral valve regurgitation. No evidence of mitral valve stenosis. Tricuspid Valve: The tricuspid valve is normal in structure. Tricuspid valve regurgitation is trivial. No evidence of tricuspid stenosis. Aortic Valve: The aortic valve is tricuspid. Aortic valve regurgitation is not visualized. No aortic stenosis is present. Pulmonic Valve: The pulmonic valve was normal in structure. Pulmonic valve regurgitation is not visualized. No evidence of pulmonic stenosis. Aorta: The aortic root is normal in size and structure. Venous: The inferior vena cava is normal in size with greater than 50% respiratory variability, suggesting right atrial pressure of 3 mmHg. IAS/Shunts: No atrial level shunt detected by color flow Doppler.  LEFT VENTRICLE PLAX 2D LVIDd:         3.61 cm  Diastology LVIDs:         2.45 cm  LV e' lateral:   5.40 cm/s LV PW:         1.19 cm  LV E/e' lateral: 14.2 LV IVS:        1.18 cm  LV e' medial:    4.65 cm/s LVOT diam:     2.00 cm  LV E/e' medial:  16.5 LV SV:         75 LV SV Index:   48 LVOT Area:     3.14 cm  RIGHT VENTRICLE RV S prime:     12.60 cm/s TAPSE (M-mode): 1.4 cm LEFT  ATRIUM             Index       RIGHT ATRIUM           Index LA diam:        3.20 cm 2.05 cm/m  RA Area:     14.20 cm LA Vol (A2C):   39.1 ml 25.03 ml/m RA Volume:   31.20 ml  19.97 ml/m LA Vol (A4C):   40.7 ml 26.06 ml/m LA Biplane Vol: 41.5 ml 26.57 ml/m  AORTIC VALVE LVOT Vmax:   100.00 cm/s LVOT Vmean:  68.500 cm/s LVOT VTI:    0.238 m  AORTA Ao Root diam: 2.60 cm MITRAL VALVE                TRICUSPID VALVE MV Area (PHT): 2.60 cm     TR Peak grad:   30.5 mmHg MV Decel Time: 292 msec     TR Vmax:        276.00 cm/s MV E velocity: 76.50 cm/s MV A velocity: 119.00 cm/s  SHUNTS MV E/A ratio:  0.64         Systemic VTI:  0.24 m                             Systemic Diam: 2.00 cm Skeet Latch MD Electronically signed by Skeet Latch MD Signature Date/Time: 12/12/2019/4:32:19 PM    Final    VAS US CAROTID  Result Date: 12/12/2019 Carotid Arterial Duplex Study Indications:       Syncope. Risk Factors:      Hypertension, hyperlipidemia. Limitations        Today's exam was limited due to heavy calcification and the                    resulting shadowing and the patient's respiratory variation. Comparison Study:  No prior studies. Performing Technologist: Oliver Hum RVT  Examination Guidelines: A complete evaluation includes B-mode imaging, spectral Doppler, color Doppler, and power Doppler as needed of all accessible portions of each vessel. Bilateral testing is considered an integral part of a complete examination. Limited examinations for reoccurring indications may be performed as noted.  Right Carotid Findings: +----------+--------+--------+--------+--------------------------+--------+           PSV cm/sEDV cm/sStenosisPlaque Description        Comments +----------+--------+--------+--------+--------------------------+--------+ CCA Prox  104     9               smooth and heterogenous            +----------+--------+--------+--------+--------------------------+--------+ CCA  Distal59      9               irregular and heterogenous         +----------+--------+--------+--------+--------------------------+--------+ ICA Prox  97      16              calcific                  tortuous +----------+--------+--------+--------+--------------------------+--------+ ICA Distal80      13                                        tortuous +----------+--------+--------+--------+--------------------------+--------+ ECA       112                                                        +----------+--------+--------+--------+--------------------------+--------+ +----------+--------+-------+--------+-------------------+           PSV cm/sEDV cmsDescribeArm Pressure (mmHG) +----------+--------+-------+--------+-------------------+ [id number]                                        +----------+--------+-------+--------+-------------------+ +---------+--------+--+--------+-+---------+  VertebralPSV cm/s49EDV cm/s8Antegrade +---------+--------+--+--------+-+---------+  Left Carotid Findings: +----------+--------+--------+--------+-----------------------+--------+           PSV cm/sEDV cm/sStenosisPlaque Description     Comments +----------+--------+--------+--------+-----------------------+--------+ CCA Prox  100     11              smooth and heterogenous         +----------+--------+--------+--------+-----------------------+--------+ CCA Distal70      12              smooth and heterogenous         +----------+--------+--------+--------+-----------------------+--------+ ICA Prox  110     24              calcific                        +----------+--------+--------+--------+-----------------------+--------+ ICA Distal131     25                                     tortuous +----------+--------+--------+--------+-----------------------+--------+ ECA       201     10                                               +----------+--------+--------+--------+-----------------------+--------+ +----------+--------+--------+--------+-------------------+           PSV cm/sEDV cm/sDescribeArm Pressure (mmHG) +----------+--------+--------+--------+-------------------+ [id number]                                         +----------+--------+--------+--------+-------------------+ +---------+--------+--+--------+-+---------+ VertebralPSV cm/s52EDV cm/s9Antegrade +---------+--------+--+--------+-+---------+   Summary: Right Carotid: Velocities in the right ICA are consistent with a 1-39% stenosis. Left Carotid: Velocities in the left ICA are consistent with a 1-39% stenosis. Vertebrals: Bilateral vertebral arteries demonstrate antegrade flow. *See table(s) above for measurements and observations.  Electronically signed by Antony Contras MD on 12/12/2019 at 4:32:16 PM.    Final       LAB RESULTS: Basic Metabolic Panel: Recent Labs  Lab 12/12/19 0445 12/13/19 0319 12/14/19 0307 12/15/19 0346  NA 142   < > 140 141  K 3.7   < > 3.9 3.8  CL 112*   < > 109 106  CO2 20*   < > 23 25  GLUCOSE 111*   < > 104* 102*  BUN 25*   < > 20 20  CREATININE 0.81   < > 0.82 0.78  CALCIUM 8.2*   < > 8.4* 9.1  MG 2.2  --   --   --   PHOS 2.8  --   --   --    < > = values in this interval not displayed.   Liver Function Tests: Recent Labs  Lab 12/11/19 1929 12/12/19 0445  AST 35 37  ALT 34 30  ALKPHOS 67 59  BILITOT 1.0 0.7  PROT 6.5 5.9*  ALBUMIN 3.4* 2.9*   No results for input(s): LIPASE, AMYLASE in the last 168 hours. Recent Labs  Lab 12/11/19 2238  AMMONIA 10   CBC: Recent Labs  Lab 12/12/19 0445 12/13/19 0319 12/14/19 0307 12/14/19 0307 12/15/19 0346  WBC 12.0*   < > 9.3  --  10.6*  NEUTROABS 9.4*  --   --   --   --  HGB 11.3*   < > 11.1*  --  11.4*  HCT 36.0   < > 34.9*  --  35.8*  MCV 92.1   < > 89.9   < > 90.2  PLT 176   < > 188  --  205   < > = values in this interval not  displayed.   Cardiac Enzymes: Recent Labs  Lab 12/11/19 1929 12/12/19 1701  CKTOTAL 310* 178   BNP: Invalid input(s): POCBNP CBG: No results for input(s): GLUCAP in the last 168 hours.     Disposition and Follow-up: Discharge Instructions    Diet - low sodium heart healthy   Complete by: As directed    Increase activity slowly   Complete by: As directed        DISPOSITION: [place], Charles Alan, MD. Schedule an appointment as soon as possible for a visit in 2 week(s).   Specialty: Family Medicine Contact information: [place] [address] [phone]        [name], MD .   Specialty: Cardiology Contact information: [address] [place] [address] [phone]                Time coordinating discharge:  35 minutes  Signed:   Guilford Shi M.D. [facility] 12/17/2019, 10:34 AM

## 2019-12-16 NOTE — TOC Progression Note (Signed)
Transition of Care [facility]) - Progression Note    Patient Details  Name: Tammy [name] MRN: [id number] Date of Birth: 1930-09-24  [facility] CM/SW Contact  Ross Ludwig, Loop Phone Number: 12/16/2019, 12:16 PM  Clinical Narrative:     CSW spoke to [facility] they are not able to accept patient today.  They can accept patient tomorrow, CSW updated physician and bedside nurse.   Expected Discharge Plan: [place] Barriers to Discharge: SNF Pending bed offer  Expected Discharge Plan and Services Expected Discharge Plan: [place] In-house Referral: Clinical Social Work Discharge Planning Services: CM Consult Post Acute Care Choice: [place] arrangements for the past 2 months: Single Family Home Expected Discharge Date: 12/17/19                                     Social Determinants of Health (SDOH) Interventions    Readmission Risk Interventions No flowsheet data found.

## 2019-12-17 DIAGNOSIS — K219 Gastro-esophageal reflux disease without esophagitis: Secondary | ICD-10-CM | POA: Diagnosis not present

## 2019-12-17 DIAGNOSIS — I10 Essential (primary) hypertension: Secondary | ICD-10-CM | POA: Diagnosis not present

## 2019-12-17 DIAGNOSIS — R410 Disorientation, unspecified: Secondary | ICD-10-CM | POA: Diagnosis not present

## 2019-12-17 DIAGNOSIS — R2681 Unsteadiness on feet: Secondary | ICD-10-CM | POA: Diagnosis not present

## 2019-12-17 DIAGNOSIS — Z03818 Encounter for observation for suspected exposure to other biological agents ruled out: Secondary | ICD-10-CM | POA: Diagnosis not present

## 2019-12-17 DIAGNOSIS — Z9181 History of falling: Secondary | ICD-10-CM | POA: Diagnosis not present

## 2019-12-17 DIAGNOSIS — J439 Emphysema, unspecified: Secondary | ICD-10-CM | POA: Diagnosis not present

## 2019-12-17 DIAGNOSIS — M6281 Muscle weakness (generalized): Secondary | ICD-10-CM | POA: Diagnosis not present

## 2019-12-17 DIAGNOSIS — W19XXXA Unspecified fall, initial encounter: Secondary | ICD-10-CM | POA: Diagnosis not present

## 2019-12-17 DIAGNOSIS — F0390 Unspecified dementia without behavioral disturbance: Secondary | ICD-10-CM | POA: Diagnosis not present

## 2019-12-17 DIAGNOSIS — I480 Paroxysmal atrial fibrillation: Secondary | ICD-10-CM | POA: Diagnosis not present

## 2019-12-17 DIAGNOSIS — M533 Sacrococcygeal disorders, not elsewhere classified: Secondary | ICD-10-CM | POA: Diagnosis not present

## 2019-12-17 DIAGNOSIS — F324 Major depressive disorder, single episode, in partial remission: Secondary | ICD-10-CM | POA: Diagnosis not present

## 2019-12-17 DIAGNOSIS — E782 Mixed hyperlipidemia: Secondary | ICD-10-CM | POA: Diagnosis not present

## 2019-12-17 DIAGNOSIS — I2583 Coronary atherosclerosis due to lipid rich plaque: Secondary | ICD-10-CM | POA: Diagnosis not present

## 2019-12-17 DIAGNOSIS — M25552 Pain in left hip: Secondary | ICD-10-CM | POA: Diagnosis not present

## 2019-12-17 DIAGNOSIS — R600 Localized edema: Secondary | ICD-10-CM | POA: Diagnosis not present

## 2019-12-17 DIAGNOSIS — M545 Low back pain: Secondary | ICD-10-CM | POA: Diagnosis not present

## 2019-12-17 DIAGNOSIS — G9340 Encephalopathy, unspecified: Secondary | ICD-10-CM | POA: Diagnosis not present

## 2019-12-17 DIAGNOSIS — Z8739 Personal history of other diseases of the musculoskeletal system and connective tissue: Secondary | ICD-10-CM | POA: Diagnosis not present

## 2019-12-17 DIAGNOSIS — M5136 Other intervertebral disc degeneration, lumbar region: Secondary | ICD-10-CM | POA: Diagnosis not present

## 2019-12-17 DIAGNOSIS — I2510 Atherosclerotic heart disease of native coronary artery without angina pectoris: Secondary | ICD-10-CM | POA: Diagnosis not present

## 2019-12-17 DIAGNOSIS — M810 Age-related osteoporosis without current pathological fracture: Secondary | ICD-10-CM | POA: Diagnosis not present

## 2019-12-17 DIAGNOSIS — I69828 Other speech and language deficits following other cerebrovascular disease: Secondary | ICD-10-CM | POA: Diagnosis not present

## 2019-12-17 DIAGNOSIS — Z789 Other specified health status: Secondary | ICD-10-CM | POA: Diagnosis not present

## 2019-12-17 DIAGNOSIS — D649 Anemia, unspecified: Secondary | ICD-10-CM | POA: Diagnosis not present

## 2019-12-17 DIAGNOSIS — S32512D Fracture of superior rim of left pubis, subsequent encounter for fracture with routine healing: Secondary | ICD-10-CM | POA: Diagnosis not present

## 2019-12-17 DIAGNOSIS — I272 Pulmonary hypertension, unspecified: Secondary | ICD-10-CM | POA: Diagnosis not present

## 2019-12-17 DIAGNOSIS — R1032 Left lower quadrant pain: Secondary | ICD-10-CM | POA: Diagnosis not present

## 2019-12-17 DIAGNOSIS — R296 Repeated falls: Secondary | ICD-10-CM | POA: Diagnosis not present

## 2019-12-17 DIAGNOSIS — I5032 Chronic diastolic (congestive) heart failure: Secondary | ICD-10-CM | POA: Diagnosis not present

## 2019-12-17 DIAGNOSIS — G9341 Metabolic encephalopathy: Secondary | ICD-10-CM | POA: Diagnosis not present

## 2019-12-17 DIAGNOSIS — E440 Moderate protein-calorie malnutrition: Secondary | ICD-10-CM | POA: Diagnosis not present

## 2019-12-17 DIAGNOSIS — G250 Essential tremor: Secondary | ICD-10-CM | POA: Diagnosis not present

## 2019-12-17 DIAGNOSIS — M47896 Other spondylosis, lumbar region: Secondary | ICD-10-CM | POA: Diagnosis not present

## 2019-12-17 DIAGNOSIS — I701 Atherosclerosis of renal artery: Secondary | ICD-10-CM | POA: Diagnosis not present

## 2019-12-17 DIAGNOSIS — R41841 Cognitive communication deficit: Secondary | ICD-10-CM | POA: Diagnosis not present

## 2019-12-17 DIAGNOSIS — E785 Hyperlipidemia, unspecified: Secondary | ICD-10-CM | POA: Diagnosis not present

## 2019-12-17 DIAGNOSIS — I25119 Atherosclerotic heart disease of native coronary artery with unspecified angina pectoris: Secondary | ICD-10-CM | POA: Diagnosis not present

## 2019-12-17 DIAGNOSIS — N183 Chronic kidney disease, stage 3 unspecified: Secondary | ICD-10-CM | POA: Diagnosis not present

## 2019-12-17 DIAGNOSIS — J4540 Moderate persistent asthma, uncomplicated: Secondary | ICD-10-CM | POA: Diagnosis not present

## 2019-12-17 DIAGNOSIS — J449 Chronic obstructive pulmonary disease, unspecified: Secondary | ICD-10-CM | POA: Diagnosis not present

## 2019-12-17 DIAGNOSIS — J069 Acute upper respiratory infection, unspecified: Secondary | ICD-10-CM | POA: Diagnosis not present

## 2019-12-17 DIAGNOSIS — R1312 Dysphagia, oropharyngeal phase: Secondary | ICD-10-CM | POA: Diagnosis not present

## 2019-12-17 MED ORDER — DICLOFENAC SODIUM 1 % EX GEL: 2.0000 g | Freq: Four times a day (QID) | CUTANEOUS | Status: DC | PRN

## 2019-12-17 MED ORDER — METOPROLOL TARTRATE 25 MG PO TABS: 25.0000 mg | ORAL_TABLET | Freq: Two times a day (BID) | ORAL | Status: DC

## 2019-12-17 MED ORDER — DICLOFENAC SODIUM 1 % EX GEL
2.0000 g | Freq: Four times a day (QID) | CUTANEOUS | Status: DC | PRN
  Filled 2019-12-17: qty 100

## 2019-12-17 NOTE — Progress Notes (Addendum)
Please see discharge summary from yesterday from Dr. Tana Coast Patient awake alert and tolerating diet.  She is oriented to place and person, states does not keep up with time.  Denies any pain except along left buttock area-possibly from fall prior to admission.  Otherwise in good spirits, communicates appropriately and is aware of discharge plan to SNF rehab.  Afebrile, pulse 66, respiratory rate 16, BP 136/53, O2 sat 96% on room air  General: Pleasant, appropriate in conversation, no acute distress Heart: S1-S2 heard, regular rate and rhythm, no murmurs.  No leg edema noted Lungs: Equal air entry bilaterally, no rhonchi or rales on exam, no accessory muscle use Abdomen: Bowel sounds heard, soft, nontender, nondistended. No organomegaly.  No CVA tenderness. Extremities: No pedal edema.  No cyanosis or clubbing. Neurological: Awake alert oriented x2, no focal weakness   Last labs from 7/31:  Sodium 141, potassium 3.8, BUN 20, creatinine 0.78 WBC 10.6, hemoglobin 11.4, platelet 205  Assessment/plan:  Discharge to SNF rehab as planned.  DC summary in place. Nurse also reported blood pressure to be 126/60 early this morning before getting scheduled antihypertensives.  Patient was resumed on Norvasc 2.5 mg, metoprolol 25 mg twice daily upon admission.  Avapro 300 mg was added on 8/1 for transient hypotension.  Orthostatic check today revealed blood pressure 133/48 HR 75 sitting 114/58 HR 78 standing 114/51 HR 87 she couldn't stand for the 3 min test Recommend to continue metoprolol for now and hold off Norvasc in concern for orthostatic change (which might have preceded her fall) and discontinue Avapro.  Can titrate metoprolol as needed at SNF. Voltaren gel for as needed use ordered in addition to prior discharge meds. Discussed with Education officer, museum.  Time spent: 25 minutes

## 2019-12-17 NOTE — Progress Notes (Signed)
Gave report to Ann at [place].

## 2019-12-17 NOTE — Progress Notes (Signed)
Went over discharge instructions w/ patient and patient daughter Jackelyn Poling.

## 2019-12-17 NOTE — TOC Transition Note (Signed)
Transition of Care [facility]) - CM/SW Discharge Note   Patient Details  Name: Tammy [name] MRN: [id number] Date of Birth: 1931-02-03  [facility]) CM/SW Contact:  [name], LCSW Phone Number: 12/17/2019, 2:13 PM   Clinical Narrative:  Patient to transfer to to [facility] rehab today.  Daughter will provide transportation as patient was denied by insurance for that piece.  Nursing, please call report to [phone], room 100.  TOC sign off.     Final next level of care: Skilled Nursing Facility Barriers to Discharge: Barriers Resolved   Patient Goals and CMS Choice     Choice offered to / list presented to : Patient, Adult Children  Discharge Placement                       Discharge Plan and Services In-house Referral: Clinical Social Work Discharge Planning Services: CM Consult Post Acute Care Choice: [place]                               Social Determinants of Health (SDOH) Interventions     Readmission Risk Interventions No flowsheet data found.

## 2019-12-17 NOTE — Care Management Important Message (Signed)
Important Message  Patient Details IM Letter given to the Patient Name: MELISA DONOFRIO MRN: [id number] Date of Birth: [birth year]   Medicare Important Message Given:  Yes     Kerin Salen 12/17/2019, 12:58 PM

## 2019-12-18 ENCOUNTER — Encounter: Payer: Self-pay | Admitting: Internal Medicine

## 2019-12-18 ENCOUNTER — Non-Acute Institutional Stay (SKILLED_NURSING_FACILITY): Payer: PPO | Admitting: Internal Medicine

## 2019-12-18 DIAGNOSIS — F0390 Unspecified dementia without behavioral disturbance: Secondary | ICD-10-CM | POA: Diagnosis not present

## 2019-12-18 DIAGNOSIS — Z8739 Personal history of other diseases of the musculoskeletal system and connective tissue: Secondary | ICD-10-CM | POA: Diagnosis not present

## 2019-12-18 DIAGNOSIS — I2583 Coronary atherosclerosis due to lipid rich plaque: Secondary | ICD-10-CM

## 2019-12-18 DIAGNOSIS — I5032 Chronic diastolic (congestive) heart failure: Secondary | ICD-10-CM | POA: Insufficient documentation

## 2019-12-18 DIAGNOSIS — R296 Repeated falls: Secondary | ICD-10-CM

## 2019-12-18 DIAGNOSIS — I2510 Atherosclerotic heart disease of native coronary artery without angina pectoris: Secondary | ICD-10-CM

## 2019-12-18 DIAGNOSIS — E440 Moderate protein-calorie malnutrition: Secondary | ICD-10-CM | POA: Diagnosis not present

## 2019-12-18 DIAGNOSIS — Z789 Other specified health status: Secondary | ICD-10-CM | POA: Diagnosis not present

## 2019-12-18 DIAGNOSIS — I10 Essential (primary) hypertension: Secondary | ICD-10-CM

## 2019-12-18 DIAGNOSIS — R1032 Left lower quadrant pain: Secondary | ICD-10-CM | POA: Diagnosis not present

## 2019-12-18 DIAGNOSIS — I480 Paroxysmal atrial fibrillation: Secondary | ICD-10-CM | POA: Diagnosis not present

## 2019-12-18 DIAGNOSIS — R410 Disorientation, unspecified: Secondary | ICD-10-CM

## 2019-12-18 NOTE — Progress Notes (Signed)
Provider:  Rexene Edison. Tammy [name], D.O., C.M.D. Location:  [place] Room Number: 100 Place of Service:  SNF ([id number])  PCP: [name], MD Patient Care Team: [name], MD as PCP - General (Family Medicine) [name], MD as PCP - Cardiology (Cardiology)  Extended Emergency Contact Information Primary Emergency Contact: [name],[name] Address: [place] [address] [place] Phone: [phone] Relation: Daughter  Code Status: full code Goals of Care: Advanced Directive information Advanced Directives 12/18/2019  Does Patient Have a Medical Advance Directive? No  Type of Advance Directive -  Does patient want to make changes to medical advance directive? -  Copy of [place] in Chart? -  Would patient like information on creating a medical advance directive? No - Patient declined   Chief Complaint  Patient presents with  . New Admit To SNF    New Admit  to SNF     HPI: Patient is a 86 y.o. female with a past medical history significant for dementia, hypertension, coronary artery disease, pulmonary hypertension, COPD on occasional oxygen, atrial fibrillation on anticoagulation, alcohol abuse, and essential tremor seen today for admission to [facility] status post hospitalization at [facility] from July 27 through August 2.  She had presented to the emergency room after she was found by her daughter on the home of her home where she was living alone.  It was thought she may have been there for 2 days.  Family had gotten concerned because she was not answering the phone.  She was found unfortunately, on the floor covered in feces and unable to get up.  She had abrasions on bilateral legs.  She had no recollection of how she fell.    She is on a statin for cholesterol.  She takes metoprolol and Norvasc for hypertension.  For chronic diastolic heart failure she had an  echocardiogram last in March which revealed diastolic dysfunction but a preserved ejection fraction.  She is on Lasix.  She has a history of coronary artery disease maintained on aspirin, statin, beta-blocker, and Plavix.  She had a CABG in 1994.  She is followed by cardiology.  She has had peripheral vascular disease on [place].  For her COPD she sometimes wears oxygen.  For atrial fibrillation she is rate controlled on metoprolol.  She has a CHA2DS2-VASc score of 5 but has not been on anticoagulation due to her fall risk.  Dementia is maintained on Aricept.  He had been living alone.  She has mild chronic anemia with a hemoglobin in the 11 range.  She was admitted to [facility] for work-up of acute delirium.  She did have leukocytosis to 12.4.  She had mild tachycardia and mild elevation of her troponin.  She did have elevated CK.  EKG was unchanged.  Her neuro exam was nonfocal however she did not fully cooperate.  CT head was negative for acute disease.  Her mentation had returned to baseline by the time of discharge.  She had a recent ultrasound done due to elevated liver function tests.  She had some biliary ductal dilatation in the past with plans for outpatient MRCP.  LFTs were stable during hospitalization.  She had some diarrhea and Aricept was stopped.  Gastric panel and C. difficile were checked. Aricept was back on the list at the time of d/c.  She was discharged here  on a dysphagia 3 diet with mechanical soft with thin liquids after being seen by speech therapy.  Softer foods were advised.  Metoprolol was increased to 25mg  po bid, but she could not tolerate addition of irbesartan and amlodipine back to her regimen due to orthostatic hypotension.    She was having left buttock pain for which voltaren gel was recommended.  She has had her Covid vaccines from [employer] January 25 and February 15.  Past Medical History:  Diagnosis Date  . Acid reflux   . Asthma    Asthmatic  COPD  . Atrial fibrillation ([place]) 01/01/2016  . Coronary artery disease    Echo 07/27/2011   . Coronary artery stenosis    , High-grade left main  . Dementia ([place])   . Essential tremor 03/16/2018  . GERD (gastroesophageal reflux disease)   . Hyperlipidemia   . Hypertension   . IBS (irritable bowel syndrome)   . Psoriasis    Past Surgical History:  Procedure Laterality Date  . CARDIAC SURGERY    . CORONARY ARTERY BYPASS GRAFT  1994   After catheterization showed 90% ostial left main stenosis  . DOPPLER ECHOCARDIOGRAPHY     Study showed mild to moderate MR with moderate TR, mild to moderate pulmonary hypertension with an ejection fraction greater that 55%.   [name] EYE SURGERY    . Nuclear Perfusion  10/2010, 11/15/2012   Showed normal perfusion, No Ischemia or Infarction, Normal EF  . Renal Scan Duplex  04/09/2012   Showing greater than 50% diameter reduction in the celiac artery and SMA.. Right proximal 275 and Mid 152.  [name] ROTATOR CUFF REPAIR     bilateral  . Sleep Study  12/28/2006   AHI during total sleep time (3h 19 minutes) was 1.81/hr and during REM sleep at 17.78/hr. Mild sleep apnea during REM sleep.Oxygen staturated rate during REM and NREM was 93.0%.    Social History   Socioeconomic History  . Marital status: Divorced    Spouse name: Not on file  . Number of children: 1  . Years of education: 68  . Highest education level: Not on file  Occupational History  . Occupation: retired    Comment: retired  Tobacco Use  . Smoking status: Former Smoker    Packs/day: 1.00    Years: 18.00    Pack years: 18.00    Quit date: 11/08/1962    Years since quitting: 57.1  . Smokeless tobacco: Never Used  Substance and Sexual Activity  . Alcohol use: No    Alcohol/week: 0.0 standard drinks    Comment: h/o heavy use - 2 bottles of wine daily  . Drug use: No  . Sexual activity: Never  Other Topics Concern  . Not on file  Social History Narrative   Patient lives at home alone.     Patient is divorced.    Patient has one child.    Patient has a high school education.    Patient quit tobacco 40 years ago.    Patient drinks coffee and tea daily.    Social Determinants of Health   Financial Resource Strain:   . Difficulty of Paying Living Expenses:   Food Insecurity:   . Worried About Charity fundraiser in the Last Year:   . Arboriculturist in the Last Year:   Transportation Needs:   . Film/video editor (Medical):   [name] Lack of Transportation (Non-Medical):   Physical Activity:   . Days of Exercise  per Week:   . Minutes of Exercise per Session:   Stress:   . Feeling of Stress :   Social Connections:   . Frequency of Communication with Friends and Family:   . Frequency of Social Gatherings with Friends and Family:   . Attends Religious Services:   . Active Member of Clubs or Organizations:   . Attends Archivist Meetings:   [name] Marital Status:     reports that she quit smoking about 57 years ago. She has a 18.00 pack-year smoking history. She has never used smokeless tobacco. She reports that she does not drink alcohol and does not use drugs.  Functional Status Survey:    Family History  Problem Relation Age of Onset  . Heart disease Mother   . Hypertension Sister   . Colon cancer Brother   . Cancer Brother     Health Maintenance  Topic Date Due  . TETANUS/TDAP  Never done  . DEXA SCAN  Never done  . PNA vac Low Risk Adult (1 of 2 - PCV13) Never done  . INFLUENZA VACCINE  12/16/2019  . COVID-19 Vaccine  Completed    Allergies  Allergen Reactions  . Penicillins Hives    Has patient had a PCN reaction causing immediate rash, facial/tongue/throat swelling, SOB or lightheadedness with hypotension: No Has patient had a PCN reaction causing severe rash involving mucus membranes or skin necrosis: Yes Has patient had a PCN reaction that required hospitalization No Has patient had a PCN reaction occurring within the last 10 years: Yes  took again last year 2016 If all of the above answers are "NO", then may proceed with Cephalosporin use.   . Naprosyn [Naproxen] Nausea Only    diarrhea  . Sertraline Diarrhea  . Lactose Intolerance (Gi) Other (See Comments)    Unknown- [facility] documented    Outpatient Encounter Medications as of 12/18/2019  Medication Sig  . acetaminophen (TYLENOL) 325 MG tablet Take 2 tablets (650 mg total) by mouth every 6 (six) hours as needed for mild pain or moderate pain.  [name] albuterol (VENTOLIN HFA) 108 (90 Base) MCG/ACT inhaler Inhale 2 puffs into the lungs every 6 (six) hours as needed for wheezing (cough).  [name] aspirin 81 MG tablet Take 81 mg by mouth daily.   [name] atorvastatin (LIPITOR) 10 MG tablet Take 10 mg by mouth daily.  [name] donepezil (ARICEPT) 10 MG tablet Take 1 tablet by mouth once daily  . DULoxetine (CYMBALTA) 60 MG capsule Take 60 mg by mouth daily.   . folic acid (FOLVITE) 1 MG tablet Take 1 tablet (1 mg total) by mouth daily.  . metoprolol tartrate (LOPRESSOR) 25 MG tablet Take 1 tablet (25 mg total) by mouth 2 (two) times daily. Hold SBP<110, HR <60  . pantoprazole (PROTONIX) 40 MG tablet Take 1 tablet (40 mg total) by mouth 2 (two) times daily.  . pentoxifylline (TRENTAL) 400 MG CR tablet Take 400 mg by mouth 2 (two) times daily.  . [DISCONTINUED] albuterol (VENTOLIN HFA) 108 (90 Base) MCG/ACT inhaler INHALE 2 PUFFS BY MOUTH EVERY 4 TO 6 HOURS AS NEEDED FOR COUGH OR  WHEEZING (Patient taking differently: Inhale 2 puffs into the lungs every 4 (four) hours as needed for wheezing. )  . [DISCONTINUED] augmented betamethasone dipropionate (DIPROLENE-AF) 0.05 % ointment Apply 1 application topically 2 (two) times daily.   . [DISCONTINUED] diclofenac Sodium (VOLTAREN) 1 % GEL Apply 2 g topically 4 (four) times daily as needed (pain  along left buttock from fall).  . [DISCONTINUED] fluticasone furoate-vilanterol (BREO ELLIPTA) 200-25 MCG/INH AEPB Inhale 1 puff into the lungs  daily.  . [DISCONTINUED] furosemide (LASIX) 20 MG tablet Take 1 tablet (20 mg total) by mouth daily as needed for fluid or edema.  . [DISCONTINUED] methylPREDNISolone acetate (DEPO-MEDROL) 40 MG/ML injection Inject 2 mg into the muscle once as needed (leg pain).    No facility-administered encounter medications on file as of 12/18/2019.    Review of Systems  Constitutional: Positive for malaise/fatigue. Negative for chills and fever.  HENT: Negative for congestion and sore throat.   Eyes: Negative for blurred vision.  Respiratory: Positive for cough, shortness of breath and wheezing.        COPD  Cardiovascular: Negative for chest pain, palpitations and leg swelling.  Gastrointestinal: Negative for abdominal pain.  Genitourinary: Negative for dysuria.  Musculoskeletal: Positive for back pain, falls and myalgias.  Skin: Positive for itching and rash.       Right lower leg clustered papules (reports chronic and has a cream at home for this that family will bring)  Neurological: Positive for weakness. Negative for dizziness.  Endo/Heme/Allergies: Bruises/bleeds easily.  Psychiatric/Behavioral: Positive for memory loss. Negative for depression. The patient is not nervous/anxious and does not have insomnia.     Vitals:   12/18/19 1049  BP: 118/72  Pulse: 68  Temp: 98 F (36.7 C)  Weight: 128 lb (58.1 kg)  Height: 4\' 9"  (1.448 m)   Body mass index is 27.7 kg/m. Physical Exam Vitals reviewed.  Constitutional:      Appearance: Normal appearance.  HENT:     Head: Normocephalic and atraumatic.     Right Ear: External ear normal.     Left Ear: External ear normal.     Nose: Nose normal.     Mouth/Throat:     Pharynx: Oropharynx is clear.     Comments: Multiple missing teeth Eyes:     Extraocular Movements: Extraocular movements intact.     Conjunctiva/sclera: Conjunctivae normal.     Pupils: Pupils are equal, round, and reactive to light.  Cardiovascular:     Rate and Rhythm:  Normal rate and regular rhythm.     Pulses: Normal pulses.     Heart sounds: Normal heart sounds.  Pulmonary:     Effort: Pulmonary effort is normal.     Breath sounds: Normal breath sounds. No wheezing, rhonchi or rales.  Abdominal:     General: Bowel sounds are normal. There is no distension.     Palpations: Abdomen is soft. There is no mass.     Tenderness: There is no abdominal tenderness. There is no guarding or rebound.  Musculoskeletal:        General: Normal range of motion.     Cervical back: Neck supple.     Right lower leg: No edema.     Left lower leg: No edema.  Lymphadenopathy:     Cervical: No cervical adenopathy.  Skin:    General: Skin is warm and dry.     Capillary Refill: Capillary refill takes less than 2 seconds.     Comments: Cluster of papules on right lateral shin (not new per pt), pruritic  Neurological:     General: No focal deficit present.     Mental Status: She is alert.     Cranial Nerves: No cranial nerve deficit.     Motor: Weakness present.     Gait: Gait abnormal.  Psychiatric:  Mood and Affect: Mood normal.        Behavior: Behavior normal.     Labs reviewed: Basic Metabolic Panel: Recent Labs    12/11/19 1929 12/11/19 2238 12/12/19 0445 12/12/19 0445 12/13/19 0319 12/14/19 0307 12/15/19 0346  NA   < >  --  142   < > 139 140 141  K   < >  --  3.7   < > 3.8 3.9 3.8  CL   < >  --  112*   < > 111 109 106  CO2   < >  --  20*   < > 21* 23 25  GLUCOSE   < >  --  111*   < > 112* 104* 102*  BUN   < >  --  25*   < > 20 20 20   CREATININE   < >  --  0.81   < > 0.71 0.82 0.78  CALCIUM   < >  --  8.2*   < > 8.5* 8.4* 9.1  MG  --  2.3 2.2  --   --   --   --   PHOS  --  3.3 2.8  --   --   --   --    < > = values in this interval not displayed.   Liver Function Tests: Recent Labs    11/06/19 2113 12/11/19 1929 12/12/19 0445  AST 655* 35 37  ALT 289* 34 30  ALKPHOS 134* 67 59  BILITOT 1.7* 1.0 0.7  PROT 6.5 6.5 5.9*  ALBUMIN  3.3* 3.4* 2.9*   Recent Labs    11/06/19 2113  LIPASE 35   Recent Labs    12/11/19 2238  AMMONIA 10   CBC: Recent Labs    12/11/19 1929 12/11/19 1929 12/12/19 0445 12/12/19 0445 12/13/19 0319 12/14/19 0307 12/15/19 0346  WBC 12.4*   < > 12.0*   < > 10.5 9.3 10.6*  NEUTROABS 9.5*  --  9.4*  --   --   --   --   HGB 11.4*   < > 11.3*   < > 11.1* 11.1* 11.4*  HCT 36.7   < > 36.0   < > 34.7* 34.9* 35.8*  MCV 91.3   < > 92.1   < > 88.7 89.9 90.2  PLT 176   < > 176   < > 167 188 205   < > = values in this interval not displayed.   Cardiac Enzymes: Recent Labs    12/11/19 1929 12/12/19 1701  CKTOTAL 310* 178   BNP: Invalid input(s): POCBNP Lab Results  Component Value Date   HGBA1C  07/12/2009    5.3 (NOTE) The ADA recommends the following therapeutic goal for glycemic control related to Hgb A1c measurement: Goal of therapy: <6.5 Hgb A1c  Reference: American Diabetes Association: Clinical Practice Recommendations 2010, Diabetes Care, 2010, 33: (Suppl  1).   Lab Results  Component Value Date   TSH 0.473 12/12/2019   Lab Results  Component Value Date   [id number] 937 03/16/2018   No results found for: FOLATE No results found for: IRON, TIBC, FERRITIN  Imaging and Procedures obtained prior to SNF admission: DG Chest 1 View  Result Date: 12/11/2019 CLINICAL DATA:  Fall at home. EXAM: CHEST  1 VIEW COMPARISON:  Chest radiograph 02/22/2018 FINDINGS: Median sternotomy and CABG.The cardiomediastinal contours are normal. Upper normal heart size with aortic atherosclerosis. The lungs are clear. Pulmonary vasculature is normal. No  consolidation, pleural effusion, or pneumothorax. No acute osseous abnormalities are seen. Bones appear under mineralized. IMPRESSION: 1. No acute chest findings. 2. Median sternotomy and CABG. Aortic Atherosclerosis (ICD10-I70.0). Electronically Signed   By: Keith Rake M.D.   On: 12/11/2019 21:07   DG Pelvis 1-2 Views  Result Date:  12/11/2019 CLINICAL DATA:  Fall at home. EXAM: PELVIS - 1-2 VIEW COMPARISON:  Pelvis radiograph 02/20/2016. FINDINGS: The cortical margins of the bony pelvis are intact. No fracture. Pubic symphysis and sacroiliac joints are congruent. Age related degenerative change of both hips and the sacroiliac joints. Both femoral heads are well-seated in the respective acetabula. Bones diffusely under mineralized. IMPRESSION: No acute pelvic fracture. Electronically Signed   By: Keith Rake M.D.   On: 12/11/2019 21:07   DG Tibia/Fibula Right  Result Date: 12/11/2019 CLINICAL DATA:  Fall at home with bruising to right lower leg. EXAM: RIGHT TIBIA AND FIBULA - 2 VIEW COMPARISON:  None. FINDINGS: Cortical margins of the tibia and fibular intact. There is no evidence of fracture or other focal bone lesions. Knee and ankle alignment are maintained. The bones are under mineralized. Multiple surgical clips in the medial soft tissues. Soft tissues are unremarkable. IMPRESSION: No fracture of the right lower leg. Electronically Signed   By: Keith Rake M.D.   On: 12/11/2019 21:03   CT Head Wo Contrast  Result Date: 12/11/2019 CLINICAL DATA:  Change in mental status EXAM: CT HEAD WITHOUT CONTRAST TECHNIQUE: Contiguous axial images were obtained from the base of the skull through the vertex without intravenous contrast. COMPARISON:  February 20, 2016 FINDINGS: Brain: No evidence of acute territorial infarction, hemorrhage, hydrocephalus,extra-axial collection or mass lesion/mass effect. There is dilatation the ventricles and sulci consistent with age-related atrophy. Low-attenuation changes in the deep white matter consistent with small vessel ischemia. Vascular: No hyperdense vessel or unexpected calcification. Skull: The skull is intact. No fracture or focal lesion identified. Sinuses/Orbits: The visualized paranasal sinuses and mastoid air cells are clear. The orbits and globes intact. Other: None Cervical spine:  Alignment: There is a grade 1 anterolisthesis of C3 on C4 measuring 3 mm. There is a minimal anterolisthesis of C4 on C5. Skull base and vertebrae: Visualized skull base is intact. No atlanto-occipital dissociation. The vertebral body heights are well maintained. No fracture or pathologic osseous lesion seen. Soft tissues and spinal canal: The visualized paraspinal soft tissues are unremarkable. No prevertebral soft tissue swelling is seen. The spinal canal is grossly unremarkable, no large epidural collection or significant canal narrowing. Disc levels: Cervical spine spondylosis is seen with disc osteophyte complex and uncovertebral osteophytes most notable at C4-C5 with moderate neural foraminal narrowing and central canal stenosis. Upper chest: Mild centrilobular emphysematous changes seen at both lung apices. Scattered carotid artery calcifications are noted. Thoracic inlet is within normal limits. Other: None IMPRESSION: No acute intracranial abnormality. Findings consistent with age related atrophy and chronic small vessel ischemia No acute fracture or malalignment of the spine. Emphysema (ICD10-J43.9). Electronically Signed   By: Prudencio Pair M.D.   On: 12/11/2019 21:09   CT Cervical Spine Wo Contrast  Result Date: 12/11/2019 CLINICAL DATA:  Change in mental status EXAM: CT HEAD WITHOUT CONTRAST TECHNIQUE: Contiguous axial images were obtained from the base of the skull through the vertex without intravenous contrast. COMPARISON:  February 20, 2016 FINDINGS: Brain: No evidence of acute territorial infarction, hemorrhage, hydrocephalus,extra-axial collection or mass lesion/mass effect. There is dilatation the ventricles and sulci consistent with age-related atrophy. Low-attenuation changes in  the deep white matter consistent with small vessel ischemia. Vascular: No hyperdense vessel or unexpected calcification. Skull: The skull is intact. No fracture or focal lesion identified. Sinuses/Orbits: The  visualized paranasal sinuses and mastoid air cells are clear. The orbits and globes intact. Other: None Cervical spine: Alignment: There is a grade 1 anterolisthesis of C3 on C4 measuring 3 mm. There is a minimal anterolisthesis of C4 on C5. Skull base and vertebrae: Visualized skull base is intact. No atlanto-occipital dissociation. The vertebral body heights are well maintained. No fracture or pathologic osseous lesion seen. Soft tissues and spinal canal: The visualized paraspinal soft tissues are unremarkable. No prevertebral soft tissue swelling is seen. The spinal canal is grossly unremarkable, no large epidural collection or significant canal narrowing. Disc levels: Cervical spine spondylosis is seen with disc osteophyte complex and uncovertebral osteophytes most notable at C4-C5 with moderate neural foraminal narrowing and central canal stenosis. Upper chest: Mild centrilobular emphysematous changes seen at both lung apices. Scattered carotid artery calcifications are noted. Thoracic inlet is within normal limits. Other: None IMPRESSION: No acute intracranial abnormality. Findings consistent with age related atrophy and chronic small vessel ischemia No acute fracture or malalignment of the spine. Emphysema (ICD10-J43.9). Electronically Signed   By: Prudencio Pair M.D.   On: 12/11/2019 21:09   DG Knee Complete 4 Views Left  Result Date: 12/11/2019 CLINICAL DATA:  Fall at home.  Bruising to left knee. EXAM: LEFT KNEE - COMPLETE 4+ VIEW COMPARISON:  None. FINDINGS: No evidence of fracture, dislocation, or joint effusion. Minor tricompartmental peripheral spurring most prominent in the patellofemoral compartment. Small quadriceps tendon enthesophyte. Bony under mineralization. Soft tissues are unremarkable. IMPRESSION: 1. No acute fracture or subluxation of the left knee. 2. Mild tricompartmental osteoarthritis. Electronically Signed   By: Keith Rake M.D.   On: 12/11/2019 21:02   ECHOCARDIOGRAM  COMPLETE  Result Date: 12/12/2019    ECHOCARDIOGRAM REPORT   Patient Name:   Tammy [name] Date of Exam: 12/12/2019 Medical Rec #:  [id number]     Height:       57.0 in Accession #:    [id number]    Weight:       143.5 lb Date of Birth:  13-Aug-1930     BSA:          1.562 m Patient Age:    75 years      BP:           128/68 mmHg Patient Gender: F             HR:           87 bpm. Exam Location:  Inpatient Procedure: 2D Echo Indications:    syncope R55  History:        Patient has prior history of Echocardiogram examinations, most                 recent 07/19/2019. CAD, Prior CABG; Risk Factors:Hypertension and                 Dyslipidemia.  Sonographer:    Mikki Santee RDCS (AE) Referring Phys: [place]  1. Small intracavitary gradient noted. Peak velocity 0.98 m/s. Peak gradient 3.8 mmHg. Left ventricular ejection fraction, by estimation, is 60 to 65%. The left ventricle has normal function. The left ventricle has no regional wall motion abnormalities.  There is mild concentric left ventricular hypertrophy. Left ventricular diastolic parameters are consistent with Grade I diastolic dysfunction (impaired relaxation). Elevated left ventricular  end-diastolic pressure.  2. Right ventricular systolic function is normal. The right ventricular size is normal. There is normal pulmonary artery systolic pressure.  3. The mitral valve is normal in structure. No evidence of mitral valve regurgitation. No evidence of mitral stenosis.  4. The aortic valve is tricuspid. Aortic valve regurgitation is not visualized. No aortic stenosis is present.  5. The inferior vena cava is normal in size with greater than 50% respiratory variability, suggesting right atrial pressure of 3 mmHg. FINDINGS  Left Ventricle: Small intracavitary gradient noted. Peak velocity 0.98 m/s. Peak gradient 3.8 mmHg. Left ventricular ejection fraction, by estimation, is 60 to 65%. The left ventricle has normal function. The left  ventricle has no regional wall motion abnormalities. The left ventricular internal cavity size was normal in size. There is mild concentric left ventricular hypertrophy. Left ventricular diastolic parameters are consistent with Grade I diastolic dysfunction (impaired relaxation). Elevated left ventricular end-diastolic pressure. Right Ventricle: The right ventricular size is normal. No increase in right ventricular wall thickness. Right ventricular systolic function is normal. There is normal pulmonary artery systolic pressure. The tricuspid regurgitant velocity is 2.76 m/s, and  with an assumed right atrial pressure of 3 mmHg, the estimated right ventricular systolic pressure is 81.1 mmHg. Left Atrium: Left atrial size was normal in size. Right Atrium: Right atrial size was normal in size. Pericardium: There is no evidence of pericardial effusion. Mitral Valve: The mitral valve is normal in structure. Normal mobility of the mitral valve leaflets. No evidence of mitral valve regurgitation. No evidence of mitral valve stenosis. Tricuspid Valve: The tricuspid valve is normal in structure. Tricuspid valve regurgitation is trivial. No evidence of tricuspid stenosis. Aortic Valve: The aortic valve is tricuspid. Aortic valve regurgitation is not visualized. No aortic stenosis is present. Pulmonic Valve: The pulmonic valve was normal in structure. Pulmonic valve regurgitation is not visualized. No evidence of pulmonic stenosis. Aorta: The aortic root is normal in size and structure. Venous: The inferior vena cava is normal in size with greater than 50% respiratory variability, suggesting right atrial pressure of 3 mmHg. IAS/Shunts: No atrial level shunt detected by color flow Doppler.  LEFT VENTRICLE PLAX 2D LVIDd:         3.61 cm  Diastology LVIDs:         2.45 cm  LV e' lateral:   5.40 cm/s LV PW:         1.19 cm  LV E/e' lateral: 14.2 LV IVS:        1.18 cm  LV e' medial:    4.65 cm/s LVOT diam:     2.00 cm  LV E/e'  medial:  16.5 LV SV:         75 LV SV Index:   48 LVOT Area:     3.14 cm  RIGHT VENTRICLE RV S prime:     12.60 cm/s TAPSE (M-mode): 1.4 cm LEFT ATRIUM             Index       RIGHT ATRIUM           Index LA diam:        3.20 cm 2.05 cm/m  RA Area:     14.20 cm LA Vol (A2C):   39.1 ml 25.03 ml/m RA Volume:   31.20 ml  19.97 ml/m LA Vol (A4C):   40.7 ml 26.06 ml/m LA Biplane Vol: 41.5 ml 26.57 ml/m  AORTIC VALVE LVOT Vmax:   100.00 cm/s LVOT Vmean:  68.500 cm/s LVOT VTI:    0.238 m  AORTA Ao Root diam: 2.60 cm MITRAL VALVE                TRICUSPID VALVE MV Area (PHT): 2.60 cm     TR Peak grad:   30.5 mmHg MV Decel Time: 292 msec     TR Vmax:        276.00 cm/s MV E velocity: 76.50 cm/s MV A velocity: 119.00 cm/s  SHUNTS MV E/A ratio:  0.64         Systemic VTI:  0.24 m                             Systemic Diam: 2.00 cm Skeet Latch MD Electronically signed by Skeet Latch MD Signature Date/Time: 12/12/2019/4:32:19 PM    Final    VAS US CAROTID  Result Date: 12/12/2019 Carotid Arterial Duplex Study Indications:       Syncope. Risk Factors:      Hypertension, hyperlipidemia. Limitations        Today's exam was limited due to heavy calcification and the                    resulting shadowing and the patient's respiratory variation. Comparison Study:  No prior studies. Performing Technologist: Oliver Hum RVT  Examination Guidelines: A complete evaluation includes B-mode imaging, spectral Doppler, color Doppler, and power Doppler as needed of all accessible portions of each vessel. Bilateral testing is considered an integral part of a complete examination. Limited examinations for reoccurring indications may be performed as noted.  Right Carotid Findings: +----------+--------+--------+--------+--------------------------+--------+           PSV cm/sEDV cm/sStenosisPlaque Description        Comments +----------+--------+--------+--------+--------------------------+--------+ CCA Prox  104      9               smooth and heterogenous            +----------+--------+--------+--------+--------------------------+--------+ CCA Distal59      9               irregular and heterogenous         +----------+--------+--------+--------+--------------------------+--------+ ICA Prox  97      16              calcific                  tortuous +----------+--------+--------+--------+--------------------------+--------+ ICA Distal80      13                                        tortuous +----------+--------+--------+--------+--------------------------+--------+ ECA       112                                                        +----------+--------+--------+--------+--------------------------+--------+ +----------+--------+-------+--------+-------------------+           PSV cm/sEDV cmsDescribeArm Pressure (mmHG) +----------+--------+-------+--------+-------------------+ [id number]                                        +----------+--------+-------+--------+-------------------+ +---------+--------+--+--------+-+---------+ VertebralPSV cm/s49EDV  cm/s8Antegrade +---------+--------+--+--------+-+---------+  Left Carotid Findings: +----------+--------+--------+--------+-----------------------+--------+           PSV cm/sEDV cm/sStenosisPlaque Description     Comments +----------+--------+--------+--------+-----------------------+--------+ CCA Prox  100     11              smooth and heterogenous         +----------+--------+--------+--------+-----------------------+--------+ CCA Distal70      12              smooth and heterogenous         +----------+--------+--------+--------+-----------------------+--------+ ICA Prox  110     24              calcific                        +----------+--------+--------+--------+-----------------------+--------+ ICA Distal131     25                                     tortuous  +----------+--------+--------+--------+-----------------------+--------+ ECA       201     10                                              +----------+--------+--------+--------+-----------------------+--------+ +----------+--------+--------+--------+-------------------+           PSV cm/sEDV cm/sDescribeArm Pressure (mmHG) +----------+--------+--------+--------+-------------------+ [id number]                                         +----------+--------+--------+--------+-------------------+ +---------+--------+--+--------+-+---------+ VertebralPSV cm/s52EDV cm/s9Antegrade +---------+--------+--+--------+-+---------+   Summary: Right Carotid: Velocities in the right ICA are consistent with a 1-39% stenosis. Left Carotid: Velocities in the left ICA are consistent with a 1-39% stenosis. Vertebrals: Bilateral vertebral arteries demonstrate antegrade flow. *See table(s) above for measurements and observations.  Electronically signed by Antony Contras MD on 12/12/2019 at 4:32:16 PM.    Final     Assessment/Plan 1. Recurrent falls -here for PT, OT, was living at home alone with her dementia and h/o alcoholism -seems she needs more assistance to be safe since she was found down  2. Delirium -this has resolved  3. Dementia without behavioral disturbance, unspecified dementia type (HCC) -moderate, cont aricept, recommend at least life alert system at home if she is going to be alone in the future, medication monitoring by others and assistance with transport to appts; but need full team assessment while here to determine safety needs  4. Malnutrition of moderate degree -continue vitamin supplements  5. Left groin pain -improved, but remains sore, continue tylenol prn  6. PAF (paroxysmal atrial fibrillation) (HCC) -rate controlled with metoprolol, cont baby asa, not on noac with her trental and asa and living alone and falling  7. Benign essential HTN -bp ok at present, no  current dizziness, cont same regimen with metoprolol  8. History of rhabdomyolysis -from prolonged time down after fall, kidney function had returned to baseline at hospital, monitor  9. Coronary artery disease due to lipid rich plaque -and PAD on trental -cont secondary prevention in the short-term with her lipitor, bp control with beta blocker and her asa  10. Chronic diastolic CHF (congestive heart failure) (HCC) -not on a diuretic regimen which is probably for the best  when she lives alone and po intake poor -BMI currently ok for her age  -uses O2 for COPD  17. Full code status - Full code  Family/ staff Communication: discussed with snf ADON  Labs/tests ordered:  Cbc, bmp  Cherissa Hook L. Rozalyn Osland, D.O. [facility] [address], [place] [address] Cell Phone (Mon-Fri 8am-5pm):  [phone] On Call:  [phone] & follow prompts after 5pm & weekends Office Phone:  [phone] Office Fax:  [phone]

## 2019-12-24 DIAGNOSIS — D649 Anemia, unspecified: Secondary | ICD-10-CM | POA: Diagnosis not present

## 2019-12-24 DIAGNOSIS — I10 Essential (primary) hypertension: Secondary | ICD-10-CM | POA: Diagnosis not present

## 2019-12-24 LAB — BASIC METABOLIC PANEL
BUN: 12 (ref 4–21)
CO2: 18 (ref 13–22)
Chloride: 111 — AB (ref 99–108)
Creatinine: 0.8 (ref 0.5–1.1)
Glucose: 91
Potassium: 4 (ref 3.4–5.3)
Sodium: 143 (ref 137–147)

## 2019-12-24 LAB — CBC AND DIFFERENTIAL
HCT: 31 — AB (ref 36–46)
Hemoglobin: 10.1 — AB (ref 12.0–16.0)
Platelets: 297 (ref 150–399)
WBC: 9.1

## 2019-12-24 LAB — COMPREHENSIVE METABOLIC PANEL
Calcium: 8.8 (ref 8.7–10.7)
GFR calc Af Amer: 76.83
GFR calc non Af Amer: 66.29

## 2019-12-24 LAB — CBC: RBC: 3.52 — AB (ref 3.87–5.11)

## 2019-12-26 DIAGNOSIS — M25552 Pain in left hip: Secondary | ICD-10-CM | POA: Diagnosis not present

## 2019-12-26 DIAGNOSIS — W19XXXA Unspecified fall, initial encounter: Secondary | ICD-10-CM | POA: Diagnosis not present

## 2019-12-27 ENCOUNTER — Encounter: Payer: Self-pay | Admitting: Family

## 2019-12-27 ENCOUNTER — Non-Acute Institutional Stay (SKILLED_NURSING_FACILITY): Payer: PPO | Admitting: Family

## 2019-12-27 DIAGNOSIS — M533 Sacrococcygeal disorders, not elsewhere classified: Secondary | ICD-10-CM | POA: Diagnosis not present

## 2019-12-27 DIAGNOSIS — M25552 Pain in left hip: Secondary | ICD-10-CM | POA: Diagnosis not present

## 2019-12-27 DIAGNOSIS — R1032 Left lower quadrant pain: Secondary | ICD-10-CM | POA: Diagnosis not present

## 2019-12-27 DIAGNOSIS — W19XXXA Unspecified fall, initial encounter: Secondary | ICD-10-CM | POA: Diagnosis not present

## 2019-12-27 DIAGNOSIS — M5136 Other intervertebral disc degeneration, lumbar region: Secondary | ICD-10-CM | POA: Diagnosis not present

## 2019-12-27 NOTE — Progress Notes (Signed)
Location:    [place].   Nursing Home Room Number: 100-P Place of Service:  SNF (31) Provider:  Marlowe Sax, NP    Patient Care Team: [name], MD as PCP - General (Family Medicine) [name], MD as PCP - Cardiology (Cardiology)  Extended Emergency Contact Information Primary Emergency Contact: [name],[name] Address: [address]          [place] [address] [place] Phone: [phone] Relation: Daughter  Code Status:  FULL CODE Goals of care: Advanced Directive information Advanced Directives 12/27/2019  Does Patient Have a Medical Advance Directive? Yes  Type of Advance Directive -  Does patient want to make changes to medical advance directive? No - Patient declined  Copy of [name] in Chart? -  Would patient like information on creating a medical advance directive? -     Chief Complaint  Patient presents with  . Acute Visit    Fall.    HPI:  Pt is a 86 y.o. female seen today for an acute visit for evaluation of fall episode 12/26/2019.Nurse reports patient was observed sitting on the floor with Rolator bedside her.she did not know what happed but thinks lost balanced .she states has had multiple falls at home.she complained of left hip pain worst when bearing weight on left leg.X-ray of left hip and pelvis showed mild osteoarthritis with early healing of superior pubic ramus. She complains of pain on lumbar sacral area today states sat on the floor.she request a doughnut cushion to sit on while on chair.Facility nurse present will notify OT for chair cushion.of note she is status post hospital admission post fall at home found covered with feces unable to get up.possible on the floor for 2 days.   Past Medical History:  Diagnosis Date  . Acid reflux   . Asthma    Asthmatic COPD  . Atrial fibrillation ([place]) 01/01/2016  . Coronary artery disease    Echo 07/27/2011   . Coronary artery stenosis     , High-grade left main  . Dementia ([place])   . Essential tremor 03/16/2018  . GERD (gastroesophageal reflux disease)   . Hyperlipidemia   . Hypertension   . IBS (irritable bowel syndrome)   . Psoriasis    Past Surgical History:  Procedure Laterality Date  . CARDIAC SURGERY    . CORONARY ARTERY BYPASS GRAFT  1994   After catheterization showed 90% ostial left main stenosis  . DOPPLER ECHOCARDIOGRAPHY     Study showed mild to moderate MR with moderate TR, mild to moderate pulmonary hypertension with an ejection fraction greater that 55%.   [name] EYE SURGERY    . Nuclear Perfusion  10/2010, 11/15/2012   Showed normal perfusion, No Ischemia or Infarction, Normal EF  . Renal Scan Duplex  04/09/2012   Showing greater than 50% diameter reduction in the celiac artery and SMA.. Right proximal 275 and Mid 152.  [name] ROTATOR CUFF REPAIR     bilateral  . Sleep Study  12/28/2006   AHI during total sleep time (3h 19 minutes) was 1.81/hr and during REM sleep at 17.78/hr. Mild sleep apnea during REM sleep.Oxygen staturated rate during REM and NREM was 93.0%.    Allergies  Allergen Reactions  . Penicillins Hives    Has patient had a PCN reaction causing immediate rash, facial/tongue/throat swelling, SOB or lightheadedness with hypotension: No Has patient had a PCN reaction causing severe rash involving mucus membranes or skin necrosis:  Yes Has patient had a PCN reaction that required hospitalization No Has patient had a PCN reaction occurring within the last 10 years: Yes took again last year 2016 If all of the above answers are "NO", then may proceed with Cephalosporin use.   . Naprosyn [Naproxen] Nausea Only    diarrhea  . Sertraline Diarrhea  . Lactose Intolerance (Gi) Other (See Comments)    Unknown- [facility] of [facility] documented    Allergies as of 12/27/2019      Reactions   Penicillins Hives   Has patient had a PCN reaction causing immediate rash, facial/tongue/throat  swelling, SOB or lightheadedness with hypotension: No Has patient had a PCN reaction causing severe rash involving mucus membranes or skin necrosis: Yes Has patient had a PCN reaction that required hospitalization No Has patient had a PCN reaction occurring within the last 10 years: Yes took again last year 2016 If all of the above answers are "NO", then may proceed with Cephalosporin use.   Naprosyn [naproxen] Nausea Only   diarrhea   Sertraline Diarrhea   Lactose Intolerance (gi) Other (See Comments)   Unknown- Home Health of [facility] documented      Medication List       Accurate as of December 27, 2019  9:55 AM. If you have any questions, ask your nurse or doctor.        acetaminophen 325 MG tablet Commonly known as: TYLENOL Take 2 tablets (650 mg total) by mouth every 6 (six) hours as needed for mild pain or moderate pain.   albuterol 108 (90 Base) MCG/ACT inhaler Commonly known as: VENTOLIN HFA Inhale 2 puffs into the lungs every 6 (six) hours as needed for wheezing (cough).   amLODipine 2.5 MG tablet Commonly known as: NORVASC Take 2.5 mg by mouth at bedtime.   aspirin 81 MG tablet Take 81 mg by mouth daily.   atorvastatin 10 MG tablet Commonly known as: LIPITOR Take 10 mg by mouth daily.   betamethasone dipropionate 0.05 % ointment Commonly known as: DIPROLENE Apply 1 application topically daily. Apply to right lower leg.   Breo Ellipta 200-25 MCG/INH Aepb Generic drug: fluticasone furoate-vilanterol Inhale 1 puff into the lungs daily. Rinse mouth out with water after using medication.   donepezil 10 MG tablet Commonly known as: ARICEPT Take 1 tablet by mouth once daily   DULoxetine 60 MG capsule Commonly known as: CYMBALTA Take 60 mg by mouth daily.   folic acid 1 MG tablet Commonly known as: FOLVITE Take 1 tablet (1 mg total) by mouth daily.   irbesartan 300 MG tablet Commonly known as: AVAPRO Take 300 mg by mouth daily.   metoprolol  tartrate 25 MG tablet Commonly known as: LOPRESSOR Take 1 tablet (25 mg total) by mouth 2 (two) times daily. Hold SBP<110, HR <60   pantoprazole 40 MG tablet Commonly known as: Protonix Take 1 tablet (40 mg total) by mouth 2 (two) times daily.   pentoxifylline 400 MG CR tablet Commonly known as: TRENTAL Take 400 mg by mouth 2 (two) times daily.       Review of Systems  Constitutional: Negative for appetite change, chills, fatigue and fever.  HENT: Negative for congestion, rhinorrhea, sinus pressure, sinus pain, sneezing and sore throat.   Eyes: Negative for discharge, redness and itching.  Respiratory: Negative for cough, chest tightness, shortness of breath and wheezing.   Cardiovascular: Negative for chest pain, palpitations and leg swelling.  Gastrointestinal: Negative for abdominal distention, abdominal pain,  constipation, diarrhea, nausea and vomiting.  Endocrine: Negative for cold intolerance, heat intolerance, polydipsia, polyphagia and polyuria.  Genitourinary: Negative for difficulty urinating, dysuria, flank pain and urgency.       Incontinent   Musculoskeletal: Positive for arthralgias, back pain and gait problem. Negative for joint swelling, myalgias and neck pain.  Skin: Negative for color change, pallor and rash.  Neurological: Negative for dizziness, weakness, light-headedness, numbness and headaches.  Psychiatric/Behavioral: Negative for agitation, behavioral problems, hallucinations and sleep disturbance. The patient is not nervous/anxious.     Immunization History  Administered Date(s) Administered  . Influenza, High Dose Seasonal PF 03/06/2019  . Influenza,inj,Quad PF,6+ Mos 04/25/2014  . PFIZER SARS-COV-2 Vaccination 06/11/2019, 07/02/2019   Pertinent  Health Maintenance Due  Topic Date Due  . DEXA SCAN  Never done  . PNA vac Low Risk Adult (1 of 2 - PCV13) Never done  . INFLUENZA VACCINE  12/16/2019   Fall Risk  12/14/2017 11/24/2017 11/08/2017 12/23/2016  12/13/2016  Falls in the past year? Yes Yes Yes Yes Yes  Number falls in past yr: 2 or more 2 or more 2 or more 1 1  Injury with Fall? No Yes (No Data) No No  Comment - - bruises - -  Risk Factor Category  - High Fall Risk - - -  Risk for fall due to : Impaired balance/gait;History of fall(s) History of fall(s);Impaired balance/gait Impaired balance/gait Impaired balance/gait Impaired balance/gait;History of fall(s)  Follow up Falls prevention discussed - - Falls prevention discussed -   Functional Status Survey:    Vitals:   12/27/19 0946  BP: (!) 117/59  Pulse: 60  Resp: 18  Temp: (!) 96.6 F (35.9 C)  Weight: 128 lb (58.1 kg)  Height: 4\' 9"  (1.448 m)   Body mass index is 27.7 kg/m. Physical Exam Vitals reviewed.  Constitutional:      General: She is not in acute distress.    Appearance: She is not ill-appearing.  HENT:     Head: Normocephalic.     Nose: Nose normal. No congestion or rhinorrhea.     Mouth/Throat:     Mouth: Mucous membranes are moist.     Pharynx: Oropharynx is clear. No oropharyngeal exudate or posterior oropharyngeal erythema.     Comments: Missing teeth and multiple broken teeth  Eyes:     General: No scleral icterus.       Right eye: No discharge.        Left eye: No discharge.     Conjunctiva/sclera: Conjunctivae normal.     Pupils: Pupils are equal, round, and reactive to light.  Cardiovascular:     Rate and Rhythm: Normal rate and regular rhythm.     Pulses: Normal pulses.     Heart sounds: Normal heart sounds. No murmur heard.  No friction rub. No gallop.   Pulmonary:     Effort: Pulmonary effort is normal. No respiratory distress.     Breath sounds: Normal breath sounds. No wheezing, rhonchi or rales.  Chest:     Chest wall: No tenderness.  Abdominal:     General: Bowel sounds are normal. There is no distension.     Palpations: Abdomen is soft. There is no mass.     Tenderness: There is no abdominal tenderness. There is no right CVA  tenderness, left CVA tenderness, guarding or rebound.  Musculoskeletal:        General: No swelling.     Cervical back: Normal range of motion. No rigidity or  tenderness.     Right lower leg: No edema.     Left lower leg: No edema.     Comments: Unsteady gait right groin and lumbar- sacral area tenderness worst with standing   Lymphadenopathy:     Cervical: No cervical adenopathy.  Skin:    General: Skin is warm and dry.     Coloration: Skin is not pale.     Findings: No erythema or rash.     Comments: Left skin abrasion progressive healing.no signs of infection.  Neurological:     Mental Status: She is alert. Mental status is at baseline.     Sensory: No sensory deficit.     Motor: No weakness.     Coordination: Coordination normal.     Gait: Gait abnormal.  Psychiatric:        Mood and Affect: Mood normal.        Behavior: Behavior normal.        Thought Content: Thought content normal.        Judgment: Judgment normal.     Labs reviewed: Recent Labs    12/11/19 1929 12/11/19 2238 12/12/19 0445 12/12/19 0445 12/13/19 0319 12/13/19 0319 12/14/19 0307 12/15/19 0346 12/24/19 0000  NA   < >  --  142   < > 139   < > 140 141 143  K   < >  --  3.7   < > 3.8   < > 3.9 3.8 4.0  CL   < >  --  112*   < > 111   < > 109 106 111*  CO2   < >  --  20*   < > 21*   < > 23 25 18   GLUCOSE   < >  --  111*   < > 112*  --  104* 102*  --   BUN   < >  --  25*   < > 20   < > 20 20 12   CREATININE   < >  --  0.81   < > 0.71   < > 0.82 0.78 0.8  CALCIUM   < >  --  8.2*   < > 8.5*   < > 8.4* 9.1 8.8  MG  --  2.3 2.2  --   --   --   --   --   --   PHOS  --  3.3 2.8  --   --   --   --   --   --    < > = values in this interval not displayed.   Recent Labs    11/06/19 2113 12/11/19 1929 12/12/19 0445  AST 655* 35 37  ALT 289* 34 30  ALKPHOS 134* 67 59  BILITOT 1.7* 1.0 0.7  PROT 6.5 6.5 5.9*  ALBUMIN 3.3* 3.4* 2.9*   Recent Labs    12/11/19 1929 12/11/19 1929 12/12/19 0445  12/12/19 0445 12/13/19 0319 12/13/19 0319 12/14/19 0307 12/15/19 0346 12/24/19 0000  WBC 12.4*   < > 12.0*   < > 10.5   < > 9.3 10.6* 9.1  NEUTROABS 9.5*  --  9.4*  --   --   --   --   --   --   HGB 11.4*   < > 11.3*   < > 11.1*   < > 11.1* 11.4* 10.1*  HCT 36.7   < > 36.0   < > 34.7*   < > 34.9* 35.8*  31*  MCV 91.3   < > 92.1   < > 88.7  --  89.9 90.2  --   PLT 176   < > 176   < > 167   < > 188 205 297   < > = values in this interval not displayed.   Lab Results  Component Value Date   TSH 0.473 12/12/2019   Lab Results  Component Value Date   HGBA1C  07/12/2009    5.3 (NOTE) The ADA recommends the following therapeutic goal for glycemic control related to Hgb A1c measurement: Goal of therapy: <6.5 Hgb A1c  Reference: American Diabetes Association: Clinical Practice Recommendations 2010, Diabetes Care, 2010, 33: (Suppl  1).   Lab Results  Component Value Date   CHOL 118 01/03/2016   HDL 43 01/03/2016   LDLCALC 54 01/03/2016   TRIG 106 01/03/2016   CHOLHDL 2.7 01/03/2016    Significant Diagnostic Results in last 30 days:  DG Chest 1 View  Result Date: 12/11/2019 CLINICAL DATA:  Fall at home. EXAM: CHEST  1 VIEW COMPARISON:  Chest radiograph 02/22/2018 FINDINGS: Median sternotomy and CABG.The cardiomediastinal contours are normal. Upper normal heart size with aortic atherosclerosis. The lungs are clear. Pulmonary vasculature is normal. No consolidation, pleural effusion, or pneumothorax. No acute osseous abnormalities are seen. Bones appear under mineralized. IMPRESSION: 1. No acute chest findings. 2. Median sternotomy and CABG. Aortic Atherosclerosis (ICD10-I70.0). Electronically Signed   By: Keith Rake M.D.   On: 12/11/2019 21:07   DG Pelvis 1-2 Views  Result Date: 12/11/2019 CLINICAL DATA:  Fall at home. EXAM: PELVIS - 1-2 VIEW COMPARISON:  Pelvis radiograph 02/20/2016. FINDINGS: The cortical margins of the bony pelvis are intact. No fracture. Pubic symphysis and  sacroiliac joints are congruent. Age related degenerative change of both hips and the sacroiliac joints. Both femoral heads are well-seated in the respective acetabula. Bones diffusely under mineralized. IMPRESSION: No acute pelvic fracture. Electronically Signed   By: Keith Rake M.D.   On: 12/11/2019 21:07   DG Tibia/Fibula Right  Result Date: 12/11/2019 CLINICAL DATA:  Fall at home with bruising to right lower leg. EXAM: RIGHT TIBIA AND FIBULA - 2 VIEW COMPARISON:  None. FINDINGS: Cortical margins of the tibia and fibular intact. There is no evidence of fracture or other focal bone lesions. Knee and ankle alignment are maintained. The bones are under mineralized. Multiple surgical clips in the medial soft tissues. Soft tissues are unremarkable. IMPRESSION: No fracture of the right lower leg. Electronically Signed   By: Keith Rake M.D.   On: 12/11/2019 21:03   CT Head Wo Contrast  Result Date: 12/11/2019 CLINICAL DATA:  Change in mental status EXAM: CT HEAD WITHOUT CONTRAST TECHNIQUE: Contiguous axial images were obtained from the base of the skull through the vertex without intravenous contrast. COMPARISON:  February 20, 2016 FINDINGS: Brain: No evidence of acute territorial infarction, hemorrhage, hydrocephalus,extra-axial collection or mass lesion/mass effect. There is dilatation the ventricles and sulci consistent with age-related atrophy. Low-attenuation changes in the deep white matter consistent with small vessel ischemia. Vascular: No hyperdense vessel or unexpected calcification. Skull: The skull is intact. No fracture or focal lesion identified. Sinuses/Orbits: The visualized paranasal sinuses and mastoid air cells are clear. The orbits and globes intact. Other: None Cervical spine: Alignment: There is a grade 1 anterolisthesis of C3 on C4 measuring 3 mm. There is a minimal anterolisthesis of C4 on C5. Skull base and vertebrae: Visualized skull base is intact. No atlanto-occipital  dissociation.  The vertebral body heights are well maintained. No fracture or pathologic osseous lesion seen. Soft tissues and spinal canal: The visualized paraspinal soft tissues are unremarkable. No prevertebral soft tissue swelling is seen. The spinal canal is grossly unremarkable, no large epidural collection or significant canal narrowing. Disc levels: Cervical spine spondylosis is seen with disc osteophyte complex and uncovertebral osteophytes most notable at C4-C5 with moderate neural foraminal narrowing and central canal stenosis. Upper chest: Mild centrilobular emphysematous changes seen at both lung apices. Scattered carotid artery calcifications are noted. Thoracic inlet is within normal limits. Other: None IMPRESSION: No acute intracranial abnormality. Findings consistent with age related atrophy and chronic small vessel ischemia No acute fracture or malalignment of the spine. Emphysema (ICD10-J43.9). Electronically Signed   By: Prudencio Pair M.D.   On: 12/11/2019 21:09   CT Cervical Spine Wo Contrast  Result Date: 12/11/2019 CLINICAL DATA:  Change in mental status EXAM: CT HEAD WITHOUT CONTRAST TECHNIQUE: Contiguous axial images were obtained from the base of the skull through the vertex without intravenous contrast. COMPARISON:  February 20, 2016 FINDINGS: Brain: No evidence of acute territorial infarction, hemorrhage, hydrocephalus,extra-axial collection or mass lesion/mass effect. There is dilatation the ventricles and sulci consistent with age-related atrophy. Low-attenuation changes in the deep white matter consistent with small vessel ischemia. Vascular: No hyperdense vessel or unexpected calcification. Skull: The skull is intact. No fracture or focal lesion identified. Sinuses/Orbits: The visualized paranasal sinuses and mastoid air cells are clear. The orbits and globes intact. Other: None Cervical spine: Alignment: There is a grade 1 anterolisthesis of C3 on C4 measuring 3 mm. There is a  minimal anterolisthesis of C4 on C5. Skull base and vertebrae: Visualized skull base is intact. No atlanto-occipital dissociation. The vertebral body heights are well maintained. No fracture or pathologic osseous lesion seen. Soft tissues and spinal canal: The visualized paraspinal soft tissues are unremarkable. No prevertebral soft tissue swelling is seen. The spinal canal is grossly unremarkable, no large epidural collection or significant canal narrowing. Disc levels: Cervical spine spondylosis is seen with disc osteophyte complex and uncovertebral osteophytes most notable at C4-C5 with moderate neural foraminal narrowing and central canal stenosis. Upper chest: Mild centrilobular emphysematous changes seen at both lung apices. Scattered carotid artery calcifications are noted. Thoracic inlet is within normal limits. Other: None IMPRESSION: No acute intracranial abnormality. Findings consistent with age related atrophy and chronic small vessel ischemia No acute fracture or malalignment of the spine. Emphysema (ICD10-J43.9). Electronically Signed   By: Prudencio Pair M.D.   On: 12/11/2019 21:09   DG Knee Complete 4 Views Left  Result Date: 12/11/2019 CLINICAL DATA:  Fall at home.  Bruising to left knee. EXAM: LEFT KNEE - COMPLETE 4+ VIEW COMPARISON:  None. FINDINGS: No evidence of fracture, dislocation, or joint effusion. Minor tricompartmental peripheral spurring most prominent in the patellofemoral compartment. Small quadriceps tendon enthesophyte. Bony under mineralization. Soft tissues are unremarkable. IMPRESSION: 1. No acute fracture or subluxation of the left knee. 2. Mild tricompartmental osteoarthritis. Electronically Signed   By: Keith Rake M.D.   On: 12/11/2019 21:02   ECHOCARDIOGRAM COMPLETE  Result Date: 12/12/2019    ECHOCARDIOGRAM REPORT   Patient Name:   Tammy [name] Date of Exam: 12/12/2019 Medical Rec #:  [id number]     Height:       57.0 in Accession #:    [id number]    Weight:        143.5 lb Date of Birth:  03-02-1931  BSA:          1.562 m Patient Age:    22 years      BP:           128/68 mmHg Patient Gender: F             HR:           87 bpm. Exam Location:  Inpatient Procedure: 2D Echo Indications:    syncope R55  History:        Patient has prior history of Echocardiogram examinations, most                 recent 07/19/2019. CAD, Prior CABG; Risk Factors:Hypertension and                 Dyslipidemia.  Sonographer:    Mikki Santee RDCS (AE) Referring Phys: [place]  1. Small intracavitary gradient noted. Peak velocity 0.98 m/s. Peak gradient 3.8 mmHg. Left ventricular ejection fraction, by estimation, is 60 to 65%. The left ventricle has normal function. The left ventricle has no regional wall motion abnormalities.  There is mild concentric left ventricular hypertrophy. Left ventricular diastolic parameters are consistent with Grade I diastolic dysfunction (impaired relaxation). Elevated left ventricular end-diastolic pressure.  2. Right ventricular systolic function is normal. The right ventricular size is normal. There is normal pulmonary artery systolic pressure.  3. The mitral valve is normal in structure. No evidence of mitral valve regurgitation. No evidence of mitral stenosis.  4. The aortic valve is tricuspid. Aortic valve regurgitation is not visualized. No aortic stenosis is present.  5. The inferior vena cava is normal in size with greater than 50% respiratory variability, suggesting right atrial pressure of 3 mmHg. FINDINGS  Left Ventricle: Small intracavitary gradient noted. Peak velocity 0.98 m/s. Peak gradient 3.8 mmHg. Left ventricular ejection fraction, by estimation, is 60 to 65%. The left ventricle has normal function. The left ventricle has no regional wall motion abnormalities. The left ventricular internal cavity size was normal in size. There is mild concentric left ventricular hypertrophy. Left ventricular diastolic parameters are  consistent with Grade I diastolic dysfunction (impaired relaxation). Elevated left ventricular end-diastolic pressure. Right Ventricle: The right ventricular size is normal. No increase in right ventricular wall thickness. Right ventricular systolic function is normal. There is normal pulmonary artery systolic pressure. The tricuspid regurgitant velocity is 2.76 m/s, and  with an assumed right atrial pressure of 3 mmHg, the estimated right ventricular systolic pressure is 16.1 mmHg. Left Atrium: Left atrial size was normal in size. Right Atrium: Right atrial size was normal in size. Pericardium: There is no evidence of pericardial effusion. Mitral Valve: The mitral valve is normal in structure. Normal mobility of the mitral valve leaflets. No evidence of mitral valve regurgitation. No evidence of mitral valve stenosis. Tricuspid Valve: The tricuspid valve is normal in structure. Tricuspid valve regurgitation is trivial. No evidence of tricuspid stenosis. Aortic Valve: The aortic valve is tricuspid. Aortic valve regurgitation is not visualized. No aortic stenosis is present. Pulmonic Valve: The pulmonic valve was normal in structure. Pulmonic valve regurgitation is not visualized. No evidence of pulmonic stenosis. Aorta: The aortic root is normal in size and structure. Venous: The inferior vena cava is normal in size with greater than 50% respiratory variability, suggesting right atrial pressure of 3 mmHg. IAS/Shunts: No atrial level shunt detected by color flow Doppler.  LEFT VENTRICLE PLAX 2D LVIDd:         3.61 cm  Diastology LVIDs:         2.45 cm  LV e' lateral:   5.40 cm/s LV PW:         1.19 cm  LV E/e' lateral: 14.2 LV IVS:        1.18 cm  LV e' medial:    4.65 cm/s LVOT diam:     2.00 cm  LV E/e' medial:  16.5 LV SV:         75 LV SV Index:   48 LVOT Area:     3.14 cm  RIGHT VENTRICLE RV S prime:     12.60 cm/s TAPSE (M-mode): 1.4 cm LEFT ATRIUM             Index       RIGHT ATRIUM           Index LA diam:         3.20 cm 2.05 cm/m  RA Area:     14.20 cm LA Vol (A2C):   39.1 ml 25.03 ml/m RA Volume:   31.20 ml  19.97 ml/m LA Vol (A4C):   40.7 ml 26.06 ml/m LA Biplane Vol: 41.5 ml 26.57 ml/m  AORTIC VALVE LVOT Vmax:   100.00 cm/s LVOT Vmean:  68.500 cm/s LVOT VTI:    0.238 m  AORTA Ao Root diam: 2.60 cm MITRAL VALVE                TRICUSPID VALVE MV Area (PHT): 2.60 cm     TR Peak grad:   30.5 mmHg MV Decel Time: 292 msec     TR Vmax:        276.00 cm/s MV E velocity: 76.50 cm/s MV A velocity: 119.00 cm/s  SHUNTS MV E/A ratio:  0.64         Systemic VTI:  0.24 m                             Systemic Diam: 2.00 cm Skeet Latch MD Electronically signed by Skeet Latch MD Signature Date/Time: 12/12/2019/4:32:19 PM    Final    VAS US CAROTID  Result Date: 12/12/2019 Carotid Arterial Duplex Study Indications:       Syncope. Risk Factors:      Hypertension, hyperlipidemia. Limitations        Today's exam was limited due to heavy calcification and the                    resulting shadowing and the patient's respiratory variation. Comparison Study:  No prior studies. Performing Technologist: Oliver Hum RVT  Examination Guidelines: A complete evaluation includes B-mode imaging, spectral Doppler, color Doppler, and power Doppler as needed of all accessible portions of each vessel. Bilateral testing is considered an integral part of a complete examination. Limited examinations for reoccurring indications may be performed as noted.  Right Carotid Findings: +----------+--------+--------+--------+--------------------------+--------+           PSV cm/sEDV cm/sStenosisPlaque Description        Comments +----------+--------+--------+--------+--------------------------+--------+ CCA Prox  104     9               smooth and heterogenous            +----------+--------+--------+--------+--------------------------+--------+ CCA Distal59      9               irregular and heterogenous          +----------+--------+--------+--------+--------------------------+--------+ ICA Prox  97      16              calcific                  tortuous +----------+--------+--------+--------+--------------------------+--------+ ICA Distal80      13                                        tortuous +----------+--------+--------+--------+--------------------------+--------+ ECA       112                                                        +----------+--------+--------+--------+--------------------------+--------+ +----------+--------+-------+--------+-------------------+           PSV cm/sEDV cmsDescribeArm Pressure (mmHG) +----------+--------+-------+--------+-------------------+ [id number]                                        +----------+--------+-------+--------+-------------------+ +---------+--------+--+--------+-+---------+ VertebralPSV cm/s49EDV cm/s8Antegrade +---------+--------+--+--------+-+---------+  Left Carotid Findings: +----------+--------+--------+--------+-----------------------+--------+           PSV cm/sEDV cm/sStenosisPlaque Description     Comments +----------+--------+--------+--------+-----------------------+--------+ CCA Prox  100     11              smooth and heterogenous         +----------+--------+--------+--------+-----------------------+--------+ CCA Distal70      12              smooth and heterogenous         +----------+--------+--------+--------+-----------------------+--------+ ICA Prox  110     24              calcific                        +----------+--------+--------+--------+-----------------------+--------+ ICA Distal131     25                                     tortuous +----------+--------+--------+--------+-----------------------+--------+ ECA       201     10                                              +----------+--------+--------+--------+-----------------------+--------+  +----------+--------+--------+--------+-------------------+           PSV cm/sEDV cm/sDescribeArm Pressure (mmHG) +----------+--------+--------+--------+-------------------+ [id number]                                         +----------+--------+--------+--------+-------------------+ +---------+--------+--+--------+-+---------+ VertebralPSV cm/s52EDV cm/s9Antegrade +---------+--------+--+--------+-+---------+   Summary: Right Carotid: Velocities in the right ICA are consistent with a 1-39% stenosis. Left Carotid: Velocities in the left ICA are consistent with a 1-39% stenosis. Vertebrals: Bilateral vertebral arteries demonstrate antegrade flow. *See table(s) above for measurements and observations.  Electronically signed by Antony Contras MD on 12/12/2019 at 4:32:16 PM.    Final     Assessment/Plan 1. Left groin pain Status post unwitnessed fall episode. - Portable left  hip and pelvis X-ray 2-3 views rule out fracture due to left groin pain.  - continue current pain regimen   2. Acute hip pain, left - Portable left hip and pelvis X-ray 2-3 views rule out fracture due to pain. - continue current pain regimen.  3. Sacral back pain Status post fall observed by staff sitting on the floor. - Portable lumbar/sacral X-ray 2-3 views rule out fracture. - continue with current pain regimen.   Family/ staff Communication: Reviewed plan of care with patient and facility Nurse.   Labs/tests ordered:  - left hip and pelvis X-ray 2-3 views   - lumbar/sacral X-ray 2-3 views

## 2019-12-28 DIAGNOSIS — J069 Acute upper respiratory infection, unspecified: Secondary | ICD-10-CM | POA: Diagnosis not present

## 2020-01-03 DIAGNOSIS — M5136 Other intervertebral disc degeneration, lumbar region: Secondary | ICD-10-CM | POA: Diagnosis not present

## 2020-01-03 DIAGNOSIS — M47896 Other spondylosis, lumbar region: Secondary | ICD-10-CM | POA: Diagnosis not present

## 2020-01-03 DIAGNOSIS — M545 Low back pain: Secondary | ICD-10-CM | POA: Diagnosis not present

## 2020-01-14 DIAGNOSIS — S32512D Fracture of superior rim of left pubis, subsequent encounter for fracture with routine healing: Secondary | ICD-10-CM | POA: Diagnosis not present

## 2020-01-14 DIAGNOSIS — M25552 Pain in left hip: Secondary | ICD-10-CM | POA: Diagnosis not present

## 2020-01-17 ENCOUNTER — Ambulatory Visit: Payer: PPO | Admitting: Neurology

## 2020-01-17 ENCOUNTER — Encounter: Payer: Self-pay | Admitting: Family

## 2020-01-17 ENCOUNTER — Non-Acute Institutional Stay (SKILLED_NURSING_FACILITY): Payer: PPO | Admitting: Family

## 2020-01-17 DIAGNOSIS — F0390 Unspecified dementia without behavioral disturbance: Secondary | ICD-10-CM

## 2020-01-17 DIAGNOSIS — I5032 Chronic diastolic (congestive) heart failure: Secondary | ICD-10-CM | POA: Diagnosis not present

## 2020-01-17 DIAGNOSIS — I480 Paroxysmal atrial fibrillation: Secondary | ICD-10-CM | POA: Diagnosis not present

## 2020-01-17 DIAGNOSIS — E785 Hyperlipidemia, unspecified: Secondary | ICD-10-CM

## 2020-01-17 DIAGNOSIS — K219 Gastro-esophageal reflux disease without esophagitis: Secondary | ICD-10-CM | POA: Diagnosis not present

## 2020-01-17 DIAGNOSIS — I10 Essential (primary) hypertension: Secondary | ICD-10-CM | POA: Diagnosis not present

## 2020-01-17 DIAGNOSIS — I25119 Atherosclerotic heart disease of native coronary artery with unspecified angina pectoris: Secondary | ICD-10-CM | POA: Diagnosis not present

## 2020-01-17 NOTE — Progress Notes (Signed)
Location:    [facility] Room Number: 100/P Place of Service:  SNF [phone]) Provider:  Marlowe Sax NP  [name], MD  Patient Care Team: [name], MD as PCP - General (Family Medicine) [name], MD as PCP - Cardiology (Cardiology)  Extended Emergency Contact Information Primary Emergency Contact: [name],[name] Address: [address] [place]           [address] [place] Phone: [phone] Relation: Daughter  Code Status:  Full Code Goals of care: Advanced Directive information Advanced Directives 01/17/2020  Does Patient Have a Medical Advance Directive? Yes  Type of Advance Directive [place]  Does patient want to make changes to medical advance directive? No - Patient declined  Copy of [place] in Chart? Yes - validated most recent copy scanned in chart (See row information)  Would patient like information on creating a medical advance directive? -     Chief Complaint  Patient presents with  . Medical Management of Chronic Issues    Routine Visit of Medical Management    HPI:  Pt is a 86 y.o. female seen today for medical management of chronic diseases. She has a medical history of Hypertension,Afib,CAD,COPD on occasional oxygen via nasal cannula,Hyperlipidemia,IBS,GERD among other conditions.she is seen in her room today sitting on recliner.she continue to work with therapy post hospital admission at [facility] 12/11/2019 - 12/17/2019 after she was found on the floor by her daughter for about 2 days covered with feces. She continue to require x 1 assist with her ADL's. She sustained an witnessed  fall episode here on 12/26/2019 complained of sacral and left groin pain.X-ray done was negative for fracture or dislocation.     Past Medical History:  Diagnosis Date  . Acid reflux   . Asthma    Asthmatic COPD  . Atrial fibrillation ([place]) 01/01/2016  . Coronary artery  disease    Echo 07/27/2011   . Coronary artery stenosis    , High-grade left main  . Dementia ([place])   . Essential tremor 03/16/2018  . GERD (gastroesophageal reflux disease)   . Hyperlipidemia   . Hypertension   . IBS (irritable bowel syndrome)   . Psoriasis    Past Surgical History:  Procedure Laterality Date  . CARDIAC SURGERY    . CORONARY ARTERY BYPASS GRAFT  1994   After catheterization showed 90% ostial left main stenosis  . DOPPLER ECHOCARDIOGRAPHY     Study showed mild to moderate MR with moderate TR, mild to moderate pulmonary hypertension with an ejection fraction greater that 55%.   [name] EYE SURGERY    . Nuclear Perfusion  10/2010, 11/15/2012   Showed normal perfusion, No Ischemia or Infarction, Normal EF  . Renal Scan Duplex  04/09/2012   Showing greater than 50% diameter reduction in the celiac artery and SMA.. Right proximal 275 and Mid 152.  [name] ROTATOR CUFF REPAIR     bilateral  . Sleep Study  12/28/2006   AHI during total sleep time (3h 19 minutes) was 1.81/hr and during REM sleep at 17.78/hr. Mild sleep apnea during REM sleep.Oxygen staturated rate during REM and NREM was 93.0%.    Allergies  Allergen Reactions  . Penicillins Hives    Has patient had a PCN reaction causing immediate rash, facial/tongue/throat swelling, SOB or lightheadedness with hypotension: No Has patient had a PCN reaction causing severe rash involving mucus membranes or skin necrosis: Yes Has patient  had a PCN reaction that required hospitalization No Has patient had a PCN reaction occurring within the last 10 years: Yes took again last year 2016 If all of the above answers are "NO", then may proceed with Cephalosporin use.   . Naprosyn [Naproxen] Nausea Only    diarrhea  . Sertraline Diarrhea  . Lactose Intolerance (Gi) Other (See Comments)    Unknown- [facility] documented    Allergies as of 01/17/2020      Reactions   Penicillins Hives   Has patient had a PCN  reaction causing immediate rash, facial/tongue/throat swelling, SOB or lightheadedness with hypotension: No Has patient had a PCN reaction causing severe rash involving mucus membranes or skin necrosis: Yes Has patient had a PCN reaction that required hospitalization No Has patient had a PCN reaction occurring within the last 10 years: Yes took again last year 2016 If all of the above answers are "NO", then may proceed with Cephalosporin use.   Naprosyn [naproxen] Nausea Only   diarrhea   Sertraline Diarrhea   Lactose Intolerance (gi) Other (See Comments)   Unknown- [facility] documented      Medication List       Accurate as of January 17, 2020 12:17 PM. If you have any questions, ask your nurse or doctor.        acetaminophen 325 MG tablet Commonly known as: TYLENOL Take 2 tablets (650 mg total) by mouth every 6 (six) hours as needed for mild pain or moderate pain.   acetaminophen 325 MG tablet Commonly known as: TYLENOL Take 650 mg by mouth every 6 (six) hours. For Back Pain   albuterol 108 (90 Base) MCG/ACT inhaler Commonly known as: VENTOLIN HFA Inhale 2 puffs into the lungs every 6 (six) hours as needed for wheezing (cough).   amLODipine 2.5 MG tablet Commonly known as: NORVASC Take 2.5 mg by mouth at bedtime.   aspirin 81 MG tablet Take 81 mg by mouth daily.   atorvastatin 10 MG tablet Commonly known as: LIPITOR Take 10 mg by mouth daily.   betamethasone dipropionate 0.05 % ointment Commonly known as: DIPROLENE Apply 1 application topically daily. Apply to right lower leg.   Breo Ellipta 200-25 MCG/INH Aepb Generic drug: fluticasone furoate-vilanterol Inhale 1 puff into the lungs daily. Rinse mouth out with water after using medication.   donepezil 10 MG tablet Commonly known as: ARICEPT Take 1 tablet by mouth once daily   DULoxetine 60 MG capsule Commonly known as: CYMBALTA Take 60 mg by mouth daily.   folic acid 1 MG  tablet Commonly known as: FOLVITE Take 1 tablet (1 mg total) by mouth daily.   irbesartan 300 MG tablet Commonly known as: AVAPRO Take 300 mg by mouth daily.   metoprolol tartrate 25 MG tablet Commonly known as: LOPRESSOR Take 1 tablet (25 mg total) by mouth 2 (two) times daily. Hold SBP<110, HR <60   pantoprazole 40 MG tablet Commonly known as: Protonix Take 1 tablet (40 mg total) by mouth 2 (two) times daily.   pentoxifylline 400 MG CR tablet Commonly known as: TRENTAL Take 400 mg by mouth 2 (two) times daily.   Salonpas Pain Relieving 4 % Ptch Generic drug: Lidocaine Apply 1 patch topically in the morning.       Review of Systems  Constitutional: Negative for appetite change, chills, fatigue and fever.  HENT: Negative for congestion, postnasal drip, rhinorrhea, sinus pressure, sinus pain, sneezing and sore throat.  Eyes: Negative for discharge, redness and itching.  Respiratory: Negative for cough, chest tightness, shortness of breath and wheezing.   Cardiovascular: Negative for chest pain, palpitations and leg swelling.  Gastrointestinal: Negative for abdominal distention, abdominal pain, constipation, diarrhea, nausea and vomiting.  Endocrine: Negative for heat intolerance, polydipsia, polyphagia and polyuria.  Genitourinary: Negative for difficulty urinating, dysuria, flank pain, frequency and urgency.  Musculoskeletal: Positive for arthralgias and gait problem. Negative for joint swelling and myalgias.  Skin: Negative for color change, pallor and rash.  Neurological: Negative for dizziness, speech difficulty, weakness, light-headedness, numbness and headaches.  Hematological: Does not bruise/bleed easily.  Psychiatric/Behavioral: Negative for agitation, behavioral problems and sleep disturbance. The patient is not nervous/anxious.     Immunization History  Administered Date(s) Administered  . Influenza, High Dose Seasonal PF 03/06/2019  . Influenza,inj,Quad PF,6+  Mos 04/25/2014  . PFIZER SARS-COV-2 Vaccination 06/11/2019, 07/02/2019   Pertinent  Health Maintenance Due  Topic Date Due  . DEXA SCAN  Never done  . PNA vac Low Risk Adult (1 of 2 - PCV13) Never done  . INFLUENZA VACCINE  12/16/2019   Fall Risk  12/14/2017 11/24/2017 11/08/2017 12/23/2016 12/13/2016  Falls in the past year? Yes Yes Yes Yes Yes  Number falls in past yr: 2 or more 2 or more 2 or more 1 1  Injury with Fall? No Yes (No Data) No No  Comment - - bruises - -  Risk Factor Category  - High Fall Risk - - -  Risk for fall due to : Impaired balance/gait;History of fall(s) History of fall(s);Impaired balance/gait Impaired balance/gait Impaired balance/gait Impaired balance/gait;History of fall(s)  Follow up Falls prevention discussed - - Falls prevention discussed -    Vitals:   01/17/20 1216  BP: 131/75  Pulse: 66  Resp: 18  Temp: (!) 97.5 F (36.4 C)  TempSrc: Oral  Weight: 127 lb (57.6 kg)  Height: 4\' 9"  (1.448 m)   Body mass index is 27.48 kg/m. Physical Exam Vitals and nursing note reviewed.  Constitutional:      General: She is not in acute distress.    Appearance: She is overweight. She is not ill-appearing.  HENT:     Head: Normocephalic.     Nose: Nose normal. No congestion or rhinorrhea.     Mouth/Throat:     Mouth: Mucous membranes are moist.     Pharynx: Oropharynx is clear. No oropharyngeal exudate or posterior oropharyngeal erythema.  Eyes:     General: No scleral icterus.       Right eye: No discharge.        Left eye: No discharge.     Conjunctiva/sclera: Conjunctivae normal.     Pupils: Pupils are equal, round, and reactive to light.  Neck:     Vascular: No carotid bruit.  Cardiovascular:     Rate and Rhythm: Normal rate and regular rhythm.     Pulses: Normal pulses.     Heart sounds: Normal heart sounds. No murmur heard.  No friction rub. No gallop.   Pulmonary:     Effort: Pulmonary effort is normal. No respiratory distress.     Breath  sounds: Normal breath sounds. No wheezing, rhonchi or rales.  Chest:     Chest wall: No tenderness.  Abdominal:     General: Bowel sounds are normal. There is no distension.     Palpations: Abdomen is soft. There is no mass.     Tenderness: There is no abdominal tenderness. There is no  right CVA tenderness, left CVA tenderness, guarding or rebound.  Musculoskeletal:        General: No swelling or tenderness.     Cervical back: Normal range of motion. No rigidity or tenderness.     Right lower leg: No edema.     Left lower leg: No edema.     Comments: Unsteady gait walks with Rolling walker with x 1 assist   Lymphadenopathy:     Cervical: No cervical adenopathy.  Skin:    General: Skin is warm and dry.     Coloration: Skin is not pale.     Findings: No bruising, erythema or rash.  Neurological:     Mental Status: She is alert. Mental status is at baseline.     Cranial Nerves: No cranial nerve deficit.     Sensory: No sensory deficit.     Motor: No weakness.     Coordination: Coordination normal.     Gait: Gait abnormal.  Psychiatric:        Mood and Affect: Mood normal.        Behavior: Behavior normal.        Thought Content: Thought content normal.        Judgment: Judgment normal.     Labs reviewed: Recent Labs    12/11/19 1929 12/11/19 2238 12/12/19 0445 12/12/19 0445 12/13/19 0319 12/13/19 0319 12/14/19 0307 12/15/19 0346 12/24/19 0000  NA   < >  --  142   < > 139   < > 140 141 143  K   < >  --  3.7   < > 3.8   < > 3.9 3.8 4.0  CL   < >  --  112*   < > 111   < > 109 106 111*  CO2   < >  --  20*   < > 21*   < > 23 25 18   GLUCOSE   < >  --  111*   < > 112*  --  104* 102*  --   BUN   < >  --  25*   < > 20   < > 20 20 12   CREATININE   < >  --  0.81   < > 0.71   < > 0.82 0.78 0.8  CALCIUM   < >  --  8.2*   < > 8.5*   < > 8.4* 9.1 8.8  MG  --  2.3 2.2  --   --   --   --   --   --   PHOS  --  3.3 2.8  --   --   --   --   --   --    < > = values in this interval  not displayed.   Recent Labs    11/06/19 2113 12/11/19 1929 12/12/19 0445  AST 655* 35 37  ALT 289* 34 30  ALKPHOS 134* 67 59  BILITOT 1.7* 1.0 0.7  PROT 6.5 6.5 5.9*  ALBUMIN 3.3* 3.4* 2.9*   Recent Labs    12/11/19 1929 12/11/19 1929 12/12/19 0445 12/12/19 0445 12/13/19 0319 12/13/19 0319 12/14/19 0307 12/15/19 0346 12/24/19 0000  WBC 12.4*   < > 12.0*   < > 10.5   < > 9.3 10.6* 9.1  NEUTROABS 9.5*  --  9.4*  --   --   --   --   --   --   HGB 11.4*   < >  11.3*   < > 11.1*   < > 11.1* 11.4* 10.1*  HCT 36.7   < > 36.0   < > 34.7*   < > 34.9* 35.8* 31*  MCV 91.3   < > 92.1   < > 88.7  --  89.9 90.2  --   PLT 176   < > 176   < > 167   < > 188 205 297   < > = values in this interval not displayed.   Lab Results  Component Value Date   TSH 0.473 12/12/2019   Lab Results  Component Value Date   HGBA1C  07/12/2009    5.3 (NOTE) The ADA recommends the following therapeutic goal for glycemic control related to Hgb A1c measurement: Goal of therapy: <6.5 Hgb A1c  Reference: American Diabetes Association: Clinical Practice Recommendations 2010, Diabetes Care, 2010, 33: (Suppl  1).   Lab Results  Component Value Date   CHOL 118 01/03/2016   HDL 43 01/03/2016   LDLCALC 54 01/03/2016   TRIG 106 01/03/2016   CHOLHDL 2.7 01/03/2016    Significant Diagnostic Results in last 30 days:  No results found.  Assessment/Plan 1. Benign essential HTN B/p well controlled. - continue on amlodipine,irbesartan and metoprolol  - continue on ASA and Statin for cardiovascular event prophylaxis.   2. Chronic diastolic CHF (congestive heart failure) (HCC) No signs of fluid overload. Not on diuretic  3. PAF (paroxysmal atrial fibrillation) (HCC) HR controlled. - continue on ASA and Metoprolol  4. Coronary artery disease involving native coronary artery of native heart with angina pectoris (HCC) Chest pain free.continue to control high risk factors.  5. Gastroesophageal reflux  disease without esophagitis Symptoms controlled. Continue on Protonix 40 mg tablet twice daily. H/H stable.    6. Dementia without behavioral disturbance, unspecified dementia type ([place]) No new behavioral issues makes needs known to staff Continue on donepezil and supportive care.   7. Hyperlipidemia with target LDL less than 70 Latest LDL on chart 54 at goal . - continue on atorvastatin   Family/ staff Communication: Reviewed plan of care with patient and facility Nurse   Labs/tests ordered: None

## 2020-01-21 NOTE — Progress Notes (Deleted)
Cardiology Office Note   Date:  01/21/2020   ID:  Tammy, [name] [birth year], MRN [id number]  PCP:  [name], MD Cardiologist:  Shelva Majestic, MD 06/22/2019 Electrphysiologist: None Tammy Ferries, PA-C   No chief complaint on file.   History of Present Illness: Tammy [name] is a 86 y.o. female with a history of dementia, hypertension, CAD, pulmonary hypertension, COPD, occasionally on O2, A. fib no longer on anticoagulation, essential tremor   Admitted 07/27-08/05/2019 after a fall, on the floor ?2 days. Encephalopathy, EF nl, anticoag d/c'd 2nd falls, Avapro and Norvasc d/c'd as not sure BP tolerates, d/c to [facility] notes show at least 1 fall there.  Tammy [name] presents for ***  COVID status: vaccinated, did not have COVID Past Medical History:  Diagnosis Date  . Acid reflux   . Asthma    Asthmatic COPD  . Atrial fibrillation ([place]) 01/01/2016  . Coronary artery disease    Echo 07/27/2011   . Coronary artery stenosis    , High-grade left main  . Dementia ([place])   . Essential tremor 03/16/2018  . GERD (gastroesophageal reflux disease)   . Hyperlipidemia   . Hypertension   . IBS (irritable bowel syndrome)   . Psoriasis     Past Surgical History:  Procedure Laterality Date  . CARDIAC SURGERY    . CORONARY ARTERY BYPASS GRAFT  1994   After catheterization showed 90% ostial left main stenosis  . DOPPLER ECHOCARDIOGRAPHY     Study showed mild to moderate MR with moderate TR, mild to moderate pulmonary hypertension with an ejection fraction greater that 55%.   [name] EYE SURGERY    . Nuclear Perfusion  10/2010, 11/15/2012   Showed normal perfusion, No Ischemia or Infarction, Normal EF  . Renal Scan Duplex  04/09/2012   Showing greater than 50% diameter reduction in the celiac artery and SMA.. Right proximal 275 and Mid 152.  [name] ROTATOR CUFF REPAIR     bilateral  . Sleep Study  12/28/2006   AHI during total sleep time (3h 19 minutes) was  1.81/hr and during REM sleep at 17.78/hr. Mild sleep apnea during REM sleep.Oxygen staturated rate during REM and NREM was 93.0%.    Current Outpatient Medications  Medication Sig Dispense Refill  . acetaminophen (TYLENOL) 325 MG tablet Take 2 tablets (650 mg total) by mouth every 6 (six) hours as needed for mild pain or moderate pain.    [name] acetaminophen (TYLENOL) 325 MG tablet Take 650 mg by mouth every 6 (six) hours. For Back Pain    . albuterol (VENTOLIN HFA) 108 (90 Base) MCG/ACT inhaler Inhale 2 puffs into the lungs every 6 (six) hours as needed for wheezing (cough).    [name] amLODipine (NORVASC) 2.5 MG tablet Take 2.5 mg by mouth at bedtime.    [name] aspirin 81 MG tablet Take 81 mg by mouth daily.     [name] atorvastatin (LIPITOR) 10 MG tablet Take 10 mg by mouth daily.    . betamethasone dipropionate (DIPROLENE) 0.05 % ointment Apply 1 application topically daily. Apply to right lower leg.    . donepezil (ARICEPT) 10 MG tablet Take 1 tablet by mouth once daily 90 tablet 0  . DULoxetine (CYMBALTA) 60 MG capsule Take 60 mg by mouth daily.   2  . fluticasone furoate-vilanterol (BREO ELLIPTA) 200-25 MCG/INH AEPB Inhale 1 puff into the lungs daily. Rinse mouth out with water after using medication.    [name]  folic acid (FOLVITE) 1 MG tablet Take 1 tablet (1 mg total) by mouth daily.    . irbesartan (AVAPRO) 300 MG tablet Take 300 mg by mouth daily.    . Lidocaine (SALONPAS PAIN RELIEVING) 4 % PTCH Apply 1 patch topically in the morning.    . metoprolol tartrate (LOPRESSOR) 25 MG tablet Take 1 tablet (25 mg total) by mouth 2 (two) times daily. Hold SBP<110, HR <60    . pantoprazole (PROTONIX) 40 MG tablet Take 1 tablet (40 mg total) by mouth 2 (two) times daily. 60 tablet 5  . pentoxifylline (TRENTAL) 400 MG CR tablet Take 400 mg by mouth 2 (two) times daily.     No current facility-administered medications for this visit.    Allergies:   Penicillins, Naprosyn [naproxen], Sertraline, and Lactose intolerance  (gi)    Social History:  The patient  reports that she quit smoking about 57 years ago. She has a 18.00 pack-year smoking history. She has never used smokeless tobacco. She reports that she does not drink alcohol and does not use drugs.   Family History:  The patient's family history includes Cancer in her brother; Colon cancer in her brother; Heart disease in her mother; Hypertension in her sister.  She indicated that her mother is deceased. She indicated that her father is deceased. She indicated that her sister is alive. She indicated that her brother is deceased.    ROS:  Please see the history of present illness. All other systems are reviewed and negative.    PHYSICAL EXAM: VS:  There were no vitals taken for this visit. , BMI There is no height or weight on file to calculate BMI. GEN: Well nourished, well developed, female in no acute distress HEENT: normal for age  Neck: no JVD, no carotid bruit, no masses Cardiac: RRR; no murmur, no rubs, or gallops Respiratory:  clear to auscultation bilaterally, normal work of breathing GI: soft, nontender, nondistended, + BS MS: no deformity or atrophy; no edema; distal pulses are 2+ in all 4 extremities  Skin: warm and dry, no rash Neuro:  Strength and sensation are intact Psych: euthymic mood, full affect   EKG:  EKG {ACTION; IS/IS EGB:15176160} ordered today. The ekg ordered today demonstrates ***  ECHO: ***  CATH: ***  MONITOR: ***   Recent Labs: 12/11/2019: B Natriuretic Peptide 105.3 12/12/2019: ALT 30; Magnesium 2.2; TSH 0.473 12/24/2019: BUN 12; Creatinine 0.8; Hemoglobin 10.1; Platelets 297; Potassium 4.0; Sodium 143  CBC    Component Value Date/Time   WBC 9.1 12/24/2019 0000   WBC 10.6 (H) 12/15/2019 0346   RBC 3.52 (A) 12/24/2019 0000   HGB 10.1 (A) 12/24/2019 0000   HCT 31 (A) 12/24/2019 0000   PLT 297 12/24/2019 0000   MCV 90.2 12/15/2019 0346   MCH 28.7 12/15/2019 0346   MCHC 31.8 12/15/2019 0346   RDW 14.3  12/15/2019 0346   LYMPHSABS 1.3 12/12/2019 0445   MONOABS 1.1 (H) 12/12/2019 0445   EOSABS 0.2 12/12/2019 0445   BASOSABS 0.0 12/12/2019 0445   CMP Latest Ref Rng & Units 12/24/2019 12/15/2019 12/14/2019  Glucose 70 - 99 mg/dL - 102(H) 104(H)  BUN 4 - 21 12 20 20   Creatinine 0.5 - 1.1 0.8 0.78 0.82  Sodium 137 - 147 143 141 140  Potassium 3.4 - 5.3 4.0 3.8 3.9  Chloride 99 - 108 111(A) 106 109  CO2 13 - 22 18 25 23   Calcium 8.7 - 10.7 8.8 9.1 8.4(L)  Total  Protein 6.5 - 8.1 g/dL - - -  Total Bilirubin 0.3 - 1.2 mg/dL - - -  Alkaline Phos 38 - 126 U/L - - -  AST 15 - 41 U/L - - -  ALT 0 - 44 U/L - - -     Lipid Panel Lab Results  Component Value Date   CHOL 118 01/03/2016   HDL 43 01/03/2016   LDLCALC 54 01/03/2016   TRIG 106 01/03/2016   CHOLHDL 2.7 01/03/2016      Wt Readings from Last 3 Encounters:  01/17/20 127 lb (57.6 kg)  12/27/19 128 lb (58.1 kg)  12/18/19 128 lb (58.1 kg)     Other studies Reviewed: Additional studies/ records that were reviewed today include: Office notes, hospital records and testing.  ASSESSMENT AND PLAN:  1.  ***   Current medicines are reviewed at length with the patient today.  The patient {ACTIONS; HAS/DOES NOT HAVE:19233} concerns regarding medicines.  The following changes have been made:  {PLAN; NO CHANGE:13088:s}  Labs/ tests ordered today include:  No orders of the defined types were placed in this encounter.    Disposition:   FU with Shelva Majestic, MD  Signed, Tammy Ferries, PA-C  01/21/2020 6:36 PM    [place] Phone: [phone]; Fax: [phone]

## 2020-01-25 ENCOUNTER — Ambulatory Visit: Payer: PPO | Admitting: Physician Assistant

## 2020-01-25 ENCOUNTER — Other Ambulatory Visit: Payer: Self-pay | Admitting: Physical Medicine and Rehabilitation

## 2020-01-25 DIAGNOSIS — Z03818 Encounter for observation for suspected exposure to other biological agents ruled out: Secondary | ICD-10-CM | POA: Diagnosis not present

## 2020-01-25 DIAGNOSIS — M25552 Pain in left hip: Secondary | ICD-10-CM

## 2020-01-31 ENCOUNTER — Encounter: Payer: Self-pay | Admitting: Family

## 2020-01-31 ENCOUNTER — Non-Acute Institutional Stay (SKILLED_NURSING_FACILITY): Payer: PPO | Admitting: Family

## 2020-01-31 DIAGNOSIS — I5032 Chronic diastolic (congestive) heart failure: Secondary | ICD-10-CM

## 2020-01-31 DIAGNOSIS — R600 Localized edema: Secondary | ICD-10-CM

## 2020-01-31 NOTE — Progress Notes (Signed)
Location:    [place].   Nursing Home Room Number: 100-P Place of Service:  SNF (31) Provider:  Marlowe Sax, NP    Patient Care Team: [name], MD as PCP - General (Family Medicine) [name], MD as PCP - Cardiology (Cardiology)  Extended Emergency Contact Information Primary Emergency Contact: [name],[name] Address: [address]          [place] [address] [place] Phone: [phone] Relation: Daughter  Code Status:  FULL CODE Goals of care: Advanced Directive information Advanced Directives 01/31/2020  Does Patient Have a Medical Advance Directive? Yes  Type of Advance Directive [place]  Does patient want to make changes to medical advance directive? No - Patient declined  Copy of [place] in Chart? Yes - validated most recent copy scanned in chart (See row information)  Would patient like information on creating a medical advance directive? -     Chief Complaint  Patient presents with  . Acute Visit    Bilateral lower extremities edema and wheeping.    HPI:  Pt is a 86 y.o. female seen today for an acute visit for evaluation of lower extremities edema.she is seen in her room sitting at bedside.she complains of worsening swelling on both legs.States legs are weeping but has no open areas.states has had water pill in the past for leg swelling.she states thought she was doing well with therapy " something else has come up".she is eager to go home to take care of her Dog which is blind.Daughter currently taking care of it.  She denies any cough,wheezing or shortness of breath. Has had 3 lbs weight gain over two weeks.she has a significant medical management of congestive heart failure.    Past Medical History:  Diagnosis Date  . Acid reflux   . Asthma    Asthmatic COPD  . Atrial fibrillation ([place]) 01/01/2016  . Coronary artery disease    Echo 07/27/2011   . Coronary artery  stenosis    , High-grade left main  . Dementia ([place])   . Essential tremor 03/16/2018  . GERD (gastroesophageal reflux disease)   . Hyperlipidemia   . Hypertension   . IBS (irritable bowel syndrome)   . Psoriasis    Past Surgical History:  Procedure Laterality Date  . CARDIAC SURGERY    . CORONARY ARTERY BYPASS GRAFT  1994   After catheterization showed 90% ostial left main stenosis  . DOPPLER ECHOCARDIOGRAPHY     Study showed mild to moderate MR with moderate TR, mild to moderate pulmonary hypertension with an ejection fraction greater that 55%.   [name] EYE SURGERY    . Nuclear Perfusion  10/2010, 11/15/2012   Showed normal perfusion, No Ischemia or Infarction, Normal EF  . Renal Scan Duplex  04/09/2012   Showing greater than 50% diameter reduction in the celiac artery and SMA.. Right proximal 275 and Mid 152.  [name] ROTATOR CUFF REPAIR     bilateral  . Sleep Study  12/28/2006   AHI during total sleep time (3h 19 minutes) was 1.81/hr and during REM sleep at 17.78/hr. Mild sleep apnea during REM sleep.Oxygen staturated rate during REM and NREM was 93.0%.    Allergies  Allergen Reactions  . Penicillins Hives    Has patient had a PCN reaction causing immediate rash, facial/tongue/throat swelling, SOB or lightheadedness with hypotension: No Has patient had a PCN reaction causing severe rash involving mucus membranes or skin necrosis:  Yes Has patient had a PCN reaction that required hospitalization No Has patient had a PCN reaction occurring within the last 10 years: Yes took again last year 2016 If all of the above answers are "NO", then may proceed with Cephalosporin use.   . Naprosyn [Naproxen] Nausea Only    diarrhea  . Sertraline Diarrhea  . Lactose Intolerance (Gi) Other (See Comments)    Unknown- [facility] of [facility] documented    Allergies as of 01/31/2020      Reactions   Penicillins Hives   Has patient had a PCN reaction causing immediate rash,  facial/tongue/throat swelling, SOB or lightheadedness with hypotension: No Has patient had a PCN reaction causing severe rash involving mucus membranes or skin necrosis: Yes Has patient had a PCN reaction that required hospitalization No Has patient had a PCN reaction occurring within the last 10 years: Yes took again last year 2016 If all of the above answers are "NO", then may proceed with Cephalosporin use.   Naprosyn [naproxen] Nausea Only   diarrhea   Sertraline Diarrhea   Lactose Intolerance (gi) Other (See Comments)   Unknown- [facility] Health of [facility] documented      Medication List       Accurate as of January 31, 2020  2:23 PM. If you have any questions, ask your nurse or doctor.        acetaminophen 325 MG tablet Commonly known as: TYLENOL Take 2 tablets (650 mg total) by mouth every 6 (six) hours as needed for mild pain or moderate pain.   acetaminophen 325 MG tablet Commonly known as: TYLENOL Take 650 mg by mouth every 6 (six) hours. For Back Pain   albuterol 108 (90 Base) MCG/ACT inhaler Commonly known as: VENTOLIN HFA Inhale 2 puffs into the lungs every 6 (six) hours as needed for wheezing (cough).   amLODipine 2.5 MG tablet Commonly known as: NORVASC Take 2.5 mg by mouth at bedtime.   aspirin 81 MG tablet Take 81 mg by mouth daily.   atorvastatin 10 MG tablet Commonly known as: LIPITOR Take 10 mg by mouth daily.   betamethasone dipropionate 0.05 % ointment Commonly known as: DIPROLENE Apply 1 application topically daily. Apply to right lower leg.   Breo Ellipta 200-25 MCG/INH Aepb Generic drug: fluticasone furoate-vilanterol Inhale 1 puff into the lungs daily. Rinse mouth out with water after using medication.   donepezil 10 MG tablet Commonly known as: ARICEPT Take 1 tablet by mouth once daily   DULoxetine 60 MG capsule Commonly known as: CYMBALTA Take 60 mg by mouth daily.   folic acid 1 MG tablet Commonly known as: FOLVITE Take 1  tablet (1 mg total) by mouth daily.   irbesartan 300 MG tablet Commonly known as: AVAPRO Take 300 mg by mouth daily.   metoprolol tartrate 25 MG tablet Commonly known as: LOPRESSOR Take 1 tablet (25 mg total) by mouth 2 (two) times daily. Hold SBP<110, HR <60   pantoprazole 40 MG tablet Commonly known as: Protonix Take 1 tablet (40 mg total) by mouth 2 (two) times daily.   pentoxifylline 400 MG CR tablet Commonly known as: TRENTAL Take 400 mg by mouth 2 (two) times daily.   Salonpas Pain Relieving 4 % Ptch Generic drug: Lidocaine Apply 1 patch topically in the morning.       Review of Systems  Constitutional: Negative for appetite change, chills, fatigue and fever.  HENT: Negative for congestion, rhinorrhea, sinus pressure, sinus pain, sneezing and sore throat.  Respiratory: Negative for cough, chest tightness, shortness of breath and wheezing.   Cardiovascular: Positive for leg swelling. Negative for chest pain and palpitations.  Gastrointestinal: Negative for abdominal distention, abdominal pain, constipation, diarrhea, nausea and vomiting.  Musculoskeletal: Positive for arthralgias and gait problem.  Skin: Negative for color change, pallor, rash and wound.  Neurological: Negative for dizziness, speech difficulty, weakness, light-headedness and headaches.  Psychiatric/Behavioral: Negative for agitation, behavioral problems and sleep disturbance. The patient is not nervous/anxious.     Immunization History  Administered Date(s) Administered  . Influenza, High Dose Seasonal PF 03/06/2019  . Influenza,inj,Quad PF,6+ Mos 04/25/2014  . PFIZER SARS-COV-2 Vaccination 06/11/2019, 07/02/2019   Pertinent  Health Maintenance Due  Topic Date Due  . DEXA SCAN  Never done  . PNA vac Low Risk Adult (1 of 2 - PCV13) Never done  . INFLUENZA VACCINE  12/16/2019   Fall Risk  12/14/2017 11/24/2017 11/08/2017 12/23/2016 12/13/2016  Falls in the past year? Yes Yes Yes Yes Yes  Number falls  in past yr: 2 or more 2 or more 2 or more 1 1  Injury with Fall? No Yes (No Data) No No  Comment - - bruises - -  Risk Factor Category  - High Fall Risk - - -  Risk for fall due to : Impaired balance/gait;History of fall(s) History of fall(s);Impaired balance/gait Impaired balance/gait Impaired balance/gait Impaired balance/gait;History of fall(s)  Follow up Falls prevention discussed - - Falls prevention discussed -    Vitals:   01/31/20 1415  BP: 125/79  Pulse: 77  Resp: 18  Temp: (!) 97.3 F (36.3 C)  Weight: 130 lb 3.2 oz (59.1 kg)  Height: 4\' 9"  (1.448 m)   Body mass index is 28.18 kg/m. Physical Exam Vitals and nursing note reviewed.  Constitutional:      General: She is not in acute distress.    Appearance: She is overweight. She is not ill-appearing.  HENT:     Head: Normocephalic.     Nose: Nose normal. No congestion or rhinorrhea.     Mouth/Throat:     Mouth: Mucous membranes are moist.     Pharynx: Oropharynx is clear. No oropharyngeal exudate or posterior oropharyngeal erythema.  Eyes:     General: No scleral icterus.       Right eye: No discharge.        Left eye: No discharge.     Conjunctiva/sclera: Conjunctivae normal.     Pupils: Pupils are equal, round, and reactive to light.  Cardiovascular:     Pulses: Normal pulses.     Heart sounds: Normal heart sounds. No murmur heard.  No friction rub. No gallop.   Pulmonary:     Effort: Pulmonary effort is normal. No respiratory distress.     Breath sounds: Normal breath sounds. No wheezing, rhonchi or rales.  Chest:     Chest wall: No tenderness.  Abdominal:     General: Bowel sounds are normal. There is no distension.     Palpations: Abdomen is soft. There is no mass.     Tenderness: There is no abdominal tenderness. There is no right CVA tenderness, left CVA tenderness, guarding or rebound.  Musculoskeletal:        General: No tenderness.     Right lower leg: Edema present.     Left lower leg: Edema  present.     Comments: Unsteady gait walks with Rolling walker  Skin:    General: Skin is warm and dry.  Coloration: Skin is not pale.     Findings: No bruising.     Comments: Bilateral lower extremities shiny pink skin non-tender   Neurological:     Mental Status: She is alert. Mental status is at baseline.     Cranial Nerves: No cranial nerve deficit.     Motor: No weakness.     Coordination: Coordination normal.     Gait: Gait abnormal.  Psychiatric:        Mood and Affect: Mood normal.        Behavior: Behavior normal.        Thought Content: Thought content normal.        Judgment: Judgment normal.     Labs reviewed: Recent Labs    12/11/19 1929 12/11/19 2238 12/12/19 0445 12/12/19 0445 12/13/19 0319 12/13/19 0319 12/14/19 0307 12/15/19 0346 12/24/19 0000  NA   < >  --  142   < > 139   < > 140 141 143  K   < >  --  3.7   < > 3.8   < > 3.9 3.8 4.0  CL   < >  --  112*   < > 111   < > 109 106 111*  CO2   < >  --  20*   < > 21*   < > 23 25 18   GLUCOSE   < >  --  111*   < > 112*  --  104* 102*  --   BUN   < >  --  25*   < > 20   < > 20 20 12   CREATININE   < >  --  0.81   < > 0.71   < > 0.82 0.78 0.8  CALCIUM   < >  --  8.2*   < > 8.5*   < > 8.4* 9.1 8.8  MG  --  2.3 2.2  --   --   --   --   --   --   PHOS  --  3.3 2.8  --   --   --   --   --   --    < > = values in this interval not displayed.   Recent Labs    11/06/19 2113 12/11/19 1929 12/12/19 0445  AST 655* 35 37  ALT 289* 34 30  ALKPHOS 134* 67 59  BILITOT 1.7* 1.0 0.7  PROT 6.5 6.5 5.9*  ALBUMIN 3.3* 3.4* 2.9*   Recent Labs    12/11/19 1929 12/11/19 1929 12/12/19 0445 12/12/19 0445 12/13/19 0319 12/13/19 0319 12/14/19 0307 12/15/19 0346 12/24/19 0000  WBC 12.4*   < > 12.0*   < > 10.5   < > 9.3 10.6* 9.1  NEUTROABS 9.5*  --  9.4*  --   --   --   --   --   --   HGB 11.4*   < > 11.3*   < > 11.1*   < > 11.1* 11.4* 10.1*  HCT 36.7   < > 36.0   < > 34.7*   < > 34.9* 35.8* 31*  MCV 91.3   < >  92.1   < > 88.7  --  89.9 90.2  --   PLT 176   < > 176   < > 167   < > 188 205 297   < > = values in this interval not displayed.   Lab Results  Component Value Date  TSH 0.473 12/12/2019   Lab Results  Component Value Date   HGBA1C  07/12/2009    5.3 (NOTE) The ADA recommends the following therapeutic goal for glycemic control related to Hgb A1c measurement: Goal of therapy: <6.5 Hgb A1c  Reference: American Diabetes Association: Clinical Practice Recommendations 2010, Diabetes Care, 2010, 33: (Suppl  1).   Lab Results  Component Value Date   CHOL 118 01/03/2016   HDL 43 01/03/2016   LDLCALC 54 01/03/2016   TRIG 106 01/03/2016   CHOLHDL 2.7 01/03/2016    Significant Diagnostic Results in last 30 days:  No results found.  Assessment/Plan  1. Chronic diastolic CHF (congestive heart failure) (HCC) Has had 3 lbs weight gain with worsening bilateral lower extremities.No cough,wheezing or shortness of breath noted.  - off diuretic  - Start furosemide 40 mg tablet one by mouth daily x 3 days then reduce to 20 mg tablet daily  - Potassium chloride 20 mg tablet one by mouth daily  - monitor weight three times per week per facility protocol notify provider for abrupt weight gain  - BMP in 1 week  2. Bilateral lower extremity edema Weight gain as above.bilateral Lungs clear. - furosemide as above - Encouraged to keep legs elevated when seated.request recliner to be moved closer to her bedside table where she can rich her stuff.Nurse Notify for recliner to be moved closer so that she had keep her legs elevated. - knee high compression stocking on in the morning and off at bedtime - continue to monitor weight and signs of fluid overload  Family/ staff Communication: Reviewed plan of care with patient and facility Nurse   Labs/tests ordered:  - BMP in 1 week

## 2020-02-04 DIAGNOSIS — J4540 Moderate persistent asthma, uncomplicated: Secondary | ICD-10-CM | POA: Diagnosis not present

## 2020-02-04 DIAGNOSIS — E782 Mixed hyperlipidemia: Secondary | ICD-10-CM | POA: Diagnosis not present

## 2020-02-04 DIAGNOSIS — I10 Essential (primary) hypertension: Secondary | ICD-10-CM | POA: Diagnosis not present

## 2020-02-04 DIAGNOSIS — N183 Chronic kidney disease, stage 3 unspecified: Secondary | ICD-10-CM | POA: Diagnosis not present

## 2020-02-04 DIAGNOSIS — F0390 Unspecified dementia without behavioral disturbance: Secondary | ICD-10-CM | POA: Diagnosis not present

## 2020-02-04 DIAGNOSIS — F324 Major depressive disorder, single episode, in partial remission: Secondary | ICD-10-CM | POA: Diagnosis not present

## 2020-02-04 DIAGNOSIS — J439 Emphysema, unspecified: Secondary | ICD-10-CM | POA: Diagnosis not present

## 2020-02-04 DIAGNOSIS — M810 Age-related osteoporosis without current pathological fracture: Secondary | ICD-10-CM | POA: Diagnosis not present

## 2020-02-05 ENCOUNTER — Ambulatory Visit
Admission: RE | Admit: 2020-02-05 | Discharge: 2020-02-05 | Disposition: A | Discharge status: Home / Self Care | Payer: PPO | Source: Ambulatory Visit | Attending: Physical Medicine and Rehabilitation | Admitting: Physical Medicine and Rehabilitation

## 2020-02-05 ENCOUNTER — Other Ambulatory Visit: Payer: Self-pay

## 2020-02-05 DIAGNOSIS — S32592D Other specified fracture of left pubis, subsequent encounter for fracture with routine healing: Secondary | ICD-10-CM | POA: Diagnosis not present

## 2020-02-05 DIAGNOSIS — M25552 Pain in left hip: Secondary | ICD-10-CM

## 2020-02-05 DIAGNOSIS — K5730 Diverticulosis of large intestine without perforation or abscess without bleeding: Secondary | ICD-10-CM | POA: Diagnosis not present

## 2020-02-05 DIAGNOSIS — S3219XD Other fracture of sacrum, subsequent encounter for fracture with routine healing: Secondary | ICD-10-CM | POA: Diagnosis not present

## 2020-02-05 DIAGNOSIS — R296 Repeated falls: Secondary | ICD-10-CM | POA: Diagnosis not present

## 2020-02-08 DIAGNOSIS — I10 Essential (primary) hypertension: Secondary | ICD-10-CM | POA: Diagnosis not present

## 2020-02-08 LAB — COMPREHENSIVE METABOLIC PANEL
Calcium: 9.5 (ref 8.7–10.7)
GFR calc Af Amer: 77.94
GFR calc non Af Amer: 67.25

## 2020-02-08 LAB — BASIC METABOLIC PANEL
BUN: 14 (ref 4–21)
CO2: 18 (ref 13–22)
Chloride: 106 (ref 99–108)
Creatinine: 0.8 (ref 0.5–1.1)
Glucose: 104
Potassium: 4.2 (ref 3.4–5.3)
Sodium: 143 (ref 137–147)

## 2020-02-11 ENCOUNTER — Non-Acute Institutional Stay (SKILLED_NURSING_FACILITY): Payer: PPO | Admitting: Family

## 2020-02-11 ENCOUNTER — Encounter: Payer: Self-pay | Admitting: Family

## 2020-02-11 DIAGNOSIS — R296 Repeated falls: Secondary | ICD-10-CM

## 2020-02-11 DIAGNOSIS — F332 Major depressive disorder, recurrent severe without psychotic features: Secondary | ICD-10-CM

## 2020-02-11 DIAGNOSIS — I25119 Atherosclerotic heart disease of native coronary artery with unspecified angina pectoris: Secondary | ICD-10-CM

## 2020-02-11 DIAGNOSIS — I272 Pulmonary hypertension, unspecified: Secondary | ICD-10-CM | POA: Diagnosis not present

## 2020-02-11 DIAGNOSIS — E785 Hyperlipidemia, unspecified: Secondary | ICD-10-CM

## 2020-02-11 DIAGNOSIS — I5032 Chronic diastolic (congestive) heart failure: Secondary | ICD-10-CM

## 2020-02-11 DIAGNOSIS — J4540 Moderate persistent asthma, uncomplicated: Secondary | ICD-10-CM

## 2020-02-11 DIAGNOSIS — R1032 Left lower quadrant pain: Secondary | ICD-10-CM | POA: Diagnosis not present

## 2020-02-11 DIAGNOSIS — R600 Localized edema: Secondary | ICD-10-CM

## 2020-02-11 DIAGNOSIS — K219 Gastro-esophageal reflux disease without esophagitis: Secondary | ICD-10-CM | POA: Diagnosis not present

## 2020-02-11 DIAGNOSIS — I480 Paroxysmal atrial fibrillation: Secondary | ICD-10-CM | POA: Diagnosis not present

## 2020-02-11 DIAGNOSIS — F0390 Unspecified dementia without behavioral disturbance: Secondary | ICD-10-CM | POA: Diagnosis not present

## 2020-02-11 DIAGNOSIS — I10 Essential (primary) hypertension: Secondary | ICD-10-CM | POA: Diagnosis not present

## 2020-02-11 DIAGNOSIS — R2681 Unsteadiness on feet: Secondary | ICD-10-CM | POA: Diagnosis not present

## 2020-02-11 MED ORDER — DONEPEZIL HCL 10 MG PO TABS: 10.0000 mg | ORAL_TABLET | Freq: Every day | ORAL | 0 refills | Status: DC

## 2020-02-11 MED ORDER — DULOXETINE HCL 60 MG PO CPEP: 60.0000 mg | ORAL_CAPSULE | Freq: Every day | ORAL | 0 refills | Status: DC

## 2020-02-11 MED ORDER — POTASSIUM CHLORIDE 20 MEQ PO PACK: 20.0000 meq | PACK | Freq: Every day | ORAL | 0 refills | Status: DC

## 2020-02-11 MED ORDER — AMLODIPINE BESYLATE 2.5 MG PO TABS: 2.5000 mg | ORAL_TABLET | Freq: Every day | ORAL | 0 refills | Status: DC

## 2020-02-11 MED ORDER — IRBESARTAN 300 MG PO TABS: 300.0000 mg | ORAL_TABLET | Freq: Every day | ORAL | 0 refills | Status: DC

## 2020-02-11 MED ORDER — PENTOXIFYLLINE ER 400 MG PO TBCR
400.0000 mg | EXTENDED_RELEASE_TABLET | Freq: Two times a day (BID) | ORAL | 0 refills | Status: DC
Start: 2020-02-11 — End: 2023-05-19

## 2020-02-11 MED ORDER — BREO ELLIPTA 200-25 MCG/INH IN AEPB: 1.0000 | INHALATION_SPRAY | Freq: Every day | RESPIRATORY_TRACT | 0 refills | Status: DC

## 2020-02-11 MED ORDER — BETAMETHASONE DIPROPIONATE 0.05 % EX OINT: 1.0000 "application " | TOPICAL_OINTMENT | Freq: Every day | CUTANEOUS | 0 refills | Status: DC

## 2020-02-11 MED ORDER — ATORVASTATIN CALCIUM 10 MG PO TABS: 10.0000 mg | ORAL_TABLET | Freq: Every day | ORAL | 0 refills | Status: AC

## 2020-02-11 MED ORDER — PANTOPRAZOLE SODIUM 40 MG PO TBEC: 40.0000 mg | DELAYED_RELEASE_TABLET | Freq: Two times a day (BID) | ORAL | 0 refills | Status: DC

## 2020-02-11 MED ORDER — FUROSEMIDE 20 MG PO TABS
20.0000 mg | ORAL_TABLET | Freq: Every day | ORAL | 0 refills | Status: AC
Start: 2020-02-11 — End: ?

## 2020-02-11 MED ORDER — ALBUTEROL SULFATE HFA 108 (90 BASE) MCG/ACT IN AERS: 2.0000 | INHALATION_SPRAY | Freq: Four times a day (QID) | RESPIRATORY_TRACT | 0 refills | Status: AC | PRN

## 2020-02-11 MED ORDER — METOPROLOL TARTRATE 25 MG PO TABS: 25.0000 mg | ORAL_TABLET | Freq: Two times a day (BID) | ORAL | 0 refills | Status: DC

## 2020-02-11 NOTE — Progress Notes (Signed)
Location:  [place] Room Number: 100-P Place of Service:  SNF [phone])  Provider: Marlowe Sax FNP-C   PCP: [name], MD Patient Care Team: [name], MD as PCP - General (Family Medicine) [name], MD as PCP - Cardiology (Cardiology)  Extended Emergency Contact Information Primary Emergency Contact: [name],[name] Address: [address]          [place] [address] [place] Phone: [phone] Relation: Daughter  Code Status: Full Code  Goals of care:  Advanced Directive information Advanced Directives 02/11/2020  Does Patient Have a Medical Advance Directive? Yes  Type of Advance Directive [place]  Does patient want to make changes to medical advance directive? No - Patient declined  Copy of [place] in Chart? Yes - validated most recent copy scanned in chart (See row information)  Would patient like information on creating a medical advance directive? -     Allergies  Allergen Reactions   Penicillins Hives    Has patient had a PCN reaction causing immediate rash, facial/tongue/throat swelling, SOB or lightheadedness with hypotension: No Has patient had a PCN reaction causing severe rash involving mucus membranes or skin necrosis: Yes Has patient had a PCN reaction that required hospitalization No Has patient had a PCN reaction occurring within the last 10 years: Yes took again last year 2016 If all of the above answers are "NO", then may proceed with Cephalosporin use.    Naprosyn [Naproxen] Nausea Only    diarrhea   Sertraline Diarrhea   Lactose Intolerance (Gi) Other (See Comments)    Unknown- [facility] of [facility] documented    Chief Complaint  Patient presents with   Discharge Note    Discharge home 02/11/2020    HPI:  86 y.o. female seen today at [facility] for discharge home 02/11/2020.she has a medical  history of Hypertension,Coronary artery disease,Pulmonary hypertension,COPD on oxygen via nasal canula,Artrial Afib,Essential tremors,Alcohol abuse among others.She was here for short term rehabilitation for post hospital admission at [facility] from 12/11/2019 - 12/17/2019 for fall after she was found on the floor by her daughter thought was on the floor x 2 days covered with feces.she was living alone.Had abrasion on both legs but did not recall how she fell.she was admitted for delirium.Her Aricept was stopped due to diarrhea but later restarted.Her WBC was 12.4 with mild tachycardia.Metoprolol was increased to 25 mg tablet twice daily.She did not tolerate addition of irbesartan and amlodipine back to her regimen due to Orthostatic hypotension.She had mild elevated troponin and CK.Her EKG showed no changes from previous.CT of the head was negative.she had an ultrasound done due to elevated liver function test had some biliary ductal dilatation.Had plans on outpatient for MRCP.LFT were stable during this admission.she was stabilized and was discharge here for rehab.she sustained a fall while here on 12/26/2019 after facility staff observed her sitting on the floor with her walker beside her.she complained of left hip /groin and sacral pain.X-ray of left hip/pelvis showed Osteoarthritis with healing of superior pubic ramus.Lumbar sacral X-ray showed no fracture.    She  has worked well with PT/OT now stable for discharge home.She will be discharged home with Home health PT/OT to continue with ROM, Exercise, Gait stability and muscle strengthening.She will also require a Home health Aide to assist with her activity of daily living.she continues to require x 1 assist.No new DME required has own walker.Home  health services will be arranged by facility social worker prior to discharge.Prescription medication will be written x 1 month then patient to follow up with PCP in 1-2 weeks.She denies any acute issues this visit.  Facility staff report no new concerns.   Past Medical History:  Diagnosis Date   Acid reflux    Asthma    Asthmatic COPD   Atrial fibrillation ([place]) 01/01/2016   Coronary artery disease    Echo 07/27/2011    Coronary artery stenosis    , High-grade left main   Dementia (HCC)    Essential tremor 03/16/2018   GERD (gastroesophageal reflux disease)    Hyperlipidemia    Hypertension    IBS (irritable bowel syndrome)    Psoriasis     Past Surgical History:  Procedure Laterality Date   CARDIAC SURGERY     CORONARY ARTERY BYPASS GRAFT  1994   After catheterization showed 90% ostial left main stenosis   DOPPLER ECHOCARDIOGRAPHY     Study showed mild to moderate MR with moderate TR, mild to moderate pulmonary hypertension with an ejection fraction greater that 55%.    EYE SURGERY     Nuclear Perfusion  10/2010, 11/15/2012   Showed normal perfusion, No Ischemia or Infarction, Normal EF   Renal Scan Duplex  04/09/2012   Showing greater than 50% diameter reduction in the celiac artery and SMA.. Right proximal 275 and Mid 152.   ROTATOR CUFF REPAIR     bilateral   Sleep Study  12/28/2006   AHI during total sleep time (3h 19 minutes) was 1.81/hr and during REM sleep at 17.78/hr. Mild sleep apnea during REM sleep.Oxygen staturated rate during REM and NREM was 93.0%.      reports that she quit smoking about 57 years ago. She has a 18.00 pack-year smoking history. She has never used smokeless tobacco. She reports that she does not drink alcohol and does not use drugs. Social History   Socioeconomic History   Marital status: Divorced    Spouse name: Not on file   Number of children: 1   Years of education: 12   Highest education level: Not on file  Occupational History   Occupation: retired    Comment: retired  Tobacco Use   Smoking status: Former Smoker    Packs/day: 1.00    Years: 18.00    Pack years: 18.00    Quit date: 11/08/1962    Years since  quitting: 57.3   Smokeless tobacco: Never Used  Substance and Sexual Activity   Alcohol use: No    Alcohol/week: 0.0 standard drinks    Comment: h/o heavy use - 2 bottles of wine daily   Drug use: No   Sexual activity: Never  Other Topics Concern   Not on file  Social History Narrative   Patient lives at home alone.    Patient is divorced.    Patient has one child.    Patient has a high school education.    Patient quit tobacco 40 years ago.    Patient drinks coffee and tea daily.    Social Determinants of Health   Financial Resource Strain:    Difficulty of Paying Living Expenses: Not on file  Food Insecurity:    Worried About Charity fundraiser in the Last Year: Not on file   [employer] of Food in the Last Year: Not on file  Transportation Needs:    Lack of Transportation (Medical): Not on file   Lack of Transportation (  Non-Medical): Not on file  Physical Activity:    Days of Exercise per Week: Not on file   Minutes of Exercise per Session: Not on file  Stress:    Feeling of Stress : Not on file  Social Connections:    Frequency of Communication with Friends and Family: Not on file   Frequency of Social Gatherings with Friends and Family: Not on file   Attends Religious Services: Not on file   Active Member of Clubs or Organizations: Not on file   Attends Archivist Meetings: Not on file   Marital Status: Not on file  Intimate Partner Violence:    Fear of Current or Ex-Partner: Not on file   Emotionally Abused: Not on file   Physically Abused: Not on file   Sexually Abused: Not on file    Allergies  Allergen Reactions   Penicillins Hives    Has patient had a PCN reaction causing immediate rash, facial/tongue/throat swelling, SOB or lightheadedness with hypotension: No Has patient had a PCN reaction causing severe rash involving mucus membranes or skin necrosis: Yes Has patient had a PCN reaction that required hospitalization  No Has patient had a PCN reaction occurring within the last 10 years: Yes took again last year 2016 If all of the above answers are "NO", then may proceed with Cephalosporin use.    Naprosyn [Naproxen] Nausea Only    diarrhea   Sertraline Diarrhea   Lactose Intolerance (Gi) Other (See Comments)    Unknown- [facility] documented    Pertinent  Health Maintenance Due  Topic Date Due   DEXA SCAN  Never done   PNA vac Low Risk Adult (1 of 2 - PCV13) Never done   INFLUENZA VACCINE  12/16/2019    Medications: Outpatient Encounter Medications as of 02/11/2020  Medication Sig   acetaminophen (TYLENOL) 325 MG tablet Take 2 tablets (650 mg total) by mouth every 6 (six) hours as needed for mild pain or moderate pain.   acetaminophen (TYLENOL) 325 MG tablet Take 650 mg by mouth every 6 (six) hours. For Back Pain   albuterol (VENTOLIN HFA) 108 (90 Base) MCG/ACT inhaler Inhale 2 puffs into the lungs every 6 (six) hours as needed for wheezing (cough).   amLODipine (NORVASC) 2.5 MG tablet Take 1 tablet (2.5 mg total) by mouth at bedtime.   aspirin 81 MG tablet Take 81 mg by mouth daily.    atorvastatin (LIPITOR) 10 MG tablet Take 1 tablet (10 mg total) by mouth daily.   betamethasone dipropionate (DIPROLENE) 0.05 % ointment Apply 1 application topically daily. Apply to right lower leg.   donepezil (ARICEPT) 10 MG tablet Take 1 tablet (10 mg total) by mouth daily.   DULoxetine (CYMBALTA) 60 MG capsule Take 1 capsule (60 mg total) by mouth daily.   fluticasone furoate-vilanterol (BREO ELLIPTA) 200-25 MCG/INH AEPB Inhale 1 puff into the lungs daily. Rinse mouth out with water after using medication.   folic acid (FOLVITE) 1 MG tablet Take 1 tablet (1 mg total) by mouth daily.   furosemide (LASIX) 20 MG tablet Take 1 tablet (20 mg total) by mouth daily.   irbesartan (AVAPRO) 300 MG tablet Take 1 tablet (300 mg total) by mouth daily.   Lidocaine (SALONPAS PAIN  RELIEVING) 4 % PTCH Apply 1 patch topically in the morning.   metoprolol tartrate (LOPRESSOR) 25 MG tablet Take 1 tablet (25 mg total) by mouth 2 (two) times daily. Hold SBP<110, HR <60   pantoprazole (  PROTONIX) 40 MG tablet Take 1 tablet (40 mg total) by mouth 2 (two) times daily.   pentoxifylline (TRENTAL) 400 MG CR tablet Take 1 tablet (400 mg total) by mouth 2 (two) times daily.   potassium chloride (KLOR-CON) 20 MEQ packet Take 20 mEq by mouth daily.   [DISCONTINUED] albuterol (VENTOLIN HFA) 108 (90 Base) MCG/ACT inhaler Inhale 2 puffs into the lungs every 6 (six) hours as needed for wheezing (cough).   [DISCONTINUED] amLODipine (NORVASC) 2.5 MG tablet Take 2.5 mg by mouth at bedtime.   [DISCONTINUED] atorvastatin (LIPITOR) 10 MG tablet Take 10 mg by mouth daily.   [DISCONTINUED] betamethasone dipropionate (DIPROLENE) 0.05 % ointment Apply 1 application topically daily. Apply to right lower leg.   [DISCONTINUED] donepezil (ARICEPT) 10 MG tablet Take 1 tablet by mouth once daily   [DISCONTINUED] DULoxetine (CYMBALTA) 60 MG capsule Take 60 mg by mouth daily.    [DISCONTINUED] fluticasone furoate-vilanterol (BREO ELLIPTA) 200-25 MCG/INH AEPB Inhale 1 puff into the lungs daily. Rinse mouth out with water after using medication.   [DISCONTINUED] furosemide (LASIX) 20 MG tablet Take 20 mg by mouth daily.   [DISCONTINUED] irbesartan (AVAPRO) 300 MG tablet Take 300 mg by mouth daily.   [DISCONTINUED] metoprolol tartrate (LOPRESSOR) 25 MG tablet Take 1 tablet (25 mg total) by mouth 2 (two) times daily. Hold SBP<110, HR <60   [DISCONTINUED] pantoprazole (PROTONIX) 40 MG tablet Take 1 tablet (40 mg total) by mouth 2 (two) times daily.   [DISCONTINUED] pentoxifylline (TRENTAL) 400 MG CR tablet Take 400 mg by mouth 2 (two) times daily.   [DISCONTINUED] potassium chloride (KLOR-CON) 20 MEQ packet Take 20 mEq by mouth daily.   No facility-administered encounter medications on file as of  02/11/2020.     Review of Systems  Constitutional: Negative for appetite change, chills, fatigue and fever.  HENT: Positive for hearing loss. Negative for congestion, rhinorrhea, sinus pressure, sinus pain, sneezing and sore throat.   Eyes: Negative for pain, discharge, redness and itching.  Respiratory: Negative for cough, chest tightness, shortness of breath and wheezing.   Cardiovascular: Positive for leg swelling. Negative for chest pain and palpitations.  Gastrointestinal: Negative for abdominal distention, abdominal pain, constipation, diarrhea, nausea and vomiting.  Endocrine: Negative for cold intolerance, heat intolerance, polydipsia, polyphagia and polyuria.  Genitourinary: Negative for difficulty urinating, dysuria, flank pain, frequency and urgency.  Musculoskeletal: Positive for arthralgias and gait problem. Negative for joint swelling, myalgias and neck pain.  Skin: Negative for color change, pallor, rash and wound.  Neurological: Positive for tremors. Negative for dizziness, speech difficulty, weakness, light-headedness, numbness and headaches.  Hematological: Does not bruise/bleed easily.  Psychiatric/Behavioral: Negative for agitation, behavioral problems and sleep disturbance. The patient is not nervous/anxious.     Vitals:   02/11/20 1009  BP: 121/61  Pulse: 81  Resp: 18  Temp: 97.6 F (36.4 C)  Weight: 130 lb 3.2 oz (59.1 kg)  Height: 4\' 9"  (1.448 m)   Body mass index is 28.18 kg/m.  Physical Exam Vitals and nursing note reviewed.  Constitutional:      General: She is not in acute distress.    Appearance: She is overweight. She is not ill-appearing.  HENT:     Head: Normocephalic.     Ears:     Comments: HOH     Nose: Nose normal. No congestion or rhinorrhea.     Mouth/Throat:     Mouth: Mucous membranes are moist.     Pharynx: Oropharynx is clear. No oropharyngeal exudate  or posterior oropharyngeal erythema.     Comments: Severe missing teeth  Eyes:       General: No scleral icterus.       Right eye: No discharge.        Left eye: No discharge.     Extraocular Movements: Extraocular movements intact.     Conjunctiva/sclera: Conjunctivae normal.     Pupils: Pupils are equal, round, and reactive to light.  Neck:     Vascular: No carotid bruit.  Cardiovascular:     Rate and Rhythm: Normal rate and regular rhythm.     Pulses: Normal pulses.     Heart sounds: Normal heart sounds. No murmur heard.  No friction rub. No gallop.   Pulmonary:     Effort: Pulmonary effort is normal. No respiratory distress.     Breath sounds: Normal breath sounds. No wheezing, rhonchi or rales.  Chest:     Chest wall: No tenderness.  Abdominal:     General: Bowel sounds are normal. There is no distension.     Palpations: Abdomen is soft. There is no mass.     Tenderness: There is no abdominal tenderness. There is no right CVA tenderness, left CVA tenderness, guarding or rebound.  Musculoskeletal:        General: No swelling or tenderness.     Cervical back: Normal range of motion. No rigidity or tenderness.     Comments: Unsteady gait.Bilateral lower extremities trace edema  Lymphadenopathy:     Cervical: No cervical adenopathy.  Skin:    General: Skin is warm and dry.     Coloration: Skin is not pale.     Findings: No bruising, erythema, lesion or rash.  Neurological:     Mental Status: She is alert. Mental status is at baseline.     Cranial Nerves: No cranial nerve deficit.     Sensory: No sensory deficit.     Coordination: Coordination normal.     Gait: Gait abnormal.  Psychiatric:        Mood and Affect: Mood normal.        Speech: Speech normal.        Behavior: Behavior normal.        Thought Content: Thought content normal.        Cognition and Memory: Memory is impaired.    Labs reviewed: Basic Metabolic Panel: Recent Labs    12/11/19 1929 12/11/19 2238 12/12/19 0445 12/12/19 0445 12/13/19 0319 12/13/19 0319 12/14/19 0307  12/14/19 0307 12/15/19 0346 12/24/19 0000 02/08/20 0000  NA   < >  --  142   < > 139   < > 140   < > 141 143 143  K   < >  --  3.7   < > 3.8   < > 3.9   < > 3.8 4.0 4.2  CL   < >  --  112*   < > 111   < > 109   < > 106 111* 106  CO2   < >  --  20*   < > 21*   < > 23   < > 25 18 18   GLUCOSE   < >  --  111*   < > 112*  --  104*  --  102*  --   --   BUN   < >  --  25*   < > 20   < > 20   < > 20 12 14   CREATININE   < >  --  0.81   < > 0.71   < > 0.82   < > 0.78 0.8 0.8  CALCIUM   < >  --  8.2*   < > 8.5*   < > 8.4*   < > 9.1 8.8 9.5  MG  --  2.3 2.2  --   --   --   --   --   --   --   --   PHOS  --  3.3 2.8  --   --   --   --   --   --   --   --    < > = values in this interval not displayed.   Liver Function Tests: Recent Labs    11/06/19 2113 12/11/19 1929 12/12/19 0445  AST 655* 35 37  ALT 289* 34 30  ALKPHOS 134* 67 59  BILITOT 1.7* 1.0 0.7  PROT 6.5 6.5 5.9*  ALBUMIN 3.3* 3.4* 2.9*   Recent Labs    11/06/19 2113  LIPASE 35   Recent Labs    12/11/19 2238  AMMONIA 10   CBC: Recent Labs    12/11/19 1929 12/11/19 1929 12/12/19 0445 12/12/19 0445 12/13/19 0319 12/13/19 0319 12/14/19 0307 12/15/19 0346 12/24/19 0000  WBC 12.4*   < > 12.0*   < > 10.5   < > 9.3 10.6* 9.1  NEUTROABS 9.5*  --  9.4*  --   --   --   --   --   --   HGB 11.4*   < > 11.3*   < > 11.1*   < > 11.1* 11.4* 10.1*  HCT 36.7   < > 36.0   < > 34.7*   < > 34.9* 35.8* 31*  MCV 91.3   < > 92.1   < > 88.7  --  89.9 90.2  --   PLT 176   < > 176   < > 167   < > 188 205 297   < > = values in this interval not displayed.   Cardiac Enzymes: Recent Labs    12/11/19 1929 12/12/19 1701  CKTOTAL 310* 178    Procedures and Imaging Studies During Stay: CT PELVIS WO CONTRAST  Result Date: 02/06/2020 CLINICAL DATA:  Multiple falls. EXAM: CT PELVIS WITHOUT CONTRAST TECHNIQUE: Multidetector CT imaging of the pelvis was performed following the standard protocol without intravenous contrast. COMPARISON:   Plain films 12/11/2019 FINDINGS: Urinary Tract:  No abnormality visualized. Bowel: Sigmoid diverticulosis. No active diverticulitis. Normal appendix. Visualized pelvic small bowel unremarkable. Vascular/Lymphatic: Aortoiliac atherosclerosis. No aneurysm or adenopathy. Reproductive:  Uterus and adnexa unremarkable.  No mass. Other:  No free fluid or free air. Musculoskeletal: Fracture through the left pubic bone again noted. Some early callus formation, but fracture line remains evident with mild displacement. Hip joints are maintained. No proximal femoral fracture. Healing fracture also noted within the left sacrum. Fracture also seen within the right sacrum without evidence of healing, age indeterminate. IMPRESSION: Healing displaced left pubic fracture. Healing left sacral fracture with fracture lines remaining evident at both sites. Right sacral fracture noted without evidence of healing, age indeterminate. Sigmoid diverticulosis. Aortoiliac atherosclerosis. Electronically Signed   By: Rolm Baptise M.D.   On: 02/06/2020 09:25   Assessment/Plan: 1. Essential hypertension B/p stable. - continue on Amlodipine ,irbesartan and metoprolol  - amLODipine (NORVASC) 2.5 MG tablet; Take 1 tablet (2.5 mg total) by mouth at bedtime.  Dispense: 30 tablet; Refill: 0 - atorvastatin (LIPITOR) 10 MG  tablet; Take 1 tablet (10 mg total) by mouth daily.  Dispense: 30 tablet; Refill: 0 - irbesartan (AVAPRO) 300 MG tablet; Take 1 tablet (300 mg total) by mouth daily.  Dispense: 30 tablet; Refill: 0 - metoprolol tartrate (LOPRESSOR) 25 MG tablet; Take 1 tablet (25 mg total) by mouth 2 (two) times daily. Hold SBP<110, HR <60  Dispense: 30 tablet; Refill: 0 -  CBC, BMP in 1-2 weeks PCP   2. Coronary artery disease involving native coronary artery of native heart with angina pectoris (HCC) Chest pain free.continue on Atorvastatin,Amlodipine ,irbesartan and metoprolol  - amLODipine (NORVASC) 2.5 MG tablet; Take 1 tablet (2.5  mg total) by mouth at bedtime.  Dispense: 30 tablet; Refill: 0 - atorvastatin (LIPITOR) 10 MG tablet; Take 1 tablet (10 mg total) by mouth daily.  Dispense: 30 tablet; Refill: 0 - metoprolol tartrate (LOPRESSOR) 25 MG tablet; Take 1 tablet (25 mg total) by mouth 2 (two) times daily. Hold SBP<110, HR <60  Dispense: 30 tablet; Refill: 0  3. Pulmonary hypertension ([place]) - continue to metoprolol twice daily.  - metoprolol tartrate (LOPRESSOR) 25 MG tablet; Take 1 tablet (25 mg total) by mouth 2 (two) times daily. Hold SBP<110, HR <60  Dispense: 30 tablet; Refill: 0  4. Unsteady gait Has worked well with PT/ OT.She will discharge home PT/OT to continue with ROM, Exercise, Gait stability and muscle strengthening. No new DME required. Fall and safety precautions. -  CBC, BMP in 1-2 weeks PCP   5. Chronic diastolic CHF (congestive heart failure) (HCC) No signs of fluid overload.Has trace bilateral lower extremities edema. Continue on Furosemide and Metoprolol.on Potassium chloride supplement.  - furosemide (LASIX) 20 MG tablet; Take 1 tablet (20 mg total) by mouth daily.  Dispense: 30 tablet; Refill: 0 - metoprolol tartrate (LOPRESSOR) 25 MG tablet; Take 1 tablet (25 mg total) by mouth 2 (two) times daily. Hold SBP<110, HR <60  Dispense: 30 tablet; Refill: 0 - potassium chloride (KLOR-CON) 20 MEQ packet; Take 20 mEq by mouth daily.  Dispense: 30 packet; Refill: 0  6. Bilateral lower extremity edema No abrupt weight gain or symptoms of fluid overload. - Continue furosemide. - encouraged to keep legs elevated when seated.  - furosemide (LASIX) 20 MG tablet; Take 1 tablet (20 mg total) by mouth daily.  Dispense: 30 tablet; Refill: 0  7. Hyperlipidemia with target LDL less than 70 No LDL for review on chart.will defer to PCP - continue on Atorvastatin.  - atorvastatin (LIPITOR) 10 MG tablet; Take 1 tablet (10 mg total) by mouth daily.  Dispense: 30 tablet; Refill: 0  8. PAF (paroxysmal atrial  fibrillation) (HCC) HR controlled.continue on metoprolol  - metoprolol tartrate (LOPRESSOR) 25 MG tablet; Take 1 tablet (25 mg total) by mouth 2 (two) times daily. Hold SBP<110, HR <60  Dispense: 30 tablet; Refill: 0  9. Dementia without behavioral disturbance, unspecified dementia type ([place]) No new behavioral issues reported.continue on Aricept.  - Home health Nurse Aide to assist with her Activity of Living.  - donepezil (ARICEPT) 10 MG tablet; Take 1 tablet (10 mg total) by mouth daily.  Dispense: 30 tablet; Refill: 0  10. Left groin pain Pain has improved.continue on Tylenoll  11. Gastroesophageal reflux disease without esophagitis H/H stable. - continue on Protonix  - pantoprazole (PROTONIX) 40 MG tablet; Take 1 tablet (40 mg total) by mouth 2 (two) times daily.  Dispense: 60 tablet; Refill: 0  12. Recurrent falls Status post hospitalization for multiple falls.she remains high risk for  falls due to her poor safety awareness due to her memory impairment. - Home health PT/OT  - Home health Nurse Aide for ADL's - continue fall and safety precaution   13. Severe episode of recurrent major depressive disorder, without psychotic features ([place]) Mood stable. -continue on Duloxetine and monitor for mood changes.   - DULoxetine (CYMBALTA) 60 MG capsule; Take 1 capsule (60 mg total) by mouth daily.  Dispense: 30 capsule; Refill: 0  14. Moderate persistent asthma without complication Breathing stable. - continue on albuterol and Breo Ellipta. - albuterol (VENTOLIN HFA) 108 (90 Base) MCG/ACT inhaler; Inhale 2 puffs into the lungs every 6 (six) hours as needed for wheezing (cough).  Dispense: 18 g; Refill: 0 - fluticasone furoate-vilanterol (BREO ELLIPTA) 200-25 MCG/INH AEPB; Inhale 1 puff into the lungs daily. Rinse mouth out with water after using medication.  Dispense: 30 each; Refill: 0  Patient is being discharged with the following home health services:   -PT/OT for ROM, exercise,  gait stability and muscle strengthening - [facility] for ADL's assist   Patient is being discharged with the following durable medical equipment:   None has own walker.  Patient has been advised to f/u with their PCP in 1-2 weeks to for a transitions of care visit.Social services at their facility was responsible for arranging this appointment.  Pt was provided with adequate prescriptions of noncontrolled medications to reach the scheduled appointment.For controlled substances, a limited supply was provided as appropriate for the individual patient. If the pt normally receives these medications from a pain clinic or has a contract with another physician, these medications should be received from that clinic or physician only).    Future labs/tests needed:  CBC, BMP in 1-2 weeks PCP

## 2020-02-14 DIAGNOSIS — R32 Unspecified urinary incontinence: Secondary | ICD-10-CM | POA: Diagnosis not present

## 2020-02-14 DIAGNOSIS — I272 Pulmonary hypertension, unspecified: Secondary | ICD-10-CM | POA: Diagnosis not present

## 2020-02-14 DIAGNOSIS — I5032 Chronic diastolic (congestive) heart failure: Secondary | ICD-10-CM | POA: Diagnosis not present

## 2020-02-14 DIAGNOSIS — D649 Anemia, unspecified: Secondary | ICD-10-CM | POA: Diagnosis not present

## 2020-02-14 DIAGNOSIS — J449 Chronic obstructive pulmonary disease, unspecified: Secondary | ICD-10-CM | POA: Diagnosis not present

## 2020-02-14 DIAGNOSIS — Z9181 History of falling: Secondary | ICD-10-CM | POA: Diagnosis not present

## 2020-02-14 DIAGNOSIS — Z87891 Personal history of nicotine dependence: Secondary | ICD-10-CM | POA: Diagnosis not present

## 2020-02-14 DIAGNOSIS — G9341 Metabolic encephalopathy: Secondary | ICD-10-CM | POA: Diagnosis not present

## 2020-02-14 DIAGNOSIS — K219 Gastro-esophageal reflux disease without esophagitis: Secondary | ICD-10-CM | POA: Diagnosis not present

## 2020-02-14 DIAGNOSIS — G250 Essential tremor: Secondary | ICD-10-CM | POA: Diagnosis not present

## 2020-02-14 DIAGNOSIS — M25551 Pain in right hip: Secondary | ICD-10-CM | POA: Diagnosis not present

## 2020-02-14 DIAGNOSIS — I110 Hypertensive heart disease with heart failure: Secondary | ICD-10-CM | POA: Diagnosis not present

## 2020-02-14 DIAGNOSIS — Z79899 Other long term (current) drug therapy: Secondary | ICD-10-CM | POA: Diagnosis not present

## 2020-02-14 DIAGNOSIS — Z951 Presence of aortocoronary bypass graft: Secondary | ICD-10-CM | POA: Diagnosis not present

## 2020-02-14 DIAGNOSIS — F1010 Alcohol abuse, uncomplicated: Secondary | ICD-10-CM | POA: Diagnosis not present

## 2020-02-14 DIAGNOSIS — I2583 Coronary atherosclerosis due to lipid rich plaque: Secondary | ICD-10-CM | POA: Diagnosis not present

## 2020-02-14 DIAGNOSIS — K589 Irritable bowel syndrome without diarrhea: Secondary | ICD-10-CM | POA: Diagnosis not present

## 2020-02-14 DIAGNOSIS — F0390 Unspecified dementia without behavioral disturbance: Secondary | ICD-10-CM | POA: Diagnosis not present

## 2020-02-14 DIAGNOSIS — E440 Moderate protein-calorie malnutrition: Secondary | ICD-10-CM | POA: Diagnosis not present

## 2020-02-14 DIAGNOSIS — M1712 Unilateral primary osteoarthritis, left knee: Secondary | ICD-10-CM | POA: Diagnosis not present

## 2020-02-14 DIAGNOSIS — I739 Peripheral vascular disease, unspecified: Secondary | ICD-10-CM | POA: Diagnosis not present

## 2020-02-14 DIAGNOSIS — E785 Hyperlipidemia, unspecified: Secondary | ICD-10-CM | POA: Diagnosis not present

## 2020-02-14 DIAGNOSIS — Z7982 Long term (current) use of aspirin: Secondary | ICD-10-CM | POA: Diagnosis not present

## 2020-02-14 DIAGNOSIS — I480 Paroxysmal atrial fibrillation: Secondary | ICD-10-CM | POA: Diagnosis not present

## 2020-02-19 DIAGNOSIS — S32512D Fracture of superior rim of left pubis, subsequent encounter for fracture with routine healing: Secondary | ICD-10-CM | POA: Diagnosis not present

## 2020-02-19 DIAGNOSIS — M25552 Pain in left hip: Secondary | ICD-10-CM | POA: Diagnosis not present

## 2020-02-21 DIAGNOSIS — E785 Hyperlipidemia, unspecified: Secondary | ICD-10-CM | POA: Diagnosis not present

## 2020-02-21 DIAGNOSIS — K589 Irritable bowel syndrome without diarrhea: Secondary | ICD-10-CM | POA: Diagnosis not present

## 2020-02-21 DIAGNOSIS — Z951 Presence of aortocoronary bypass graft: Secondary | ICD-10-CM | POA: Diagnosis not present

## 2020-02-21 DIAGNOSIS — K219 Gastro-esophageal reflux disease without esophagitis: Secondary | ICD-10-CM | POA: Diagnosis not present

## 2020-02-21 DIAGNOSIS — G250 Essential tremor: Secondary | ICD-10-CM | POA: Diagnosis not present

## 2020-02-21 DIAGNOSIS — Z9181 History of falling: Secondary | ICD-10-CM | POA: Diagnosis not present

## 2020-02-21 DIAGNOSIS — Z87891 Personal history of nicotine dependence: Secondary | ICD-10-CM | POA: Diagnosis not present

## 2020-02-21 DIAGNOSIS — J449 Chronic obstructive pulmonary disease, unspecified: Secondary | ICD-10-CM | POA: Diagnosis not present

## 2020-02-21 DIAGNOSIS — I5032 Chronic diastolic (congestive) heart failure: Secondary | ICD-10-CM | POA: Diagnosis not present

## 2020-02-21 DIAGNOSIS — F0390 Unspecified dementia without behavioral disturbance: Secondary | ICD-10-CM | POA: Diagnosis not present

## 2020-02-21 DIAGNOSIS — D649 Anemia, unspecified: Secondary | ICD-10-CM | POA: Diagnosis not present

## 2020-02-21 DIAGNOSIS — I272 Pulmonary hypertension, unspecified: Secondary | ICD-10-CM | POA: Diagnosis not present

## 2020-02-21 DIAGNOSIS — M1712 Unilateral primary osteoarthritis, left knee: Secondary | ICD-10-CM | POA: Diagnosis not present

## 2020-02-21 DIAGNOSIS — Z79899 Other long term (current) drug therapy: Secondary | ICD-10-CM | POA: Diagnosis not present

## 2020-02-21 DIAGNOSIS — M25551 Pain in right hip: Secondary | ICD-10-CM | POA: Diagnosis not present

## 2020-02-21 DIAGNOSIS — I110 Hypertensive heart disease with heart failure: Secondary | ICD-10-CM | POA: Diagnosis not present

## 2020-02-21 DIAGNOSIS — I739 Peripheral vascular disease, unspecified: Secondary | ICD-10-CM | POA: Diagnosis not present

## 2020-02-21 DIAGNOSIS — R32 Unspecified urinary incontinence: Secondary | ICD-10-CM | POA: Diagnosis not present

## 2020-02-21 DIAGNOSIS — F1010 Alcohol abuse, uncomplicated: Secondary | ICD-10-CM | POA: Diagnosis not present

## 2020-02-21 DIAGNOSIS — Z7982 Long term (current) use of aspirin: Secondary | ICD-10-CM | POA: Diagnosis not present

## 2020-02-21 DIAGNOSIS — I2583 Coronary atherosclerosis due to lipid rich plaque: Secondary | ICD-10-CM | POA: Diagnosis not present

## 2020-02-21 DIAGNOSIS — E440 Moderate protein-calorie malnutrition: Secondary | ICD-10-CM | POA: Diagnosis not present

## 2020-02-21 DIAGNOSIS — G9341 Metabolic encephalopathy: Secondary | ICD-10-CM | POA: Diagnosis not present

## 2020-02-21 DIAGNOSIS — I480 Paroxysmal atrial fibrillation: Secondary | ICD-10-CM | POA: Diagnosis not present

## 2020-02-25 ENCOUNTER — Ambulatory Visit: Payer: PPO | Admitting: Physician Assistant

## 2020-02-25 DIAGNOSIS — R197 Diarrhea, unspecified: Secondary | ICD-10-CM | POA: Diagnosis not present

## 2020-02-25 DIAGNOSIS — R2689 Other abnormalities of gait and mobility: Secondary | ICD-10-CM | POA: Diagnosis not present

## 2020-02-25 DIAGNOSIS — S329XXD Fracture of unspecified parts of lumbosacral spine and pelvis, subsequent encounter for fracture with routine healing: Secondary | ICD-10-CM | POA: Diagnosis not present

## 2020-02-26 DIAGNOSIS — R32 Unspecified urinary incontinence: Secondary | ICD-10-CM | POA: Diagnosis not present

## 2020-02-26 DIAGNOSIS — G9341 Metabolic encephalopathy: Secondary | ICD-10-CM | POA: Diagnosis not present

## 2020-02-26 DIAGNOSIS — F1010 Alcohol abuse, uncomplicated: Secondary | ICD-10-CM | POA: Diagnosis not present

## 2020-02-26 DIAGNOSIS — J449 Chronic obstructive pulmonary disease, unspecified: Secondary | ICD-10-CM | POA: Diagnosis not present

## 2020-02-26 DIAGNOSIS — Z951 Presence of aortocoronary bypass graft: Secondary | ICD-10-CM | POA: Diagnosis not present

## 2020-02-26 DIAGNOSIS — Z79899 Other long term (current) drug therapy: Secondary | ICD-10-CM | POA: Diagnosis not present

## 2020-02-26 DIAGNOSIS — Z87891 Personal history of nicotine dependence: Secondary | ICD-10-CM | POA: Diagnosis not present

## 2020-02-26 DIAGNOSIS — Z9181 History of falling: Secondary | ICD-10-CM | POA: Diagnosis not present

## 2020-02-26 DIAGNOSIS — I5032 Chronic diastolic (congestive) heart failure: Secondary | ICD-10-CM | POA: Diagnosis not present

## 2020-02-26 DIAGNOSIS — M1712 Unilateral primary osteoarthritis, left knee: Secondary | ICD-10-CM | POA: Diagnosis not present

## 2020-02-26 DIAGNOSIS — I2583 Coronary atherosclerosis due to lipid rich plaque: Secondary | ICD-10-CM | POA: Diagnosis not present

## 2020-02-26 DIAGNOSIS — F0390 Unspecified dementia without behavioral disturbance: Secondary | ICD-10-CM | POA: Diagnosis not present

## 2020-02-26 DIAGNOSIS — I272 Pulmonary hypertension, unspecified: Secondary | ICD-10-CM | POA: Diagnosis not present

## 2020-02-26 DIAGNOSIS — G250 Essential tremor: Secondary | ICD-10-CM | POA: Diagnosis not present

## 2020-02-26 DIAGNOSIS — E785 Hyperlipidemia, unspecified: Secondary | ICD-10-CM | POA: Diagnosis not present

## 2020-02-26 DIAGNOSIS — M25551 Pain in right hip: Secondary | ICD-10-CM | POA: Diagnosis not present

## 2020-02-26 DIAGNOSIS — I110 Hypertensive heart disease with heart failure: Secondary | ICD-10-CM | POA: Diagnosis not present

## 2020-02-26 DIAGNOSIS — I739 Peripheral vascular disease, unspecified: Secondary | ICD-10-CM | POA: Diagnosis not present

## 2020-02-26 DIAGNOSIS — I480 Paroxysmal atrial fibrillation: Secondary | ICD-10-CM | POA: Diagnosis not present

## 2020-02-26 DIAGNOSIS — K589 Irritable bowel syndrome without diarrhea: Secondary | ICD-10-CM | POA: Diagnosis not present

## 2020-02-26 DIAGNOSIS — E440 Moderate protein-calorie malnutrition: Secondary | ICD-10-CM | POA: Diagnosis not present

## 2020-02-26 DIAGNOSIS — K219 Gastro-esophageal reflux disease without esophagitis: Secondary | ICD-10-CM | POA: Diagnosis not present

## 2020-02-26 DIAGNOSIS — D649 Anemia, unspecified: Secondary | ICD-10-CM | POA: Diagnosis not present

## 2020-02-26 DIAGNOSIS — Z7982 Long term (current) use of aspirin: Secondary | ICD-10-CM | POA: Diagnosis not present

## 2020-02-28 DIAGNOSIS — G250 Essential tremor: Secondary | ICD-10-CM | POA: Diagnosis not present

## 2020-02-28 DIAGNOSIS — I272 Pulmonary hypertension, unspecified: Secondary | ICD-10-CM | POA: Diagnosis not present

## 2020-02-28 DIAGNOSIS — Z87891 Personal history of nicotine dependence: Secondary | ICD-10-CM | POA: Diagnosis not present

## 2020-02-28 DIAGNOSIS — F1010 Alcohol abuse, uncomplicated: Secondary | ICD-10-CM | POA: Diagnosis not present

## 2020-02-28 DIAGNOSIS — F0390 Unspecified dementia without behavioral disturbance: Secondary | ICD-10-CM | POA: Diagnosis not present

## 2020-02-28 DIAGNOSIS — I480 Paroxysmal atrial fibrillation: Secondary | ICD-10-CM | POA: Diagnosis not present

## 2020-02-28 DIAGNOSIS — R32 Unspecified urinary incontinence: Secondary | ICD-10-CM | POA: Diagnosis not present

## 2020-02-28 DIAGNOSIS — G9341 Metabolic encephalopathy: Secondary | ICD-10-CM | POA: Diagnosis not present

## 2020-02-28 DIAGNOSIS — M1712 Unilateral primary osteoarthritis, left knee: Secondary | ICD-10-CM | POA: Diagnosis not present

## 2020-02-28 DIAGNOSIS — I110 Hypertensive heart disease with heart failure: Secondary | ICD-10-CM | POA: Diagnosis not present

## 2020-02-28 DIAGNOSIS — I2583 Coronary atherosclerosis due to lipid rich plaque: Secondary | ICD-10-CM | POA: Diagnosis not present

## 2020-02-28 DIAGNOSIS — I739 Peripheral vascular disease, unspecified: Secondary | ICD-10-CM | POA: Diagnosis not present

## 2020-02-28 DIAGNOSIS — Z79899 Other long term (current) drug therapy: Secondary | ICD-10-CM | POA: Diagnosis not present

## 2020-02-28 DIAGNOSIS — J449 Chronic obstructive pulmonary disease, unspecified: Secondary | ICD-10-CM | POA: Diagnosis not present

## 2020-02-28 DIAGNOSIS — Z9181 History of falling: Secondary | ICD-10-CM | POA: Diagnosis not present

## 2020-02-28 DIAGNOSIS — K589 Irritable bowel syndrome without diarrhea: Secondary | ICD-10-CM | POA: Diagnosis not present

## 2020-02-28 DIAGNOSIS — I5032 Chronic diastolic (congestive) heart failure: Secondary | ICD-10-CM | POA: Diagnosis not present

## 2020-02-28 DIAGNOSIS — M25551 Pain in right hip: Secondary | ICD-10-CM | POA: Diagnosis not present

## 2020-02-28 DIAGNOSIS — Z951 Presence of aortocoronary bypass graft: Secondary | ICD-10-CM | POA: Diagnosis not present

## 2020-02-28 DIAGNOSIS — E785 Hyperlipidemia, unspecified: Secondary | ICD-10-CM | POA: Diagnosis not present

## 2020-02-28 DIAGNOSIS — K219 Gastro-esophageal reflux disease without esophagitis: Secondary | ICD-10-CM | POA: Diagnosis not present

## 2020-02-28 DIAGNOSIS — D649 Anemia, unspecified: Secondary | ICD-10-CM | POA: Diagnosis not present

## 2020-02-28 DIAGNOSIS — Z7982 Long term (current) use of aspirin: Secondary | ICD-10-CM | POA: Diagnosis not present

## 2020-02-28 DIAGNOSIS — E440 Moderate protein-calorie malnutrition: Secondary | ICD-10-CM | POA: Diagnosis not present

## 2020-03-07 ENCOUNTER — Other Ambulatory Visit: Payer: Self-pay | Admitting: Family

## 2020-03-07 DIAGNOSIS — Z7982 Long term (current) use of aspirin: Secondary | ICD-10-CM | POA: Diagnosis not present

## 2020-03-07 DIAGNOSIS — K589 Irritable bowel syndrome without diarrhea: Secondary | ICD-10-CM | POA: Diagnosis not present

## 2020-03-07 DIAGNOSIS — F0390 Unspecified dementia without behavioral disturbance: Secondary | ICD-10-CM | POA: Diagnosis not present

## 2020-03-07 DIAGNOSIS — I5032 Chronic diastolic (congestive) heart failure: Secondary | ICD-10-CM | POA: Diagnosis not present

## 2020-03-07 DIAGNOSIS — K219 Gastro-esophageal reflux disease without esophagitis: Secondary | ICD-10-CM | POA: Diagnosis not present

## 2020-03-07 DIAGNOSIS — I480 Paroxysmal atrial fibrillation: Secondary | ICD-10-CM | POA: Diagnosis not present

## 2020-03-07 DIAGNOSIS — I10 Essential (primary) hypertension: Secondary | ICD-10-CM

## 2020-03-07 DIAGNOSIS — E785 Hyperlipidemia, unspecified: Secondary | ICD-10-CM | POA: Diagnosis not present

## 2020-03-07 DIAGNOSIS — I110 Hypertensive heart disease with heart failure: Secondary | ICD-10-CM | POA: Diagnosis not present

## 2020-03-07 DIAGNOSIS — G250 Essential tremor: Secondary | ICD-10-CM | POA: Diagnosis not present

## 2020-03-07 DIAGNOSIS — R32 Unspecified urinary incontinence: Secondary | ICD-10-CM | POA: Diagnosis not present

## 2020-03-07 DIAGNOSIS — Z9181 History of falling: Secondary | ICD-10-CM | POA: Diagnosis not present

## 2020-03-07 DIAGNOSIS — Z79899 Other long term (current) drug therapy: Secondary | ICD-10-CM | POA: Diagnosis not present

## 2020-03-07 DIAGNOSIS — Z87891 Personal history of nicotine dependence: Secondary | ICD-10-CM | POA: Diagnosis not present

## 2020-03-07 DIAGNOSIS — Z951 Presence of aortocoronary bypass graft: Secondary | ICD-10-CM | POA: Diagnosis not present

## 2020-03-07 DIAGNOSIS — M1712 Unilateral primary osteoarthritis, left knee: Secondary | ICD-10-CM | POA: Diagnosis not present

## 2020-03-07 DIAGNOSIS — I2583 Coronary atherosclerosis due to lipid rich plaque: Secondary | ICD-10-CM | POA: Diagnosis not present

## 2020-03-07 DIAGNOSIS — R600 Localized edema: Secondary | ICD-10-CM

## 2020-03-07 DIAGNOSIS — F1010 Alcohol abuse, uncomplicated: Secondary | ICD-10-CM | POA: Diagnosis not present

## 2020-03-07 DIAGNOSIS — I739 Peripheral vascular disease, unspecified: Secondary | ICD-10-CM | POA: Diagnosis not present

## 2020-03-07 DIAGNOSIS — G9341 Metabolic encephalopathy: Secondary | ICD-10-CM | POA: Diagnosis not present

## 2020-03-07 DIAGNOSIS — D649 Anemia, unspecified: Secondary | ICD-10-CM | POA: Diagnosis not present

## 2020-03-07 DIAGNOSIS — E440 Moderate protein-calorie malnutrition: Secondary | ICD-10-CM | POA: Diagnosis not present

## 2020-03-07 DIAGNOSIS — J449 Chronic obstructive pulmonary disease, unspecified: Secondary | ICD-10-CM | POA: Diagnosis not present

## 2020-03-07 DIAGNOSIS — I272 Pulmonary hypertension, unspecified: Secondary | ICD-10-CM | POA: Diagnosis not present

## 2020-03-07 DIAGNOSIS — F332 Major depressive disorder, recurrent severe without psychotic features: Secondary | ICD-10-CM

## 2020-03-07 DIAGNOSIS — M25551 Pain in right hip: Secondary | ICD-10-CM | POA: Diagnosis not present

## 2020-03-11 ENCOUNTER — Other Ambulatory Visit: Payer: Self-pay | Admitting: Cardiovascular Disease

## 2020-03-11 ENCOUNTER — Other Ambulatory Visit: Payer: Self-pay | Admitting: Allergy and Immunology

## 2020-03-11 DIAGNOSIS — I10 Essential (primary) hypertension: Secondary | ICD-10-CM

## 2020-03-11 DIAGNOSIS — I480 Paroxysmal atrial fibrillation: Secondary | ICD-10-CM

## 2020-03-11 DIAGNOSIS — K219 Gastro-esophageal reflux disease without esophagitis: Secondary | ICD-10-CM

## 2020-03-11 DIAGNOSIS — I5032 Chronic diastolic (congestive) heart failure: Secondary | ICD-10-CM

## 2020-03-11 DIAGNOSIS — I25119 Atherosclerotic heart disease of native coronary artery with unspecified angina pectoris: Secondary | ICD-10-CM

## 2020-03-11 DIAGNOSIS — I272 Pulmonary hypertension, unspecified: Secondary | ICD-10-CM

## 2020-03-18 DIAGNOSIS — Z79899 Other long term (current) drug therapy: Secondary | ICD-10-CM | POA: Diagnosis not present

## 2020-03-18 DIAGNOSIS — G9341 Metabolic encephalopathy: Secondary | ICD-10-CM | POA: Diagnosis not present

## 2020-03-18 DIAGNOSIS — I739 Peripheral vascular disease, unspecified: Secondary | ICD-10-CM | POA: Diagnosis not present

## 2020-03-18 DIAGNOSIS — Z7982 Long term (current) use of aspirin: Secondary | ICD-10-CM | POA: Diagnosis not present

## 2020-03-18 DIAGNOSIS — F0390 Unspecified dementia without behavioral disturbance: Secondary | ICD-10-CM | POA: Diagnosis not present

## 2020-03-18 DIAGNOSIS — M25551 Pain in right hip: Secondary | ICD-10-CM | POA: Diagnosis not present

## 2020-03-18 DIAGNOSIS — I110 Hypertensive heart disease with heart failure: Secondary | ICD-10-CM | POA: Diagnosis not present

## 2020-03-18 DIAGNOSIS — R32 Unspecified urinary incontinence: Secondary | ICD-10-CM | POA: Diagnosis not present

## 2020-03-18 DIAGNOSIS — J449 Chronic obstructive pulmonary disease, unspecified: Secondary | ICD-10-CM | POA: Diagnosis not present

## 2020-03-18 DIAGNOSIS — K589 Irritable bowel syndrome without diarrhea: Secondary | ICD-10-CM | POA: Diagnosis not present

## 2020-03-18 DIAGNOSIS — Z951 Presence of aortocoronary bypass graft: Secondary | ICD-10-CM | POA: Diagnosis not present

## 2020-03-18 DIAGNOSIS — G250 Essential tremor: Secondary | ICD-10-CM | POA: Diagnosis not present

## 2020-03-18 DIAGNOSIS — M1712 Unilateral primary osteoarthritis, left knee: Secondary | ICD-10-CM | POA: Diagnosis not present

## 2020-03-18 DIAGNOSIS — E440 Moderate protein-calorie malnutrition: Secondary | ICD-10-CM | POA: Diagnosis not present

## 2020-03-18 DIAGNOSIS — I480 Paroxysmal atrial fibrillation: Secondary | ICD-10-CM | POA: Diagnosis not present

## 2020-03-18 DIAGNOSIS — Z87891 Personal history of nicotine dependence: Secondary | ICD-10-CM | POA: Diagnosis not present

## 2020-03-18 DIAGNOSIS — F1010 Alcohol abuse, uncomplicated: Secondary | ICD-10-CM | POA: Diagnosis not present

## 2020-03-18 DIAGNOSIS — I272 Pulmonary hypertension, unspecified: Secondary | ICD-10-CM | POA: Diagnosis not present

## 2020-03-18 DIAGNOSIS — E785 Hyperlipidemia, unspecified: Secondary | ICD-10-CM | POA: Diagnosis not present

## 2020-03-18 DIAGNOSIS — Z9181 History of falling: Secondary | ICD-10-CM | POA: Diagnosis not present

## 2020-03-18 DIAGNOSIS — D649 Anemia, unspecified: Secondary | ICD-10-CM | POA: Diagnosis not present

## 2020-03-18 DIAGNOSIS — I2583 Coronary atherosclerosis due to lipid rich plaque: Secondary | ICD-10-CM | POA: Diagnosis not present

## 2020-03-18 DIAGNOSIS — I5032 Chronic diastolic (congestive) heart failure: Secondary | ICD-10-CM | POA: Diagnosis not present

## 2020-03-18 DIAGNOSIS — K219 Gastro-esophageal reflux disease without esophagitis: Secondary | ICD-10-CM | POA: Diagnosis not present

## 2020-03-19 DIAGNOSIS — I110 Hypertensive heart disease with heart failure: Secondary | ICD-10-CM | POA: Diagnosis not present

## 2020-03-19 DIAGNOSIS — Z87891 Personal history of nicotine dependence: Secondary | ICD-10-CM | POA: Diagnosis not present

## 2020-03-19 DIAGNOSIS — R32 Unspecified urinary incontinence: Secondary | ICD-10-CM | POA: Diagnosis not present

## 2020-03-19 DIAGNOSIS — K219 Gastro-esophageal reflux disease without esophagitis: Secondary | ICD-10-CM | POA: Diagnosis not present

## 2020-03-19 DIAGNOSIS — Z79899 Other long term (current) drug therapy: Secondary | ICD-10-CM | POA: Diagnosis not present

## 2020-03-19 DIAGNOSIS — K589 Irritable bowel syndrome without diarrhea: Secondary | ICD-10-CM | POA: Diagnosis not present

## 2020-03-19 DIAGNOSIS — I272 Pulmonary hypertension, unspecified: Secondary | ICD-10-CM | POA: Diagnosis not present

## 2020-03-19 DIAGNOSIS — F1010 Alcohol abuse, uncomplicated: Secondary | ICD-10-CM | POA: Diagnosis not present

## 2020-03-19 DIAGNOSIS — I739 Peripheral vascular disease, unspecified: Secondary | ICD-10-CM | POA: Diagnosis not present

## 2020-03-19 DIAGNOSIS — G9341 Metabolic encephalopathy: Secondary | ICD-10-CM | POA: Diagnosis not present

## 2020-03-19 DIAGNOSIS — Z7982 Long term (current) use of aspirin: Secondary | ICD-10-CM | POA: Diagnosis not present

## 2020-03-19 DIAGNOSIS — J449 Chronic obstructive pulmonary disease, unspecified: Secondary | ICD-10-CM | POA: Diagnosis not present

## 2020-03-19 DIAGNOSIS — G250 Essential tremor: Secondary | ICD-10-CM | POA: Diagnosis not present

## 2020-03-19 DIAGNOSIS — M25551 Pain in right hip: Secondary | ICD-10-CM | POA: Diagnosis not present

## 2020-03-19 DIAGNOSIS — Z951 Presence of aortocoronary bypass graft: Secondary | ICD-10-CM | POA: Diagnosis not present

## 2020-03-19 DIAGNOSIS — Z9181 History of falling: Secondary | ICD-10-CM | POA: Diagnosis not present

## 2020-03-19 DIAGNOSIS — I2583 Coronary atherosclerosis due to lipid rich plaque: Secondary | ICD-10-CM | POA: Diagnosis not present

## 2020-03-19 DIAGNOSIS — E785 Hyperlipidemia, unspecified: Secondary | ICD-10-CM | POA: Diagnosis not present

## 2020-03-19 DIAGNOSIS — I5032 Chronic diastolic (congestive) heart failure: Secondary | ICD-10-CM | POA: Diagnosis not present

## 2020-03-19 DIAGNOSIS — E440 Moderate protein-calorie malnutrition: Secondary | ICD-10-CM | POA: Diagnosis not present

## 2020-03-19 DIAGNOSIS — F0390 Unspecified dementia without behavioral disturbance: Secondary | ICD-10-CM | POA: Diagnosis not present

## 2020-03-19 DIAGNOSIS — D649 Anemia, unspecified: Secondary | ICD-10-CM | POA: Diagnosis not present

## 2020-03-19 DIAGNOSIS — I480 Paroxysmal atrial fibrillation: Secondary | ICD-10-CM | POA: Diagnosis not present

## 2020-03-19 DIAGNOSIS — M1712 Unilateral primary osteoarthritis, left knee: Secondary | ICD-10-CM | POA: Diagnosis not present

## 2020-03-20 DIAGNOSIS — F1010 Alcohol abuse, uncomplicated: Secondary | ICD-10-CM | POA: Diagnosis not present

## 2020-03-20 DIAGNOSIS — I272 Pulmonary hypertension, unspecified: Secondary | ICD-10-CM | POA: Diagnosis not present

## 2020-03-20 DIAGNOSIS — I110 Hypertensive heart disease with heart failure: Secondary | ICD-10-CM | POA: Diagnosis not present

## 2020-03-20 DIAGNOSIS — Z9181 History of falling: Secondary | ICD-10-CM | POA: Diagnosis not present

## 2020-03-20 DIAGNOSIS — Z87891 Personal history of nicotine dependence: Secondary | ICD-10-CM | POA: Diagnosis not present

## 2020-03-20 DIAGNOSIS — Z79899 Other long term (current) drug therapy: Secondary | ICD-10-CM | POA: Diagnosis not present

## 2020-03-20 DIAGNOSIS — I480 Paroxysmal atrial fibrillation: Secondary | ICD-10-CM | POA: Diagnosis not present

## 2020-03-20 DIAGNOSIS — I2583 Coronary atherosclerosis due to lipid rich plaque: Secondary | ICD-10-CM | POA: Diagnosis not present

## 2020-03-20 DIAGNOSIS — K219 Gastro-esophageal reflux disease without esophagitis: Secondary | ICD-10-CM | POA: Diagnosis not present

## 2020-03-20 DIAGNOSIS — M1712 Unilateral primary osteoarthritis, left knee: Secondary | ICD-10-CM | POA: Diagnosis not present

## 2020-03-20 DIAGNOSIS — F0390 Unspecified dementia without behavioral disturbance: Secondary | ICD-10-CM | POA: Diagnosis not present

## 2020-03-20 DIAGNOSIS — K589 Irritable bowel syndrome without diarrhea: Secondary | ICD-10-CM | POA: Diagnosis not present

## 2020-03-20 DIAGNOSIS — E785 Hyperlipidemia, unspecified: Secondary | ICD-10-CM | POA: Diagnosis not present

## 2020-03-20 DIAGNOSIS — M25551 Pain in right hip: Secondary | ICD-10-CM | POA: Diagnosis not present

## 2020-03-20 DIAGNOSIS — J449 Chronic obstructive pulmonary disease, unspecified: Secondary | ICD-10-CM | POA: Diagnosis not present

## 2020-03-20 DIAGNOSIS — Z951 Presence of aortocoronary bypass graft: Secondary | ICD-10-CM | POA: Diagnosis not present

## 2020-03-20 DIAGNOSIS — Z7982 Long term (current) use of aspirin: Secondary | ICD-10-CM | POA: Diagnosis not present

## 2020-03-20 DIAGNOSIS — I739 Peripheral vascular disease, unspecified: Secondary | ICD-10-CM | POA: Diagnosis not present

## 2020-03-20 DIAGNOSIS — D649 Anemia, unspecified: Secondary | ICD-10-CM | POA: Diagnosis not present

## 2020-03-20 DIAGNOSIS — G9341 Metabolic encephalopathy: Secondary | ICD-10-CM | POA: Diagnosis not present

## 2020-03-20 DIAGNOSIS — G250 Essential tremor: Secondary | ICD-10-CM | POA: Diagnosis not present

## 2020-03-20 DIAGNOSIS — R32 Unspecified urinary incontinence: Secondary | ICD-10-CM | POA: Diagnosis not present

## 2020-03-20 DIAGNOSIS — I5032 Chronic diastolic (congestive) heart failure: Secondary | ICD-10-CM | POA: Diagnosis not present

## 2020-03-20 DIAGNOSIS — E440 Moderate protein-calorie malnutrition: Secondary | ICD-10-CM | POA: Diagnosis not present

## 2020-03-26 ENCOUNTER — Telehealth: Payer: Self-pay | Admitting: Allergy and Immunology

## 2020-03-26 DIAGNOSIS — K219 Gastro-esophageal reflux disease without esophagitis: Secondary | ICD-10-CM

## 2020-03-26 MED ORDER — PANTOPRAZOLE SODIUM 40 MG PO TBEC: 40.0000 mg | DELAYED_RELEASE_TABLET | Freq: Two times a day (BID) | ORAL | 0 refills | Status: DC

## 2020-03-26 NOTE — Telephone Encounter (Signed)
Called daughter again but her mailbox is full.

## 2020-03-26 NOTE — Telephone Encounter (Signed)
Prescription has been sent and I left message for daughter to call back.

## 2020-03-26 NOTE — Telephone Encounter (Signed)
Patient daughter called and made appointment for nov 23,2021 and needs a refill on her pantoprazole called into [facility] on [address]. Daughter number is [phone] or cell number [phone].

## 2020-03-28 NOTE — Telephone Encounter (Signed)
Called and spoke with the patient and advised of medication being sent in. Patient verbalized understanding.

## 2020-04-02 ENCOUNTER — Ambulatory Visit (INDEPENDENT_AMBULATORY_CARE_PROVIDER_SITE_OTHER): Payer: PPO | Admitting: Physician Assistant

## 2020-04-02 ENCOUNTER — Encounter: Payer: Self-pay | Admitting: Physician Assistant

## 2020-04-02 ENCOUNTER — Telehealth: Payer: Self-pay

## 2020-04-02 VITALS — BP 168/56 | HR 96 | Ht 62.0 in | Wt 114.0 lb

## 2020-04-02 DIAGNOSIS — I5032 Chronic diastolic (congestive) heart failure: Secondary | ICD-10-CM | POA: Diagnosis not present

## 2020-04-02 DIAGNOSIS — I480 Paroxysmal atrial fibrillation: Secondary | ICD-10-CM | POA: Diagnosis not present

## 2020-04-02 DIAGNOSIS — I25119 Atherosclerotic heart disease of native coronary artery with unspecified angina pectoris: Secondary | ICD-10-CM

## 2020-04-02 DIAGNOSIS — I25709 Atherosclerosis of coronary artery bypass graft(s), unspecified, with unspecified angina pectoris: Secondary | ICD-10-CM

## 2020-04-02 DIAGNOSIS — M79605 Pain in left leg: Secondary | ICD-10-CM

## 2020-04-02 DIAGNOSIS — E785 Hyperlipidemia, unspecified: Secondary | ICD-10-CM | POA: Diagnosis not present

## 2020-04-02 DIAGNOSIS — I10 Essential (primary) hypertension: Secondary | ICD-10-CM | POA: Diagnosis not present

## 2020-04-02 MED ORDER — POTASSIUM CHLORIDE 20 MEQ PO PACK: 20.0000 meq | PACK | Freq: Every day | ORAL | 11 refills | Status: DC

## 2020-04-02 MED ORDER — AMLODIPINE BESYLATE 2.5 MG PO TABS: 2.5000 mg | ORAL_TABLET | Freq: Every day | ORAL | 11 refills | Status: DC

## 2020-04-02 NOTE — Patient Instructions (Addendum)
Medication Instructions:  RESTART your amlodipine 2.5 mg once a day *If you need a refill on your cardiac medications before your next appointment, please call your pharmacy*   Lab Work: BMET in 1-2 weeks If you have labs (blood work) drawn today and your tests are completely normal, you will receive your results only by: [name] MyChart Message (if you have MyChart) OR . A paper copy in the mail If you have any lab test that is abnormal or we need to change your treatment, we will call you to review the results.   Testing/Procedures: Your physician has requested that you have an ankle brachial index (ABI). During this test an ultrasound and blood pressure cuff are used to evaluate the arteries that supply the arms and legs with blood. Allow thirty minutes for this exam. There are no restrictions or special instructions.   Follow-Up: At [facility], you and your health needs are our priority.  As part of our continuing mission to provide you with exceptional heart care, we have created designated Provider Care Teams.  These Care Teams include your primary Cardiologist (physician) and Advanced Practice Providers (APPs -  Physician Assistants and Nurse Practitioners) who all work together to provide you with the care you need, when you need it.  We recommend signing up for the patient portal called "MyChart".  Sign up information is provided on this After Visit Summary.  MyChart is used to connect with patients for Virtual Visits (Telemedicine).  Patients are able to view lab/test results, encounter notes, upcoming appointments, etc.  Non-urgent messages can be sent to your provider as well.   To learn more about what you can do with MyChart, go to [email or web address].    Your next appointment:   1-2  month(s)  The format for your next appointment:   In Person  Provider:   You may see Shelva Majestic, MD or one of the following Advanced Practice Providers on your designated Care Team:     Almyra Deforest, PA-C  Fabian Sharp, PA-C or   Roby Lofts, [place]    Other Instructions None

## 2020-04-02 NOTE — Progress Notes (Addendum)
Cardiology Office Note:    Date:  04/04/2020   ID:  Tammy, [name] 1931-03-22, MRN [id number]  PCP:  [name], MD  [facility] Cardiologist:  Shelva Majestic, MD  [place] Electrophysiologist:  None   Referring MD: [name], MD   Chief Complaint  Patient presents with  . Follow-up    seen for Dr. Claiborne Billings    History of Present Illness:    Tammy [name] is a 86 y.o. female with a hx of CAD s/p CABG 1994, atrial fibrillation, dementia, essential tremor, hypertension, hyperlipidemia, and psoriasis.  She was found to have high-grade left main lesion prior to bypass surgery in 1994.  Myoview obtained on 11/21/2014 showed EF 74%, normal wall motion with normal perfusion, no ischemia or infarction.  Echocardiogram obtained on 07/19/2019 showed EF 65 to 70%, RVSP 31.5 mmHg, mild MR.  More recently, patient was admitted in July 2021 with a fall after being found by the daughter.  It was felt the patient likely has been on the floor for 2 to 3 days before being found.  It was felt the patient likely had a severe dehydration, fall and resulting in rhabdomyolysis.  Urinalysis was negative.  Ammonia level 10.  CT of the head showed no acute changes.  It was unclear if patient had a syncopal episode versus mechanical fall.  Echocardiogram showed EF 60 to 65%, no regional wall motion abnormality.  Orthostatic vital signs was negative, systolic blood pressure dropped by only 9 mmHg, her home Norvasc was held given concern for orthostatic changes (?).  Metoprolol was continued.  Patient presents today for cardiology office visit.  She did mention 1 week onset of chest pain last week, however there is no EKG changes and her chest pain is reproducible with palpation. I suspect that this is likely musculoskeletal pain.  Daughter also mentions she has been having some left lower extremity pain with walking given prior to the fall.  We will obtain ABI.  Her blood pressure has been quite  elevated recently, I recommended restarting amlodipine.  For some reason, her potassium was also discontinued during the recent stay at [place].  We will restart her potassium.  She will need a basic metabolic panel in 1 to 2 weeks.  She will also need 1 to 24-month follow-up.  Of note, both the patient and her daughter quite displeased with her stay at [place].  She reportedly had another fall at [place] and eventually was diagnosed with hip fracture.  She has since been released from [place] and currently lives at home.    Past Medical History:  Diagnosis Date  . Acid reflux   . Asthma    Asthmatic COPD  . Atrial fibrillation ([place]) 01/01/2016  . Coronary artery disease    Echo 07/27/2011   . Coronary artery stenosis    , High-grade left main  . Dementia ([place])   . Essential tremor 03/16/2018  . GERD (gastroesophageal reflux disease)   . Hyperlipidemia   . Hypertension   . IBS (irritable bowel syndrome)   . Psoriasis     Past Surgical History:  Procedure Laterality Date  . CARDIAC SURGERY    . CORONARY ARTERY BYPASS GRAFT  1994   After catheterization showed 90% ostial left main stenosis  . DOPPLER ECHOCARDIOGRAPHY     Study showed mild to moderate MR with moderate TR, mild to moderate pulmonary hypertension with an ejection fraction greater that 55%.   [name]  EYE SURGERY    . Nuclear Perfusion  10/2010, 11/15/2012   Showed normal perfusion, No Ischemia or Infarction, Normal EF  . Renal Scan Duplex  04/09/2012   Showing greater than 50% diameter reduction in the celiac artery and SMA.. Right proximal 275 and Mid 152.  [name] ROTATOR CUFF REPAIR     bilateral  . Sleep Study  12/28/2006   AHI during total sleep time (3h 19 minutes) was 1.81/hr and during REM sleep at 17.78/hr. Mild sleep apnea during REM sleep.Oxygen staturated rate during REM and NREM was 93.0%.    Current Medications: Current Meds  Medication Sig  . acetaminophen (TYLENOL) 325 MG tablet Take 2 tablets (650  mg total) by mouth every 6 (six) hours as needed for mild pain or moderate pain.  [name] acetaminophen (TYLENOL) 325 MG tablet Take 650 mg by mouth every 6 (six) hours. For Back Pain  . albuterol (VENTOLIN HFA) 108 (90 Base) MCG/ACT inhaler Inhale 2 puffs into the lungs every 6 (six) hours as needed for wheezing (cough).  [name] amLODipine (NORVASC) 2.5 MG tablet Take 1 tablet (2.5 mg total) by mouth at bedtime.  [name] aspirin 81 MG tablet Take 81 mg by mouth daily.   [name] atorvastatin (LIPITOR) 10 MG tablet Take 1 tablet (10 mg total) by mouth daily.  . betamethasone dipropionate (DIPROLENE) 0.05 % ointment Apply 1 application topically daily. Apply to right lower leg.  . donepezil (ARICEPT) 10 MG tablet Take 1 tablet (10 mg total) by mouth daily.  . DULoxetine (CYMBALTA) 60 MG capsule Take 1 capsule (60 mg total) by mouth daily.  . folic acid (FOLVITE) 1 MG tablet Take 1 tablet (1 mg total) by mouth daily.  . furosemide (LASIX) 20 MG tablet Take 1 tablet (20 mg total) by mouth daily.  . irbesartan (AVAPRO) 300 MG tablet Take 1 tablet (300 mg total) by mouth daily.  . metoprolol tartrate (LOPRESSOR) 25 MG tablet TAKE 1 TABLET BY MOUTH TWICE DAILY. HOLD FOR SYSTOLIC BLOOD JYNWGNFA<213 OR HR<60  . pantoprazole (PROTONIX) 40 MG tablet Take 1 tablet (40 mg total) by mouth 2 (two) times daily.  . pentoxifylline (TRENTAL) 400 MG CR tablet Take 1 tablet (400 mg total) by mouth 2 (two) times daily.  . potassium chloride (KLOR-CON) 20 MEQ packet Take 20 mEq by mouth daily.  . [DISCONTINUED] amLODipine (NORVASC) 2.5 MG tablet Take 1 tablet (2.5 mg total) by mouth at bedtime.  . [DISCONTINUED] amLODipine (NORVASC) 2.5 MG tablet Take 1 tablet (2.5 mg total) by mouth at bedtime.  . [DISCONTINUED] fluticasone furoate-vilanterol (BREO ELLIPTA) 200-25 MCG/INH AEPB Inhale 1 puff into the lungs daily. Rinse mouth out with water after using medication.  . [DISCONTINUED] Lidocaine (SALONPAS PAIN RELIEVING) 4 % PTCH Apply 1 patch  topically in the morning.  . [DISCONTINUED] potassium chloride (KLOR-CON) 20 MEQ packet Take 20 mEq by mouth daily.  . [DISCONTINUED] potassium chloride (KLOR-CON) 20 MEQ packet Take 20 mEq by mouth daily.     Allergies:   Penicillins, Naprosyn [naproxen], Sertraline, and Lactose intolerance (gi)   Social History   Socioeconomic History  . Marital status: Divorced    Spouse name: Not on file  . Number of children: 1  . Years of education: 69  . Highest education level: Not on file  Occupational History  . Occupation: retired    Comment: retired  Tobacco Use  . Smoking status: Former Smoker    Packs/day: 1.00    Years: 18.00    Pack years:  18.00    Quit date: 11/08/1962    Years since quitting: 57.4  . Smokeless tobacco: Never Used  Substance and Sexual Activity  . Alcohol use: No    Alcohol/week: 0.0 standard drinks    Comment: h/o heavy use - 2 bottles of wine daily  . Drug use: No  . Sexual activity: Never  Other Topics Concern  . Not on file  Social History Narrative   Patient lives at home alone.    Patient is divorced.    Patient has one child.    Patient has a high school education.    Patient quit tobacco 40 years ago.    Patient drinks coffee and tea daily.    Social Determinants of Health   Financial Resource Strain:   . Difficulty of Paying Living Expenses: Not on file  Food Insecurity:   . Worried About Charity fundraiser in the Last Year: Not on file  . Ran Out of Food in the Last Year: Not on file  Transportation Needs:   . Lack of Transportation (Medical): Not on file  . Lack of Transportation (Non-Medical): Not on file  Physical Activity:   . Days of Exercise per Week: Not on file  . Minutes of Exercise per Session: Not on file  Stress:   . Feeling of Stress : Not on file  Social Connections:   . Frequency of Communication with Friends and Family: Not on file  . Frequency of Social Gatherings with Friends and Family: Not on file  . Attends  Religious Services: Not on file  . Active Member of Clubs or Organizations: Not on file  . Attends Archivist Meetings: Not on file  . Marital Status: Not on file     Family History: The patient's family history includes Cancer in her brother; Colon cancer in her brother; Heart disease in her mother; Hypertension in her sister.  ROS:   Please see the history of present illness.     All other systems reviewed and are negative.  EKGs/Labs/Other Studies Reviewed:    The following studies were reviewed today:  Echo 12/12/2019 1. Small intracavitary gradient noted. Peak velocity 0.98 m/s. Peak  gradient 3.8 mmHg. Left ventricular ejection fraction, by estimation, is  60 to 65%. The left ventricle has normal function. The left ventricle has  no regional wall motion abnormalities.  There is mild concentric left ventricular hypertrophy. Left ventricular  diastolic parameters are consistent with Grade I diastolic dysfunction  (impaired relaxation). Elevated left ventricular end-diastolic pressure.  2. Right ventricular systolic function is normal. The right ventricular  size is normal. There is normal pulmonary artery systolic pressure.  3. The mitral valve is normal in structure. No evidence of mitral valve  regurgitation. No evidence of mitral stenosis.  4. The aortic valve is tricuspid. Aortic valve regurgitation is not  visualized. No aortic stenosis is present.  5. The inferior vena cava is normal in size with greater than 50%  respiratory variability, suggesting right atrial pressure of 3 mmHg.   EKG:  EKG is ordered today.  The ekg ordered today demonstrates normal sinus rhythm with occasional PVCs, nonspecific T wave changes.  Recent Labs: 12/11/2019: B Natriuretic Peptide 105.3 12/12/2019: ALT 30; Magnesium 2.2; TSH 0.473 12/24/2019: Hemoglobin 10.1; Platelets 297 02/08/2020: BUN 14; Creatinine 0.8; Potassium 4.2; Sodium 143  Recent Lipid Panel    Component Value  Date/Time   CHOL 118 01/03/2016 0304   CHOL 128 11/09/2012 0805   TRIG  106 01/03/2016 0304   TRIG 84 11/09/2012 0805   HDL 43 01/03/2016 0304   HDL 50 11/09/2012 0805   CHOLHDL 2.7 01/03/2016 0304   VLDL 21 01/03/2016 0304   LDLCALC 54 01/03/2016 0304   LDLCALC 61 11/09/2012 0805     Risk Assessment/Calculations:     CHA2DS2-VASc Score = 5  This indicates a 7.2% annual risk of stroke. The patient's score is based upon: CHF History: 0 HTN History: 1 Diabetes History: 0 Stroke History: 0 Vascular Disease History: 1 Age Score: 2 Gender Score: 1      Physical Exam:    VS:  BP (!) 168/56 (BP Location: Right Arm, Patient Position: Sitting, Cuff Size: Normal)   Pulse 96   Ht 5\' 2"  (1.575 m)   Wt 114 lb (51.7 kg)   BMI 20.85 kg/m     Wt Readings from Last 3 Encounters:  04/02/20 114 lb (51.7 kg)  02/11/20 130 lb 3.2 oz (59.1 kg)  01/31/20 130 lb 3.2 oz (59.1 kg)     GEN: Frail, sitting in wheelchair HEENT: Normal NECK: No JVD; No carotid bruits LYMPHATICS: No lymphadenopathy CARDIAC: RRR, no murmurs, rubs, gallops RESPIRATORY:  Clear to auscultation without rales, wheezing or rhonchi  ABDOMEN: Soft, non-tender, non-distended MUSCULOSKELETAL:  No edema; No deformity  SKIN: Warm and dry NEUROLOGIC:  Alert and oriented x 3 PSYCHIATRIC:  Normal affect   ASSESSMENT:    1. Left leg pain   2. Essential hypertension   3. Coronary artery disease involving coronary bypass graft of native heart with angina pectoris ([place])   4. Chronic diastolic CHF (congestive heart failure) ([place])   5. Hyperlipidemia with target LDL less than 70   6. PAF (paroxysmal atrial fibrillation) ([facility])    PLAN:    In order of problems listed above:  1. Left leg pain: She does have some bruising in her legs after the recent fall, however daughter mentions she has been having left leg pain given prior to the fall.  I will proceed with ABI to rule out arterial disease  2. CAD s/p CABG: Patient  mentioned chest pain last week.  She is unable to tell me more about the chest pain.  She has significant dementia.  She would not be a good candidate for any intervention.  On exam, her left chest is tender to palpation, her chest pain appears to be musculoskeletal in nature.  I recommended continue observation and reassess in 1 month  3. Hypertension: Blood pressure elevated today, will restart amlodipine.  4. Chronic diastolic heart failure: Appears to be euvolemic on exam  5. Hyperlipidemia: Continue Lipitor  6. PAF: She is not a candidate for anticoagulation therapy.  Continue metoprolol.  Heart rate appears to be regular on exam    Shared Decision Making/Informed Consent        Medication Adjustments/Labs and Tests Ordered: Current medicines are reviewed at length with the patient today.  Concerns regarding medicines are outlined above.  Orders Placed This Encounter  Procedures  . Basic metabolic panel  . VAS [place] ABI WITH/WO TBI   Meds ordered this encounter  Medications  . DISCONTD: amLODipine (NORVASC) 2.5 MG tablet    Sig: Take 1 tablet (2.5 mg total) by mouth at bedtime.    Dispense:  30 tablet    Refill:  11  . DISCONTD: potassium chloride (KLOR-CON) 20 MEQ packet    Sig: Take 20 mEq by mouth daily.    Dispense:  30 packet  Refill:  11  . amLODipine (NORVASC) 2.5 MG tablet    Sig: Take 1 tablet (2.5 mg total) by mouth at bedtime.    Dispense:  30 tablet    Refill:  11  . potassium chloride (KLOR-CON) 20 MEQ packet    Sig: Take 20 mEq by mouth daily.    Dispense:  30 packet    Refill:  11    Patient Instructions  Medication Instructions:  RESTART your amlodipine 2.5 mg once a day *If you need a refill on your cardiac medications before your next appointment, please call your pharmacy*   Lab Work: BMET in 1-2 weeks If you have labs (blood work) drawn today and your tests are completely normal, you will receive your results only by: [name] MyChart Message (if  you have MyChart) OR . A paper copy in the mail If you have any lab test that is abnormal or we need to change your treatment, we will call you to review the results.   Testing/Procedures: Your physician has requested that you have an ankle brachial index (ABI). During this test an ultrasound and blood pressure cuff are used to evaluate the arteries that supply the arms and legs with blood. Allow thirty minutes for this exam. There are no restrictions or special instructions.   Follow-Up: At [facility], you and your health needs are our priority.  As part of our continuing mission to provide you with exceptional heart care, we have created designated Provider Care Teams.  These Care Teams include your primary Cardiologist (physician) and Advanced Practice Providers (APPs -  Physician Assistants and Nurse Practitioners) who all work together to provide you with the care you need, when you need it.  We recommend signing up for the patient portal called "MyChart".  Sign up information is provided on this After Visit Summary.  MyChart is used to connect with patients for Virtual Visits (Telemedicine).  Patients are able to view lab/test results, encounter notes, upcoming appointments, etc.  Non-urgent messages can be sent to your provider as well.   To learn more about what you can do with MyChart, go to [email or web address].    Your next appointment:   1-2  month(s)  The format for your next appointment:   In Person  Provider:   You may see Shelva Majestic, MD or one of the following Advanced Practice Providers on your designated Care Team:    Almyra Deforest, PA-C  Fabian Sharp, PA-C or   Roby Lofts, PA-C    Other Instructions None     Signed, Almyra Deforest, [place]  04/04/2020 12:04 AM    [place]

## 2020-04-02 NOTE — Telephone Encounter (Signed)
Called to verify pt's daughter [name]'s work phone number since we will need to reach out to her to schedule pt's appointments during business hours tomorrow. Attempted using current number listed for daughter, but voicemail box was full. Left message on pt's home phone.

## 2020-04-03 ENCOUNTER — Encounter: Payer: Self-pay | Admitting: Physician Assistant

## 2020-04-03 ENCOUNTER — Telehealth: Payer: Self-pay | Admitting: Physician Assistant

## 2020-04-03 DIAGNOSIS — I2583 Coronary atherosclerosis due to lipid rich plaque: Secondary | ICD-10-CM | POA: Diagnosis not present

## 2020-04-03 DIAGNOSIS — I480 Paroxysmal atrial fibrillation: Secondary | ICD-10-CM | POA: Diagnosis not present

## 2020-04-03 DIAGNOSIS — M1712 Unilateral primary osteoarthritis, left knee: Secondary | ICD-10-CM | POA: Diagnosis not present

## 2020-04-03 DIAGNOSIS — K589 Irritable bowel syndrome without diarrhea: Secondary | ICD-10-CM | POA: Diagnosis not present

## 2020-04-03 DIAGNOSIS — Z951 Presence of aortocoronary bypass graft: Secondary | ICD-10-CM | POA: Diagnosis not present

## 2020-04-03 DIAGNOSIS — K219 Gastro-esophageal reflux disease without esophagitis: Secondary | ICD-10-CM | POA: Diagnosis not present

## 2020-04-03 DIAGNOSIS — R32 Unspecified urinary incontinence: Secondary | ICD-10-CM | POA: Diagnosis not present

## 2020-04-03 DIAGNOSIS — I272 Pulmonary hypertension, unspecified: Secondary | ICD-10-CM | POA: Diagnosis not present

## 2020-04-03 DIAGNOSIS — E440 Moderate protein-calorie malnutrition: Secondary | ICD-10-CM | POA: Diagnosis not present

## 2020-04-03 DIAGNOSIS — I739 Peripheral vascular disease, unspecified: Secondary | ICD-10-CM | POA: Diagnosis not present

## 2020-04-03 DIAGNOSIS — G9341 Metabolic encephalopathy: Secondary | ICD-10-CM | POA: Diagnosis not present

## 2020-04-03 DIAGNOSIS — M25551 Pain in right hip: Secondary | ICD-10-CM | POA: Diagnosis not present

## 2020-04-03 DIAGNOSIS — I110 Hypertensive heart disease with heart failure: Secondary | ICD-10-CM | POA: Diagnosis not present

## 2020-04-03 DIAGNOSIS — G250 Essential tremor: Secondary | ICD-10-CM | POA: Diagnosis not present

## 2020-04-03 DIAGNOSIS — Z7982 Long term (current) use of aspirin: Secondary | ICD-10-CM | POA: Diagnosis not present

## 2020-04-03 DIAGNOSIS — I5032 Chronic diastolic (congestive) heart failure: Secondary | ICD-10-CM | POA: Diagnosis not present

## 2020-04-03 DIAGNOSIS — F0390 Unspecified dementia without behavioral disturbance: Secondary | ICD-10-CM | POA: Diagnosis not present

## 2020-04-03 DIAGNOSIS — Z87891 Personal history of nicotine dependence: Secondary | ICD-10-CM | POA: Diagnosis not present

## 2020-04-03 DIAGNOSIS — Z79899 Other long term (current) drug therapy: Secondary | ICD-10-CM | POA: Diagnosis not present

## 2020-04-03 DIAGNOSIS — E785 Hyperlipidemia, unspecified: Secondary | ICD-10-CM | POA: Diagnosis not present

## 2020-04-03 DIAGNOSIS — F1010 Alcohol abuse, uncomplicated: Secondary | ICD-10-CM | POA: Diagnosis not present

## 2020-04-03 DIAGNOSIS — J449 Chronic obstructive pulmonary disease, unspecified: Secondary | ICD-10-CM | POA: Diagnosis not present

## 2020-04-03 DIAGNOSIS — Z9181 History of falling: Secondary | ICD-10-CM | POA: Diagnosis not present

## 2020-04-03 DIAGNOSIS — D649 Anemia, unspecified: Secondary | ICD-10-CM | POA: Diagnosis not present

## 2020-04-03 NOTE — Telephone Encounter (Signed)
Called to schedule the ABI that was ordered by Almyra Deforest at patient's visit on 04/02/20---Voice mail is full.  Will keep trying to reach patient.

## 2020-04-04 ENCOUNTER — Other Ambulatory Visit (HOSPITAL_COMMUNITY): Payer: Self-pay | Admitting: Physician Assistant

## 2020-04-04 DIAGNOSIS — M79605 Pain in left leg: Secondary | ICD-10-CM

## 2020-04-04 NOTE — Telephone Encounter (Signed)
Called to schedule 1-2 month follow up appointment----voice mail is full and could not leave message

## 2020-04-07 NOTE — Telephone Encounter (Signed)
Called to schedule 1-2 month follow up appointment with Dr. Claiborne Billings or an app---mail box is full

## 2020-04-08 ENCOUNTER — Other Ambulatory Visit: Payer: Self-pay

## 2020-04-08 ENCOUNTER — Ambulatory Visit (INDEPENDENT_AMBULATORY_CARE_PROVIDER_SITE_OTHER): Payer: PPO | Admitting: Allergy and Immunology

## 2020-04-08 ENCOUNTER — Encounter: Payer: Self-pay | Admitting: Allergy and Immunology

## 2020-04-08 VITALS — BP 144/60 | HR 93 | Resp 18

## 2020-04-08 DIAGNOSIS — K219 Gastro-esophageal reflux disease without esophagitis: Secondary | ICD-10-CM | POA: Diagnosis not present

## 2020-04-08 DIAGNOSIS — J4550 Severe persistent asthma, uncomplicated: Secondary | ICD-10-CM

## 2020-04-08 DIAGNOSIS — J3089 Other allergic rhinitis: Secondary | ICD-10-CM | POA: Diagnosis not present

## 2020-04-08 DIAGNOSIS — K029 Dental caries, unspecified: Secondary | ICD-10-CM

## 2020-04-08 MED ORDER — IPRATROPIUM BROMIDE 0.06 % NA SOLN: NASAL | 5 refills | Status: DC

## 2020-04-08 MED ORDER — AMOXICILLIN-POT CLAVULANATE 875-125 MG PO TABS: 1.0000 | ORAL_TABLET | Freq: Two times a day (BID) | ORAL | 0 refills | Status: AC

## 2020-04-08 NOTE — Progress Notes (Signed)
[place]   Follow-up Note  Referring Provider: [name], MD Primary Provider: [name], MD Date of Office Visit: 04/08/2020  Subjective:   Tammy [name] (DOB: 04-21-1931) is a 86 y.o. female who returns to the [facility] [place] on 04/08/2020 in re-evaluation of the following:  HPI: Tammy [name] presents to this clinic in evaluation of COPD with component of asthma, a history of exercise-induced hypoxemia, allergic rhinitis, and history of LPR.  Her last visit to this clinic was 20 March 2019.  She arrives today with her daughter.  Tammy [name] is very hard of hearing and most of the history obtained today was while screaming in her ear and speaking with her daughter.  Apparently she has been in and out of the hospital recently for falls.  She has apparently had 3 different bony fractures.  She apparently has been doing okay with her breathing as long she does not exert herself.  She is now using a walker so she really does not exercise to any significant degree.  It does not sound as though she is required a systemic steroid or an antibiotic for an airway issue in quite a long period in time.  She has been consistently using Breo and it sounds as though she may be using Flonase as well for her upper airway issue.  She believes that her reflux is under good control at this point in time.  She is using pantoprazole and she may also be using some famotidine.  She has apparently been having some problems with her lower extremity blood flow and she is going to have a studies to look for vascular obstruction.  Apparently she has been having systemic arterial hypertension and it was recommended that she go on some therapy for this issue and she is intermittently using amlodipine.  Over the course of the past week or so she has been developing bilateral ear pain and this migrates to the top of her head and she has had some nasal  congestion and some yellow nasal discharge.  She has not had any fever or chills or other respiratory tract symptoms.  She has received 3 Pfizer Covid vaccines.  Allergies as of 04/08/2020      Reactions   Penicillins Hives   Has patient had a PCN reaction causing immediate rash, facial/tongue/throat swelling, SOB or lightheadedness with hypotension: No Has patient had a PCN reaction causing severe rash involving mucus membranes or skin necrosis: Yes Has patient had a PCN reaction that required hospitalization No Has patient had a PCN reaction occurring within the last 10 years: Yes took again last year 2016 If all of the above answers are "NO", then may proceed with Cephalosporin use.   Naprosyn [naproxen] Nausea Only   diarrhea   Sertraline Diarrhea   Lactose Intolerance (gi) Other (See Comments)   Unknown- [facility] documented      Medication List      acetaminophen 325 MG tablet Commonly known as: TYLENOL Take 2 tablets (650 mg total) by mouth every 6 (six) hours as needed for mild pain or moderate pain.   acetaminophen 325 MG tablet Commonly known as: TYLENOL Take 650 mg by mouth every 6 (six) hours. For Back Pain   albuterol 108 (90 Base) MCG/ACT inhaler Commonly known as: VENTOLIN HFA Inhale 2 puffs into the lungs every 6 (six) hours as needed for wheezing (cough).   amLODipine 2.5 MG  tablet Commonly known as: NORVASC Take 1 tablet (2.5 mg total) by mouth at bedtime.   aspirin 81 MG tablet Take 81 mg by mouth daily.   atorvastatin 10 MG tablet Commonly known as: LIPITOR Take 1 tablet (10 mg total) by mouth daily.   betamethasone dipropionate 0.05 % ointment Commonly known as: DIPROLENE Apply 1 application topically daily. Apply to right lower leg.   donepezil 10 MG tablet Commonly known as: ARICEPT Take 1 tablet (10 mg total) by mouth daily.   DULoxetine 60 MG capsule Commonly known as: CYMBALTA Take 1 capsule (60 mg total) by mouth  daily.   folic acid 1 MG tablet Commonly known as: FOLVITE Take 1 tablet (1 mg total) by mouth daily.   furosemide 20 MG tablet Commonly known as: Lasix Take 1 tablet (20 mg total) by mouth daily.   irbesartan 300 MG tablet Commonly known as: AVAPRO Take 1 tablet (300 mg total) by mouth daily.   metoprolol tartrate 25 MG tablet Commonly known as: LOPRESSOR TAKE 1 TABLET BY MOUTH TWICE DAILY. HOLD FOR SYSTOLIC BLOOD HALPFXTK<240 OR HR<60   pantoprazole 40 MG tablet Commonly known as: Protonix Take 1 tablet (40 mg total) by mouth 2 (two) times daily.   pentoxifylline 400 MG CR tablet Commonly known as: TRENTAL Take 1 tablet (400 mg total) by mouth 2 (two) times daily.   potassium chloride 20 MEQ packet Commonly known as: KLOR-CON Take 20 mEq by mouth daily.       Past Medical History:  Diagnosis Date  . Acid reflux   . Asthma    Asthmatic COPD  . Atrial fibrillation ([place]) 01/01/2016  . Coronary artery disease    Echo 07/27/2011   . Coronary artery stenosis    , High-grade left main  . Dementia ([place])   . Essential tremor 03/16/2018  . GERD (gastroesophageal reflux disease)   . Hyperlipidemia   . Hypertension   . IBS (irritable bowel syndrome)   . Psoriasis     Past Surgical History:  Procedure Laterality Date  . CARDIAC SURGERY    . CORONARY ARTERY BYPASS GRAFT  1994   After catheterization showed 90% ostial left main stenosis  . DOPPLER ECHOCARDIOGRAPHY     Study showed mild to moderate MR with moderate TR, mild to moderate pulmonary hypertension with an ejection fraction greater that 55%.   [name] EYE SURGERY    . Nuclear Perfusion  10/2010, 11/15/2012   Showed normal perfusion, No Ischemia or Infarction, Normal EF  . Renal Scan Duplex  04/09/2012   Showing greater than 50% diameter reduction in the celiac artery and SMA.. Right proximal 275 and Mid 152.  [name] ROTATOR CUFF REPAIR     bilateral  . Sleep Study  12/28/2006   AHI during total sleep time (3h 19  minutes) was 1.81/hr and during REM sleep at 17.78/hr. Mild sleep apnea during REM sleep.Oxygen staturated rate during REM and NREM was 93.0%.    Review of systems negative except as noted in HPI / PMHx or noted below:  Review of Systems  Constitutional: Negative.   HENT: Negative.   Eyes: Negative.   Respiratory: Negative.   Cardiovascular: Negative.   Gastrointestinal: Negative.   Genitourinary: Negative.   Musculoskeletal: Negative.   Skin: Negative.   Neurological: Negative.   Endo/Heme/Allergies: Negative.   Psychiatric/Behavioral: Negative.      Objective:   Vitals:   04/08/20 1402  BP: (!) 144/60  Pulse: 93  Resp: 18  SpO2: 98%  Physical Exam Constitutional:      Appearance: She is not diaphoretic.  HENT:     Head: Normocephalic.     Right Ear: Tympanic membrane, ear canal and external ear normal.     Left Ear: Tympanic membrane, ear canal and external ear normal.     Nose: Nose normal. No mucosal edema or rhinorrhea.     Mouth/Throat:     Dentition: Dental caries (And widespread gingivitis) present.     Pharynx: Uvula midline. No oropharyngeal exudate.  Eyes:     Conjunctiva/sclera: Conjunctivae normal.  Neck:     Thyroid: No thyromegaly.     Trachea: Trachea normal. No tracheal tenderness or tracheal deviation.  Cardiovascular:     Rate and Rhythm: Normal rate and regular rhythm.     Heart sounds: Normal heart sounds, S1 normal and S2 normal. No murmur heard.   Pulmonary:     Effort: No respiratory distress.     Breath sounds: Normal breath sounds. No stridor. No wheezing or rales.  Lymphadenopathy:     Head:     Right side of head: No tonsillar adenopathy.     Left side of head: No tonsillar adenopathy.     Cervical: No cervical adenopathy.  Skin:    Findings: No erythema or rash.     Nails: There is no clubbing.  Neurological:     Mental Status: She is alert.     Diagnostics:    Spirometry was performed and demonstrated an FEV1  of 1.21 at 113 % of predicted.  Assessment and Plan:   1. Asthma, severe persistent, well-controlled   2. Perennial allergic rhinitis   3. LPRD (laryngopharyngeal reflux disease)   4. Dental caries     1. Use the following every day:   A.  Breo 200 - 1 inhalation 1 time per day  B.  Pantoprazole 40 mg one tablet two times per day  C.  Famotidine 40 mg one time per day - PM  D.  Flonase one spray each nostril 1 time per day  2. If Needed:   A. Ventolin HFA 2 puffs every 4-6 hours  B. Cetirizine 10 mg tablet 1 time per day  C. Ipratropium 0.06%.  1-2 sprays each nostril 3 times a day to dry  D. OTC Mucinex-1-2 tablets 1-2 times per day nose  3. For this recent episode, use the following:   A. Augmentin 875 - 1 tablet 2 times per day for 10 days  B. Prednisone 10 mg - 1 tablet 1 time per day for 5 days   4. Make a plan to address issues with teeth  5.  Further treatment?   6. Return to clinic in 6 months or earlier if problem  I am going to empirically treat Coreena with a broad-spectrum antibiotic and a low-dose of systemic steroids assuming that there may be a component of upper airway infection giving rise to her bilateral ear pain and headache and nasal congestion and yellow nasal discharge.  She needs to do something about her teeth.  She appears to have a low-grade infection of her teeth and they need to come out.  I did talk with her daughter today about helping arrange for that evaluation and treatment.  She will continue on therapy directed against respiratory tract inflammation and reflux as noted above.  I will see her back in this clinic in 6 months or earlier if there is a problem.  Allena Katz, MD Allergy / Immunology [facility]

## 2020-04-08 NOTE — Progress Notes (Signed)
o

## 2020-04-08 NOTE — Telephone Encounter (Signed)
Have been unable to reach patient for scheduling----will schedule appointment , mail information to patient and request she call with questions or concerns.

## 2020-04-08 NOTE — Patient Instructions (Addendum)
  1. Use the following every day:   A.  Breo 200 - 1 inhalation 1 time per day  B.  Pantoprazole 40 mg one tablet two times per day  C.  Famotidine 40 mg one time per day - PM  D.  Flonase one spray each nostril 1 time per day  2. If Needed:   A. Ventolin HFA 2 puffs every 4-6 hours  B. Cetirizine 10 mg tablet 1 time per day  C. Ipratropium 0.06%.  1-2 sprays each nostril 3 times a day to dry  D. OTC Mucinex-1-2 tablets 1-2 times per day nose  3. For this recent episode, use the following:   A. Augmentin 875 - 1 tablet 2 times per day for 10 days  B. Prednisone 10 mg - 1 tablet 1 time per day for 5 days   4. Make a plan to address issues with teeth  5.  Further treatment?   6. Return to clinic in 6 months or earlier if problem

## 2020-04-09 ENCOUNTER — Encounter: Payer: Self-pay | Admitting: Allergy and Immunology

## 2020-04-16 NOTE — Addendum Note (Signed)
Addended by: Jacqulynn Cadet on: 04/16/2020 02:19 PM   Modules accepted: Orders

## 2020-04-21 DIAGNOSIS — I5032 Chronic diastolic (congestive) heart failure: Secondary | ICD-10-CM | POA: Diagnosis not present

## 2020-04-21 DIAGNOSIS — I25119 Atherosclerotic heart disease of native coronary artery with unspecified angina pectoris: Secondary | ICD-10-CM | POA: Diagnosis not present

## 2020-04-21 DIAGNOSIS — I10 Essential (primary) hypertension: Secondary | ICD-10-CM | POA: Diagnosis not present

## 2020-04-22 LAB — BASIC METABOLIC PANEL
BUN/Creatinine Ratio: 10 — ABNORMAL LOW (ref 12–28)
BUN: 8 mg/dL (ref 8–27)
CO2: 22 mmol/L (ref 20–29)
Calcium: 8.9 mg/dL (ref 8.7–10.3)
Chloride: 104 mmol/L (ref 96–106)
Creatinine, Ser: 0.79 mg/dL (ref 0.57–1.00)
GFR calc Af Amer: 78 mL/min/{1.73_m2} (ref >59)
GFR calc non Af Amer: 68 mL/min/{1.73_m2} (ref >59)
Glucose: 108 mg/dL — ABNORMAL HIGH (ref 65–99)
Potassium: 3.5 mmol/L (ref 3.5–5.2)
Sodium: 141 mmol/L (ref 134–144)

## 2020-04-23 ENCOUNTER — Inpatient Hospital Stay (HOSPITAL_COMMUNITY): Admission: RE | Admit: 2020-04-23 | Payer: PPO | Source: Ambulatory Visit

## 2020-04-23 ENCOUNTER — Other Ambulatory Visit: Payer: Self-pay | Admitting: Allergy and Immunology

## 2020-04-23 DIAGNOSIS — K219 Gastro-esophageal reflux disease without esophagitis: Secondary | ICD-10-CM

## 2020-04-25 ENCOUNTER — Ambulatory Visit (HOSPITAL_COMMUNITY)
Admission: RE | Admit: 2020-04-25 | Discharge: 2020-04-25 | Disposition: A | Discharge status: Home / Self Care | Payer: PPO | Source: Ambulatory Visit | Attending: Cardiovascular Disease | Admitting: Cardiovascular Disease

## 2020-04-25 ENCOUNTER — Other Ambulatory Visit: Payer: Self-pay

## 2020-04-25 DIAGNOSIS — M79605 Pain in left leg: Secondary | ICD-10-CM | POA: Insufficient documentation

## 2020-04-28 ENCOUNTER — Telehealth: Payer: Self-pay

## 2020-04-28 ENCOUNTER — Other Ambulatory Visit: Payer: Self-pay | Admitting: Family

## 2020-04-28 DIAGNOSIS — F332 Major depressive disorder, recurrent severe without psychotic features: Secondary | ICD-10-CM

## 2020-04-28 NOTE — Telephone Encounter (Signed)
-----   Message from [place], [place] sent at 04/26/2020  4:19 PM EST ----- Mild arterial disease only without significant impact on blood flow, no need to treat

## 2020-05-14 ENCOUNTER — Ambulatory Visit: Payer: PPO | Admitting: Medical

## 2020-06-02 ENCOUNTER — Ambulatory Visit: Payer: PPO | Admitting: Neurology

## 2020-06-09 DIAGNOSIS — I8310 Varicose veins of unspecified lower extremity with inflammation: Secondary | ICD-10-CM | POA: Diagnosis not present

## 2020-06-09 DIAGNOSIS — E46 Unspecified protein-calorie malnutrition: Secondary | ICD-10-CM | POA: Diagnosis not present

## 2020-06-09 DIAGNOSIS — I10 Essential (primary) hypertension: Secondary | ICD-10-CM | POA: Diagnosis not present

## 2020-06-09 DIAGNOSIS — R609 Edema, unspecified: Secondary | ICD-10-CM | POA: Diagnosis not present

## 2020-06-09 DIAGNOSIS — F0390 Unspecified dementia without behavioral disturbance: Secondary | ICD-10-CM | POA: Diagnosis not present

## 2020-06-12 ENCOUNTER — Telehealth: Payer: Self-pay | Admitting: Neurology

## 2020-06-12 NOTE — Telephone Encounter (Signed)
I have been getting many requests for patients to change to see me because Dr. Jannifer Franklin is retiring. I think we need a meeting before I approve anymore, this needs to be a practice discussion based on when Dr. Jannifer Franklin is retiring and how we plan to transition patients. Please at this time have patients continue with Dr. Jannifer Franklin, I think he will be here until the end of the year as I understand it, I do not want to accept anymore before we have a plan. Thank you.

## 2020-06-12 NOTE — Telephone Encounter (Signed)
Pt.'s daughter & POA Jackelyn Poling is on [place]. She is requesting her mom switch providers to Dr. Jaynee Eagles as Dr. Jannifer Franklin will be retiring. Please advise.  Her contact is [phone] ext. [place]

## 2020-06-18 NOTE — Telephone Encounter (Signed)
Left VM for [name] [name].  Advised that it was recommended for patient to stay with Dr. Jannifer Franklin as he isn't retiring til end of year.  Please call the office to make an appointment as needed.

## 2020-07-04 DIAGNOSIS — J439 Emphysema, unspecified: Secondary | ICD-10-CM | POA: Diagnosis not present

## 2020-07-04 DIAGNOSIS — F339 Major depressive disorder, recurrent, unspecified: Secondary | ICD-10-CM | POA: Diagnosis not present

## 2020-07-04 DIAGNOSIS — I8393 Asymptomatic varicose veins of bilateral lower extremities: Secondary | ICD-10-CM | POA: Diagnosis not present

## 2020-07-04 DIAGNOSIS — E261 Secondary hyperaldosteronism: Secondary | ICD-10-CM | POA: Diagnosis not present

## 2020-07-04 DIAGNOSIS — I739 Peripheral vascular disease, unspecified: Secondary | ICD-10-CM | POA: Diagnosis not present

## 2020-07-04 DIAGNOSIS — D692 Other nonthrombocytopenic purpura: Secondary | ICD-10-CM | POA: Diagnosis not present

## 2020-07-04 DIAGNOSIS — F1021 Alcohol dependence, in remission: Secondary | ICD-10-CM | POA: Diagnosis not present

## 2020-07-04 DIAGNOSIS — I480 Paroxysmal atrial fibrillation: Secondary | ICD-10-CM | POA: Diagnosis not present

## 2020-07-04 DIAGNOSIS — I25118 Atherosclerotic heart disease of native coronary artery with other forms of angina pectoris: Secondary | ICD-10-CM | POA: Diagnosis not present

## 2020-07-04 DIAGNOSIS — I5032 Chronic diastolic (congestive) heart failure: Secondary | ICD-10-CM | POA: Diagnosis not present

## 2020-07-04 DIAGNOSIS — F0390 Unspecified dementia without behavioral disturbance: Secondary | ICD-10-CM | POA: Diagnosis not present

## 2020-07-04 DIAGNOSIS — J4550 Severe persistent asthma, uncomplicated: Secondary | ICD-10-CM | POA: Diagnosis not present

## 2020-08-05 ENCOUNTER — Other Ambulatory Visit: Payer: Self-pay | Admitting: Physician Assistant

## 2020-08-05 DIAGNOSIS — R634 Abnormal weight loss: Secondary | ICD-10-CM | POA: Diagnosis not present

## 2020-08-05 DIAGNOSIS — R1030 Lower abdominal pain, unspecified: Secondary | ICD-10-CM

## 2020-08-05 DIAGNOSIS — R197 Diarrhea, unspecified: Secondary | ICD-10-CM | POA: Diagnosis not present

## 2020-08-05 DIAGNOSIS — A09 Infectious gastroenteritis and colitis, unspecified: Secondary | ICD-10-CM | POA: Diagnosis not present

## 2020-08-18 ENCOUNTER — Other Ambulatory Visit: Payer: PPO

## 2020-08-25 ENCOUNTER — Ambulatory Visit
Admission: RE | Admit: 2020-08-25 | Discharge: 2020-08-25 | Disposition: A | Discharge status: Home / Self Care | Payer: PPO | Source: Ambulatory Visit | Attending: Physician Assistant | Admitting: Physician Assistant

## 2020-08-25 DIAGNOSIS — R634 Abnormal weight loss: Secondary | ICD-10-CM | POA: Diagnosis not present

## 2020-08-25 DIAGNOSIS — R1030 Lower abdominal pain, unspecified: Secondary | ICD-10-CM

## 2020-08-25 DIAGNOSIS — S3210XA Unspecified fracture of sacrum, initial encounter for closed fracture: Secondary | ICD-10-CM | POA: Diagnosis not present

## 2020-08-25 DIAGNOSIS — S32592A Other specified fracture of left pubis, initial encounter for closed fracture: Secondary | ICD-10-CM | POA: Diagnosis not present

## 2020-08-25 DIAGNOSIS — K8020 Calculus of gallbladder without cholecystitis without obstruction: Secondary | ICD-10-CM | POA: Diagnosis not present

## 2020-08-25 MED ORDER — IOPAMIDOL (ISOVUE-300) INJECTION 61%
100.0000 mL | Freq: Once | INTRAVENOUS | Status: AC | PRN
  Administered 2020-08-25: 100 mL via INTRAVENOUS

## 2020-09-15 DIAGNOSIS — R197 Diarrhea, unspecified: Secondary | ICD-10-CM | POA: Diagnosis not present

## 2020-09-16 ENCOUNTER — Other Ambulatory Visit: Payer: Self-pay | Admitting: Family

## 2020-09-16 DIAGNOSIS — J4540 Moderate persistent asthma, uncomplicated: Secondary | ICD-10-CM

## 2020-09-23 DIAGNOSIS — R197 Diarrhea, unspecified: Secondary | ICD-10-CM | POA: Diagnosis not present

## 2020-09-23 DIAGNOSIS — A0472 Enterocolitis due to Clostridium difficile, not specified as recurrent: Secondary | ICD-10-CM | POA: Diagnosis not present

## 2020-10-27 DIAGNOSIS — R197 Diarrhea, unspecified: Secondary | ICD-10-CM | POA: Diagnosis not present

## 2020-11-03 DIAGNOSIS — F324 Major depressive disorder, single episode, in partial remission: Secondary | ICD-10-CM | POA: Diagnosis not present

## 2020-11-03 DIAGNOSIS — R296 Repeated falls: Secondary | ICD-10-CM | POA: Diagnosis not present

## 2020-11-03 DIAGNOSIS — L400 Psoriasis vulgaris: Secondary | ICD-10-CM | POA: Diagnosis not present

## 2020-11-03 DIAGNOSIS — J449 Chronic obstructive pulmonary disease, unspecified: Secondary | ICD-10-CM | POA: Diagnosis not present

## 2020-11-03 DIAGNOSIS — E782 Mixed hyperlipidemia: Secondary | ICD-10-CM | POA: Diagnosis not present

## 2020-11-03 DIAGNOSIS — F0390 Unspecified dementia without behavioral disturbance: Secondary | ICD-10-CM | POA: Diagnosis not present

## 2020-11-03 DIAGNOSIS — I10 Essential (primary) hypertension: Secondary | ICD-10-CM | POA: Diagnosis not present

## 2020-11-03 DIAGNOSIS — F1021 Alcohol dependence, in remission: Secondary | ICD-10-CM | POA: Diagnosis not present

## 2020-11-03 DIAGNOSIS — M25512 Pain in left shoulder: Secondary | ICD-10-CM | POA: Diagnosis not present

## 2020-11-03 DIAGNOSIS — Z0000 Encounter for general adult medical examination without abnormal findings: Secondary | ICD-10-CM | POA: Diagnosis not present

## 2020-11-03 DIAGNOSIS — N183 Chronic kidney disease, stage 3 unspecified: Secondary | ICD-10-CM | POA: Diagnosis not present

## 2020-11-03 DIAGNOSIS — J309 Allergic rhinitis, unspecified: Secondary | ICD-10-CM | POA: Diagnosis not present

## 2020-12-25 ENCOUNTER — Other Ambulatory Visit: Payer: Self-pay | Admitting: Allergy and Immunology

## 2020-12-25 DIAGNOSIS — K219 Gastro-esophageal reflux disease without esophagitis: Secondary | ICD-10-CM

## 2021-04-28 DIAGNOSIS — D692 Other nonthrombocytopenic purpura: Secondary | ICD-10-CM | POA: Diagnosis not present

## 2021-04-28 DIAGNOSIS — F0390 Unspecified dementia without behavioral disturbance: Secondary | ICD-10-CM | POA: Diagnosis not present

## 2021-07-21 DIAGNOSIS — F0390 Unspecified dementia without behavioral disturbance: Secondary | ICD-10-CM | POA: Diagnosis not present

## 2021-07-21 DIAGNOSIS — D692 Other nonthrombocytopenic purpura: Secondary | ICD-10-CM | POA: Diagnosis not present

## 2021-07-21 DIAGNOSIS — I480 Paroxysmal atrial fibrillation: Secondary | ICD-10-CM | POA: Diagnosis not present

## 2021-07-21 DIAGNOSIS — N183 Chronic kidney disease, stage 3 unspecified: Secondary | ICD-10-CM | POA: Diagnosis not present

## 2021-08-16 DIAGNOSIS — L853 Xerosis cutis: Secondary | ICD-10-CM | POA: Diagnosis not present

## 2021-08-16 DIAGNOSIS — L739 Follicular disorder, unspecified: Secondary | ICD-10-CM | POA: Diagnosis not present

## 2021-09-15 DIAGNOSIS — F0390 Unspecified dementia without behavioral disturbance: Secondary | ICD-10-CM | POA: Diagnosis not present

## 2021-09-18 DIAGNOSIS — L989 Disorder of the skin and subcutaneous tissue, unspecified: Secondary | ICD-10-CM | POA: Diagnosis not present

## 2021-09-18 DIAGNOSIS — L03115 Cellulitis of right lower limb: Secondary | ICD-10-CM | POA: Diagnosis not present

## 2021-11-02 DIAGNOSIS — I10 Essential (primary) hypertension: Secondary | ICD-10-CM | POA: Diagnosis not present

## 2021-11-02 DIAGNOSIS — E782 Mixed hyperlipidemia: Secondary | ICD-10-CM | POA: Diagnosis not present

## 2021-11-09 DIAGNOSIS — J449 Chronic obstructive pulmonary disease, unspecified: Secondary | ICD-10-CM | POA: Diagnosis not present

## 2021-11-09 DIAGNOSIS — Z0000 Encounter for general adult medical examination without abnormal findings: Secondary | ICD-10-CM | POA: Diagnosis not present

## 2021-11-09 DIAGNOSIS — I5032 Chronic diastolic (congestive) heart failure: Secondary | ICD-10-CM | POA: Diagnosis not present

## 2021-11-09 DIAGNOSIS — I10 Essential (primary) hypertension: Secondary | ICD-10-CM | POA: Diagnosis not present

## 2021-11-09 DIAGNOSIS — I872 Venous insufficiency (chronic) (peripheral): Secondary | ICD-10-CM | POA: Diagnosis not present

## 2021-11-09 DIAGNOSIS — I272 Pulmonary hypertension, unspecified: Secondary | ICD-10-CM | POA: Diagnosis not present

## 2021-11-09 DIAGNOSIS — F1021 Alcohol dependence, in remission: Secondary | ICD-10-CM | POA: Diagnosis not present

## 2021-11-09 DIAGNOSIS — E782 Mixed hyperlipidemia: Secondary | ICD-10-CM | POA: Diagnosis not present

## 2021-11-09 DIAGNOSIS — I480 Paroxysmal atrial fibrillation: Secondary | ICD-10-CM | POA: Diagnosis not present

## 2021-11-09 DIAGNOSIS — F0390 Unspecified dementia without behavioral disturbance: Secondary | ICD-10-CM | POA: Diagnosis not present

## 2021-11-09 DIAGNOSIS — I700 Atherosclerosis of aorta: Secondary | ICD-10-CM | POA: Diagnosis not present

## 2021-11-09 DIAGNOSIS — F324 Major depressive disorder, single episode, in partial remission: Secondary | ICD-10-CM | POA: Diagnosis not present

## 2021-11-10 DIAGNOSIS — D485 Neoplasm of uncertain behavior of skin: Secondary | ICD-10-CM | POA: Diagnosis not present

## 2021-11-10 DIAGNOSIS — C44722 Squamous cell carcinoma of skin of right lower limb, including hip: Secondary | ICD-10-CM | POA: Diagnosis not present

## 2021-11-11 DIAGNOSIS — Z515 Encounter for palliative care: Secondary | ICD-10-CM | POA: Diagnosis not present

## 2021-11-11 DIAGNOSIS — I10 Essential (primary) hypertension: Secondary | ICD-10-CM | POA: Diagnosis not present

## 2021-11-11 DIAGNOSIS — F0390 Unspecified dementia without behavioral disturbance: Secondary | ICD-10-CM | POA: Diagnosis not present

## 2021-12-29 DIAGNOSIS — C44722 Squamous cell carcinoma of skin of right lower limb, including hip: Secondary | ICD-10-CM | POA: Diagnosis not present

## 2021-12-29 DIAGNOSIS — I872 Venous insufficiency (chronic) (peripheral): Secondary | ICD-10-CM | POA: Diagnosis not present

## 2022-01-21 DIAGNOSIS — Z48817 Encounter for surgical aftercare following surgery on the skin and subcutaneous tissue: Secondary | ICD-10-CM | POA: Diagnosis not present

## 2022-01-21 DIAGNOSIS — Z4801 Encounter for change or removal of surgical wound dressing: Secondary | ICD-10-CM | POA: Diagnosis not present

## 2022-01-29 DIAGNOSIS — L814 Other melanin hyperpigmentation: Secondary | ICD-10-CM | POA: Diagnosis not present

## 2022-01-29 DIAGNOSIS — L821 Other seborrheic keratosis: Secondary | ICD-10-CM | POA: Diagnosis not present

## 2022-01-29 DIAGNOSIS — I872 Venous insufficiency (chronic) (peripheral): Secondary | ICD-10-CM | POA: Diagnosis not present

## 2022-01-29 DIAGNOSIS — D1801 Hemangioma of skin and subcutaneous tissue: Secondary | ICD-10-CM | POA: Diagnosis not present

## 2022-02-10 DIAGNOSIS — J449 Chronic obstructive pulmonary disease, unspecified: Secondary | ICD-10-CM | POA: Diagnosis not present

## 2022-02-10 DIAGNOSIS — F3342 Major depressive disorder, recurrent, in full remission: Secondary | ICD-10-CM | POA: Diagnosis not present

## 2022-02-10 DIAGNOSIS — I10 Essential (primary) hypertension: Secondary | ICD-10-CM | POA: Diagnosis not present

## 2022-02-10 DIAGNOSIS — Z7951 Long term (current) use of inhaled steroids: Secondary | ICD-10-CM | POA: Diagnosis not present

## 2022-06-08 DIAGNOSIS — R21 Rash and other nonspecific skin eruption: Secondary | ICD-10-CM | POA: Diagnosis not present

## 2022-06-08 DIAGNOSIS — M542 Cervicalgia: Secondary | ICD-10-CM | POA: Diagnosis not present

## 2022-08-17 DIAGNOSIS — M542 Cervicalgia: Secondary | ICD-10-CM | POA: Diagnosis not present

## 2022-11-03 DIAGNOSIS — J449 Chronic obstructive pulmonary disease, unspecified: Secondary | ICD-10-CM | POA: Diagnosis not present

## 2022-11-03 DIAGNOSIS — E261 Secondary hyperaldosteronism: Secondary | ICD-10-CM | POA: Diagnosis not present

## 2022-11-03 DIAGNOSIS — Z7951 Long term (current) use of inhaled steroids: Secondary | ICD-10-CM | POA: Diagnosis not present

## 2022-11-03 DIAGNOSIS — Z515 Encounter for palliative care: Secondary | ICD-10-CM | POA: Diagnosis not present

## 2022-11-03 DIAGNOSIS — I5032 Chronic diastolic (congestive) heart failure: Secondary | ICD-10-CM | POA: Diagnosis not present

## 2022-11-26 DIAGNOSIS — E782 Mixed hyperlipidemia: Secondary | ICD-10-CM | POA: Diagnosis not present

## 2022-11-26 DIAGNOSIS — Z0000 Encounter for general adult medical examination without abnormal findings: Secondary | ICD-10-CM | POA: Diagnosis not present

## 2022-11-26 DIAGNOSIS — N1831 Chronic kidney disease, stage 3a: Secondary | ICD-10-CM | POA: Diagnosis not present

## 2022-11-26 DIAGNOSIS — F0390 Unspecified dementia without behavioral disturbance: Secondary | ICD-10-CM | POA: Diagnosis not present

## 2022-11-26 DIAGNOSIS — I5032 Chronic diastolic (congestive) heart failure: Secondary | ICD-10-CM | POA: Diagnosis not present

## 2022-11-26 DIAGNOSIS — D6869 Other thrombophilia: Secondary | ICD-10-CM | POA: Diagnosis not present

## 2022-11-26 DIAGNOSIS — M810 Age-related osteoporosis without current pathological fracture: Secondary | ICD-10-CM | POA: Diagnosis not present

## 2022-11-26 DIAGNOSIS — F324 Major depressive disorder, single episode, in partial remission: Secondary | ICD-10-CM | POA: Diagnosis not present

## 2022-11-26 DIAGNOSIS — J449 Chronic obstructive pulmonary disease, unspecified: Secondary | ICD-10-CM | POA: Diagnosis not present

## 2022-11-26 DIAGNOSIS — I130 Hypertensive heart and chronic kidney disease with heart failure and stage 1 through stage 4 chronic kidney disease, or unspecified chronic kidney disease: Secondary | ICD-10-CM | POA: Diagnosis not present

## 2022-11-26 DIAGNOSIS — Z6829 Body mass index (BMI) 29.0-29.9, adult: Secondary | ICD-10-CM | POA: Diagnosis not present

## 2022-11-26 DIAGNOSIS — I480 Paroxysmal atrial fibrillation: Secondary | ICD-10-CM | POA: Diagnosis not present

## 2022-11-26 DIAGNOSIS — I739 Peripheral vascular disease, unspecified: Secondary | ICD-10-CM | POA: Diagnosis not present

## 2022-11-26 DIAGNOSIS — I10 Essential (primary) hypertension: Secondary | ICD-10-CM | POA: Diagnosis not present

## 2022-12-01 DIAGNOSIS — M7918 Myalgia, other site: Secondary | ICD-10-CM | POA: Diagnosis not present

## 2022-12-01 DIAGNOSIS — M542 Cervicalgia: Secondary | ICD-10-CM | POA: Diagnosis not present

## 2023-03-04 DIAGNOSIS — N1831 Chronic kidney disease, stage 3a: Secondary | ICD-10-CM | POA: Diagnosis not present

## 2023-05-19 ENCOUNTER — Encounter (HOSPITAL_COMMUNITY): Payer: Self-pay | Admitting: Emergency Medicine

## 2023-05-19 ENCOUNTER — Inpatient Hospital Stay (HOSPITAL_COMMUNITY)
Admission: EM | Admit: 2023-05-19 | Discharge: 2023-05-25 | DRG: 871 | Disposition: A | Discharge status: Skilled Nursing Facility | Payer: PPO | Attending: Internal Medicine | Admitting: Internal Medicine

## 2023-05-19 ENCOUNTER — Other Ambulatory Visit: Payer: Self-pay

## 2023-05-19 ENCOUNTER — Emergency Department (HOSPITAL_COMMUNITY): Payer: PPO

## 2023-05-19 DIAGNOSIS — F0390 Unspecified dementia without behavioral disturbance: Secondary | ICD-10-CM | POA: Diagnosis present

## 2023-05-19 DIAGNOSIS — R410 Disorientation, unspecified: Secondary | ICD-10-CM | POA: Diagnosis present

## 2023-05-19 DIAGNOSIS — I5032 Chronic diastolic (congestive) heart failure: Secondary | ICD-10-CM | POA: Diagnosis not present

## 2023-05-19 DIAGNOSIS — Y92009 Unspecified place in unspecified non-institutional (private) residence as the place of occurrence of the external cause: Secondary | ICD-10-CM | POA: Diagnosis not present

## 2023-05-19 DIAGNOSIS — Z7982 Long term (current) use of aspirin: Secondary | ICD-10-CM | POA: Diagnosis not present

## 2023-05-19 DIAGNOSIS — N19 Unspecified kidney failure: Secondary | ICD-10-CM | POA: Diagnosis present

## 2023-05-19 DIAGNOSIS — I2510 Atherosclerotic heart disease of native coronary artery without angina pectoris: Secondary | ICD-10-CM | POA: Diagnosis not present

## 2023-05-19 DIAGNOSIS — N179 Acute kidney failure, unspecified: Secondary | ICD-10-CM | POA: Diagnosis present

## 2023-05-19 DIAGNOSIS — Z66 Do not resuscitate: Secondary | ICD-10-CM | POA: Diagnosis present

## 2023-05-19 DIAGNOSIS — Z87891 Personal history of nicotine dependence: Secondary | ICD-10-CM | POA: Diagnosis not present

## 2023-05-19 DIAGNOSIS — Z79899 Other long term (current) drug therapy: Secondary | ICD-10-CM

## 2023-05-19 DIAGNOSIS — Z955 Presence of coronary angioplasty implant and graft: Secondary | ICD-10-CM | POA: Diagnosis not present

## 2023-05-19 DIAGNOSIS — Y92008 Other place in unspecified non-institutional (private) residence as the place of occurrence of the external cause: Secondary | ICD-10-CM

## 2023-05-19 DIAGNOSIS — E785 Hyperlipidemia, unspecified: Secondary | ICD-10-CM | POA: Diagnosis present

## 2023-05-19 DIAGNOSIS — I272 Pulmonary hypertension, unspecified: Secondary | ICD-10-CM | POA: Diagnosis not present

## 2023-05-19 DIAGNOSIS — I10 Essential (primary) hypertension: Secondary | ICD-10-CM | POA: Diagnosis not present

## 2023-05-19 DIAGNOSIS — A419 Sepsis, unspecified organism: Secondary | ICD-10-CM | POA: Diagnosis not present

## 2023-05-19 DIAGNOSIS — Z800 Family history of malignant neoplasm of digestive organs: Secondary | ICD-10-CM | POA: Diagnosis not present

## 2023-05-19 DIAGNOSIS — J4520 Mild intermittent asthma, uncomplicated: Secondary | ICD-10-CM | POA: Diagnosis not present

## 2023-05-19 DIAGNOSIS — I110 Hypertensive heart disease with heart failure: Secondary | ICD-10-CM | POA: Diagnosis present

## 2023-05-19 DIAGNOSIS — Z8249 Family history of ischemic heart disease and other diseases of the circulatory system: Secondary | ICD-10-CM

## 2023-05-19 DIAGNOSIS — D649 Anemia, unspecified: Secondary | ICD-10-CM | POA: Diagnosis present

## 2023-05-19 DIAGNOSIS — K219 Gastro-esophageal reflux disease without esophagitis: Secondary | ICD-10-CM | POA: Diagnosis present

## 2023-05-19 DIAGNOSIS — R6520 Severe sepsis without septic shock: Secondary | ICD-10-CM | POA: Diagnosis present

## 2023-05-19 DIAGNOSIS — I340 Nonrheumatic mitral (valve) insufficiency: Secondary | ICD-10-CM | POA: Diagnosis not present

## 2023-05-19 DIAGNOSIS — B961 Klebsiella pneumoniae [K. pneumoniae] as the cause of diseases classified elsewhere: Secondary | ICD-10-CM | POA: Diagnosis present

## 2023-05-19 DIAGNOSIS — E860 Dehydration: Secondary | ICD-10-CM | POA: Diagnosis present

## 2023-05-19 DIAGNOSIS — J4489 Other specified chronic obstructive pulmonary disease: Secondary | ICD-10-CM | POA: Diagnosis present

## 2023-05-19 DIAGNOSIS — R296 Repeated falls: Secondary | ICD-10-CM | POA: Diagnosis not present

## 2023-05-19 DIAGNOSIS — E861 Hypovolemia: Secondary | ICD-10-CM | POA: Diagnosis present

## 2023-05-19 DIAGNOSIS — E782 Mixed hyperlipidemia: Secondary | ICD-10-CM | POA: Diagnosis not present

## 2023-05-19 DIAGNOSIS — R0602 Shortness of breath: Secondary | ICD-10-CM | POA: Diagnosis not present

## 2023-05-19 DIAGNOSIS — Z880 Allergy status to penicillin: Secondary | ICD-10-CM | POA: Diagnosis not present

## 2023-05-19 DIAGNOSIS — Z951 Presence of aortocoronary bypass graft: Secondary | ICD-10-CM | POA: Diagnosis not present

## 2023-05-19 DIAGNOSIS — W010XXA Fall on same level from slipping, tripping and stumbling without subsequent striking against object, initial encounter: Secondary | ICD-10-CM | POA: Diagnosis present

## 2023-05-19 DIAGNOSIS — I480 Paroxysmal atrial fibrillation: Secondary | ICD-10-CM | POA: Diagnosis present

## 2023-05-19 DIAGNOSIS — Z888 Allergy status to other drugs, medicaments and biological substances status: Secondary | ICD-10-CM

## 2023-05-19 DIAGNOSIS — W19XXXA Unspecified fall, initial encounter: Secondary | ICD-10-CM | POA: Diagnosis not present

## 2023-05-19 DIAGNOSIS — E538 Deficiency of other specified B group vitamins: Secondary | ICD-10-CM | POA: Diagnosis present

## 2023-05-19 DIAGNOSIS — G9341 Metabolic encephalopathy: Principal | ICD-10-CM | POA: Diagnosis present

## 2023-05-19 DIAGNOSIS — E739 Lactose intolerance, unspecified: Secondary | ICD-10-CM | POA: Diagnosis present

## 2023-05-19 DIAGNOSIS — N3000 Acute cystitis without hematuria: Secondary | ICD-10-CM | POA: Diagnosis not present

## 2023-05-19 LAB — COMPREHENSIVE METABOLIC PANEL
ALT: 12 U/L (ref 0–44)
AST: 20 U/L (ref 15–41)
Albumin: 3.8 g/dL (ref 3.5–5.0)
Alkaline Phosphatase: 69 U/L (ref 38–126)
Anion gap: 10 (ref 5–15)
BUN: 39 mg/dL — ABNORMAL HIGH (ref 8–23)
CO2: 21 mmol/L — ABNORMAL LOW (ref 22–32)
Calcium: 8.9 mg/dL (ref 8.9–10.3)
Chloride: 106 mmol/L (ref 98–111)
Creatinine, Ser: 1.76 mg/dL — ABNORMAL HIGH (ref 0.44–1.00)
GFR, Estimated: 27 mL/min — ABNORMAL LOW (ref >60)
Glucose, Bld: 122 mg/dL — ABNORMAL HIGH (ref 70–99)
Potassium: 4.7 mmol/L (ref 3.5–5.1)
Sodium: 137 mmol/L (ref 135–145)
Total Bilirubin: 1 mg/dL (ref 0.0–1.2)
Total Protein: 7.8 g/dL (ref 6.5–8.1)

## 2023-05-19 LAB — URINALYSIS, ROUTINE W REFLEX MICROSCOPIC
Bilirubin Urine: NEGATIVE
Glucose, UA: NEGATIVE mg/dL
Hgb urine dipstick: NEGATIVE
Ketones, ur: NEGATIVE mg/dL
Nitrite: NEGATIVE
Protein, ur: 100 mg/dL — AB
Specific Gravity, Urine: 1.012 (ref 1.005–1.030)
WBC, UA: >50 WBC/hpf (ref 0–5)
pH: 5 (ref 5.0–8.0)

## 2023-05-19 LAB — CBC WITH DIFFERENTIAL/PLATELET
Abs Immature Granulocytes: 0.07 10*3/uL (ref 0.00–0.07)
Basophils Absolute: 0 10*3/uL (ref 0.0–0.1)
Basophils Relative: 0 %
Eosinophils Absolute: 0.1 10*3/uL (ref 0.0–0.5)
Eosinophils Relative: 0 %
HCT: 35.8 % — ABNORMAL LOW (ref 36.0–46.0)
Hemoglobin: 11.9 g/dL — ABNORMAL LOW (ref 12.0–15.0)
Immature Granulocytes: 1 %
Lymphocytes Relative: 15 %
Lymphs Abs: 2.1 10*3/uL (ref 0.7–4.0)
MCH: 29.5 pg (ref 26.0–34.0)
MCHC: 33.2 g/dL (ref 30.0–36.0)
MCV: 88.8 fL (ref 80.0–100.0)
Monocytes Absolute: 1.6 10*3/uL — ABNORMAL HIGH (ref 0.1–1.0)
Monocytes Relative: 11 %
Neutro Abs: 10 10*3/uL — ABNORMAL HIGH (ref 1.7–7.7)
Neutrophils Relative %: 73 %
Platelets: 254 10*3/uL (ref 150–400)
RBC: 4.03 MIL/uL (ref 3.87–5.11)
RDW: 13.1 % (ref 11.5–15.5)
WBC: 13.9 10*3/uL — ABNORMAL HIGH (ref 4.0–10.5)
nRBC: 0 % (ref 0.0–0.2)

## 2023-05-19 LAB — RAPID URINE DRUG SCREEN, HOSP PERFORMED
Amphetamines: NOT DETECTED
Barbiturates: NOT DETECTED
Benzodiazepines: NOT DETECTED
Cocaine: NOT DETECTED
Opiates: NOT DETECTED
Tetrahydrocannabinol: NOT DETECTED

## 2023-05-19 LAB — CK: Total CK: 224 U/L (ref 38–234)

## 2023-05-19 LAB — BLOOD GAS, VENOUS
Acid-base deficit: 3.1 mmol/L — ABNORMAL HIGH (ref 0.0–2.0)
Bicarbonate: 23.2 mmol/L (ref 20.0–28.0)
O2 Saturation: 43.3 %
Patient temperature: 37
pCO2, Ven: 45 mm[Hg] (ref 44–60)
pH, Ven: 7.32 (ref 7.25–7.43)
pO2, Ven: <31 mm[Hg] — CL (ref 32–45)

## 2023-05-19 LAB — MAGNESIUM: Magnesium: 2.3 mg/dL (ref 1.7–2.4)

## 2023-05-19 LAB — CBG MONITORING, ED: Glucose-Capillary: 107 mg/dL — ABNORMAL HIGH (ref 70–99)

## 2023-05-19 LAB — ETHANOL: Alcohol, Ethyl (B): <10 mg/dL (ref <10)

## 2023-05-19 MED ORDER — SODIUM CHLORIDE 0.9 % IV SOLN: INTRAVENOUS | Status: AC

## 2023-05-19 MED ORDER — SODIUM CHLORIDE 0.9 % IV SOLN
1.0000 g | Freq: Once | INTRAVENOUS | Status: AC
  Administered 2023-05-19: 1 g via INTRAVENOUS
  Filled 2023-05-19: qty 10

## 2023-05-19 MED ORDER — ACETAMINOPHEN 325 MG PO TABS
650.0000 mg | ORAL_TABLET | Freq: Four times a day (QID) | ORAL | Status: DC | PRN
  Administered 2023-05-21 – 2023-05-24 (×2): 650 mg via ORAL
  Filled 2023-05-19 (×2): qty 2

## 2023-05-19 MED ORDER — SODIUM CHLORIDE 0.9 % IV BOLUS
1000.0000 mL | Freq: Once | INTRAVENOUS | Status: AC
  Administered 2023-05-19: 1000 mL via INTRAVENOUS

## 2023-05-19 MED ORDER — ACETAMINOPHEN 650 MG RE SUPP: 650.0000 mg | Freq: Four times a day (QID) | RECTAL | Status: DC | PRN

## 2023-05-19 MED ORDER — SODIUM CHLORIDE 0.9 % IV SOLN
1.0000 g | INTRAVENOUS | Status: DC
  Administered 2023-05-20 – 2023-05-22 (×3): 1 g via INTRAVENOUS
  Filled 2023-05-19 (×4): qty 10

## 2023-05-19 MED ORDER — METOPROLOL SUCCINATE ER 50 MG PO TB24
50.0000 mg | ORAL_TABLET | Freq: Every day | ORAL | Status: DC
  Administered 2023-05-20 – 2023-05-24 (×5): 50 mg via ORAL
  Filled 2023-05-19 (×6): qty 1

## 2023-05-19 MED ORDER — ONDANSETRON HCL 4 MG/2ML IJ SOLN: 4.0000 mg | Freq: Four times a day (QID) | INTRAMUSCULAR | Status: DC | PRN

## 2023-05-19 MED ORDER — SODIUM CHLORIDE 0.9 % IV BOLUS
1000.0000 mL | Freq: Once | INTRAVENOUS | Status: AC
Start: 2023-05-19 — End: 2023-05-19
  Administered 2023-05-19: 1000 mL via INTRAVENOUS

## 2023-05-19 MED ORDER — ALBUTEROL SULFATE (2.5 MG/3ML) 0.083% IN NEBU: 2.5000 mg | INHALATION_SOLUTION | RESPIRATORY_TRACT | Status: DC | PRN

## 2023-05-19 MED ORDER — ATORVASTATIN CALCIUM 10 MG PO TABS
10.0000 mg | ORAL_TABLET | Freq: Every day | ORAL | Status: DC
  Administered 2023-05-20 – 2023-05-25 (×6): 10 mg via ORAL
  Filled 2023-05-19 (×6): qty 1

## 2023-05-19 MED ORDER — MELATONIN 3 MG PO TABS
3.0000 mg | ORAL_TABLET | Freq: Every evening | ORAL | Status: DC | PRN
Start: 2023-05-19 — End: 2023-05-25
  Administered 2023-05-20 – 2023-05-24 (×5): 3 mg via ORAL
  Filled 2023-05-19 (×5): qty 1

## 2023-05-19 NOTE — ED Triage Notes (Signed)
 Pt arriving via [facility] from home. Pts daughter found her slumped over the couch. Daughter reports pt has had increased weakness and well as multiple falls. Pt has dementia. Normally ambulatory without assistive device in the home, recently pt has had decrease in mobility. Pts fingers are cyanotic.

## 2023-05-19 NOTE — ED Notes (Signed)
 Pt has significant redness/irritation on her bottom and vaginal area. Peri care performed. Mepilex dressing placed on sacral area. Barrier cream applied to groin.

## 2023-05-19 NOTE — ED Provider Notes (Signed)
 [place] EMERGENCY DEPARTMENT AT [facility] Provider Note   CSN: [id number] Arrival date & time: 05/19/23  1635     History  Chief Complaint  Patient presents with   Altered Mental Status   Weakness    Tammy [name] is a 89 y.o. female.  89 year old female history of dementia (alert and oriented x 2 at baseline), asthma/COPD, hypertension, atrial fibrillation not on anticoagulation, and hyperlipidemia who presents emergency department for altered mental status.  Patient last seen normal yesterday between 4 and 5 PM by her daughter.  Today went to visit her mother and she was slumped over couch.  Told her daughter that she fell but did not recall how.  When she attempted get her mother up she appeared to be in pain but could not tell specifically where.  Also appeared to be confused.  Brought into the emergency department for further evaluation.  Lives at home by herself.  Typically has a home health aide or her daughter coming to check on her for several hours a day but is able to care for herself otherwise.       Home Medications Prior to Admission medications   Medication Sig Start Date End Date Taking? Authorizing Provider  acetaminophen  (TYLENOL ) 325 MG tablet Take 2 tablets (650 mg total) by mouth every 6 (six) hours as needed for mild pain or moderate pain. 12/16/19  Yes Rai, Ripudeep K, MD  albuterol  (VENTOLIN  HFA) 108 (90 Base) MCG/ACT inhaler Inhale 2 puffs into the lungs every 6 (six) hours as needed for wheezing (cough). 02/11/20  Yes Ngetich, Dinah C, NP  aspirin  81 MG tablet Take 81 mg by mouth daily.    Yes [provider]  atorvastatin  (LIPITOR) 10 MG tablet Take 1 tablet (10 mg total) by mouth daily. 02/11/20  Yes Ngetich, Dinah C, NP  folic acid  (FOLVITE ) 1 MG tablet Take 1 tablet (1 mg total) by mouth daily. 01/06/16  Yes Rai, Ripudeep K, MD  furosemide  (LASIX ) 20 MG tablet Take 1 tablet (20 mg total) by mouth daily. 02/11/20  Yes Ngetich, Dinah C, NP   irbesartan  (AVAPRO ) 300 MG tablet Take 1 tablet (300 mg total) by mouth daily. 02/11/20  Yes Ngetich, Dinah C, NP  metoprolol  succinate (TOPROL -XL) 50 MG 24 hr tablet Take 1 tablet by mouth daily.   Yes [provider]  pantoprazole  (PROTONIX ) 40 MG tablet TAKE 1 TABLET(40 MG) BY MOUTH TWICE DAILY Patient not taking: Reported on 05/19/2023 04/23/20   Kozlow, Eric J, MD      Allergies    Penicillins, Naprosyn [naproxen], Sertraline, and Lactose intolerance (gi)    Review of Systems   Review of Systems  Physical Exam Updated Vital Signs BP (!) 134/105   Pulse 89   Temp 98.2 F (36.8 C) (Oral)   Resp 18   SpO2 90%  Physical Exam Vitals and nursing note reviewed.  Constitutional:      General: She is not in acute distress.    Appearance: Normal appearance. She is well-developed. She is not ill-appearing.     Comments: Alert and oriented to self only.  Is conversant and responding to questions and joking with staff  HENT:     Head: Normocephalic and atraumatic.     Right Ear: External ear normal.     Left Ear: External ear normal.     Nose: Nose normal.     Mouth/Throat:     Mouth: Mucous membranes are moist.  Pharynx: Oropharynx is clear.  Eyes:     Extraocular Movements: Extraocular movements intact.     Conjunctiva/sclera: Conjunctivae normal.     Pupils: Pupils are equal, round, and reactive to light.  Neck:     Comments: No C-spine midline tenderness to palpation Cardiovascular:     Rate and Rhythm: Normal rate and regular rhythm.     Pulses: Normal pulses.     Heart sounds: Normal heart sounds. No murmur heard. Pulmonary:     Effort: Pulmonary effort is normal. No respiratory distress.     Breath sounds: Normal breath sounds.  Abdominal:     General: Abdomen is flat. There is no distension.     Palpations: Abdomen is soft. There is no mass.     Tenderness: There is no abdominal tenderness. There is no guarding.  Musculoskeletal:        General: No  deformity. Normal range of motion.     Cervical back: Normal range of motion and neck supple. No rigidity or tenderness.     Right lower leg: No edema.     Left lower leg: No edema.     Comments: No tenderness to palpation of midline thoracic or lumbar spine.  No step-offs palpated.  No tenderness to palpation of chest wall.  No bruising noted.  No tenderness to palpation of bilateral clavicles.  No tenderness to palpation, bruising, or deformities noted of bilateral shoulders, elbows, wrists, hips, knees, or ankles.  Skin:    General: Skin is warm and dry.  Neurological:     General: No focal deficit present.     Mental Status: She is alert.     Cranial Nerves: No cranial nerve deficit.     Sensory: No sensory deficit.     Motor: No weakness.     Comments: Difficulty complying with exam due to difficulty with following instructions.  Cranial nerves II through XII intact.  No visual neglect.  No visual field cuts.  Full strength of bilateral upper and lower extremities.  Reports intact sensation to upper and lower extremities.  Psychiatric:        Mood and Affect: Mood normal.     ED Results / Procedures / Treatments   Labs (all labs ordered are listed, but only abnormal results are displayed) Labs Reviewed  COMPREHENSIVE METABOLIC PANEL - Abnormal; Notable for the following components:      Result Value   CO2 21 (*)    Glucose, Bld 122 (*)    BUN 39 (*)    Creatinine, Ser 1.76 (*)    GFR, Estimated 27 (*)    All other components within normal limits  CBC WITH DIFFERENTIAL/PLATELET - Abnormal; Notable for the following components:   WBC 13.9 (*)    Hemoglobin 11.9 (*)    HCT 35.8 (*)    Neutro Abs 10.0 (*)    Monocytes Absolute 1.6 (*)    All other components within normal limits  URINALYSIS, ROUTINE W REFLEX MICROSCOPIC - Abnormal; Notable for the following components:   APPearance TURBID (*)    Protein, ur 100 (*)    Leukocytes,Ua LARGE (*)    Bacteria, UA FEW (*)     All other components within normal limits  BLOOD GAS, VENOUS - Abnormal; Notable for the following components:   pO2, Ven <31 (*)    Acid-base deficit 3.1 (*)    All other components within normal limits  CBC WITH DIFFERENTIAL/PLATELET - Abnormal; Notable for the following components:  RBC 3.24 (*)    Hemoglobin 9.4 (*)    HCT 29.6 (*)    All other components within normal limits  COMPREHENSIVE METABOLIC PANEL - Abnormal; Notable for the following components:   Chloride 116 (*)    CO2 20 (*)    BUN 28 (*)    Creatinine, Ser 1.13 (*)    Calcium  7.8 (*)    Total Protein 5.7 (*)    Albumin 2.7 (*)    GFR, Estimated 46 (*)    All other components within normal limits  BRAIN NATRIURETIC PEPTIDE - Abnormal; Notable for the following components:   B Natriuretic Peptide 243.2 (*)    All other components within normal limits  CBG MONITORING, ED - Abnormal; Notable for the following components:   Glucose-Capillary 107 (*)    All other components within normal limits  CULTURE, OB URINE  RAPID URINE DRUG SCREEN, HOSP PERFORMED  ETHANOL  CK  MAGNESIUM  MAGNESIUM  SODIUM, URINE, RANDOM  CREATININE, URINE, RANDOM  PROCALCITONIN  I-STAT CG4 LACTIC ACID, ED  I-STAT CG4 LACTIC ACID, ED    EKG EKG Interpretation Date/Time:  Thursday May 19 2023 18:13:21 EST Ventricular Rate:  90 PR Interval:    QRS Duration:  83 QT Interval:  342 QTC Calculation: 419 R Axis:   56  Text Interpretation: Normal sinus rhythm Interpretation limited secondary to artifact Confirmed by Yolande Charleston [phone]) on 05/19/2023 9:05:06 PM  Radiology DG Forearm Left Result Date: 05/19/2023 CLINICAL DATA:  Recent fall with forearm pain, initial encounter EXAM: LEFT FOREARM - 2 VIEW COMPARISON:  03/26/2012 FINDINGS: Old healed fracture of the distal radial metaphysis is noted. Nonunion of a an ulnar styloid fracture is noted as well. No acute fracture or dislocation is noted. Degenerative changes are noted in  the carpal bones as well as the first [facility] joint. IMPRESSION: Healed fractures and degenerative change without acute abnormality. Electronically Signed   By: Oneil Devonshire M.D.   On: 05/19/2023 20:39   DG Pelvis Portable Result Date: 05/19/2023 CLINICAL DATA:  History of recent falls, initial encounter EXAM: PORTABLE PELVIS 1 VIEWS COMPARISON:  None Available. FINDINGS: Pelvic ring appears intact. Healed fracture is noted in the pubic symphysis on the left stable from prior CT of 2022. No acute fracture or dislocation is noted. No soft tissue abnormality is seen. IMPRESSION: No acute abnormality noted. Electronically Signed   By: Oneil Devonshire M.D.   On: 05/19/2023 20:32   DG Chest Port 1 View Result Date: 05/19/2023 CLINICAL DATA:  Weakness and history of recent falls, initial encounter EXAM: PORTABLE CHEST 1 VIEW COMPARISON:  12/11/2019 FINDINGS: Cardiac shadow is within normal limits. Postsurgical changes are noted. The lungs are well aerated bilaterally. No focal infiltrate or effusion is seen. No bony abnormality is noted. IMPRESSION: No acute abnormality seen. Electronically Signed   By: Oneil Devonshire M.D.   On: 05/19/2023 20:30   CT HEAD WO CONTRAST Result Date: 05/19/2023 CLINICAL DATA:  Altered mental status.  Fall.  History of dementia. EXAM: CT HEAD WITHOUT CONTRAST CT CERVICAL SPINE WITHOUT CONTRAST TECHNIQUE: Multidetector CT imaging of the head and cervical spine was performed following the standard protocol without intravenous contrast. Multiplanar CT image reconstructions of the cervical spine were also generated. RADIATION DOSE REDUCTION: This exam was performed according to the departmental dose-optimization program which includes automated exposure control, adjustment of the mA and/or kV according to patient size and/or use of iterative reconstruction technique. COMPARISON:  CT head and cervical spine  12/11/2019 FINDINGS: CT HEAD FINDINGS Brain: There is no evidence of an acute large territory  infarct, intracranial hemorrhage, mass, midline shift, or extra-axial fluid collection. A small chronic right occipital infarct is new. Small chronic left cerebellar infarcts are also present. There is moderate cerebral atrophy. Periventricular white matter hypodensities are nonspecific but compatible with mild chronic small vessel ischemic disease. Vascular: Calcified atherosclerosis at the skull base. No hyperdense vessel. Skull: No acute fracture or suspicious osseous lesion. Sinuses/Orbits: Visualized paranasal sinuses and mastoid air cells are clear. Bilateral cataract extraction. Other: None. CT CERVICAL SPINE FINDINGS Alignment: Mild chronic reversal of the normal cervical lordosis with grade 1 anterolisthesis of C3 on C4 and C4 on C5, unchanged. Skull base and vertebrae: No acute fracture or suspicious osseous lesion. Unchanged well corticated ossicle at the tip of the C7 spinous process. Soft tissues and spinal canal: No prevertebral fluid or swelling. No visible canal hematoma. Disc levels: Mild disc degeneration. Advanced upper cervical facet arthrosis. Asymmetrically advanced right neural foraminal stenosis at C3-4. Upper chest: Clear lung apices. Other: Heavily calcified atherosclerotic plaque at the carotid bifurcations. IMPRESSION: 1. No evidence of acute intracranial abnormality or acute cervical spine fracture. 2. 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 Electronically Signed   By: Dasie Hamburg M.D.   On: 05/19/2023 20:10   CT CERVICAL SPINE WO CONTRAST Result Date: 05/19/2023 CLINICAL DATA:  Altered mental status.  Fall.  History of dementia. EXAM: CT HEAD WITHOUT CONTRAST CT CERVICAL SPINE WITHOUT CONTRAST TECHNIQUE: Multidetector CT imaging of the head and cervical spine was performed following the standard protocol without intravenous contrast. Multiplanar CT image reconstructions of the cervical spine were also generated. RADIATION DOSE REDUCTION: This exam was performed  according to the departmental dose-optimization program which includes automated exposure control, adjustment of the mA and/or kV according to patient size and/or use of iterative reconstruction technique. COMPARISON:  CT head and cervical spine 12/11/2019 FINDINGS: CT HEAD FINDINGS Brain: There is no evidence of an acute large territory infarct, intracranial hemorrhage, mass, midline shift, or extra-axial fluid collection. A small chronic right occipital infarct is new. Small chronic left cerebellar infarcts are also present. There is moderate cerebral atrophy. Periventricular white matter hypodensities are nonspecific but compatible with mild chronic small vessel ischemic disease. Vascular: Calcified atherosclerosis at the skull base. No hyperdense vessel. Skull: No acute fracture or suspicious osseous lesion. Sinuses/Orbits: Visualized paranasal sinuses and mastoid air cells are clear. Bilateral cataract extraction. Other: None. CT CERVICAL SPINE FINDINGS Alignment: Mild chronic reversal of the normal cervical lordosis with grade 1 anterolisthesis of C3 on C4 and C4 on C5, unchanged. Skull base and vertebrae: No acute fracture or suspicious osseous lesion. Unchanged well corticated ossicle at the tip of the C7 spinous process. Soft tissues and spinal canal: No prevertebral fluid or swelling. No visible canal hematoma. Disc levels: Mild disc degeneration. Advanced upper cervical facet arthrosis. Asymmetrically advanced right neural foraminal stenosis at C3-4. Upper chest: Clear lung apices. Other: Heavily calcified atherosclerotic plaque at the carotid bifurcations. IMPRESSION: 1. No evidence of acute intracranial abnormality or acute cervical spine fracture. 2. 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 Electronically Signed   By: Dasie Hamburg M.D.   On: 05/19/2023 20:10    Procedures Procedures    Medications Ordered in ED Medications  acetaminophen  (TYLENOL ) tablet 650 mg (has no  administration in time range)    Or  acetaminophen  (TYLENOL ) suppository 650 mg (has no administration in time range)  melatonin tablet  3 mg (has no administration in time range)  ondansetron  (ZOFRAN ) injection 4 mg (has no administration in time range)  0.9 %  sodium chloride  infusion ( Intravenous New Bag/Given 05/19/23 2317)  cefTRIAXone  (ROCEPHIN ) 1 g in sodium chloride  0.9 % 100 mL IVPB (has no administration in time range)  albuterol  (PROVENTIL ) (2.5 MG/3ML) 0.083% nebulizer solution 2.5 mg (has no administration in time range)  atorvastatin  (LIPITOR) tablet 10 mg (10 mg Oral Given 05/20/23 1002)  metoprolol  succinate (TOPROL -XL) 24 hr tablet 50 mg (50 mg Oral Given 05/20/23 0951)  0.9 %  sodium chloride  infusion ( Intravenous New Bag/Given 05/20/23 1014)  sodium chloride  0.9 % bolus 1,000 mL (0 mLs Intravenous Stopped 05/19/23 2035)  cefTRIAXone  (ROCEPHIN ) 1 g in sodium chloride  0.9 % 100 mL IVPB (0 g Intravenous Stopped 05/19/23 2026)  sodium chloride  0.9 % bolus 1,000 mL (0 mLs Intravenous Stopped 05/19/23 2316)    ED Course/ Medical Decision Making/ A&P                                 Medical Decision Making Amount and/or Complexity of Data Reviewed Labs: ordered. Radiology: ordered.  Risk Decision regarding hospitalization.   CARRINA SCHOENBERGER is a 89 y.o. female with comorbidities that complicate the patient evaluation including dementia (alert and oriented x 2 at baseline), asthma/COPD, hypertension, atrial fibrillation not on anticoagulation, and hyperlipidemia who presents emergency department for altered mental status.   Initial Ddx:  TBI, C-spine injury, rhabdomyolysis, hip fracture, rib fracture, arm fracture, UTI, stroke  MDM/Course:  Patient presents to the emergency department with altered mental status.  Daughter reports that she had an unwitnessed fall.  Is fairly independent and lives at home by herself despite her dementia though does need someone to check up on her daily.   On exam she is not in any acute distress.  No obvious signs of trauma.  Is on me that her forearm hurts on the left side.  She had a CT of the head and C-spine without acute abnormality.  X-ray of the arm without fracture.  Labs show that she has an AKI and likely urinary tract infection.  Upon re-evaluation the patient was stable.  Was given IV fluids and antibiotics.  Admitted to hospitalist since she lives at home and likely will not be able to care for self she is confused.  This patient presents to the ED for concern of complaints listed in HPI, this involves an extensive number of treatment options, and is a complaint that carries with it a high risk of complications and morbidity. Disposition including potential need for admission considered.   Dispo: Admit to Floor  Additional history obtained from daughter Records reviewed Outpatient Clinic Notes The following labs were independently interpreted: Chemistry and show AKI I independently reviewed the following imaging with scope of interpretation limited to determining acute life threatening conditions related to emergency care: CT Head and agree with the radiologist interpretation with the following exceptions: none   I personally reviewed and interpreted the pt's EKG: see above for interpretation  I have reviewed the patients home medications and made adjustments as needed Consults: Hospitalist Social Determinants of health:  Elderly  Portions of this note were generated with Scientist, clinical (histocompatibility and immunogenetics). Dictation errors may occur despite best attempts at proofreading.     Final Clinical Impression(s) / ED Diagnoses Final diagnoses:  Confusion  AKI (acute kidney injury) (HCC)  Acute cystitis  without hematuria  Fall, initial encounter    Rx / DC Orders ED Discharge Orders     None         Yolande Lamar BROCKS, MD 05/20/23 1034

## 2023-05-19 NOTE — H&P (Signed)
 History and Physical      Tammy [name] FMW:[id number] DOB: 07-04-1930 DOA: 05/19/2023; DOS: 05/19/2023  PCP: Okey Carlin Redbird, MD  Patient coming from: home   I have personally briefly reviewed patient's old medical records in [facility]  Chief Complaint: Altered mental status  HPI: Tammy [name] is a 89 y.o. female with medical history significant for dementia, mild intermittent asthma, paroxysmal atrial fibrillation, essential pretension, hyperlipidemia, coronary disease status post CABG in January 1994, chronic diastolic heart failure, who is admitted to [facility] on 05/19/2023 with acute metabolic encephalopathy in the setting of severe sepsis due to acute cystitis after presenting from home to [facility] ED for evaluation of altered mental status.  In the setting the patient's altered mental status, following history is provided by the patient's daughter, in addition to my discussions with the EDP and via chart review.  The patient has a history of dementia, lives by herself at home, with daughter frequently checking on the patient.  Daughter is noted patient to be confused relative to her baseline mental status over the course of the last 1 to 2 days, noting that at baseline, the patient is alert and oriented x 2.  When daughter checked on the patient today, she noted persistence of the aforementioned confusion, and the patient conveyed to her that she had fallen earlier in the day.  Patient conveys that this fall occurred as she tripped relativity ambulate.  She does not believe she hit her head or loss consciousness.  Daughter conveys that she found the patient laying on the couch, with patient conveying that she was able to extricate herself off the floor after this reported ground-level mechanical fall.  She is on a daily baby aspirin  but otherwise no blood thinners as an outpatient.  The patient reports some left forearm discomfort stemming from this fall, but otherwise denies  any new significant acute arthralgias or myalgias as a consequence of the fall.  Denies any headache or new neck pain.    ED Course:  Vital signs in the ED were notable for the following: Afebrile; rates in the 80s; systolic blood pressures in the 90s to 120s, with most recent blood pressure 115/69, respiratory rate 16-23, oxygen  saturation 95 to 100% on room air.  Labs were notable for the following: VBG 7.32/45.  CMP notable for the following: Sodium 137, bicarbonate 21, anion gap 10, creatinine 1.76 compared to most recent prior creatinine did point to 0.79 in December 2021, BUN/creatinine ratio 22.2, liver enzymes within normal limits.  Total CPK 224.  Serum ethanol level less than 10 and urinary dressing was pan negative.  CBC notable for Locasol count 13,900 with 73% neutrophils.  Urinalysis showed greater than 50 white blood cells, large leukocyte esterase, no squamous epithelial cells, while showing 100 protein and no RBCs.  Per my interpretation, EKG in ED demonstrated the following: Interpretation of EKG was limited by the presence of motion artifact, though within these confines, appears to show sinus rhythm with heart rate 90, and no overt evidence of T wave or ST changes, Cleen evidence of ST elevation.  Imaging in the ED, per corresponding formal radiology read, was notable for the following: 1 view chest x-ray showed no evidence of acute cardiopulmonary process, Cleen evidence of infiltrate, edema, effusion, or pneumothorax.  Plan comes in the form showed no evidence of acute traumatic injury.  Plain bones of the pelvis showed no evidence of acute traumatic injury.  Noncontrast CT head showed  no evidence of acute intracranial process, including no evidence of intracranial strain and subacute infarct.  CT cervical spine showed evidence of acute cervical spine fracture or subluxation injury.  While in the ED, the following were administered: A 1 L normal saline bolus,  Rocephin .  Subsequently, the patient was admitted for further evaluation and management of presenting acute metabolic encephalopathy in the setting of severe sepsis due to acute cystitis, with patient also reporting ground-level mechanical fall earlier in the day, unwitnessed, and with presenting labs notable for acute kidney injury and clinical evidence to suggest dehydration.    Review of Systems: As per HPI otherwise 10 point review of systems negative.   Past Medical History:  Diagnosis Date   Acid reflux    Asthma    Asthmatic COPD   Atrial fibrillation (HCC) 01/01/2016   Coronary artery disease    Echo 07/27/2011    Coronary artery stenosis    , High-grade left main   Dementia (HCC)    Essential tremor 03/16/2018   GERD (gastroesophageal reflux disease)    Hyperlipidemia    Hypertension    IBS (irritable bowel syndrome)    Psoriasis     Past Surgical History:  Procedure Laterality Date   CARDIAC SURGERY     CORONARY ARTERY BYPASS GRAFT  1994   After catheterization showed 90% ostial left main stenosis   DOPPLER ECHOCARDIOGRAPHY     Study showed mild to moderate MR with moderate TR, mild to moderate pulmonary hypertension with an ejection fraction greater that 55%.    EYE SURGERY     Nuclear Perfusion  10/2010, 11/15/2012   Showed normal perfusion, No Ischemia or Infarction, Normal EF   Renal Scan Duplex  04/09/2012   Showing greater than 50% diameter reduction in the celiac artery and SMA.. Right proximal 275 and Mid 152.   ROTATOR CUFF REPAIR     bilateral   Sleep Study  12/28/2006   AHI during total sleep time (3h 19 minutes) was 1.81/hr and during REM sleep at 17.78/hr. Mild sleep apnea during REM sleep.Oxygen  staturated rate during REM and NREM was 93.0%.    Social History:  reports that she quit smoking about 60 years ago. She started smoking about 78 years ago. She has a 18 pack-year smoking history. She has never used smokeless tobacco. She reports that she  does not drink alcohol and does not use drugs.   Allergies  Allergen Reactions   Penicillins Hives    Has patient had a PCN reaction causing immediate rash, facial/tongue/throat swelling, SOB or lightheadedness with hypotension: No Has patient had a PCN reaction causing severe rash involving mucus membranes or skin necrosis: Yes Has patient had a PCN reaction that required hospitalization No Has patient had a PCN reaction occurring within the last 10 years: Yes took again last year 2016 If all of the above answers are NO, then may proceed with Cephalosporin use.    Naprosyn [Naproxen] Nausea Only    diarrhea   Sertraline Diarrhea   Lactose Intolerance (Gi) Other (See Comments)    Unknown- [facility] documented    Family History  Problem Relation Age of Onset   Heart disease Mother    Hypertension Sister    Colon cancer Brother    Cancer Brother     Family history reviewed and not pertinent    Prior to Admission medications   Medication Sig Start Date End Date Taking? Authorizing Provider  acetaminophen  (TYLENOL ) 325 MG tablet  Take 2 tablets (650 mg total) by mouth every 6 (six) hours as needed for mild pain or moderate pain. 12/16/19   Rai, Nydia POUR, MD  albuterol  (VENTOLIN  HFA) 108 (90 Base) MCG/ACT inhaler Inhale 2 puffs into the lungs every 6 (six) hours as needed for wheezing (cough). 02/11/20   Ngetich, Dinah C, NP  aspirin  81 MG tablet Take 81 mg by mouth daily.     [provider]  atorvastatin  (LIPITOR) 10 MG tablet Take 1 tablet (10 mg total) by mouth daily. 02/11/20   Ngetich, Dinah C, NP  folic acid  (FOLVITE ) 1 MG tablet Take 1 tablet (1 mg total) by mouth daily. 01/06/16   Rai, Nydia POUR, MD  furosemide  (LASIX ) 20 MG tablet Take 1 tablet (20 mg total) by mouth daily. 02/11/20   Ngetich, Dinah C, NP  irbesartan  (AVAPRO ) 300 MG tablet Take 1 tablet (300 mg total) by mouth daily. 02/11/20   Ngetich, Dinah C, NP  metoprolol  succinate  (TOPROL -XL) 50 MG 24 hr tablet Take 1 tablet by mouth daily.    [provider]  pantoprazole  (PROTONIX ) 40 MG tablet TAKE 1 TABLET(40 MG) BY MOUTH TWICE DAILY 04/23/20   Kozlow, Camellia PARAS, MD     Objective    Physical Exam: Vitals:   05/19/23 1645 05/19/23 1730 05/19/23 1830 05/19/23 1935  BP:  (!) 126/48 101/69 90/67  Pulse:  88 89   Resp:  18 16 (!) 23  Temp:  (!) 97.5 F (36.4 C) 98.1 F (36.7 C)   TempSrc:  Oral Oral   SpO2: 96% 100% 95%     General: appears to be stated age; alert confused  Skin: warm, dry, no rash Head:  AT/[place] Mouth:  Oral mucosa membranes appear dry, normal dentition Neck: supple; trachea midline Heart:  RRR; did not appreciate any M/R/G Lungs: CTAB, did not appreciate any wheezes, rales, or rhonchi Abdomen: + BS; soft, ND, NT Vascular: 2+ pedal pulses b/l; 2+ radial pulses b/l Extremities: no peripheral edema, no muscle wasting Neuro: strength and sensation intact in upper and lower extremities b/l    Labs on Admission: I have personally reviewed following labs and imaging studies  CBC: Recent Labs  Lab 05/19/23 1826  WBC 13.9*  NEUTROABS 10.0*  HGB 11.9*  HCT 35.8*  MCV 88.8  PLT 254   Basic Metabolic Panel: Recent Labs  Lab 05/19/23 1826  NA 137  K 4.7  CL 106  CO2 21*  GLUCOSE 122*  BUN 39*  CREATININE 1.76*  CALCIUM  8.9   GFR: CrCl cannot be calculated (Unknown ideal weight.). Liver Function Tests: Recent Labs  Lab 05/19/23 1826  AST 20  ALT 12  ALKPHOS 69  BILITOT 1.0  PROT 7.8  ALBUMIN 3.8   No results for input(s): LIPASE, AMYLASE in the last 168 hours. No results for input(s): AMMONIA in the last 168 hours. Coagulation Profile: No results for input(s): INR, PROTIME in the last 168 hours. Cardiac Enzymes: Recent Labs  Lab 05/19/23 1826  CKTOTAL 224   BNP (last 3 results) No results for input(s): PROBNP in the last 8760 hours. HbA1C: No results for input(s): HGBA1C in the last 72  hours. CBG: Recent Labs  Lab 05/19/23 1823  GLUCAP 107*   Lipid Profile: No results for input(s): CHOL, HDL, LDLCALC, TRIG, CHOLHDL, LDLDIRECT in the last 72 hours. Thyroid  Function Tests: No results for input(s): TSH, T4TOTAL, FREET4, T3FREE, THYROIDAB in the last 72 hours. Anemia Panel: No results for input(s): VITAMINB12, FOLATE,  FERRITIN, TIBC, IRON, RETICCTPCT in the last 72 hours. Urine analysis:    Component Value Date/Time   COLORURINE YELLOW 05/19/2023 1725   APPEARANCEUR TURBID (A) 05/19/2023 1725   LABSPEC 1.012 05/19/2023 1725   PHURINE 5.0 05/19/2023 1725   GLUCOSEU NEGATIVE 05/19/2023 1725   HGBUR NEGATIVE 05/19/2023 1725   BILIRUBINUR NEGATIVE 05/19/2023 1725   KETONESUR NEGATIVE 05/19/2023 1725   PROTEINUR 100 (A) 05/19/2023 1725   UROBILINOGEN 0.2 07/11/2009 2220   NITRITE NEGATIVE 05/19/2023 1725   LEUKOCYTESUR LARGE (A) 05/19/2023 1725    Radiological Exams on Admission: DG Forearm Left Result Date: 05/19/2023 CLINICAL DATA:  Recent fall with forearm pain, initial encounter EXAM: LEFT FOREARM - 2 VIEW COMPARISON:  03/26/2012 FINDINGS: Old healed fracture of the distal radial metaphysis is noted. Nonunion of a an ulnar styloid fracture is noted as well. No acute fracture or dislocation is noted. Degenerative changes are noted in the carpal bones as well as the first [facility] joint. IMPRESSION: Healed fractures and degenerative change without acute abnormality. Electronically Signed   By: Oneil Devonshire M.D.   On: 05/19/2023 20:39   DG Pelvis Portable Result Date: 05/19/2023 CLINICAL DATA:  History of recent falls, initial encounter EXAM: PORTABLE PELVIS 1 VIEWS COMPARISON:  None Available. FINDINGS: Pelvic ring appears intact. Healed fracture is noted in the pubic symphysis on the left stable from prior CT of 2022. No acute fracture or dislocation is noted. No soft tissue abnormality is seen. IMPRESSION: No acute abnormality noted.  Electronically Signed   By: Oneil Devonshire M.D.   On: 05/19/2023 20:32   DG Chest Port 1 View Result Date: 05/19/2023 CLINICAL DATA:  Weakness and history of recent falls, initial encounter EXAM: PORTABLE CHEST 1 VIEW COMPARISON:  12/11/2019 FINDINGS: Cardiac shadow is within normal limits. Postsurgical changes are noted. The lungs are well aerated bilaterally. No focal infiltrate or effusion is seen. No bony abnormality is noted. IMPRESSION: No acute abnormality seen. Electronically Signed   By: Oneil Devonshire M.D.   On: 05/19/2023 20:30   CT HEAD WO CONTRAST Result Date: 05/19/2023 CLINICAL DATA:  Altered mental status.  Fall.  History of dementia. EXAM: CT HEAD WITHOUT CONTRAST CT CERVICAL SPINE WITHOUT CONTRAST TECHNIQUE: Multidetector CT imaging of the head and cervical spine was performed following the standard protocol without intravenous contrast. Multiplanar CT image reconstructions of the cervical spine were also generated. RADIATION DOSE REDUCTION: This exam was performed according to the departmental dose-optimization program which includes automated exposure control, adjustment of the mA and/or kV according to patient size and/or use of iterative reconstruction technique. COMPARISON:  CT head and cervical spine 12/11/2019 FINDINGS: CT HEAD FINDINGS Brain: There is no evidence of an acute large territory infarct, intracranial hemorrhage, mass, midline shift, or extra-axial fluid collection. A small chronic right occipital infarct is new. Small chronic left cerebellar infarcts are also present. There is moderate cerebral atrophy. Periventricular white matter hypodensities are nonspecific but compatible with mild chronic small vessel ischemic disease. Vascular: Calcified atherosclerosis at the skull base. No hyperdense vessel. Skull: No acute fracture or suspicious osseous lesion. Sinuses/Orbits: Visualized paranasal sinuses and mastoid air cells are clear. Bilateral cataract extraction. Other: None. CT  CERVICAL SPINE FINDINGS Alignment: Mild chronic reversal of the normal cervical lordosis with grade 1 anterolisthesis of C3 on C4 and C4 on C5, unchanged. Skull base and vertebrae: No acute fracture or suspicious osseous lesion. Unchanged well corticated ossicle at the tip of the C7 spinous process. Soft tissues and spinal canal:  No prevertebral fluid or swelling. No visible canal hematoma. Disc levels: Mild disc degeneration. Advanced upper cervical facet arthrosis. Asymmetrically advanced right neural foraminal stenosis at C3-4. Upper chest: Clear lung apices. Other: Heavily calcified atherosclerotic plaque at the carotid bifurcations. IMPRESSION: 1. No evidence of acute intracranial abnormality or acute cervical spine fracture. 2. 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 Electronically Signed   By: Dasie Hamburg M.D.   On: 05/19/2023 20:10   CT CERVICAL SPINE WO CONTRAST Result Date: 05/19/2023 CLINICAL DATA:  Altered mental status.  Fall.  History of dementia. EXAM: CT HEAD WITHOUT CONTRAST CT CERVICAL SPINE WITHOUT CONTRAST TECHNIQUE: Multidetector CT imaging of the head and cervical spine was performed following the standard protocol without intravenous contrast. Multiplanar CT image reconstructions of the cervical spine were also generated. RADIATION DOSE REDUCTION: This exam was performed according to the departmental dose-optimization program which includes automated exposure control, adjustment of the mA and/or kV according to patient size and/or use of iterative reconstruction technique. COMPARISON:  CT head and cervical spine 12/11/2019 FINDINGS: CT HEAD FINDINGS Brain: There is no evidence of an acute large territory infarct, intracranial hemorrhage, mass, midline shift, or extra-axial fluid collection. A small chronic right occipital infarct is new. Small chronic left cerebellar infarcts are also present. There is moderate cerebral atrophy. Periventricular white matter hypodensities  are nonspecific but compatible with mild chronic small vessel ischemic disease. Vascular: Calcified atherosclerosis at the skull base. No hyperdense vessel. Skull: No acute fracture or suspicious osseous lesion. Sinuses/Orbits: Visualized paranasal sinuses and mastoid air cells are clear. Bilateral cataract extraction. Other: None. CT CERVICAL SPINE FINDINGS Alignment: Mild chronic reversal of the normal cervical lordosis with grade 1 anterolisthesis of C3 on C4 and C4 on C5, unchanged. Skull base and vertebrae: No acute fracture or suspicious osseous lesion. Unchanged well corticated ossicle at the tip of the C7 spinous process. Soft tissues and spinal canal: No prevertebral fluid or swelling. No visible canal hematoma. Disc levels: Mild disc degeneration. Advanced upper cervical facet arthrosis. Asymmetrically advanced right neural foraminal stenosis at C3-4. Upper chest: Clear lung apices. Other: Heavily calcified atherosclerotic plaque at the carotid bifurcations. IMPRESSION: 1. No evidence of acute intracranial abnormality or acute cervical spine fracture. 2. 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 Electronically Signed   By: Dasie Hamburg M.D.   On: 05/19/2023 20:10      Assessment/Plan    Principal Problem:   Acute metabolic encephalopathy Active Problems:   HLD (hyperlipidemia)   Essential hypertension   Paroxysmal atrial fibrillation (HCC)   Fall at home, initial encounter   AKI (acute kidney injury) (HCC)   Dehydration   Chronic diastolic CHF (congestive heart failure) (HCC)   Severe sepsis (HCC)   Acute cystitis   Acute prerenal azotemia   Mild intermittent asthma    #) Acute metabolic encephalopathy: 1 to 2 days of confusion relative to baseline mental status, in which patient is known to have underlying dementia and was reported to be alert and oriented x 2 at baseline. This appears to be on the basis of physiologic stressors stemming from presenting severe  sepsis due to acute cystitis, as below.  No obvious additional contributory underlying infectious process at this time, including chest x-ray which showed no evidence of acute process, including no evidence of infiltrate.  Will add on procalcitonin level, given that her presenting dehydration could lead to a false radiographic negative.   Aside from clinical evidence of dehydration, no additional overt metabolic/electrolyte  contributions at this time.  Total CPK level nonelevated.  Urinary drug screen was pan negative.  VBG showed no evidence of contributory hypercapnia.  No obvious contributory pharmacologic factors. No overt acute focal neurologic deficits to suggest a contribution from an underlying acute CVA. Seizures are also felt to be less likely.    Plan: fall precautions. Delirium precautions. Repeat CMP/CBC in the AM. Check Mg level. check TSH, B12 level.  Further evaluation management of presenting severe sepsis due to acute cystitis, as below.  Add on procalcitonin level, as above.                  #) Severe sepsis due to acute cystitis: In the setting of the patient's 1 to 2 days of confusion relative to baseline mental status, her urinalysis appears suggestive of UTI in the setting of significant pyuria, large leukocyte esterase, and no evidence of squamous epithelial cells to suggest a contaminated specimen.  SIRS criteria met via leukocytosis with neutrophilic predominance, tachypnea. Lactic acid level: Has been ordered, and is currently pending. Of note, given the associated presence of suspected end organ damage in the form of concominant presenting acute metabolic encephalopathy, criteria are met for pt's sepsis to be considered severe in nature. However, in the absence of lactic acid level that is greater than or equal to 4.0, and in the absence of any associated hypotension refractory to IVF's, there are no indications for administration of a 30 mL/kg IVF bolus at this  time.   Additional ED work-up/management notable for: Started on Rocephin  in the ED, which we continued for empiric coverage of uncomplicated acute cystitis.  No additional evidence of underlying factious process at this time, including chest x-ray that showed no evidence of infiltrate.  Will add on procalcitonin level, as above.  She has received a 1 L NS bolus in the ED.  Plan: CBC w/ diff and CMP in AM.  Continue Rocephin .  Check blood cultures x 2.  Add on urine culture.  Will provide another 1 L NS bolus at this time, followed by continuous NS running at 100 cc/h.  Check stat lactic acid every 3 hours x 2 occurrences.  Procalcitonin level, as above.  Prn acetaminophen  for fever.                  #) Ground level mechanical fall: Unwitnessed fall earlier in the day, as reported by patient, which, per patient's report was mechanical in nature as the patient tripped while turning ambulate at home, the patient denying any associated loss of consciousness and reports that she did not hit her head as a component of this fall.  Has some mild acute left forearm discomfort after this fall, with associated plain films negative.  Otherwise, denies any significant associated acute arthralgias or myalgias.  Additional imaging shows no evidence of acute traumatic injury, fracture or dislocation.  The patient is noted to be on daily baby aspirin  at home.  CT head showed noes acute process, Cleen evidence of intracranial large, will CT cervical spine showed evidence of acute process.  Suspect that presenting severe sepsis due to acute cystitis already has a predisposing factor increasing risk for today's reported ground-level mechanical fall.  It is noted that at baseline, the patient lives by herself at home.  Plan: Fall precautions. Repeat CMP and CBC with differential in the morning.  PT and OT consult ordered.  Further evaluation management of presenting severe sepsis due to acute cystitis.   Prn acetaminophen   for pain.                #) Acute Kidney Injury: Presenting creatinine is 1.76, compared to most recent prior value of 0.79 in December 2021.  While there is a significant temporal gap in available creatinine data points, this is suspected to represent an acute kidney injury, which appears prerenal in nature, consistent with laboratory finding of acute prerenal azotemia in the setting of clinical evidence of dehydration as well as contribution from severe sepsis due to urinary tract infection.  Presenting urinalysis with microscopy shows 100 protein, but no evidence of additional significant urinary cast, including no evidence of RBCs.  There is also the potential for pharmacologic contribution in the setting of her outpatient irbesartan , as well as potential exacerbation to the dehydration component stemming from her use of outpatient Lasix .  Plan: monitor strict I's & O's and daily weights. Attempt to avoid nephrotoxic agents.  Hold home irbesartan  as well as Lasix  for now.  Refrain from NSAIDs. Repeat CMP in the morning. Check serum magnesium level. Add-on random urine sodium and random urine creatinine.  IV fluids, as above.                      #) Dehydration: Clinical suspicion for such, including the appearance of dry oral mucous membranes as well as laboratory findings notable for acute prerenal azotemia.  Suspect contribution from diminished oral intake over the last day in the setting of the patient's presenting acute metabolic encephalopathy.   Plan: Monitor strict I's and O's.  Daily weights.  CMP in the morning. IVF's, as above.  Home Lasix  for now.                  #) Mild intermittent asthma: documented history thereof, without clinical e/o to suggest current exacerbation. Outpatient respiratory regimen includes prn albuterol  inhaler.   Plan: Check serum magnesium level.  As needed albuterol   nebulizer.                 #) Paroxysmal atrial fibrillation: Documented history of such. In setting of CHA2DS2-VASc score of  5, there is an indication for chronic anticoagulation for thromboembolic prophylaxis.  However, she is not on formal anticoagulation as an outpatient, rather is on daily baby aspirin .  Suspect that this is on the basis of her fall risk, but will attempt additional chart review to further ascertain.  Home AV nodal blocking regimen: Metoprolol  succinate.  Most recent echocardiogram was performed in July 2021 and was notable for LVEF 60 to 65%, grade 1 diastolic dysfunction and no evidence of significant valvular pathology. Presenting EKG is limited by the presence of motion artifact, but appears to show sinus rhythm without overt evidence of acute ischemic changes, as above..   Plan: monitor strict I's & O's and daily weights. CMP/CBC in AM. Check serum mag level. Continue home AV nodal blocking regimen.  Monitor on telemetry.               #) Essential Hypertension: documented h/o such, with outpatient antihypertensive regimen including irbesartan , metoprolol  succinate, Lasix .  SBP's in the ED today: 90s to 120s mmHg. however, in the setting of her presenting severe sepsis as well as clinical evidence of dehydration with resultant aki, will hold him losartan and Lasix  for now.  Plan: Close monitoring of subsequent BP via routine VS. hold home Lasix  and losartan, as above.  Continue outpatient pack blocker.  Monitor strict I's and O's Daily  weights.                   #) Hyperlipidemia: documented h/o such. On atorvastatin  as outpatient.  Of note, no elevation in CPK level when checked this evening.   Plan: continue home statin.                     #) Chronic diastolic heart failure: documented history of such, with most recent echocardiogram performed in July 2021, which is notable for LVEF 60 to 65%, grade 1  diastolic dysfunction with normal right ventricular systolic function and no evidence of significant valvular pathology. No clinical or radiographic evidence to suggest acutely decompensated heart failure at this time. home diuretic regimen reportedly consists of the following: Lasix , which will be held for now in the setting of clinical evidence of dehydration.  As planned for her severe sepsis, acute kidney injury, dehydration includes gentle IV fluids, will closely monitor for ensuing evidence to suggest developing acute volume overload, as further detailed below.  Plan: monitor strict I's & O's and daily weights. Repeat CMP in AM. Check serum mag level.  Hold home Lasix .  Add on BNP.    DVT prophylaxis: SCD's   Code Status: Full code, presumed in setting of presenting acute metabolic encephalopathy, while noting most recent prior code status documentation from most recent prior hospitalization conveying full CODE STATUS. Family Communication: none Disposition Plan: Per Rounding Team Consults called: none;  Admission status: Inpatient    I SPENT GREATER THAN 75  MINUTES IN CLINICAL CARE TIME/MEDICAL DECISION-MAKING IN COMPLETING THIS ADMISSION.     Tammy NOVAK Driana Dazey DO [facility] From 7PM - 7AM   05/19/2023, 9:29 PM

## 2023-05-20 ENCOUNTER — Encounter (HOSPITAL_COMMUNITY): Payer: Self-pay | Admitting: Internal Medicine

## 2023-05-20 DIAGNOSIS — N179 Acute kidney failure, unspecified: Secondary | ICD-10-CM

## 2023-05-20 DIAGNOSIS — G9341 Metabolic encephalopathy: Secondary | ICD-10-CM

## 2023-05-20 DIAGNOSIS — I480 Paroxysmal atrial fibrillation: Secondary | ICD-10-CM

## 2023-05-20 DIAGNOSIS — N3000 Acute cystitis without hematuria: Secondary | ICD-10-CM

## 2023-05-20 DIAGNOSIS — I10 Essential (primary) hypertension: Secondary | ICD-10-CM

## 2023-05-20 LAB — COMPREHENSIVE METABOLIC PANEL
ALT: 11 U/L (ref 0–44)
AST: 16 U/L (ref 15–41)
Albumin: 2.7 g/dL — ABNORMAL LOW (ref 3.5–5.0)
Alkaline Phosphatase: 52 U/L (ref 38–126)
Anion gap: 6 (ref 5–15)
BUN: 28 mg/dL — ABNORMAL HIGH (ref 8–23)
CO2: 20 mmol/L — ABNORMAL LOW (ref 22–32)
Calcium: 7.8 mg/dL — ABNORMAL LOW (ref 8.9–10.3)
Chloride: 116 mmol/L — ABNORMAL HIGH (ref 98–111)
Creatinine, Ser: 1.13 mg/dL — ABNORMAL HIGH (ref 0.44–1.00)
GFR, Estimated: 46 mL/min — ABNORMAL LOW (ref >60)
Glucose, Bld: 96 mg/dL (ref 70–99)
Potassium: 4.4 mmol/L (ref 3.5–5.1)
Sodium: 142 mmol/L (ref 135–145)
Total Bilirubin: 0.4 mg/dL (ref 0.0–1.2)
Total Protein: 5.7 g/dL — ABNORMAL LOW (ref 6.5–8.1)

## 2023-05-20 LAB — BRAIN NATRIURETIC PEPTIDE: B Natriuretic Peptide: 243.2 pg/mL — ABNORMAL HIGH (ref 0.0–100.0)

## 2023-05-20 LAB — CBC WITH DIFFERENTIAL/PLATELET
Abs Immature Granulocytes: 0.02 10*3/uL (ref 0.00–0.07)
Basophils Absolute: 0 10*3/uL (ref 0.0–0.1)
Basophils Relative: 0 %
Eosinophils Absolute: 0.1 10*3/uL (ref 0.0–0.5)
Eosinophils Relative: 1 %
HCT: 29.6 % — ABNORMAL LOW (ref 36.0–46.0)
Hemoglobin: 9.4 g/dL — ABNORMAL LOW (ref 12.0–15.0)
Immature Granulocytes: 0 %
Lymphocytes Relative: 26 %
Lymphs Abs: 1.9 10*3/uL (ref 0.7–4.0)
MCH: 29 pg (ref 26.0–34.0)
MCHC: 31.8 g/dL (ref 30.0–36.0)
MCV: 91.4 fL (ref 80.0–100.0)
Monocytes Absolute: 0.8 10*3/uL (ref 0.1–1.0)
Monocytes Relative: 11 %
Neutro Abs: 4.3 10*3/uL (ref 1.7–7.7)
Neutrophils Relative %: 62 %
Platelets: 173 10*3/uL (ref 150–400)
RBC: 3.24 MIL/uL — ABNORMAL LOW (ref 3.87–5.11)
RDW: 13 % (ref 11.5–15.5)
WBC: 7.1 10*3/uL (ref 4.0–10.5)
nRBC: 0 % (ref 0.0–0.2)

## 2023-05-20 LAB — CREATININE, URINE, RANDOM: Creatinine, Urine: 118 mg/dL

## 2023-05-20 LAB — I-STAT CG4 LACTIC ACID, ED: Lactic Acid, Venous: 0.5 mmol/L (ref 0.5–1.9)

## 2023-05-20 LAB — MAGNESIUM: Magnesium: 1.9 mg/dL (ref 1.7–2.4)

## 2023-05-20 LAB — PROCALCITONIN: Procalcitonin: <0.1 ng/mL

## 2023-05-20 LAB — SODIUM, URINE, RANDOM: Sodium, Ur: 63 mmol/L

## 2023-05-20 MED ORDER — SODIUM CHLORIDE 0.9 % IV SOLN: INTRAVENOUS | Status: DC

## 2023-05-20 NOTE — Evaluation (Signed)
 Clinical/Bedside Swallow Evaluation Patient Details  Name: Tammy [name] MRN: [id number] Date of Birth: [birth year]  Today's Date: 05/20/2023 Time: SLP Start Time (ACUTE ONLY): 1304 SLP Stop Time (ACUTE ONLY): 1320 SLP Time Calculation (min) (ACUTE ONLY): 16 min  Past Medical History:  Past Medical History:  Diagnosis Date   Acid reflux    Asthma    Asthmatic COPD   Atrial fibrillation (HCC) 01/01/2016   Coronary artery disease    Echo 07/27/2011    Coronary artery stenosis    , High-grade left main   Dementia (HCC)    Essential tremor 03/16/2018   GERD (gastroesophageal reflux disease)    Hyperlipidemia    Hypertension    IBS (irritable bowel syndrome)    Psoriasis    Past Surgical History:  Past Surgical History:  Procedure Laterality Date   CARDIAC SURGERY     CORONARY ARTERY BYPASS GRAFT  1994   After catheterization showed 90% ostial left main stenosis   DOPPLER ECHOCARDIOGRAPHY     Study showed mild to moderate MR with moderate TR, mild to moderate pulmonary hypertension with an ejection fraction greater that 55%.    EYE SURGERY     Nuclear Perfusion  10/2010, 11/15/2012   Showed normal perfusion, No Ischemia or Infarction, Normal EF   Renal Scan Duplex  04/09/2012   Showing greater than 50% diameter reduction in the celiac artery and SMA.. Right proximal 275 and Mid 152.   ROTATOR CUFF REPAIR     bilateral   Sleep Study  12/28/2006   AHI during total sleep time (3h 19 minutes) was 1.81/hr and during REM sleep at 17.78/hr. Mild sleep apnea during REM sleep.Oxygen  staturated rate during REM and NREM was 93.0%.   HPI:  Tammy [name] is a 89 y.o. female with medical history significant for dementia, mild intermittent asthma, paroxysmal atrial fibrillation, essential pretension, hyperlipidemia, coronary disease status post CABG in January 1994, chronic diastolic heart failure, who is admitted to [facility] on 05/19/2023 with acute metabolic encephalopathy in the  setting of severe sepsis due to acute cystitis after presenting from home to [facility] ED for evaluation of altered mental status. Chest x-ray showed no evidence of acute cardiopulmonary process, evidence of infiltrate, edema, effusion, or pneumothorax.  Noncontrast CT head showed no evidence of acute intracranial process, including no evidence of intracranial strain and subacute infarct.  CT cervical spine showed evidence of acute cervical spine fracture or subluxation injury. BSE in 2021 functional oropharyngeal swallow, recommend regular, thin liquids.    Assessment / Plan / Recommendation  Clinical Impression  Pt able to follow commands however verbose, distracted by SLP and needed cues to attend to eating. Her dentition is poor, missing all upper, has 3 lower teeth and in poor condition. Oromotor abilities functional and adequately strong volitional cough. Her swallows with cup and straw sips thin liquid appeared timely without s/s aspiration throughout evaluation. She did exhibit difficulty masticating likely due to poor/missing dentition. Prolonged mastication with fruit and eventually needed to expectorate. Mildly prolonged mastication with the breaded fish but able to clear oral cavity. SLP downgraded to Dys 3, continue thin liquid, meds with thin (if difficulty, RN may give whole in applesauce). Suspect pt will not be able to upgrade during this hospitalization and will need to remain on Dys 3. No follow up is needed at this time and pt will likely be able to upgrade when medical status improves once home. SLP Visit Diagnosis: Dysphagia, unspecified (R13.10)  Aspiration Risk  Mild aspiration risk    Diet Recommendation Thin liquid;Dysphagia 3 (Mech soft)    Liquid Administration via: Cup;Straw Medication Administration: Whole meds with liquid Supervision: Patient able to self feed (intermittent supervision to ensure she is able to adequately self feed) Compensations: Slow rate;Small  sips/bites;Lingual sweep for clearance of pocketing Postural Changes: Seated upright at 90 degrees;Remain upright for at least 30 minutes after po intake (due to history of GERD)    Other  Recommendations Oral Care Recommendations: Oral care BID    Recommendations for follow up therapy are one component of a multi-disciplinary discharge planning process, led by the attending physician.  Recommendations may be updated based on patient status, additional functional criteria and insurance authorization.  Follow up Recommendations No SLP follow up      Assistance Recommended at Discharge    Functional Status Assessment Patient has not had a recent decline in their functional status  Frequency and Duration            Prognosis        Swallow Study   General Date of Onset: 05/20/23 HPI: Tammy [name] is a 89 y.o. female with medical history significant for dementia, mild intermittent asthma, paroxysmal atrial fibrillation, essential pretension, hyperlipidemia, coronary disease status post CABG in January 1994, chronic diastolic heart failure, who is admitted to [facility] on 05/19/2023 with acute metabolic encephalopathy in the setting of severe sepsis due to acute cystitis after presenting from home to [facility] ED for evaluation of altered mental status. Chest x-ray showed no evidence of acute cardiopulmonary process, evidence of infiltrate, edema, effusion, or pneumothorax.  Noncontrast CT head showed no evidence of acute intracranial process, including no evidence of intracranial strain and subacute infarct.  CT cervical spine showed evidence of acute cervical spine fracture or subluxation injury. BSE in 2021 functional oropharyngeal swallow, recommend regular, thin liquids. Type of Study: Bedside Swallow Evaluation Previous Swallow Assessment:  (see HPI) Diet Prior to this Study: Regular;Thin liquids (Level 0) Temperature Spikes Noted: No Respiratory Status: Room air History of Recent  Intubation: No Behavior/Cognition: Alert;Cooperative;Pleasant mood;Confused;Requires cueing Oral Cavity Assessment: Within Functional Limits Oral Care Completed by SLP: No Oral Cavity - Dentition: Poor condition;Missing dentition (3 lower teeth, no upper) Vision: Functional for self-feeding Self-Feeding Abilities: Able to feed self Patient Positioning: Upright in bed Baseline Vocal Quality: Normal Volitional Cough: Strong Volitional Swallow: Able to elicit    Oral/Motor/Sensory Function Overall Oral Motor/Sensory Function: Within functional limits   Ice Chips Ice chips: Not tested   Thin Liquid Thin Liquid: Within functional limits Presentation: Cup;Straw    Nectar Thick Nectar Thick Liquid: Not tested   Honey Thick Honey Thick Liquid: Not tested   Puree Puree: Not tested   Solid     Solid: Impaired Oral Phase Impairments: Impaired mastication;Other (comment) (due to lack of dentition) Oral Phase Functional Implications: Impaired mastication;Prolonged oral transit;Other (comment) (needed to expectorate fruit) Pharyngeal Phase Impairments:  (none)      Shandy Checo, Olam Bull 05/20/2023,1:58 PM

## 2023-05-20 NOTE — ED Notes (Signed)
 Pt incontinent of urine, changed brief and cleansed skin

## 2023-05-20 NOTE — ED Notes (Signed)
 Sent another gold top to lab

## 2023-05-20 NOTE — Progress Notes (Signed)
 [facility] PROGRESS NOTE   Tammy [name] FMW:[id number] DOB: 11-Nov-1930 DOA: 05/19/2023  PCP: Okey Carlin Redbird, MD  Brief History: 89 y.o. female with medical history significant for dementia, mild intermittent asthma, paroxysmal atrial fibrillation, essential pretension, hyperlipidemia, coronary disease status post CABG in January 1994, chronic diastolic heart failure, who was admitted to [facility] on 05/19/2023 with acute metabolic encephalopathy in the setting of severe sepsis due to acute cystitis after presenting from home to [facility] ED for evaluation of altered mental status.   Consultants: None  Procedures: None    Subjective/Interval History: Patient pleasantly confused.  Does not appear to be in any discomfort.  Unable to answer orientation questions.  Denies any pain issues currently.    Assessment/Plan:  Acute metabolic encephalopathy This is in the setting of underlying dementia.  Encephalopathy thought to be due to urinary tract infection and sepsis.  Reorient daily.  Does not appear to have any focal neurological deficits.  She is physically deconditioned though.  Urinary tract infection with severe sepsis Noted to be on ceftriaxone .  Follow-up on culture data. WBC was elevated at 13.9 and noted to be normal this morning.  Lactic acid level was 0.5.  Acute kidney injury Baseline creatinine is normal.  Presented with creatinine of 1.76.  Improved with IV hydration.  Monitor urine output.  IV fluids for another 24 hours.  Mechanical fall No obvious injuries identified on examination.  Imaging studies also did not show any injuries.  PT and OT evaluation.  History of mild intermittent asthma Stable.  Paroxysmal atrial fibrillation Not noted to be on anticoagulation.  Continue with metoprolol .  Essential hypertension Monitor blood pressures closely.  Furosemide  and losartan on hold due to AKI.  Hyperlipidemia Statin.  Chronic diastolic  CHF Stable.  Was hypovolemic.  Continue with IV fluids for another 24 hours.  Normocytic anemia Drop in hemoglobin is noted and likely dilutional.  No overt bleeding noted.  Recheck labs tomorrow.  Anemia panel.   DVT Prophylaxis: SCDs Code Status: Full code Family Communication: No family at bedside Disposition Plan: Hopefully return home when improved.  Status is: Inpatient Remains inpatient appropriate because: Acute metabolic encephalopathy, UTI requiring IV antibiotics      Medications: Scheduled:  atorvastatin   10 mg Oral Daily   metoprolol  succinate  50 mg Oral Daily   Continuous:  sodium chloride      cefTRIAXone  (ROCEPHIN )  IV     PRN:acetaminophen  **OR** acetaminophen , albuterol , melatonin, ondansetron  (ZOFRAN ) IV  Antibiotics: Anti-infectives (From admission, onward)    Start     Dose/Rate Route Frequency Ordered Stop   05/20/23 1600  cefTRIAXone  (ROCEPHIN ) 1 g in sodium chloride  0.9 % 100 mL IVPB        1 g 200 mL/hr over 30 Minutes Intravenous Every 24 hours 05/19/23 2128     05/19/23 1900  cefTRIAXone  (ROCEPHIN ) 1 g in sodium chloride  0.9 % 100 mL IVPB        1 g 200 mL/hr over 30 Minutes Intravenous  Once 05/19/23 1857 05/19/23 2026       Objective:  Vital Signs  Vitals:   05/20/23 0330 05/20/23 0433 05/20/23 0600 05/20/23 0730  BP: (!) 129/38  (!) 124/43 125/68  Pulse: 76  62 66  Resp: (!) 22  18 18   Temp:  97.7 F (36.5 C)  98.2 F (36.8 C)  TempSrc:    Oral  SpO2: 96%  92% 90%    Intake/Output Summary (Last 24 hours) at  05/20/2023 0951 Last data filed at 05/19/2023 2316 Gross per 24 hour  Intake 2132.8 ml  Output --  Net 2132.8 ml   There were no vitals filed for this visit.  General appearance: Awake alert.  In no distress.  Distracted Resp: Clear to auscultation bilaterally.  Normal effort Cardio: S1-S2 is normal regular.  No S3-S4.  No rubs murmurs or bruit GI: Abdomen is soft.  Nontender nondistended.  Bowel sounds are present  normal.  No masses organomegaly Extremities: No edema.  Moving all of her extremities.  Physical deconditioning is noted. Neurologic: Disoriented.  No obvious focal neurological deficits.   Lab Results:  Data Reviewed: I have personally reviewed following labs and reports of the imaging studies  CBC: Recent Labs  Lab 05/19/23 1826 05/20/23 0522  WBC 13.9* 7.1  NEUTROABS 10.0* 4.3  HGB 11.9* 9.4*  HCT 35.8* 29.6*  MCV 88.8 91.4  PLT 254 173    Basic Metabolic Panel: Recent Labs  Lab 05/19/23 1826 05/20/23 0522  NA 137 142  K 4.7 4.4  CL 106 116*  CO2 21* 20*  GLUCOSE 122* 96  BUN 39* 28*  CREATININE 1.76* 1.13*  CALCIUM  8.9 7.8*  MG 2.3 1.9    GFR: CrCl cannot be calculated (Unknown ideal weight.).  Liver Function Tests: Recent Labs  Lab 05/19/23 1826 05/20/23 0522  AST 20 16  ALT 12 11  ALKPHOS 69 52  BILITOT 1.0 0.4  PROT 7.8 5.7*  ALBUMIN 3.8 2.7*    Cardiac Enzymes: Recent Labs  Lab 05/19/23 1826  CKTOTAL 224   CBG: Recent Labs  Lab 05/19/23 1823  GLUCAP 107*     Radiology Studies: DG Forearm Left Result Date: 05/19/2023 CLINICAL DATA:  Recent fall with forearm pain, initial encounter EXAM: LEFT FOREARM - 2 VIEW COMPARISON:  03/26/2012 FINDINGS: Old healed fracture of the distal radial metaphysis is noted. Nonunion of a an ulnar styloid fracture is noted as well. No acute fracture or dislocation is noted. Degenerative changes are noted in the carpal bones as well as the first [facility] joint. IMPRESSION: Healed fractures and degenerative change without acute abnormality. Electronically Signed   By: Oneil Devonshire M.D.   On: 05/19/2023 20:39   DG Pelvis Portable Result Date: 05/19/2023 CLINICAL DATA:  History of recent falls, initial encounter EXAM: PORTABLE PELVIS 1 VIEWS COMPARISON:  None Available. FINDINGS: Pelvic ring appears intact. Healed fracture is noted in the pubic symphysis on the left stable from prior CT of 2022. No acute fracture or  dislocation is noted. No soft tissue abnormality is seen. IMPRESSION: No acute abnormality noted. Electronically Signed   By: Oneil Devonshire M.D.   On: 05/19/2023 20:32   DG Chest Port 1 View Result Date: 05/19/2023 CLINICAL DATA:  Weakness and history of recent falls, initial encounter EXAM: PORTABLE CHEST 1 VIEW COMPARISON:  12/11/2019 FINDINGS: Cardiac shadow is within normal limits. Postsurgical changes are noted. The lungs are well aerated bilaterally. No focal infiltrate or effusion is seen. No bony abnormality is noted. IMPRESSION: No acute abnormality seen. Electronically Signed   By: Oneil Devonshire M.D.   On: 05/19/2023 20:30   CT HEAD WO CONTRAST Result Date: 05/19/2023 CLINICAL DATA:  Altered mental status.  Fall.  History of dementia. EXAM: CT HEAD WITHOUT CONTRAST CT CERVICAL SPINE WITHOUT CONTRAST TECHNIQUE: Multidetector CT imaging of the head and cervical spine was performed following the standard protocol without intravenous contrast. Multiplanar CT image reconstructions of the cervical spine were also generated. RADIATION  DOSE REDUCTION: This exam was performed according to the departmental dose-optimization program which includes automated exposure control, adjustment of the mA and/or kV according to patient size and/or use of iterative reconstruction technique. COMPARISON:  CT head and cervical spine 12/11/2019 FINDINGS: CT HEAD FINDINGS Brain: There is no evidence of an acute large territory infarct, intracranial hemorrhage, mass, midline shift, or extra-axial fluid collection. A small chronic right occipital infarct is new. Small chronic left cerebellar infarcts are also present. There is moderate cerebral atrophy. Periventricular white matter hypodensities are nonspecific but compatible with mild chronic small vessel ischemic disease. Vascular: Calcified atherosclerosis at the skull base. No hyperdense vessel. Skull: No acute fracture or suspicious osseous lesion. Sinuses/Orbits: Visualized  paranasal sinuses and mastoid air cells are clear. Bilateral cataract extraction. Other: None. CT CERVICAL SPINE FINDINGS Alignment: Mild chronic reversal of the normal cervical lordosis with grade 1 anterolisthesis of C3 on C4 and C4 on C5, unchanged. Skull base and vertebrae: No acute fracture or suspicious osseous lesion. Unchanged well corticated ossicle at the tip of the C7 spinous process. Soft tissues and spinal canal: No prevertebral fluid or swelling. No visible canal hematoma. Disc levels: Mild disc degeneration. Advanced upper cervical facet arthrosis. Asymmetrically advanced right neural foraminal stenosis at C3-4. Upper chest: Clear lung apices. Other: Heavily calcified atherosclerotic plaque at the carotid bifurcations. IMPRESSION: 1. No evidence of acute intracranial abnormality or acute cervical spine fracture. 2. 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 Electronically Signed   By: Dasie Hamburg M.D.   On: 05/19/2023 20:10   CT CERVICAL SPINE WO CONTRAST Result Date: 05/19/2023 CLINICAL DATA:  Altered mental status.  Fall.  History of dementia. EXAM: CT HEAD WITHOUT CONTRAST CT CERVICAL SPINE WITHOUT CONTRAST TECHNIQUE: Multidetector CT imaging of the head and cervical spine was performed following the standard protocol without intravenous contrast. Multiplanar CT image reconstructions of the cervical spine were also generated. RADIATION DOSE REDUCTION: This exam was performed according to the departmental dose-optimization program which includes automated exposure control, adjustment of the mA and/or kV according to patient size and/or use of iterative reconstruction technique. COMPARISON:  CT head and cervical spine 12/11/2019 FINDINGS: CT HEAD FINDINGS Brain: There is no evidence of an acute large territory infarct, intracranial hemorrhage, mass, midline shift, or extra-axial fluid collection. A small chronic right occipital infarct is new. Small chronic left cerebellar  infarcts are also present. There is moderate cerebral atrophy. Periventricular white matter hypodensities are nonspecific but compatible with mild chronic small vessel ischemic disease. Vascular: Calcified atherosclerosis at the skull base. No hyperdense vessel. Skull: No acute fracture or suspicious osseous lesion. Sinuses/Orbits: Visualized paranasal sinuses and mastoid air cells are clear. Bilateral cataract extraction. Other: None. CT CERVICAL SPINE FINDINGS Alignment: Mild chronic reversal of the normal cervical lordosis with grade 1 anterolisthesis of C3 on C4 and C4 on C5, unchanged. Skull base and vertebrae: No acute fracture or suspicious osseous lesion. Unchanged well corticated ossicle at the tip of the C7 spinous process. Soft tissues and spinal canal: No prevertebral fluid or swelling. No visible canal hematoma. Disc levels: Mild disc degeneration. Advanced upper cervical facet arthrosis. Asymmetrically advanced right neural foraminal stenosis at C3-4. Upper chest: Clear lung apices. Other: Heavily calcified atherosclerotic plaque at the carotid bifurcations. IMPRESSION: 1. No evidence of acute intracranial abnormality or acute cervical spine fracture. 2. 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 Electronically Signed   By: Dasie Hamburg M.D.   On: 05/19/2023 20:10  LOS: 1 day   Teejay Meader  [facility] Pager on [email or web address]  05/20/2023, 9:51 AM

## 2023-05-21 DIAGNOSIS — E538 Deficiency of other specified B group vitamins: Secondary | ICD-10-CM | POA: Diagnosis not present

## 2023-05-21 DIAGNOSIS — G9341 Metabolic encephalopathy: Secondary | ICD-10-CM | POA: Diagnosis not present

## 2023-05-21 DIAGNOSIS — N3000 Acute cystitis without hematuria: Secondary | ICD-10-CM | POA: Diagnosis not present

## 2023-05-21 LAB — BASIC METABOLIC PANEL
Anion gap: 5 (ref 5–15)
BUN: 23 mg/dL (ref 8–23)
CO2: 21 mmol/L — ABNORMAL LOW (ref 22–32)
Calcium: 8.2 mg/dL — ABNORMAL LOW (ref 8.9–10.3)
Chloride: 115 mmol/L — ABNORMAL HIGH (ref 98–111)
Creatinine, Ser: 0.96 mg/dL (ref 0.44–1.00)
GFR, Estimated: 56 mL/min — ABNORMAL LOW (ref >60)
Glucose, Bld: 104 mg/dL — ABNORMAL HIGH (ref 70–99)
Potassium: 4.3 mmol/L (ref 3.5–5.1)
Sodium: 141 mmol/L (ref 135–145)

## 2023-05-21 LAB — RETICULOCYTES
Immature Retic Fract: 9.6 % (ref 2.3–15.9)
RBC.: 3.33 MIL/uL — ABNORMAL LOW (ref 3.87–5.11)
Retic Count, Absolute: 45 10*3/uL (ref 19.0–186.0)
Retic Ct Pct: 1.4 % (ref 0.4–3.1)

## 2023-05-21 LAB — VITAMIN B12: Vitamin B-12: 148 pg/mL — ABNORMAL LOW (ref 180–914)

## 2023-05-21 LAB — CBC
HCT: 31.5 % — ABNORMAL LOW (ref 36.0–46.0)
Hemoglobin: 9.8 g/dL — ABNORMAL LOW (ref 12.0–15.0)
MCH: 28.6 pg (ref 26.0–34.0)
MCHC: 31.1 g/dL (ref 30.0–36.0)
MCV: 91.8 fL (ref 80.0–100.0)
Platelets: 186 10*3/uL (ref 150–400)
RBC: 3.43 MIL/uL — ABNORMAL LOW (ref 3.87–5.11)
RDW: 13 % (ref 11.5–15.5)
WBC: 7.8 10*3/uL (ref 4.0–10.5)
nRBC: 0 % (ref 0.0–0.2)

## 2023-05-21 LAB — IRON AND TIBC
Iron: 25 ug/dL — ABNORMAL LOW (ref 28–170)
Saturation Ratios: 11 % (ref 10.4–31.8)
TIBC: 225 ug/dL — ABNORMAL LOW (ref 250–450)
UIBC: 200 ug/dL

## 2023-05-21 LAB — TSH: TSH: 2.107 u[IU]/mL (ref 0.350–4.500)

## 2023-05-21 LAB — FERRITIN: Ferritin: 123 ng/mL (ref 11–307)

## 2023-05-21 LAB — FOLATE: Folate: 20.6 ng/mL (ref >5.9)

## 2023-05-21 MED ORDER — CYANOCOBALAMIN 1000 MCG/ML IJ SOLN
1000.0000 ug | Freq: Every day | INTRAMUSCULAR | Status: AC
  Administered 2023-05-21 – 2023-05-25 (×5): 1000 ug via INTRAMUSCULAR
  Filled 2023-05-21 (×5): qty 1

## 2023-05-21 MED ORDER — GERHARDT'S BUTT CREAM
TOPICAL_CREAM | Freq: Two times a day (BID) | CUTANEOUS | Status: DC
  Administered 2023-05-22: 1 via TOPICAL
  Filled 2023-05-21: qty 60

## 2023-05-21 MED ORDER — VITAMIN B-12 1000 MCG PO TABS: 1000.0000 ug | ORAL_TABLET | Freq: Every day | ORAL | Status: DC

## 2023-05-21 NOTE — Plan of Care (Signed)
   Problem: Activity: Goal: Risk for activity intolerance will decrease Outcome: Progressing   Problem: Nutrition: Goal: Adequate nutrition will be maintained Outcome: Progressing   Problem: Coping: Goal: Level of anxiety will decrease Outcome: Progressing

## 2023-05-21 NOTE — Evaluation (Signed)
 Physical Therapy Evaluation Patient Details Name: Tammy [name] MRN: [id number] DOB: 01/18/1931 Today's Date: 05/21/2023  History of Present Illness  Pt is a 89 y.o. female presenting 05/19/2023 after being found by daughter with decreased responsiveness and AMS. Admitted with acute metabolic encephalopathy in the setting of severe sepsis due to acute cystitis. PMH significant for dementia, paroxysmal a-fib, HTN HLD, CAD s/p CABO 1994, chronic diastolic heart failure.  Clinical Impression  Pt admitted with above diagnosis.  Pt currently with functional limitations due to the deficits listed below (see PT Problem List). Pt will benefit from acute skilled PT to increase their independence and safety with mobility to allow discharge.  Pt only oriented to self but agreeable for OOB today. Pt assisted to recliner with nurse assisting (performed pericare).  Pt apparently from home alone with daughter checking in per notes.  Pt does not appear safe to d/c home alone at this time.  Patient will benefit from continued inpatient follow up therapy, <3 hours/day.         If plan is discharge home, recommend the following: A little help with walking and/or transfers;A little help with bathing/dressing/bathroom;Help with stairs or ramp for entrance;Assistance with cooking/housework;Assist for transportation   Can travel by private vehicle   Yes    Equipment Recommendations Rolling walker (2 wheels)  Recommendations for Other Services       Functional Status Assessment Patient has had a recent decline in their functional status and demonstrates the ability to make significant improvements in function in a reasonable and predictable amount of time.     Precautions / Restrictions Precautions Precautions: Fall;Other (comment) Precaution Comments: delirium Restrictions Weight Bearing Restrictions Per Provider Order: No      Mobility  Bed Mobility Overal bed mobility: Needs Assistance Bed Mobility:  Supine to Sit     Supine to sit: Min assist     General bed mobility comments: light assist for trunk upright and scooting to EOB, required multimodal cues (likely due to hearing impairment)    Transfers Overall transfer level: Needs assistance Equipment used: Rolling walker (2 wheels) Transfers: Sit to/from Stand, Bed to chair/wheelchair/BSC Sit to Stand: Min assist, +2 safety/equipment   Step pivot transfers: Min assist, Mod assist       General transfer comment: assist to rise and steady; RN present and provided pericare and changed meplix dressing, multimodal cues for technique and safety    Ambulation/Gait                  Stairs            Wheelchair Mobility     Tilt Bed    Modified Rankin (Stroke Patients Only)       Balance Overall balance assessment: Needs assistance         Standing balance support: Bilateral upper extremity supported, During functional activity Standing balance-Leahy Scale: Poor                               Pertinent Vitals/Pain Pain Assessment Pain Assessment: Faces Faces Pain Scale: No hurt Pain Intervention(s): Monitored during session, Repositioned    Home Living Family/patient expects to be discharged to:: Skilled nursing facility (from home alone, but daughter requesting rehab at end of OT session) Living Arrangements: Alone Available Help at Discharge: Family;Available 24 hours/day (daughter brings all of pt's meals)             Home Equipment: Rollator (  4 wheels) Additional Comments: reports she has a big house and lives home alone, per RN - daughter checks on her, per notes - pt also has an aide    Prior Function Prior Level of Function : Needs assist;Patient poor historian/Family not available             Mobility Comments: reports limited ambulation prior to admission ADLs Comments: Pt reports she does not use a RW or rollator at baseline. Poor historian. Per chart, has an aide  but daughter present in room at end of session reports someone only cleans 1-2 times/week and otherwise daughter cooks for the pt. Pt normally able to perofrm ADL at home. wears pull ups at home     Extremity/Trunk Assessment   Upper Extremity Assessment Upper Extremity Assessment: Generalized weakness    Lower Extremity Assessment Lower Extremity Assessment: Generalized weakness    Cervical / Trunk Assessment Cervical / Trunk Assessment: Kyphotic  Communication   Communication Communication: Hearing impairment  Cognition Arousal: Alert Behavior During Therapy: WFL for tasks assessed/performed Overall Cognitive Status: No family/caregiver present to determine baseline cognitive functioning                                 General Comments: hx of dementia, only oriented to self today, uncertain of baseline        General Comments General comments (skin integrity, edema, etc.): VSS    Exercises     Assessment/Plan    PT Assessment Patient needs continued PT services  PT Problem List Decreased mobility;Decreased balance;Decreased activity tolerance;Decreased strength;Decreased knowledge of use of DME       PT Treatment Interventions DME instruction;Gait training;Balance training;Therapeutic activities;Therapeutic exercise;Functional mobility training;Stair training;Patient/family education    PT Goals (Current goals can be found in the Care Plan section)  Acute Rehab PT Goals PT Goal Formulation: Patient unable to participate in goal setting Time For Goal Achievement: 06/04/23 Potential to Achieve Goals: Good    Frequency Min 1X/week     Co-evaluation               AM-PAC PT 6 Clicks Mobility  Outcome Measure Help needed turning from your back to your side while in a flat bed without using bedrails?: A Little Help needed moving from lying on your back to sitting on the side of a flat bed without using bedrails?: A Little Help needed moving  to and from a bed to a chair (including a wheelchair)?: A Lot Help needed standing up from a chair using your arms (e.g., wheelchair or bedside chair)?: A Lot Help needed to walk in hospital room?: A Lot Help needed climbing 3-5 steps with a railing? : Total 6 Click Score: 13    End of Session Equipment Utilized During Treatment: Gait belt Activity Tolerance: Patient tolerated treatment well Patient left: in chair;with call bell/phone within reach;with chair alarm set;with nursing/sitter in room Nurse Communication: Mobility status PT Visit Diagnosis: Difficulty in walking, not elsewhere classified (R26.2);Muscle weakness (generalized) (M62.81)    Time: 8940-8888 PT Time Calculation (min) (ACUTE ONLY): 12 min   Charges:   PT Evaluation $PT Eval Low Complexity: 1 Low   PT General Charges $$ ACUTE PT VISIT: 1 Visit       Tari PT, DPT Physical Therapist Acute Rehabilitation Services Office: [phone]   Kati L Payson 05/21/2023, 1:27 PM

## 2023-05-21 NOTE — Evaluation (Signed)
 Occupational Therapy Evaluation Patient Details Name: Tammy [name] MRN: [id number] DOB: [birth year] Today's Date: 05/21/2023   History of Present Illness Pt is a 89 y.o. female presenting 05/19/2023 after being found by daughter with decreased responsiveness and AMS. Admitted with acute metabolic encephalopathy in the setting of severe sepsis due to acute cystitis. PMH significant for dementia, paroxysmal a-fib, HTN HLD, CAD s/p CABO 1994, chronic diastolic heart failure.   Clinical Impression   PTA, per daughter pt lived alone and was mod I for basic self-care tasks; daughter provided all meals and pt has assist with cleaning as well. Pt poor historian at baseline per daughter but more confusion and weakness at this time than baseline per daughter. Upon eval, pt with urinary incontinence with all transfers and needing min A for transfers and functional mobility as well as min-mod A for LB ADL. Patient will benefit from continued inpatient follow up therapy, <3 hours/day; daughter agreeable as pt not at baseline.       If plan is discharge home, recommend the following: A little help with walking and/or transfers;A little help with bathing/dressing/bathroom;A lot of help with bathing/dressing/bathroom;Assistance with cooking/housework;Help with stairs or ramp for entrance;Assist for transportation;Direct supervision/assist for medications management;Direct supervision/assist for financial management;Assistance with feeding    Functional Status Assessment  Patient has had a recent decline in their functional status and demonstrates the ability to make significant improvements in function in a reasonable and predictable amount of time.  Equipment Recommendations  Other (comment) (defer next venue)    Recommendations for Other Services       Precautions / Restrictions Precautions Precautions: Fall;Other (comment) Precaution Comments: delirium Restrictions Weight Bearing Restrictions Per  Provider Order: No      Mobility Bed Mobility               General bed mobility comments: OOB in chair    Transfers Overall transfer level: Needs assistance Equipment used: None Transfers: Sit to/from Stand Sit to Stand: Min assist           General transfer comment: Min A for rise and steady      Balance Overall balance assessment: Needs assistance Sitting-balance support: No upper extremity supported, Feet supported Sitting balance-Leahy Scale: Fair Sitting balance - Comments: able to reach down and wash lower legs   Standing balance support: No upper extremity supported Standing balance-Leahy Scale: Poor Standing balance comment: furniture surfs and needs up to min A. refused RW                           ADL either performed or assessed with clinical judgement   ADL Overall ADL's : Needs assistance/impaired Eating/Feeding: Set up;Cueing for sequencing   Grooming: Set up;Sitting;Wash/dry hands   Upper Body Bathing: Set up;Sitting   Lower Body Bathing: Minimal assistance;Sit to/from stand   Upper Body Dressing : Set up;Sitting   Lower Body Dressing: Moderate assistance;Sit to/from stand   Toilet Transfer: Minimal assistance;Ambulation Toilet Transfer Details (indicate cue type and reason): pt refused to use RW Toileting- Clothing Manipulation and Hygiene: Contact guard assist;Sitting/lateral lean       Functional mobility during ADLs: Contact guard assist;Minimal assistance       Vision   Additional Comments: not assessed. pt unable to report. Pt able to identify obstaceles in her room     Perception         Praxis         Pertinent Vitals/Pain  Extremity/Trunk Assessment Upper Extremity Assessment Upper Extremity Assessment: Generalized weakness   Lower Extremity Assessment Lower Extremity Assessment: Generalized weakness   Cervical / Trunk Assessment Cervical / Trunk Assessment: Kyphotic   Communication  Communication Communication: Hearing impairment   Cognition Arousal: Alert Behavior During Therapy: WFL for tasks assessed/performed Overall Cognitive Status: History of cognitive impairments - at baseline                                 General Comments: history of dementia, only oriented to self and her own birth date not year this session.     General Comments  VSS    Exercises     Shoulder Instructions      Home Living Family/patient expects to be discharged to:: Skilled nursing facility (from home alone, but daughter requesting rehab at end of OT session) Living Arrangements: Alone Available Help at Discharge: Family;Available 24 hours/day (daughter brings all of pt's meals)                         Home Equipment: Rollator (4 wheels)   Additional Comments: reports she has a big house and lives home alone, per RN - daughter checks on her, per notes - pt also has an aide      Prior Functioning/Environment Prior Level of Function : Needs assist;Patient poor historian/Family not available             Mobility Comments: reports limited ambulation prior to admission ADLs Comments: Pt reports she does not use a RW or rollator at baseline. Poor historian. Per chart, has an aide but daughter present in room at end of session reports someone only cleans 1-2 times/week and otherwise daughter cooks for the pt. Pt normally able to perofrm ADL at home. wears pull ups at home        OT Problem List: Decreased strength;Decreased activity tolerance;Impaired balance (sitting and/or standing);Decreased cognition;Decreased safety awareness;Decreased knowledge of use of DME or AE      OT Treatment/Interventions: Self-care/ADL training;Therapeutic exercise;DME and/or AE instruction;Balance training;Patient/family education;Therapeutic activities;Cognitive remediation/compensation    OT Goals(Current goals can be found in the care plan section) Acute Rehab OT  Goals Patient Stated Goal: per daughter, go to rehab OT Goal Formulation: With patient Time For Goal Achievement: 06/04/23 Potential to Achieve Goals: Good  OT Frequency: Min 1X/week    Co-evaluation              AM-PAC OT 6 Clicks Daily Activity     Outcome Measure Help from another person eating meals?: A Little Help from another person taking care of personal grooming?: A Little Help from another person toileting, which includes using toliet, bedpan, or urinal?: A Little Help from another person bathing (including washing, rinsing, drying)?: A Little Help from another person to put on and taking off regular upper body clothing?: A Little Help from another person to put on and taking off regular lower body clothing?: A Lot 6 Click Score: 17   End of Session Equipment Utilized During Treatment: Gait belt Nurse Communication: Mobility status  Activity Tolerance: Patient tolerated treatment well Patient left: in chair;with call bell/phone within reach;with chair alarm set;with family/visitor present (daughter in room providing set-up for eating)  OT Visit Diagnosis: Unsteadiness on feet (R26.81);Muscle weakness (generalized) (M62.81);Other symptoms and signs involving cognitive function;History of falling (Z91.81)  Time: 8846-8784 OT Time Calculation (min): 22 min Charges:  OT General Charges $OT Visit: 1 Visit OT Evaluation $OT Eval Low Complexity: 1 Low  Elma JONETTA Lebron FREDERICK, OTR/L [facility] Acute Rehabilitation Office: [phone]   Elma JONETTA Lebron 05/21/2023, 12:40 PM

## 2023-05-21 NOTE — Progress Notes (Signed)
 [facility] PROGRESS NOTE   Tammy [name] FMW:[id number] DOB: 01-23-1931 DOA: 05/19/2023  PCP: Okey Carlin Redbird, MD  Brief History: 89 y.o. female with medical history significant for dementia, mild intermittent asthma, paroxysmal atrial fibrillation, essential pretension, hyperlipidemia, coronary disease status post CABG in January 1994, chronic diastolic heart failure, who was admitted to [facility] on 05/19/2023 with acute metabolic encephalopathy in the setting of severe sepsis due to acute cystitis after presenting from home to [facility] ED for evaluation of altered mental status.   Consultants: None  Procedures: None    Subjective/Interval History: Patient is pleasantly confused.  Does not appear to be in any discomfort or distress.  Does not answer questions appropriately.  Assessment/Plan:  Acute metabolic encephalopathy This is in the setting of underlying dementia.  Encephalopathy thought to be due to urinary tract infection and sepsis.   Stable for the most part.  No significant improvement noted from yesterday.  Most of this is likely her dementia.    Urinary tract infection with severe sepsis Noted to be on ceftriaxone .  Follow-up on culture data. WBC was elevated at 13.9 at admission.  Subsequently normal.  Lactic acid level was 0.5.  Acute kidney injury Baseline creatinine is normal.  Presented with creatinine of 1.76.  Improved with IV hydration.  Creatinine is down to 0.96 today.  Okay to discontinue IV fluids.  Encourage oral intake.  Mechanical fall No obvious injuries identified on examination.  Imaging studies also did not show any injuries.  PT and OT evaluation.  History of mild intermittent asthma Stable.  Paroxysmal atrial fibrillation Not noted to be on anticoagulation.  Continue with metoprolol .  Essential hypertension Monitor blood pressures closely.  Furosemide  and losartan on hold due to AKI.  Occasional high readings noted.  Will not  be too aggressive with blood pressure management due to her advanced age and dementia.  Hyperlipidemia Statin.  Chronic diastolic CHF Stable.  Was hypovolemic.  Continue with IV fluids for another 24 hours.  Normocytic anemia/vitamin B12 deficiency Drop in hemoglobin is noted and likely dilutional.  No overt bleeding noted.  Hemoglobin is stable.  Anemia panel shows significant B12 deficiency at 148.  Folic acid  20.6.  Ferritin 123, iron 25, TIBC 225, percent saturation 11. Will start B12 supplementation with daily intramuscular injections for 5 days followed by oral supplementation.     DVT Prophylaxis: SCDs Code Status: Full code Family Communication: No family at bedside Disposition Plan: Hopefully return home when improved.  PT and OT eval is pending.  Status is: Inpatient Remains inpatient appropriate because: Acute metabolic encephalopathy, UTI requiring IV antibiotics      Medications: Scheduled:  atorvastatin   10 mg Oral Daily   [name]   Topical BID   metoprolol  succinate  50 mg Oral Daily   Continuous:  sodium chloride  Stopped (05/21/23 0921)   cefTRIAXone  (ROCEPHIN )  IV 1 g (05/20/23 1624)   PRN:acetaminophen  **OR** acetaminophen , albuterol , melatonin, ondansetron  (ZOFRAN ) IV  Antibiotics: Anti-infectives (From admission, onward)    Start     Dose/Rate Route Frequency Ordered Stop   05/20/23 1600  cefTRIAXone  (ROCEPHIN ) 1 g in sodium chloride  0.9 % 100 mL IVPB        1 g 200 mL/hr over 30 Minutes Intravenous Every 24 hours 05/19/23 2128     05/19/23 1900  cefTRIAXone  (ROCEPHIN ) 1 g in sodium chloride  0.9 % 100 mL IVPB        1 g 200 mL/hr over 30  Minutes Intravenous  Once 05/19/23 1857 05/19/23 2026       Objective:  Vital Signs  Vitals:   05/20/23 1751 05/20/23 2009 05/21/23 0500 05/21/23 0634  BP: (!) 140/74 (!) 123/100  (!) 156/52  Pulse: 99 81  71  Resp: 20 20    Temp: 98.7 F (37.1 C) 98.4 F (36.9 C)  98 F (36.7 C)   TempSrc: Oral Oral  Oral  SpO2: 92% 96%  96%  Weight:   58.1 kg   Height:        Intake/Output Summary (Last 24 hours) at 05/21/2023 0957 Last data filed at 05/21/2023 0630 Gross per 24 hour  Intake 2015.59 ml  Output 300 ml  Net 1715.59 ml   Filed Weights   05/20/23 1741 05/21/23 0500  Weight: 59.1 kg 58.1 kg    General appearance: Awake alert.  In no distress.  Distracted Resp: Clear to auscultation bilaterally.  Normal effort Cardio: S1-S2 is normal regular.  No S3-S4.  No rubs murmurs or bruit GI: Abdomen is soft.  Nontender nondistended.  Bowel sounds are present normal.  No masses organomegaly No obvious focal neurological deficits.   Lab Results:  Data Reviewed: I have personally reviewed following labs and reports of the imaging studies  CBC: Recent Labs  Lab 05/19/23 1826 05/20/23 0522 05/21/23 0630  WBC 13.9* 7.1 7.8  NEUTROABS 10.0* 4.3  --   HGB 11.9* 9.4* 9.8*  HCT 35.8* 29.6* 31.5*  MCV 88.8 91.4 91.8  PLT 254 173 186    Basic Metabolic Panel: Recent Labs  Lab 05/19/23 1826 05/20/23 0522 05/21/23 0630  NA 137 142 141  K 4.7 4.4 4.3  CL 106 116* 115*  CO2 21* 20* 21*  GLUCOSE 122* 96 104*  BUN 39* 28* 23  CREATININE 1.76* 1.13* 0.96  CALCIUM  8.9 7.8* 8.2*  MG 2.3 1.9  --     GFR: Estimated Creatinine Clearance: 31.9 mL/min (by C-G formula based on SCr of 0.96 mg/dL).  Liver Function Tests: Recent Labs  Lab 05/19/23 1826 05/20/23 0522  AST 20 16  ALT 12 11  ALKPHOS 69 52  BILITOT 1.0 0.4  PROT 7.8 5.7*  ALBUMIN 3.8 2.7*    Cardiac Enzymes: Recent Labs  Lab 05/19/23 1826  CKTOTAL 224   CBG: Recent Labs  Lab 05/19/23 1823  GLUCAP 107*     Radiology Studies: DG Forearm Left Result Date: 05/19/2023 CLINICAL DATA:  Recent fall with forearm pain, initial encounter EXAM: LEFT FOREARM - 2 VIEW COMPARISON:  03/26/2012 FINDINGS: Old healed fracture of the distal radial metaphysis is noted. Nonunion of a an ulnar styloid  fracture is noted as well. No acute fracture or dislocation is noted. Degenerative changes are noted in the carpal bones as well as the first [facility] joint. IMPRESSION: Healed fractures and degenerative change without acute abnormality. Electronically Signed   By: Oneil Devonshire M.D.   On: 05/19/2023 20:39   DG Pelvis Portable Result Date: 05/19/2023 CLINICAL DATA:  History of recent falls, initial encounter EXAM: PORTABLE PELVIS 1 VIEWS COMPARISON:  None Available. FINDINGS: Pelvic ring appears intact. Healed fracture is noted in the pubic symphysis on the left stable from prior CT of 2022. No acute fracture or dislocation is noted. No soft tissue abnormality is seen. IMPRESSION: No acute abnormality noted. Electronically Signed   By: Oneil Devonshire M.D.   On: 05/19/2023 20:32   DG Chest Port 1 View Result Date: 05/19/2023 CLINICAL DATA:  Weakness and history of  recent falls, initial encounter EXAM: PORTABLE CHEST 1 VIEW COMPARISON:  12/11/2019 FINDINGS: Cardiac shadow is within normal limits. Postsurgical changes are noted. The lungs are well aerated bilaterally. No focal infiltrate or effusion is seen. No bony abnormality is noted. IMPRESSION: No acute abnormality seen. Electronically Signed   By: Oneil Devonshire M.D.   On: 05/19/2023 20:30   CT HEAD WO CONTRAST Result Date: 05/19/2023 CLINICAL DATA:  Altered mental status.  Fall.  History of dementia. EXAM: CT HEAD WITHOUT CONTRAST CT CERVICAL SPINE WITHOUT CONTRAST TECHNIQUE: Multidetector CT imaging of the head and cervical spine was performed following the standard protocol without intravenous contrast. Multiplanar CT image reconstructions of the cervical spine were also generated. RADIATION DOSE REDUCTION: This exam was performed according to the departmental dose-optimization program which includes automated exposure control, adjustment of the mA and/or kV according to patient size and/or use of iterative reconstruction technique. COMPARISON:  CT head and  cervical spine 12/11/2019 FINDINGS: CT HEAD FINDINGS Brain: There is no evidence of an acute large territory infarct, intracranial hemorrhage, mass, midline shift, or extra-axial fluid collection. A small chronic right occipital infarct is new. Small chronic left cerebellar infarcts are also present. There is moderate cerebral atrophy. Periventricular white matter hypodensities are nonspecific but compatible with mild chronic small vessel ischemic disease. Vascular: Calcified atherosclerosis at the skull base. No hyperdense vessel. Skull: No acute fracture or suspicious osseous lesion. Sinuses/Orbits: Visualized paranasal sinuses and mastoid air cells are clear. Bilateral cataract extraction. Other: None. CT CERVICAL SPINE FINDINGS Alignment: Mild chronic reversal of the normal cervical lordosis with grade 1 anterolisthesis of C3 on C4 and C4 on C5, unchanged. Skull base and vertebrae: No acute fracture or suspicious osseous lesion. Unchanged well corticated ossicle at the tip of the C7 spinous process. Soft tissues and spinal canal: No prevertebral fluid or swelling. No visible canal hematoma. Disc levels: Mild disc degeneration. Advanced upper cervical facet arthrosis. Asymmetrically advanced right neural foraminal stenosis at C3-4. Upper chest: Clear lung apices. Other: Heavily calcified atherosclerotic plaque at the carotid bifurcations. IMPRESSION: 1. No evidence of acute intracranial abnormality or acute cervical spine fracture. 2. 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 Electronically Signed   By: Dasie Hamburg M.D.   On: 05/19/2023 20:10   CT CERVICAL SPINE WO CONTRAST Result Date: 05/19/2023 CLINICAL DATA:  Altered mental status.  Fall.  History of dementia. EXAM: CT HEAD WITHOUT CONTRAST CT CERVICAL SPINE WITHOUT CONTRAST TECHNIQUE: Multidetector CT imaging of the head and cervical spine was performed following the standard protocol without intravenous contrast. Multiplanar CT image  reconstructions of the cervical spine were also generated. RADIATION DOSE REDUCTION: This exam was performed according to the departmental dose-optimization program which includes automated exposure control, adjustment of the mA and/or kV according to patient size and/or use of iterative reconstruction technique. COMPARISON:  CT head and cervical spine 12/11/2019 FINDINGS: CT HEAD FINDINGS Brain: There is no evidence of an acute large territory infarct, intracranial hemorrhage, mass, midline shift, or extra-axial fluid collection. A small chronic right occipital infarct is new. Small chronic left cerebellar infarcts are also present. There is moderate cerebral atrophy. Periventricular white matter hypodensities are nonspecific but compatible with mild chronic small vessel ischemic disease. Vascular: Calcified atherosclerosis at the skull base. No hyperdense vessel. Skull: No acute fracture or suspicious osseous lesion. Sinuses/Orbits: Visualized paranasal sinuses and mastoid air cells are clear. Bilateral cataract extraction. Other: None. CT CERVICAL SPINE FINDINGS Alignment: Mild chronic reversal of the normal cervical lordosis with  grade 1 anterolisthesis of C3 on C4 and C4 on C5, unchanged. Skull base and vertebrae: No acute fracture or suspicious osseous lesion. Unchanged well corticated ossicle at the tip of the C7 spinous process. Soft tissues and spinal canal: No prevertebral fluid or swelling. No visible canal hematoma. Disc levels: Mild disc degeneration. Advanced upper cervical facet arthrosis. Asymmetrically advanced right neural foraminal stenosis at C3-4. Upper chest: Clear lung apices. Other: Heavily calcified atherosclerotic plaque at the carotid bifurcations. IMPRESSION: 1. No evidence of acute intracranial abnormality or acute cervical spine fracture. 2. 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 Electronically Signed   By: Dasie Hamburg M.D.   On: 05/19/2023 20:10       LOS: 2  days   Carrigan Delafuente Verdene  [facility] Pager on [email or web address]  05/21/2023, 9:57 AM

## 2023-05-22 DIAGNOSIS — E538 Deficiency of other specified B group vitamins: Secondary | ICD-10-CM | POA: Diagnosis not present

## 2023-05-22 DIAGNOSIS — G9341 Metabolic encephalopathy: Secondary | ICD-10-CM | POA: Diagnosis not present

## 2023-05-22 DIAGNOSIS — N3000 Acute cystitis without hematuria: Secondary | ICD-10-CM | POA: Diagnosis not present

## 2023-05-22 LAB — BASIC METABOLIC PANEL
Anion gap: 7 (ref 5–15)
BUN: 18 mg/dL (ref 8–23)
CO2: 22 mmol/L (ref 22–32)
Calcium: 9 mg/dL (ref 8.9–10.3)
Chloride: 112 mmol/L — ABNORMAL HIGH (ref 98–111)
Creatinine, Ser: 0.86 mg/dL (ref 0.44–1.00)
GFR, Estimated: >60 mL/min (ref >60)
Glucose, Bld: 103 mg/dL — ABNORMAL HIGH (ref 70–99)
Potassium: 4.4 mmol/L (ref 3.5–5.1)
Sodium: 141 mmol/L (ref 135–145)

## 2023-05-22 LAB — CBC
HCT: 34 % — ABNORMAL LOW (ref 36.0–46.0)
Hemoglobin: 10.9 g/dL — ABNORMAL LOW (ref 12.0–15.0)
MCH: 28.7 pg (ref 26.0–34.0)
MCHC: 32.1 g/dL (ref 30.0–36.0)
MCV: 89.5 fL (ref 80.0–100.0)
Platelets: 217 10*3/uL (ref 150–400)
RBC: 3.8 MIL/uL — ABNORMAL LOW (ref 3.87–5.11)
RDW: 12.7 % (ref 11.5–15.5)
WBC: 8.2 10*3/uL (ref 4.0–10.5)
nRBC: 0 % (ref 0.0–0.2)

## 2023-05-22 MED ORDER — LABETALOL HCL 5 MG/ML IV SOLN
10.0000 mg | INTRAVENOUS | Status: DC | PRN
  Administered 2023-05-22: 10 mg via INTRAVENOUS
  Filled 2023-05-22: qty 4

## 2023-05-22 MED ORDER — IRBESARTAN 300 MG PO TABS
300.0000 mg | ORAL_TABLET | Freq: Every day | ORAL | Status: DC
  Administered 2023-05-23 – 2023-05-24 (×2): 300 mg via ORAL
  Filled 2023-05-22 (×3): qty 1

## 2023-05-22 MED ORDER — IRBESARTAN 300 MG PO TABS: 300.0000 mg | ORAL_TABLET | Freq: Every day | ORAL | Status: DC

## 2023-05-22 NOTE — NC FL2 (Signed)
 [place]  MEDICAID FL2 LEVEL OF CARE FORM     IDENTIFICATION  Patient Name: Tammy [name] Birthdate: [birth year] Sex: female Admission Date (Current Location): 05/19/2023  [facility] and [place] Number:  Producer, Television/film/video and Address:  [facility],  [address] [place], [place] [address]      Provider Number: [id number]  Attending Physician Name and Address:  Verdene Purchase, MD  Relative Name and Phone Number:  Marval Sarah (dtr) [phone]    Current Level of Care: Hospital Recommended Level of Care: Skilled Nursing Facility Prior Approval Number:    Date Approved/Denied:   PASRR Number: [id number]  Discharge Plan: SNF    Current Diagnoses: Patient Active Problem List   Diagnosis Date Noted   Acute metabolic encephalopathy 05/19/2023   Severe sepsis (HCC) 05/19/2023   Acute cystitis 05/19/2023   Acute prerenal azotemia 05/19/2023   Mild intermittent asthma 05/19/2023   GERD (gastroesophageal reflux disease)    Delirium 12/18/2019   Chronic diastolic CHF (congestive heart failure) (HCC) 12/18/2019   History of rhabdomyolysis 12/18/2019   Recurrent falls 12/18/2019   Malnutrition of moderate degree 12/14/2019   Elevated troponin 12/11/2019   Essential tremor 03/16/2018   Dementia without behavioral disturbance (HCC) 11/08/2017   Gait difficulty 11/08/2017   Left groin pain 12/09/2016   Acute respiratory failure with hypoxia (HCC) 12/08/2016   Closed fracture of right distal radius 03/16/2016   Hypothermia 02/20/2016   SIRS (systemic inflammatory response syndrome) (HCC) 02/19/2016   Diverticulosis    Encephalopathy    Hyponatremia 02/14/2016   Acute encephalopathy 02/14/2016   Elevated lactic acid level 02/14/2016   Dizziness 02/14/2016   Abdominal pain 02/14/2016   Symptomatic bradycardia 02/12/2016   Leukocytosis 02/12/2016   Depression 02/12/2016   Renal insufficiency    SOB (shortness of breath)    Bradycardia on ECG 02/11/2016    Atrial fibrillation with RVR (HCC) 01/01/2016   Paroxysmal atrial fibrillation (HCC) 01/01/2016   Alcohol dependence with withdrawal with complication (HCC) 01/01/2016   Fall at home, initial encounter 01/01/2016   AKI (acute kidney injury) (HCC)    Dehydration    Exertional dyspnea 09/11/2015   Chronic diarrhea 08/07/2015   Defecation urgency 08/07/2015   Family history of colon cancer 08/07/2015   Fecal incontinence 08/07/2015   Memory loss 11/13/2013   Abnormal involuntary movement 11/13/2013   Pulmonary hypertension (HCC) 11/02/2013   Generalized weakness 11/02/2013   Coronary artery disease involving native coronary artery of native heart with angina pectoris (HCC) 11/16/2012   Mitral regurgitation 11/16/2012   Renal artery stenosis (HCC) 11/16/2012   HLD (hyperlipidemia) 11/16/2012   Essential hypertension 11/16/2012    Orientation RESPIRATION BLADDER Height & Weight     Self  Normal External catheter Weight: 59 kg Height:  5' 1 (154.9 cm)  BEHAVIORAL SYMPTOMS/MOOD NEUROLOGICAL BOWEL NUTRITION STATUS      Incontinent Diet (Dysphagia 3, thin liquids)  AMBULATORY STATUS COMMUNICATION OF NEEDS Skin   Limited Assist Verbally Other (Comment), Bruising (ecchymosis to bil arms; erythema to bil groin/buttocks; genital erythema)                       Personal Care Assistance Level of Assistance  Bathing, Feeding, Dressing Bathing Assistance: Limited assistance Feeding assistance: Independent Dressing Assistance: Limited assistance     Functional Limitations Info  Sight, Hearing, Speech Sight Info: Impaired Hearing Info: Impaired Speech Info: Adequate    SPECIAL CARE FACTORS FREQUENCY  PT (By licensed PT), OT (By  licensed OT)     PT Frequency: 5x/week OT Frequency: 5x/week            Contractures Contractures Info: Not present    Additional Factors Info  Code Status, Allergies Code Status Info: DNR Allergies Info: Penicillins, Naprosyn (Naproxen),  Sertraline, Lactose Intolerance (Gi)           Current Medications (05/22/2023):  This is the current hospital active medication list Current Facility-Administered Medications  Medication Dose Route Frequency Provider Last Rate Last Admin   acetaminophen  (TYLENOL ) tablet 650 mg  650 mg Oral Q6H PRN Howerter, Justin B, DO   650 mg at 05/21/23 1821   Or   acetaminophen  (TYLENOL ) suppository 650 mg  650 mg Rectal Q6H PRN Howerter, Justin B, DO       albuterol  (PROVENTIL ) (2.5 MG/3ML) 0.083% nebulizer solution 2.5 mg  2.5 mg Nebulization Q4H PRN Howerter, Justin B, DO       atorvastatin  (LIPITOR) tablet 10 mg  10 mg Oral Daily Howerter, Justin B, DO   10 mg at 05/22/23 0911   cefTRIAXone  (ROCEPHIN ) 1 g in sodium chloride  0.9 % 100 mL IVPB  1 g Intravenous Q24H Howerter, Justin B, DO 200 mL/hr at 05/22/23 1555 1 g at 05/22/23 1555   cyanocobalamin  (VITAMIN B12) injection 1,000 mcg  1,000 mcg Intramuscular Daily Krishnan, Gokul, MD   1,000 mcg at 05/22/23 0926   Followed by   [name] ON 05/26/2023] cyanocobalamin  (VITAMIN B12) tablet 1,000 mcg  1,000 mcg Oral Daily Verdene Purchase, MD       [name]   Topical BID Chavez, Abigail, NP   1 Application at 05/22/23 1348   [START ON 05/23/2023] irbesartan  (AVAPRO ) tablet 300 mg  300 mg Oral Daily Krishnan, Gokul, MD       labetalol  (NORMODYNE ) injection 10 mg  10 mg Intravenous Q2H PRN Krishnan, Gokul, MD       melatonin tablet 3 mg  3 mg Oral QHS PRN Howerter, Justin B, DO   3 mg at 05/21/23 2123   metoprolol  succinate (TOPROL -XL) 24 hr tablet 50 mg  50 mg Oral Daily Howerter, Justin B, DO   50 mg at 05/22/23 0911   ondansetron  (ZOFRAN ) injection 4 mg  4 mg Intravenous Q6H PRN Howerter, Justin B, DO         Discharge Medications: Please see discharge summary for a list of discharge medications.  Relevant Imaging Results:  Relevant Lab Results:   Additional Information SSN [id number]  Sonda Manuella Quill, RN

## 2023-05-22 NOTE — Progress Notes (Addendum)
 [facility] PROGRESS NOTE   Tammy [name] FMW:[id number] DOB: 1930-09-23 DOA: 05/19/2023  PCP: Tammy Carlin Redbird, MD  Brief History: 89 y.o. female with medical history significant for dementia, mild intermittent asthma, paroxysmal atrial fibrillation, essential pretension, hyperlipidemia, coronary disease status post CABG in January 1994, chronic diastolic heart failure, who was admitted to [facility] on 05/19/2023 with acute metabolic encephalopathy in the setting of severe sepsis due to acute cystitis after presenting from home to [facility] ED for evaluation of altered mental status.   Consultants: None  Procedures: None    Subjective/Interval History: Patient noted to be pleasantly confused.  Unable to answer questions appropriately.    Assessment/Plan:  Acute metabolic encephalopathy This is in the setting of underlying dementia.  Encephalopathy thought to be due to urinary tract infection and sepsis.   Remains stable for the most part.  She is likely close to her baseline.  Based on discussions with her daughter it looks like her dementia has been progressing over the past year.   Urinary tract infection with severe sepsis Noted to be on ceftriaxone .  Urine culture is growing Klebsiella.  Await final report. WBC was elevated at 13.9 at admission.  Subsequently normal.  Lactic acid level was 0.5.  Acute kidney injury Baseline creatinine is normal.  Presented with creatinine of 1.76.  Improved with IV hydration.  Now back to normal. Encourage oral intake.  Mechanical fall No obvious injuries identified on examination.  Imaging studies also did not show any injuries.  PT and OT evaluation.  History of mild intermittent asthma Stable.  Paroxysmal atrial fibrillation Not noted to be on anticoagulation.  Continue with metoprolol . Noted to be tachycardic on auscultation this morning.  EKG has been ordered.  Essential hypertension Furosemide  and losartan on hold due  to AKI.   Labetalol  as needed.  Continue with Toprol -XL.   Should be able to resume her losartan as well.    Hyperlipidemia Statin.  Chronic diastolic CHF Stable.    Normocytic anemia/vitamin B12 deficiency Drop in hemoglobin is noted and likely dilutional.  No overt bleeding noted.  Hemoglobin is stable.  Anemia panel shows significant B12 deficiency at 148.  Folic acid  20.6.  Ferritin 123, iron 25, TIBC 225, percent saturation 11. She was started on B12 supplementation with daily intramuscular injections for 5 days followed by oral supplementation.     DVT Prophylaxis: SCDs Code Status: CODE STATUS was changed to DNR after discussions with daughter on 1/4. Family Communication: Daughter was updated yesterday. Disposition Plan: SNF is recommended.  Will involve TOC.  Status is: Inpatient Remains inpatient appropriate because: Acute metabolic encephalopathy, UTI requiring IV antibiotics      Medications: Scheduled:  atorvastatin   10 mg Oral Daily   cyanocobalamin   1,000 mcg Intramuscular Daily   Followed by   Tammy [name] ON 05/26/2023] vitamin B-12  1,000 mcg Oral Daily   [name]   Topical BID   metoprolol  succinate  50 mg Oral Daily   Continuous:  cefTRIAXone  (ROCEPHIN )  IV 1 g (05/21/23 1643)   PRN:acetaminophen  **OR** acetaminophen , albuterol , melatonin, ondansetron  (ZOFRAN ) IV  Antibiotics: Anti-infectives (From admission, onward)    Start     Dose/Rate Route Frequency Ordered Stop   05/20/23 1600  cefTRIAXone  (ROCEPHIN ) 1 g in sodium chloride  0.9 % 100 mL IVPB        1 g 200 mL/hr over 30 Minutes Intravenous Every 24 hours 05/19/23 2128     05/19/23 1900  cefTRIAXone  (ROCEPHIN )  1 g in sodium chloride  0.9 % 100 mL IVPB        1 g 200 mL/hr over 30 Minutes Intravenous  Once 05/19/23 1857 05/19/23 2026       Objective:  Vital Signs  Vitals:   05/22/23 0500 05/22/23 0538 05/22/23 0557 05/22/23 0911  BP:  (!) 165/97 (!) 158/82 (!) 180/77  Pulse:  68  70 74  Resp:  19    Temp:  98.1 F (36.7 C)    TempSrc:  Oral    SpO2:  99%    Weight: 59 kg     Height:        Intake/Output Summary (Last 24 hours) at 05/22/2023 0947 Last data filed at 05/22/2023 9380 Gross per 24 hour  Intake 60 ml  Output 950 ml  Net -890 ml   Filed Weights   05/20/23 1741 05/21/23 0500 05/22/23 0500  Weight: 59.1 kg 58.1 kg 59 kg    General appearance: Awake alert.  Pleasantly confused. Resp: Clear to auscultation bilaterally.  Normal effort Cardio: S1-S2 is tachycardic regular.  No S3-S4.  No rubs murmurs or bruit GI: Abdomen is soft.  Nontender nondistended.  Bowel sounds are present normal.  No masses organomegaly Extremities: No edema.  Moving all of her extremities.  Lab Results:  Data Reviewed: I have personally reviewed following labs and reports of the imaging studies  CBC: Recent Labs  Lab 05/19/23 1826 05/20/23 0522 05/21/23 0630 05/22/23 0552  WBC 13.9* 7.1 7.8 8.2  NEUTROABS 10.0* 4.3  --   --   HGB 11.9* 9.4* 9.8* 10.9*  HCT 35.8* 29.6* 31.5* 34.0*  MCV 88.8 91.4 91.8 89.5  PLT 254 173 186 217    Basic Metabolic Panel: Recent Labs  Lab 05/19/23 1826 05/20/23 0522 05/21/23 0630 05/22/23 0552  NA 137 142 141 141  K 4.7 4.4 4.3 4.4  CL 106 116* 115* 112*  CO2 21* 20* 21* 22  GLUCOSE 122* 96 104* 103*  BUN 39* 28* 23 18  CREATININE 1.76* 1.13* 0.96 0.86  CALCIUM  8.9 7.8* 8.2* 9.0  MG 2.3 1.9  --   --     GFR: Estimated Creatinine Clearance: 35.9 mL/min (by C-G formula based on SCr of 0.86 mg/dL).  Liver Function Tests: Recent Labs  Lab 05/19/23 1826 05/20/23 0522  AST 20 16  ALT 12 11  ALKPHOS 69 52  BILITOT 1.0 0.4  PROT 7.8 5.7*  ALBUMIN 3.8 2.7*    Cardiac Enzymes: Recent Labs  Lab 05/19/23 1826  CKTOTAL 224   CBG: Recent Labs  Lab 05/19/23 1823  GLUCAP 107*     Radiology Studies: No results found.      LOS: 3 days   Julyssa Kyer [employer] on  [email or web address]  05/22/2023, 9:47 AM

## 2023-05-22 NOTE — TOC Initial Note (Addendum)
 Transition of Care [facility]) - Initial/Assessment Note    Patient Details  Name: Tammy [name] MRN: [id number] Date of Birth: 1930/06/13  Transition of Care [facility]) CM/SW Contact:    Sonda Manuella Quill, RN Phone Number: 05/22/2023, 4:27 PM  Clinical Narrative:                 TOC for d/c planning; pt disoriented; called pt's dtr [name] [name] [phone]); she says pt normally lives at home; she verified PCP/insurance; she normally provides transportation; Ms [name] denies her mother experiencing SDOH risks;pher dtr says pt has glasses, dentures, and HAs that she does not wear, she says pt has cane; pt does not have HH services or home oxygen ; pt's dtr agrees to recc SNF; she says pt does not have issues w/ her skin; she does not have a facility preference; however she does not want the pt to d/c to [facility]; pt's dtr would like bed search to include neighboring counties; explained SNF process, and ins auth needed; she verbalized understanding; faxed out; waiting bed offers.  Expected Discharge Plan: Skilled Nursing Facility Barriers to Discharge: Continued Medical Work up   Patient Goals and CMS Choice Patient states their goals for this hospitalization and ongoing recovery are:: pt's dtr says she will d/c to SNF CMS Medicare.gov Compare Post Acute Care list provided to:: Patient Represenative (must comment) ([name] [name] (dtr))   [place] ownership interest in [facility].provided to:: Adult Children    Expected Discharge Plan and Services   Discharge Planning Services: CM Consult   Living arrangements for the past 2 months: Single Family Home                                      Prior Living Arrangements/Services Living arrangements for the past 2 months: Single Family Home Lives with:: Self Patient language and need for interpreter reviewed:: Yes Do you feel safe going back to the place where you live?: Yes      Need for Family Participation in  Patient Care: Yes (Comment) Care giver support system in place?: Yes (comment) Current home services: DME (cane) Criminal Activity/Legal Involvement Pertinent to Current Situation/Hospitalization: No - Comment as needed  Activities of Daily Living   ADL Screening (condition at time of admission) Independently performs ADLs?: No Does the patient have a NEW difficulty with bathing/dressing/toileting/self-feeding that is expected to last >3 days?: No Does the patient have a NEW difficulty with getting in/out of bed, walking, or climbing stairs that is expected to last >3 days?: No Does the patient have a NEW difficulty with communication that is expected to last >3 days?: No Is the patient deaf or have difficulty hearing?: Yes Does the patient have difficulty seeing, even when wearing glasses/contacts?: No Does the patient have difficulty concentrating, remembering, or making decisions?: Yes  Permission Sought/Granted Permission sought to share information with : Case Manager Permission granted to share information with : Yes, Verbal Permission Granted  Share Information with NAME: Case Manager     Permission granted to share info w Relationship: [name] [name] (dtr) [phone]     Emotional Assessment Appearance:: Other (Comment Required (unable to assess) Attitude/Demeanor/Rapport: Unable to Assess Affect (typically observed): Unable to Assess Orientation: :  (unable to assess) Alcohol / Substance Use: Not Applicable Psych Involvement: No (comment)  Admission diagnosis:  Confusion [R41.0] Acute cystitis without hematuria [N30.00] AKI (acute kidney injury) (HCC) [N17.9]  Fall, initial encounter [W19.XXXA] Acute metabolic encephalopathy [G93.41] Patient Active Problem List   Diagnosis Date Noted   Acute metabolic encephalopathy 05/19/2023   Severe sepsis (HCC) 05/19/2023   Acute cystitis 05/19/2023   Acute prerenal azotemia 05/19/2023   Mild intermittent asthma 05/19/2023    GERD (gastroesophageal reflux disease)    Delirium 12/18/2019   Chronic diastolic CHF (congestive heart failure) (HCC) 12/18/2019   History of rhabdomyolysis 12/18/2019   Recurrent falls 12/18/2019   Malnutrition of moderate degree 12/14/2019   Elevated troponin 12/11/2019   Essential tremor 03/16/2018   Dementia without behavioral disturbance (HCC) 11/08/2017   Gait difficulty 11/08/2017   Left groin pain 12/09/2016   Acute respiratory failure with hypoxia (HCC) 12/08/2016   Closed fracture of right distal radius 03/16/2016   Hypothermia 02/20/2016   SIRS (systemic inflammatory response syndrome) (HCC) 02/19/2016   Diverticulosis    Encephalopathy    Hyponatremia 02/14/2016   Acute encephalopathy 02/14/2016   Elevated lactic acid level 02/14/2016   Dizziness 02/14/2016   Abdominal pain 02/14/2016   Symptomatic bradycardia 02/12/2016   Leukocytosis 02/12/2016   Depression 02/12/2016   Renal insufficiency    SOB (shortness of breath)    Bradycardia on ECG 02/11/2016   Atrial fibrillation with RVR (HCC) 01/01/2016   Paroxysmal atrial fibrillation (HCC) 01/01/2016   Alcohol dependence with withdrawal with complication (HCC) 01/01/2016   Fall at home, initial encounter 01/01/2016   AKI (acute kidney injury) [facility])    Dehydration    Exertional dyspnea 09/11/2015   Chronic diarrhea 08/07/2015   Defecation urgency 08/07/2015   Family history of colon cancer 08/07/2015   Fecal incontinence 08/07/2015   Memory loss 11/13/2013   Abnormal involuntary movement 11/13/2013   Pulmonary hypertension (HCC) 11/02/2013   Generalized weakness 11/02/2013   Coronary artery disease involving native coronary artery of native heart with angina pectoris (HCC) 11/16/2012   Mitral regurgitation 11/16/2012   Renal artery stenosis (HCC) 11/16/2012   HLD (hyperlipidemia) 11/16/2012   Essential hypertension 11/16/2012   PCP:  Okey Carlin Redbird, MD Pharmacy:   [facility] [address], [place] - [address] [place] [address] Phone: [phone] Fax: [phone]  [facility] #[id number] [place], [place] - [address] AT [facility] [address] [place] [address] Phone: [phone] Fax: [phone]  [facility] #[id number] - [place], [place] - [address] AT [facility] [address] [place] [address] Phone: [phone] Fax: [phone]     Social Drivers of Health (SDOH) Social History: SDOH Screenings   Food Insecurity: No Food Insecurity (05/22/2023)  Housing: Low Risk  (05/22/2023)  Transportation Needs: No Transportation Needs (05/22/2023)  Utilities: Not At Risk (05/22/2023)  Social Connections: Socially Isolated (05/20/2023)  Tobacco Use: Medium Risk (05/20/2023)   SDOH Interventions: Food Insecurity Interventions: Intervention Not Indicated, Inpatient TOC Housing Interventions: Intervention Not Indicated, Inpatient TOC Transportation Interventions: Intervention Not Indicated, Inpatient TOC Utilities Interventions: Intervention Not Indicated, Inpatient TOC   Readmission Risk Interventions     No data to display

## 2023-05-22 NOTE — Plan of Care (Signed)
   Problem: Clinical Measurements: Goal: Ability to maintain clinical measurements within normal limits will improve Outcome: Progressing Goal: Diagnostic test results will improve Outcome: Progressing   Problem: Safety: Goal: Ability to remain free from injury will improve Outcome: Progressing

## 2023-05-22 NOTE — Progress Notes (Signed)
 Mobility Specialist - Progress Note   05/22/23 1011  Mobility  Activity Transferred from bed to chair  Level of Assistance Minimal assist, patient does 75% or more  Assistive Device Front wheel walker  Distance Ambulated (ft) 3 ft  Range of Motion/Exercises Active  Activity Response Tolerated well  Mobility Referral Yes  Mobility visit 1 Mobility  Mobility Specialist Start Time (ACUTE ONLY) 1000  Mobility Specialist Stop Time (ACUTE ONLY) 1011  Mobility Specialist Time Calculation (min) (ACUTE ONLY) 11 min   Pt was found in bed and agreeable to transfer to recliner chair. Min-A for bed mobility and requiring cues to scoot EOB. Was CG for transfer and was left on recliner chair with all needs met. Call bell in reach and chair alarm on.  Erminio Leos Mobility Specialist

## 2023-05-23 DIAGNOSIS — E538 Deficiency of other specified B group vitamins: Secondary | ICD-10-CM | POA: Diagnosis not present

## 2023-05-23 DIAGNOSIS — N3000 Acute cystitis without hematuria: Secondary | ICD-10-CM | POA: Diagnosis not present

## 2023-05-23 DIAGNOSIS — G9341 Metabolic encephalopathy: Secondary | ICD-10-CM | POA: Diagnosis not present

## 2023-05-23 LAB — CULTURE, OB URINE: Culture: >=100000 — AB

## 2023-05-23 MED ORDER — SULFAMETHOXAZOLE-TRIMETHOPRIM 800-160 MG PO TABS
1.0000 | ORAL_TABLET | Freq: Two times a day (BID) | ORAL | Status: DC
  Administered 2023-05-23 – 2023-05-25 (×5): 1 via ORAL
  Filled 2023-05-23 (×5): qty 1

## 2023-05-23 NOTE — Plan of Care (Signed)
  Problem: Clinical Measurements: Goal: Diagnostic test results will improve Outcome: Progressing Goal: Respiratory complications will improve Outcome: Progressing   Problem: Activity: Goal: Risk for activity intolerance will decrease Outcome: Progressing   Problem: Nutrition: Goal: Adequate nutrition will be maintained Outcome: Progressing   Problem: Coping: Goal: Level of anxiety will decrease Outcome: Adequate for Discharge   Problem: Pain Management: Goal: General experience of comfort will improve Outcome: Adequate for Discharge   Problem: Safety: Goal: Ability to remain free from injury will improve Outcome: Adequate for Discharge

## 2023-05-23 NOTE — Progress Notes (Signed)
 Mobility Specialist - Progress Note   05/23/23 1016  Mobility  Activity Transferred from bed to chair  Level of Assistance Minimal assist, patient does 75% or more  Assistive Device Front wheel walker  Distance Ambulated (ft) 3 ft  Range of Motion/Exercises Active  Activity Response Tolerated well  Mobility Referral Yes  Mobility visit 1 Mobility  Mobility Specialist Start Time (ACUTE ONLY) 1000  Mobility Specialist Stop Time (ACUTE ONLY) 1016  Mobility Specialist Time Calculation (min) (ACUTE ONLY) 16 min   Pt was found in bed and agreeable to transfer to recliner chair. No complaints with session. At EOS was left on recliner chair with all needs met. Call bell in reach and chair alarm on. RN notified.  Erminio Leos Mobility Specialist

## 2023-05-23 NOTE — Plan of Care (Signed)
   Problem: Activity: Goal: Risk for activity intolerance will decrease Outcome: Progressing   Problem: Coping: Goal: Level of anxiety will decrease Outcome: Progressing   Problem: Safety: Goal: Ability to remain free from injury will improve Outcome: Progressing

## 2023-05-23 NOTE — Progress Notes (Signed)
 [facility] PROGRESS NOTE   Tammy [name] FMW:[id number] DOB: Oct 08, 1930 DOA: 05/19/2023  PCP: Tammy Carlin Redbird, MD  Brief History: 89 y.o. female with medical history significant for dementia, mild intermittent asthma, paroxysmal atrial fibrillation, essential pretension, hyperlipidemia, coronary disease status post CABG in January 1994, chronic diastolic heart failure, who was admitted to [facility] on 05/19/2023 with acute metabolic encephalopathy in the setting of severe sepsis due to acute cystitis after presenting from home to [facility] ED for evaluation of altered mental status.   Consultants: None  Procedures: None    Subjective/Interval History: Patient remains pleasantly confused.     Assessment/Plan:  Acute metabolic encephalopathy This is in the setting of underlying dementia.  Encephalopathy thought to be due to urinary tract infection and sepsis.   Mentation seems to be back to her baseline.  Based on discussions with her daughter it sounds like her dementia has been progressing over the past year.    Urinary tract infection with severe sepsis Noted to be on ceftriaxone .  Urine culture is growing Klebsiella.  Final report is available.  Sensitivities reviewed.  Noted allergy to penicillin.  Will switch her over to Bactrim  for 4 more days. WBC was elevated at 13.9 at admission.  Subsequently normal.  Lactic acid level was 0.5.  Acute kidney injury Baseline creatinine is normal.  Presented with creatinine of 1.76.  Improved with IV hydration.  Now back to normal. Encourage oral intake.  Mechanical fall No obvious injuries identified on examination.  Imaging studies also did not show any injuries.  PT and OT evaluation.  History of mild intermittent asthma Stable.  Paroxysmal atrial fibrillation Not noted to be on anticoagulation.  Continue with metoprolol . EKG from 1/5 reviewed.  Essential hypertension Furosemide  and losartan were held due to AKI.    Continue with metoprolol .  Resume losartan as well.  Recheck labs tomorrow.  Labetalol  as needed.  Hyperlipidemia Statin.  Chronic diastolic CHF Stable.    Normocytic anemia/vitamin B12 deficiency Drop in hemoglobin is noted and likely dilutional.  No overt bleeding noted.  Hemoglobin is stable.  Anemia panel shows significant B12 deficiency at 148.  Folic acid  20.6.  Ferritin 123, iron 25, TIBC 225, percent saturation 11. She was started on B12 supplementation with daily intramuscular injections for 5 days followed by oral supplementation.     DVT Prophylaxis: SCDs Code Status: CODE STATUS was changed to DNR after discussions with daughter on 1/4. Family Communication: Will update daughter later today. Disposition Plan: SNF is recommended.       Medications: Scheduled:  atorvastatin   10 mg Oral Daily   cyanocobalamin   1,000 mcg Intramuscular Daily   Followed by   [name] ON 05/26/2023] vitamin B-12  1,000 mcg Oral Daily   [name]   Topical BID   irbesartan   300 mg Oral Daily   metoprolol  succinate  50 mg Oral Daily   Continuous:  cefTRIAXone  (ROCEPHIN )  IV Stopped (05/22/23 1800)   PRN:acetaminophen  **OR** acetaminophen , albuterol , labetalol , melatonin, ondansetron  (ZOFRAN ) IV  Antibiotics: Anti-infectives (From admission, onward)    Start     Dose/Rate Route Frequency Ordered Stop   05/20/23 1600  cefTRIAXone  (ROCEPHIN ) 1 g in sodium chloride  0.9 % 100 mL IVPB        1 g 200 mL/hr over 30 Minutes Intravenous Every 24 hours 05/19/23 2128     05/19/23 1900  cefTRIAXone  (ROCEPHIN ) 1 g in sodium chloride  0.9 % 100 mL IVPB  1 g 200 mL/hr over 30 Minutes Intravenous  Once 05/19/23 1857 05/19/23 2026       Objective:  Vital Signs  Vitals:   05/22/23 1306 05/22/23 2021 05/23/23 0500 05/23/23 0930  BP: (!) 156/48 (!) 184/87 (!) 151/59 (!) 149/46  Pulse: (!) 57 68 73 72  Resp: (!) 24 18 20    Temp: 97.7 F (36.5 C) (!) 97.5 F (36.4 C) (!) 97.4 F  (36.3 C)   TempSrc: Oral Oral Axillary   SpO2: 100% 99% 97%   Weight:   58.6 kg   Height:        Intake/Output Summary (Last 24 hours) at 05/23/2023 0954 Last data filed at 05/22/2023 2346 Gross per 24 hour  Intake 640 ml  Output 600 ml  Net 40 ml   Filed Weights   05/21/23 0500 05/22/23 0500 05/23/23 0500  Weight: 58.1 kg 59 kg 58.6 kg    General appearance: Awake alert.  In no distress.  Distracted Resp: Clear to auscultation bilaterally.  Normal effort Cardio: S1-S2 is normal regular.  No S3-S4.  No rubs murmurs or bruit GI: Abdomen is soft.  Nontender nondistended.  Bowel sounds are present normal.  No masses organomegaly   Lab Results:  Data Reviewed: I have personally reviewed following labs and reports of the imaging studies  CBC: Recent Labs  Lab 05/19/23 1826 05/20/23 0522 05/21/23 0630 05/22/23 0552  WBC 13.9* 7.1 7.8 8.2  NEUTROABS 10.0* 4.3  --   --   HGB 11.9* 9.4* 9.8* 10.9*  HCT 35.8* 29.6* 31.5* 34.0*  MCV 88.8 91.4 91.8 89.5  PLT 254 173 186 217    Basic Metabolic Panel: Recent Labs  Lab 05/19/23 1826 05/20/23 0522 05/21/23 0630 05/22/23 0552  NA 137 142 141 141  K 4.7 4.4 4.3 4.4  CL 106 116* 115* 112*  CO2 21* 20* 21* 22  GLUCOSE 122* 96 104* 103*  BUN 39* 28* 23 18  CREATININE 1.76* 1.13* 0.96 0.86  CALCIUM  8.9 7.8* 8.2* 9.0  MG 2.3 1.9  --   --     GFR: Estimated Creatinine Clearance: 35.8 mL/min (by C-G formula based on SCr of 0.86 mg/dL).  Liver Function Tests: Recent Labs  Lab 05/19/23 1826 05/20/23 0522  AST 20 16  ALT 12 11  ALKPHOS 69 52  BILITOT 1.0 0.4  PROT 7.8 5.7*  ALBUMIN 3.8 2.7*    Cardiac Enzymes: Recent Labs  Lab 05/19/23 1826  CKTOTAL 224   CBG: Recent Labs  Lab 05/19/23 1823  GLUCAP 107*     Radiology Studies: No results found.      LOS: 4 days   [name] [employer] on [email or web address]  05/23/2023, 9:54 AM

## 2023-05-23 NOTE — Progress Notes (Signed)
 Mobility Specialist - Progress Note   05/23/23 1533  Mobility  Activity Ambulated with assistance in hallway  Level of Assistance Contact guard assist, steadying assist  Assistive Device Front wheel walker  Distance Ambulated (ft) 30 ft  Range of Motion/Exercises Active  Activity Response Tolerated well  Mobility Referral Yes  Mobility visit 1 Mobility  Mobility Specialist Start Time (ACUTE ONLY) 1515  Mobility Specialist Stop Time (ACUTE ONLY) 1533  Mobility Specialist Time Calculation (min) (ACUTE ONLY) 18 min   Pt was found on recliner chair and agreeable to ambulate. Requires hand placement cues when going from STS. At EOS returned to recliner chair with all needs met. Call bell in reach and chair alarm on.  Erminio Leos Mobility Specialist

## 2023-05-24 DIAGNOSIS — G9341 Metabolic encephalopathy: Secondary | ICD-10-CM | POA: Diagnosis not present

## 2023-05-24 LAB — BASIC METABOLIC PANEL
Anion gap: 8 (ref 5–15)
BUN: 18 mg/dL (ref 8–23)
CO2: 24 mmol/L (ref 22–32)
Calcium: 8.7 mg/dL — ABNORMAL LOW (ref 8.9–10.3)
Chloride: 107 mmol/L (ref 98–111)
Creatinine, Ser: 0.94 mg/dL (ref 0.44–1.00)
GFR, Estimated: 58 mL/min — ABNORMAL LOW (ref >60)
Glucose, Bld: 100 mg/dL — ABNORMAL HIGH (ref 70–99)
Potassium: 4.5 mmol/L (ref 3.5–5.1)
Sodium: 139 mmol/L (ref 135–145)

## 2023-05-24 LAB — CBC
HCT: 33.7 % — ABNORMAL LOW (ref 36.0–46.0)
Hemoglobin: 11.1 g/dL — ABNORMAL LOW (ref 12.0–15.0)
MCH: 29.2 pg (ref 26.0–34.0)
MCHC: 32.9 g/dL (ref 30.0–36.0)
MCV: 88.7 fL (ref 80.0–100.0)
Platelets: 227 10*3/uL (ref 150–400)
RBC: 3.8 MIL/uL — ABNORMAL LOW (ref 3.87–5.11)
RDW: 12.8 % (ref 11.5–15.5)
WBC: 8 10*3/uL (ref 4.0–10.5)
nRBC: 0 % (ref 0.0–0.2)

## 2023-05-24 MED ORDER — CYANOCOBALAMIN 1000 MCG PO TABS: 1000.0000 ug | ORAL_TABLET | Freq: Every day | ORAL | 3 refills | Status: AC

## 2023-05-24 MED ORDER — SULFAMETHOXAZOLE-TRIMETHOPRIM 800-160 MG PO TABS: 1.0000 | ORAL_TABLET | Freq: Two times a day (BID) | ORAL | 0 refills | Status: AC

## 2023-05-24 NOTE — TOC Progression Note (Signed)
 Transition of Care [facility]) - Progression Note    Patient Details  Name: KEYERA HATTABAUGH MRN: [id number] Date of Birth: 24-Dec-1930  Transition of Care [facility]) CM/SW Contact  Ayelen Sciortino, Nathanel, RN Phone Number: 05/24/2023, 12:36 PM  Clinical Narrative: unable to leave vm w/dtr(vm) full [name]-await choice prior auth.     Expected Discharge Plan: Skilled Nursing Facility Barriers to Discharge: Other (must enter comment) (awaiting bed choice from daughter)  Expected Discharge Plan and Services   Discharge Planning Services: CM Consult   Living arrangements for the past 2 months: Single Family Home                                       Social Determinants of Health (SDOH) Interventions SDOH Screenings   Food Insecurity: No Food Insecurity (05/22/2023)  Housing: Low Risk  (05/22/2023)  Transportation Needs: No Transportation Needs (05/22/2023)  Utilities: Not At Risk (05/22/2023)  Social Connections: Socially Isolated (05/20/2023)  Tobacco Use: Medium Risk (05/20/2023)    Readmission Risk Interventions     No data to display

## 2023-05-24 NOTE — Discharge Summary (Signed)
 [facility]  Physician Discharge Summary   Patient ID: Tammy [name] MRN: [id number] DOB/AGE: 07-10-1930 89 y.o.  Admit date: 05/19/2023 Discharge date:   05/24/2023   PCP: Okey Carlin Redbird, MD  DISCHARGE DIAGNOSES:    Acute metabolic encephalopathy, resolved   HLD (hyperlipidemia)   Essential hypertension   Paroxysmal atrial fibrillation (HCC)   AKI (acute kidney injury) (HCC), resolved   Chronic diastolic CHF (congestive heart failure) (HCC)   Acute cystitis   Mild intermittent asthma   RECOMMENDATIONS FOR OUTPATIENT FOLLOW UP: Please check CBC and basic metabolic panel in 1 week Follow-up with primary care provider Please check vitamin B12 level in 1 month.   Home Health: Going to SNF Equipment/Devices: None  CODE STATUS: DNR  DISCHARGE CONDITION: fair  Diet recommendation: Dysphagia 3 diet with thin liquids  INITIAL HISTORY: 89 y.o. female with medical history significant for dementia, mild intermittent asthma, paroxysmal atrial fibrillation, essential pretension, hyperlipidemia, coronary disease status post CABG in January 1994, chronic diastolic heart failure, who was admitted to [facility] on 05/19/2023 with acute metabolic encephalopathy in the setting of severe sepsis due to acute cystitis after presenting from home to [facility] ED for evaluation of altered mental status.   HOSPITAL COURSE:   Acute metabolic encephalopathy This is in the setting of underlying dementia.  Encephalopathy thought to be due to urinary tract infection and sepsis.   Mentation seems to be back to her baseline.  Based on discussions with her daughter it sounds like her dementia has been progressing over the past year.     Urinary tract infection with severe sepsis Noted to be on ceftriaxone .  Urine culture is growing Klebsiella.  Final report is available.  Sensitivities reviewed.  Noted allergy to penicillin.  She was switched over to Bactrim  to be taken until 05/26/2023.      Acute kidney injury Baseline creatinine is normal.  Presented with creatinine of 1.76.  Improved with IV hydration.  Now back to normal. Encourage oral intake.   Mechanical fall No obvious injuries identified on examination.  Imaging studies also did not show any injuries.  Seen by PT and OT.  SNF is recommended for short-term rehab.   History of mild intermittent asthma Stable.   Paroxysmal atrial fibrillation Not noted to be on anticoagulation.  Continue with metoprolol .  Continue aspirin .   Essential hypertension Continue with home medications.   Hyperlipidemia Statin.   Chronic diastolic CHF Stable.     Normocytic anemia/vitamin B12 deficiency Drop in hemoglobin is noted and likely dilutional.  No overt bleeding noted.  Hemoglobin is stable.  Anemia panel shows significant B12 deficiency at 148.  Folic acid  20.6.  Ferritin 123, iron 25, TIBC 225, percent saturation 11. Continue with B12 supplementation.  Would recommend rechecking levels in 1 month.  Patient is stable.  Okay for discharge to SNF when bed is available.   PERTINENT LABS:  The results of significant diagnostics from this hospitalization (including imaging, microbiology, ancillary and laboratory) are listed below for reference.    Microbiology: Recent Results (from the past 240 hours)  Culture, OB Urine     Status: Abnormal   Collection Time: 05/19/23  5:25 PM   Specimen: Urine, Random  Result Value Ref Range Status   Specimen Description   Final    URINE, RANDOM Performed at [facility], [address] [place], [place] [address]    Special Requests   Final    NONE Performed at [facility], [address] [place], [place] [address]    Culture   Final    Two isolates with different morphologies were identified as the same organism.The most resistant organism was reported. >=100,000 COLONIES/mL KLEBSIELLA PNEUMONIAE    Report Status 05/23/2023 FINAL  Final    Organism ID, Bacteria KLEBSIELLA PNEUMONIAE (A)  Final      Susceptibility   Klebsiella pneumoniae - MIC*    AMPICILLIN >=32 RESISTANT Resistant     CEFEPIME <=0.12 SENSITIVE Sensitive     CEFTRIAXONE  <=0.25 SENSITIVE Sensitive     CIPROFLOXACIN <=0.25 SENSITIVE Sensitive     GENTAMICIN <=1 SENSITIVE Sensitive     IMIPENEM 1 SENSITIVE Sensitive     NITROFURANTOIN 64 INTERMEDIATE Intermediate     TRIMETH /SULFA  <=20 SENSITIVE Sensitive     AMPICILLIN/SULBACTAM 4 SENSITIVE Sensitive     PIP/TAZO 8 SENSITIVE Sensitive ug/mL    * >=100,000 COLONIES/mL KLEBSIELLA PNEUMONIAE     Labs:   Basic Metabolic Panel: Recent Labs  Lab 05/19/23 1826 05/20/23 0522 05/21/23 0630 05/22/23 0552 05/24/23 0506  NA 137 142 141 141 139  K 4.7 4.4 4.3 4.4 4.5  CL 106 116* 115* 112* 107  CO2 21* 20* 21* 22 24  GLUCOSE 122* 96 104* 103* 100*  BUN 39* 28* 23 18 18   CREATININE 1.76* 1.13* 0.96 0.86 0.94  CALCIUM  8.9 7.8* 8.2* 9.0 8.7*  MG 2.3 1.9  --   --   --    Liver Function Tests: Recent Labs  Lab 05/19/23 1826 05/20/23 0522  AST 20 16  ALT 12 11  ALKPHOS 69 52  BILITOT 1.0 0.4  PROT 7.8 5.7*  ALBUMIN 3.8 2.7*   CBC: Recent Labs  Lab 05/19/23 1826 05/20/23 0522 05/21/23 0630 05/22/23 0552 05/24/23 0506  WBC 13.9* 7.1 7.8 8.2 8.0  NEUTROABS 10.0* 4.3  --   --   --   HGB 11.9* 9.4* 9.8* 10.9* 11.1*  HCT 35.8* 29.6* 31.5* 34.0* 33.7*  MCV 88.8 91.4 91.8 89.5 88.7  PLT 254 173 186 217 227   Cardiac Enzymes: Recent Labs  Lab 05/19/23 1826  CKTOTAL 224   BNP: BNP (last 3 results) Recent Labs    05/20/23 0522  BNP 243.2*    CBG: Recent Labs  Lab 05/19/23 1823  GLUCAP 107*     IMAGING STUDIES DG Forearm Left Result Date: 05/19/2023 CLINICAL DATA:  Recent fall with forearm pain, initial encounter EXAM: LEFT FOREARM - 2 VIEW COMPARISON:  03/26/2012 FINDINGS: Old healed fracture of the distal radial metaphysis is noted. Nonunion of a an ulnar styloid fracture is  noted as well. No acute fracture or dislocation is noted. Degenerative changes are noted in the carpal bones as well as the first [facility] joint. IMPRESSION: Healed fractures and degenerative change without acute abnormality. Electronically Signed   By: Oneil Devonshire M.D.   On: 05/19/2023 20:39   DG Pelvis Portable Result Date: 05/19/2023 CLINICAL DATA:  History of recent falls, initial encounter EXAM: PORTABLE PELVIS 1 VIEWS COMPARISON:  None Available. FINDINGS: Pelvic ring appears intact. Healed fracture is noted in the pubic symphysis on the left stable from prior CT of 2022. No acute fracture or dislocation is noted. No soft tissue abnormality is seen. IMPRESSION: No acute abnormality noted. Electronically Signed   By: Oneil Devonshire M.D.   On: 05/19/2023 20:32   DG Chest Port 1 View Result Date: 05/19/2023 CLINICAL DATA:  Weakness and history of recent falls, initial encounter EXAM: PORTABLE CHEST  1 VIEW COMPARISON:  12/11/2019 FINDINGS: Cardiac shadow is within normal limits. Postsurgical changes are noted. The lungs are well aerated bilaterally. No focal infiltrate or effusion is seen. No bony abnormality is noted. IMPRESSION: No acute abnormality seen. Electronically Signed   By: Oneil Devonshire M.D.   On: 05/19/2023 20:30   CT HEAD WO CONTRAST Result Date: 05/19/2023 CLINICAL DATA:  Altered mental status.  Fall.  History of dementia. EXAM: CT HEAD WITHOUT CONTRAST CT CERVICAL SPINE WITHOUT CONTRAST TECHNIQUE: Multidetector CT imaging of the head and cervical spine was performed following the standard protocol without intravenous contrast. Multiplanar CT image reconstructions of the cervical spine were also generated. RADIATION DOSE REDUCTION: This exam was performed according to the departmental dose-optimization program which includes automated exposure control, adjustment of the mA and/or kV according to patient size and/or use of iterative reconstruction technique. COMPARISON:  CT head and cervical spine  12/11/2019 FINDINGS: CT HEAD FINDINGS Brain: There is no evidence of an acute large territory infarct, intracranial hemorrhage, mass, midline shift, or extra-axial fluid collection. A small chronic right occipital infarct is new. Small chronic left cerebellar infarcts are also present. There is moderate cerebral atrophy. Periventricular white matter hypodensities are nonspecific but compatible with mild chronic small vessel ischemic disease. Vascular: Calcified atherosclerosis at the skull base. No hyperdense vessel. Skull: No acute fracture or suspicious osseous lesion. Sinuses/Orbits: Visualized paranasal sinuses and mastoid air cells are clear. Bilateral cataract extraction. Other: None. CT CERVICAL SPINE FINDINGS Alignment: Mild chronic reversal of the normal cervical lordosis with grade 1 anterolisthesis of C3 on C4 and C4 on C5, unchanged. Skull base and vertebrae: No acute fracture or suspicious osseous lesion. Unchanged well corticated ossicle at the tip of the C7 spinous process. Soft tissues and spinal canal: No prevertebral fluid or swelling. No visible canal hematoma. Disc levels: Mild disc degeneration. Advanced upper cervical facet arthrosis. Asymmetrically advanced right neural foraminal stenosis at C3-4. Upper chest: Clear lung apices. Other: Heavily calcified atherosclerotic plaque at the carotid bifurcations. IMPRESSION: 1. No evidence of acute intracranial abnormality or acute cervical spine fracture. 2. 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 Electronically Signed   By: Dasie Hamburg M.D.   On: 05/19/2023 20:10   CT CERVICAL SPINE WO CONTRAST Result Date: 05/19/2023 CLINICAL DATA:  Altered mental status.  Fall.  History of dementia. EXAM: CT HEAD WITHOUT CONTRAST CT CERVICAL SPINE WITHOUT CONTRAST TECHNIQUE: Multidetector CT imaging of the head and cervical spine was performed following the standard protocol without intravenous contrast. Multiplanar CT image reconstructions  of the cervical spine were also generated. RADIATION DOSE REDUCTION: This exam was performed according to the departmental dose-optimization program which includes automated exposure control, adjustment of the mA and/or kV according to patient size and/or use of iterative reconstruction technique. COMPARISON:  CT head and cervical spine 12/11/2019 FINDINGS: CT HEAD FINDINGS Brain: There is no evidence of an acute large territory infarct, intracranial hemorrhage, mass, midline shift, or extra-axial fluid collection. A small chronic right occipital infarct is new. Small chronic left cerebellar infarcts are also present. There is moderate cerebral atrophy. Periventricular white matter hypodensities are nonspecific but compatible with mild chronic small vessel ischemic disease. Vascular: Calcified atherosclerosis at the skull base. No hyperdense vessel. Skull: No acute fracture or suspicious osseous lesion. Sinuses/Orbits: Visualized paranasal sinuses and mastoid air cells are clear. Bilateral cataract extraction. Other: None. CT CERVICAL SPINE FINDINGS Alignment: Mild chronic reversal of the normal cervical lordosis with grade 1 anterolisthesis of C3 on C4  and C4 on C5, unchanged. Skull base and vertebrae: No acute fracture or suspicious osseous lesion. Unchanged well corticated ossicle at the tip of the C7 spinous process. Soft tissues and spinal canal: No prevertebral fluid or swelling. No visible canal hematoma. Disc levels: Mild disc degeneration. Advanced upper cervical facet arthrosis. Asymmetrically advanced right neural foraminal stenosis at C3-4. Upper chest: Clear lung apices. Other: Heavily calcified atherosclerotic plaque at the carotid bifurcations. IMPRESSION: 1. No evidence of acute intracranial abnormality or acute cervical spine fracture. 2. 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 Electronically Signed   By: Dasie Hamburg M.D.   On: 05/19/2023 20:10    DISCHARGE  EXAMINATION: Vitals:   05/23/23 1316 05/23/23 2032 05/24/23 0500 05/24/23 0542  BP: (!) 120/45 (!) 152/58  (!) 129/49  Pulse: (!) 56 (!) 58  67  Resp: 20 15  14   Temp: (!) 97.4 F (36.3 C) (!) 97.5 F (36.4 C)  (!) 97.4 F (36.3 C)  TempSrc: Oral Oral  Oral  SpO2: 98% 96%  100%  Weight:   58.3 kg   Height:       General appearance: Awake alert.  In no distress.  Distracted Resp: Clear to auscultation bilaterally.  Normal effort Cardio: S1-S2 is normal regular.  No S3-S4.  No rubs murmurs or bruit GI: Abdomen is soft.  Nontender nondistended.  Bowel sounds are present normal.  No masses organomegaly    DISPOSITION: SNF  Discharge Instructions     Call MD for:  difficulty breathing, headache or visual disturbances   Complete by: As directed    Call MD for:  extreme fatigue   Complete by: As directed    Call MD for:  persistant dizziness or light-headedness   Complete by: As directed    Call MD for:  persistant nausea and vomiting   Complete by: As directed    Call MD for:  severe uncontrolled pain   Complete by: As directed    Call MD for:  temperature >100.4   Complete by: As directed    Discharge instructions   Complete by: As directed    Please review instructions on the discharge summary.  You were cared for by a hospitalist during your hospital stay. If you have any questions about your discharge medications or the care you received while you were in the hospital after you are discharged, you can call the unit and asked to speak with the hospitalist on call if the hospitalist that took care of you is not available. Once you are discharged, your primary care physician will handle any further medical issues. Please note that NO REFILLS for any discharge medications will be authorized once you are discharged, as it is imperative that you return to your primary care physician (or establish a relationship with a primary care physician if you do not have one) for your aftercare  needs so that they can reassess your need for medications and monitor your lab values. If you do not have a primary care physician, you can call [phone] for a physician referral.   Increase activity slowly   Complete by: As directed    No wound care   Complete by: As directed          Allergies as of 05/24/2023       Reactions   Penicillins Hives   Has patient had a PCN reaction causing immediate rash, facial/tongue/throat swelling, SOB or lightheadedness with hypotension: No Has patient had a PCN reaction causing  severe rash involving mucus membranes or skin necrosis: Yes Has patient had a PCN reaction that required hospitalization No Has patient had a PCN reaction occurring within the last 10 years: Yes took again last year 2016 If all of the above answers are NO, then may proceed with Cephalosporin use.   Naprosyn [naproxen] Nausea Only   diarrhea   Sertraline Diarrhea   Lactose Intolerance (gi) Other (See Comments)   Unknown- [facility] documented        Medication List     STOP taking these medications    pantoprazole  40 MG tablet Commonly known as: PROTONIX        TAKE these medications    acetaminophen  325 MG tablet Commonly known as: TYLENOL  Take 2 tablets (650 mg total) by mouth every 6 (six) hours as needed for mild pain or moderate pain.   albuterol  108 (90 Base) MCG/ACT inhaler Commonly known as: VENTOLIN  HFA Inhale 2 puffs into the lungs every 6 (six) hours as needed for wheezing (cough).   aspirin  81 MG tablet Take 81 mg by mouth daily.   atorvastatin  10 MG tablet Commonly known as: LIPITOR Take 1 tablet (10 mg total) by mouth daily.   cyanocobalamin  1000 MCG tablet Take 1 tablet (1,000 mcg total) by mouth daily. Start taking on: May 26, 2023   folic acid  1 MG tablet Commonly known as: FOLVITE  Take 1 tablet (1 mg total) by mouth daily.   furosemide  20 MG tablet Commonly known as: Lasix  Take 1 tablet (20 mg  total) by mouth daily.   irbesartan  300 MG tablet Commonly known as: AVAPRO  Take 1 tablet (300 mg total) by mouth daily.   metoprolol  succinate 50 MG 24 hr tablet Commonly known as: TOPROL -XL Take 1 tablet by mouth daily.   sulfamethoxazole -trimethoprim  800-160 MG tablet Commonly known as: BACTRIM  DS Take 1 tablet by mouth every 12 (twelve) hours for 3 days.          Follow-up Information     Okey Carlin Redbird, MD. Schedule an appointment as soon as possible for a visit in 1 week(s).   Specialty: Family Medicine Why: post hospitalization follow up Contact information: [address] [place] [address] [phone]                 TOTAL DISCHARGE TIME: 35 minutes  Tanyah Debruyne Verdene  [facility] Pager on [email or web address]  05/24/2023, 9:46 AM

## 2023-05-24 NOTE — TOC Progression Note (Addendum)
 Transition of Care [facility]) - Progression Note    Patient Details  Name: Tammy [name] MRN: [id number] Date of Birth: [birth year]  [facility]) CM/SW Contact  Keesha Pellum, Nathanel, RN Phone Number: 05/24/2023, 2:41 PM  Clinical Narrative: Tammy [name] with [employer]TA rep Tammy [name] dtr will make choice today-await auth;Tammy [name](dtr) says she has own transportation.   -3:30p-dtr chose [facility]-rep aware. [employer]TA left vm of facility chosen-awaiting auth.    Expected Discharge Plan: Skilled Nursing Facility Barriers to Discharge: Insurance Authorization  Expected Discharge Plan and Services   Discharge Planning Services: CM Consult Post Acute Care Choice: Skilled Nursing Facility Living arrangements for the past 2 months: Skilled Nursing Facility                                       Social Determinants of Health (SDOH) Interventions SDOH Screenings   Food Insecurity: No Food Insecurity (05/22/2023)  Housing: Low Risk  (05/22/2023)  Transportation Needs: No Transportation Needs (05/22/2023)  Utilities: Not At Risk (05/22/2023)  Social Connections: Socially Isolated (05/20/2023)  Tobacco Use: Medium Risk (05/20/2023)    Readmission Risk Interventions     No data to display

## 2023-05-24 NOTE — Progress Notes (Signed)
 Physical Therapy Treatment Patient Details Name: Tammy [name] MRN: [id number] DOB: 19-Sep-1930 Today's Date: 05/24/2023   History of Present Illness Pt is a 89 y.o. female presenting 05/19/2023 after being found by daughter with decreased responsiveness and AMS. Admitted with acute metabolic encephalopathy in the setting of severe sepsis due to acute cystitis. PMH significant for dementia, paroxysmal a-fib, HTN HLD, CAD s/p CABO 1994, chronic diastolic heart failure.    PT Comments  Pt agreeable to working with therapy. Min A for safe mobility. Pt participated well. Dyspnea 2/4 with ambulation. Patient will benefit from continued inpatient follow up therapy, <3 hours/day     If plan is discharge home, recommend the following: A little help with walking and/or transfers;A little help with bathing/dressing/bathroom;Help with stairs or ramp for entrance;Assistance with cooking/housework;Assist for transportation   Can travel by private vehicle     Yes  Equipment Recommendations  Rolling walker (2 wheels)    Recommendations for Other Services       Precautions / Restrictions Precautions Precautions: Fall Restrictions Weight Bearing Restrictions Per Provider Order: No     Mobility  Bed Mobility               General bed mobility comments: oob in recliner    Transfers Overall transfer level: Needs assistance Equipment used: Rolling walker (2 wheels) Transfers: Sit to/from Stand     Step pivot transfers: Min assist       General transfer comment: assist to rise and steady. cues for safety, hand placement. increased time.    Ambulation/Gait Ambulation/Gait assistance: Min assist Gait Distance (Feet): 65 Feet Assistive device: Rolling walker (2 wheels) Gait Pattern/deviations: Step-through pattern, Decreased stride length       General Gait Details: Assis to stabilize pt and manage RW. Cues for safety. Tolerated distance well.   Stairs              Wheelchair Mobility     Tilt Bed    Modified Rankin (Stroke Patients Only)       Balance Overall balance assessment: Needs assistance, History of Falls         Standing balance support: Bilateral upper extremity supported, During functional activity Standing balance-Leahy Scale: Poor                              Cognition Arousal: Alert Behavior During Therapy: WFL for tasks assessed/performed Overall Cognitive Status: History of cognitive impairments - at baseline                                          Exercises      General Comments        Pertinent Vitals/Pain Pain Assessment Pain Assessment: No/denies pain    Home Living                          Prior Function            PT Goals (current goals can now be found in the care plan section) Progress towards PT goals: Progressing toward goals    Frequency    Min 1X/week      PT Plan      Co-evaluation              AM-PAC PT 6 Clicks Mobility   Outcome  Measure  Help needed turning from your back to your side while in a flat bed without using bedrails?: A Little Help needed moving from lying on your back to sitting on the side of a flat bed without using bedrails?: A Little Help needed moving to and from a bed to a chair (including a wheelchair)?: A Little Help needed standing up from a chair using your arms (e.g., wheelchair or bedside chair)?: A Little Help needed to walk in hospital room?: A Little Help needed climbing 3-5 steps with a railing? : A Lot 6 Click Score: 17    End of Session Equipment Utilized During Treatment: Gait belt Activity Tolerance: Patient tolerated treatment well Patient left: in chair;with call bell/phone within reach;with chair alarm set   PT Visit Diagnosis: Difficulty in walking, not elsewhere classified (R26.2);Muscle weakness (generalized) (M62.81)     Time: 8751-8697 PT Time Calculation (min) (ACUTE  ONLY): 14 min  Charges:    $Gait Training: 8-22 mins PT General Charges $$ ACUTE PT VISIT: 1 Visit                        Dannial SQUIBB, PT Acute Rehabilitation  Office: [phone]

## 2023-05-24 NOTE — Plan of Care (Signed)
  Problem: Activity: Goal: Risk for activity intolerance will decrease Outcome: Progressing   Problem: Nutrition: Goal: Adequate nutrition will be maintained Outcome: Progressing   Problem: Coping: Goal: Level of anxiety will decrease Outcome: Progressing   Problem: Skin Integrity: Goal: Risk for impaired skin integrity will decrease Outcome: Progressing

## 2023-05-25 DIAGNOSIS — N179 Acute kidney failure, unspecified: Secondary | ICD-10-CM | POA: Diagnosis not present

## 2023-05-25 DIAGNOSIS — Z955 Presence of coronary angioplasty implant and graft: Secondary | ICD-10-CM | POA: Diagnosis not present

## 2023-05-25 DIAGNOSIS — G9341 Metabolic encephalopathy: Secondary | ICD-10-CM | POA: Diagnosis not present

## 2023-05-25 DIAGNOSIS — R0602 Shortness of breath: Secondary | ICD-10-CM | POA: Diagnosis not present

## 2023-05-25 DIAGNOSIS — N19 Unspecified kidney failure: Secondary | ICD-10-CM | POA: Diagnosis not present

## 2023-05-25 DIAGNOSIS — A419 Sepsis, unspecified organism: Secondary | ICD-10-CM | POA: Diagnosis not present

## 2023-05-25 DIAGNOSIS — F5102 Adjustment insomnia: Secondary | ICD-10-CM | POA: Diagnosis not present

## 2023-05-25 DIAGNOSIS — I340 Nonrheumatic mitral (valve) insufficiency: Secondary | ICD-10-CM | POA: Diagnosis not present

## 2023-05-25 DIAGNOSIS — R059 Cough, unspecified: Secondary | ICD-10-CM | POA: Diagnosis not present

## 2023-05-25 DIAGNOSIS — R296 Repeated falls: Secondary | ICD-10-CM | POA: Diagnosis not present

## 2023-05-25 DIAGNOSIS — F0390 Unspecified dementia without behavioral disturbance: Secondary | ICD-10-CM | POA: Diagnosis not present

## 2023-05-25 DIAGNOSIS — I272 Pulmonary hypertension, unspecified: Secondary | ICD-10-CM | POA: Diagnosis not present

## 2023-05-25 DIAGNOSIS — R6520 Severe sepsis without septic shock: Secondary | ICD-10-CM | POA: Diagnosis not present

## 2023-05-25 DIAGNOSIS — I2510 Atherosclerotic heart disease of native coronary artery without angina pectoris: Secondary | ICD-10-CM | POA: Diagnosis not present

## 2023-05-25 DIAGNOSIS — L89302 Pressure ulcer of unspecified buttock, stage 2: Secondary | ICD-10-CM | POA: Diagnosis not present

## 2023-05-25 DIAGNOSIS — I480 Paroxysmal atrial fibrillation: Secondary | ICD-10-CM | POA: Diagnosis not present

## 2023-05-25 DIAGNOSIS — E785 Hyperlipidemia, unspecified: Secondary | ICD-10-CM | POA: Diagnosis not present

## 2023-05-25 DIAGNOSIS — I10 Essential (primary) hypertension: Secondary | ICD-10-CM | POA: Diagnosis not present

## 2023-05-25 DIAGNOSIS — J4520 Mild intermittent asthma, uncomplicated: Secondary | ICD-10-CM | POA: Diagnosis not present

## 2023-05-25 DIAGNOSIS — N3000 Acute cystitis without hematuria: Secondary | ICD-10-CM | POA: Diagnosis not present

## 2023-05-25 DIAGNOSIS — I5032 Chronic diastolic (congestive) heart failure: Secondary | ICD-10-CM | POA: Diagnosis not present

## 2023-05-25 DIAGNOSIS — F03C18 Unspecified dementia, severe, with other behavioral disturbance: Secondary | ICD-10-CM | POA: Diagnosis not present

## 2023-05-25 MED ORDER — IRBESARTAN 75 MG PO TABS: 75.0000 mg | ORAL_TABLET | Freq: Every day | ORAL | 2 refills | Status: AC

## 2023-05-25 MED ORDER — IRBESARTAN 150 MG PO TABS: 75.0000 mg | ORAL_TABLET | Freq: Every day | ORAL | Status: DC

## 2023-05-25 NOTE — Discharge Summary (Addendum)
 [facility]  Physician Discharge Summary   Patient ID: Tammy Tammy [name] MRN: [id number] DOB/AGE: 1930-05-20 89 y.o.  Admit date: 05/19/2023 Discharge date:   05/25/2023   PCP: Okey Carlin Redbird, MD  DISCHARGE DIAGNOSES:    Acute metabolic encephalopathy, resolved   HLD (hyperlipidemia)   Essential hypertension   Paroxysmal atrial fibrillation (HCC)   AKI (acute kidney injury) (HCC), resolved   Chronic diastolic CHF (congestive heart failure) (HCC)   Acute cystitis   Mild intermittent asthma   RECOMMENDATIONS FOR OUTPATIENT FOLLOW UP: Please check CBC and basic metabolic panel in 1 week Follow-up with primary care provider Please check vitamin B12 level in 1 month.   Home Health: Going to SNF Equipment/Devices: None  CODE STATUS: DNR  DISCHARGE CONDITION: fair  Diet recommendation: Dysphagia 3 diet with thin liquids  INITIAL HISTORY: 89 y.o. Tammy [name] with medical history significant for dementia, mild intermittent asthma, paroxysmal atrial fibrillation, essential pretension, hyperlipidemia, coronary disease status post CABG in January 1994, chronic diastolic heart failure, who was admitted to [facility] on 05/19/2023 with acute metabolic encephalopathy in the setting of severe sepsis due to acute cystitis after presenting from home to [facility] ED for evaluation of altered mental status.   HOSPITAL COURSE:   Acute metabolic encephalopathy This is in the setting of underlying dementia.  Encephalopathy thought to be due to urinary tract infection and sepsis.   Mentation seems to be back to her baseline.   Based on discussions with her daughter it sounds like her dementia has been progressing over the past year.     Urinary tract infection with severe sepsis Noted to be on ceftriaxone .  Urine culture is growing Klebsiella.  Final report is available.  Sensitivities reviewed.  Noted allergy to penicillin.  She was switched over to Bactrim  to be taken until 05/26/2023.      Acute kidney injury Baseline creatinine is normal.  Presented with creatinine of 1.76.  Improved with IV hydration.  Now back to normal. Encourage oral intake.   Mechanical fall No obvious injuries identified on examination.  Imaging studies also did not show any injuries.  Seen by PT and OT.  SNF is recommended for short-term rehab.   History of mild intermittent asthma Stable.   Paroxysmal atrial fibrillation Not noted to be on anticoagulation.  Continue with metoprolol .  Continue aspirin .   Essential hypertension Continue with home medications.   Hyperlipidemia Statin.   Chronic diastolic CHF Stable.     Normocytic anemia/vitamin B12 deficiency Drop in hemoglobin is noted and likely dilutional.  No overt bleeding noted.  Hemoglobin is stable.  Anemia panel shows significant B12 deficiency at 148.  Folic acid  20.6.  Ferritin 123, iron 25, TIBC 225, percent saturation 11. Continue with B12 supplementation.  Would recommend rechecking levels in 1 month.  Patient is stable.  Okay for discharge to SNF when bed is available.   PERTINENT LABS:  The results of significant diagnostics from this hospitalization (including imaging, microbiology, ancillary and laboratory) are listed below for reference.    Microbiology: Recent Results (from the past 240 hours)  Culture, OB Urine     Status: Abnormal   Collection Time: 05/19/23  5:25 PM   Specimen: Urine, Random  Result Value Ref Range Status   Specimen Description   Final    URINE, RANDOM Performed at [facility], [address] [place], [place] [address]    Special Requests   Final    NONE Performed at [facility]  [facility], [address] [place], [place] [address]    Culture   Final    Two isolates with different morphologies were identified as the same organism.The most resistant organism was reported. >=100,000 COLONIES/mL KLEBSIELLA PNEUMONIAE    Report Status 05/23/2023 FINAL  Final    Organism ID, Bacteria KLEBSIELLA PNEUMONIAE (A)  Final      Susceptibility   Klebsiella pneumoniae - MIC*    AMPICILLIN >=32 RESISTANT Resistant     CEFEPIME <=0.12 SENSITIVE Sensitive     CEFTRIAXONE  <=0.25 SENSITIVE Sensitive     CIPROFLOXACIN <=0.25 SENSITIVE Sensitive     GENTAMICIN <=1 SENSITIVE Sensitive     IMIPENEM 1 SENSITIVE Sensitive     NITROFURANTOIN 64 INTERMEDIATE Intermediate     TRIMETH /SULFA  <=20 SENSITIVE Sensitive     AMPICILLIN/SULBACTAM 4 SENSITIVE Sensitive     PIP/TAZO 8 SENSITIVE Sensitive ug/mL    * >=100,000 COLONIES/mL KLEBSIELLA PNEUMONIAE     Labs:   Basic Metabolic Panel: Recent Labs  Lab 05/19/23 1826 05/20/23 0522 05/21/23 0630 05/22/23 0552 05/24/23 0506  NA 137 142 141 141 139  K 4.7 4.4 4.3 4.4 4.5  CL 106 116* 115* 112* 107  CO2 21* 20* 21* 22 24  GLUCOSE 122* 96 104* 103* 100*  BUN 39* 28* 23 18 18   CREATININE 1.76* 1.13* 0.96 0.86 0.94  CALCIUM  8.9 7.8* 8.2* 9.0 8.7*  MG 2.3 1.9  --   --   --    Liver Function Tests: Recent Labs  Lab 05/19/23 1826 05/20/23 0522  AST 20 16  ALT 12 11  ALKPHOS 69 52  BILITOT 1.0 0.4  PROT 7.8 5.7*  ALBUMIN 3.8 2.7*   CBC: Recent Labs  Lab 05/19/23 1826 05/20/23 0522 05/21/23 0630 05/22/23 0552 05/24/23 0506  WBC 13.9* 7.1 7.8 8.2 8.0  NEUTROABS 10.0* 4.3  --   --   --   HGB 11.9* 9.4* 9.8* 10.9* 11.1*  HCT 35.8* 29.6* 31.5* 34.0* 33.7*  MCV 88.8 91.4 91.8 89.5 88.7  PLT 254 173 186 217 227   Cardiac Enzymes: Recent Labs  Lab 05/19/23 1826  CKTOTAL 224   BNP: BNP (last 3 results) Recent Labs    05/20/23 0522  BNP 243.2*    CBG: Recent Labs  Lab 05/19/23 1823  GLUCAP 107*     IMAGING STUDIES DG Forearm Left Result Date: 05/19/2023 CLINICAL DATA:  Recent fall with forearm pain, initial encounter EXAM: LEFT FOREARM - 2 VIEW COMPARISON:  03/26/2012 FINDINGS: Old healed fracture of the distal radial metaphysis is noted. Nonunion of a an ulnar styloid fracture is  noted as well. No acute fracture or dislocation is noted. Degenerative changes are noted in the carpal bones as well as the first [facility] joint. IMPRESSION: Healed fractures and degenerative change without acute abnormality. Electronically Signed   By: Oneil Devonshire M.D.   On: 05/19/2023 20:39   DG Pelvis Portable Result Date: 05/19/2023 CLINICAL DATA:  History of recent falls, initial encounter EXAM: PORTABLE PELVIS 1 VIEWS COMPARISON:  None Available. FINDINGS: Pelvic ring appears intact. Healed fracture is noted in the pubic symphysis on the left stable from prior CT of 2022. No acute fracture or dislocation is noted. No soft tissue abnormality is seen. IMPRESSION: No acute abnormality noted. Electronically Signed   By: Oneil Devonshire M.D.   On: 05/19/2023 20:32   DG Chest Port 1 View Result Date: 05/19/2023 CLINICAL DATA:  Weakness and history of recent falls, initial encounter EXAM: PORTABLE  CHEST 1 VIEW COMPARISON:  12/11/2019 FINDINGS: Cardiac shadow is within normal limits. Postsurgical changes are noted. The lungs are well aerated bilaterally. No focal infiltrate or effusion is seen. No bony abnormality is noted. IMPRESSION: No acute abnormality seen. Electronically Signed   By: Oneil Devonshire M.D.   On: 05/19/2023 20:30   CT HEAD WO CONTRAST Result Date: 05/19/2023 CLINICAL DATA:  Altered mental status.  Fall.  History of dementia. EXAM: CT HEAD WITHOUT CONTRAST CT CERVICAL SPINE WITHOUT CONTRAST TECHNIQUE: Multidetector CT imaging of the head and cervical spine was performed following the standard protocol without intravenous contrast. Multiplanar CT image reconstructions of the cervical spine were also generated. RADIATION DOSE REDUCTION: This exam was performed according to the departmental dose-optimization program which includes automated exposure control, adjustment of the mA and/or kV according to patient size and/or use of iterative reconstruction technique. COMPARISON:  CT head and cervical spine  12/11/2019 FINDINGS: CT HEAD FINDINGS Brain: There is no evidence of an acute large territory infarct, intracranial hemorrhage, mass, midline shift, or extra-axial fluid collection. A small chronic right occipital infarct is new. Small chronic left cerebellar infarcts are also present. There is moderate cerebral atrophy. Periventricular white matter hypodensities are nonspecific but compatible with mild chronic small vessel ischemic disease. Vascular: Calcified atherosclerosis at the skull base. No hyperdense vessel. Skull: No acute fracture or suspicious osseous lesion. Sinuses/Orbits: Visualized paranasal sinuses and mastoid air cells are clear. Bilateral cataract extraction. Other: None. CT CERVICAL SPINE FINDINGS Alignment: Mild chronic reversal of the normal cervical lordosis with grade 1 anterolisthesis of C3 on C4 and C4 on C5, unchanged. Skull base and vertebrae: No acute fracture or suspicious osseous lesion. Unchanged well corticated ossicle at the tip of the C7 spinous process. Soft tissues and spinal canal: No prevertebral fluid or swelling. No visible canal hematoma. Disc levels: Mild disc degeneration. Advanced upper cervical facet arthrosis. Asymmetrically advanced right neural foraminal stenosis at C3-4. Upper chest: Clear lung apices. Other: Heavily calcified atherosclerotic plaque at the carotid bifurcations. IMPRESSION: 1. No evidence of acute intracranial abnormality or acute cervical spine fracture. 2. 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 Electronically Signed   By: Dasie Hamburg M.D.   On: 05/19/2023 20:10   CT CERVICAL SPINE WO CONTRAST Result Date: 05/19/2023 CLINICAL DATA:  Altered mental status.  Fall.  History of dementia. EXAM: CT HEAD WITHOUT CONTRAST CT CERVICAL SPINE WITHOUT CONTRAST TECHNIQUE: Multidetector CT imaging of the head and cervical spine was performed following the standard protocol without intravenous contrast. Multiplanar CT image reconstructions  of the cervical spine were also generated. RADIATION DOSE REDUCTION: This exam was performed according to the departmental dose-optimization program which includes automated exposure control, adjustment of the mA and/or kV according to patient size and/or use of iterative reconstruction technique. COMPARISON:  CT head and cervical spine 12/11/2019 FINDINGS: CT HEAD FINDINGS Brain: There is no evidence of an acute large territory infarct, intracranial hemorrhage, mass, midline shift, or extra-axial fluid collection. A small chronic right occipital infarct is new. Small chronic left cerebellar infarcts are also present. There is moderate cerebral atrophy. Periventricular white matter hypodensities are nonspecific but compatible with mild chronic small vessel ischemic disease. Vascular: Calcified atherosclerosis at the skull base. No hyperdense vessel. Skull: No acute fracture or suspicious osseous lesion. Sinuses/Orbits: Visualized paranasal sinuses and mastoid air cells are clear. Bilateral cataract extraction. Other: None. CT CERVICAL SPINE FINDINGS Alignment: Mild chronic reversal of the normal cervical lordosis with grade 1 anterolisthesis of C3 on  C4 and C4 on C5, unchanged. Skull base and vertebrae: No acute fracture or suspicious osseous lesion. Unchanged well corticated ossicle at the tip of the C7 spinous process. Soft tissues and spinal canal: No prevertebral fluid or swelling. No visible canal hematoma. Disc levels: Mild disc degeneration. Advanced upper cervical facet arthrosis. Asymmetrically advanced right neural foraminal stenosis at C3-4. Upper chest: Clear lung apices. Other: Heavily calcified atherosclerotic plaque at the carotid bifurcations. IMPRESSION: 1. No evidence of acute intracranial abnormality or acute cervical spine fracture. 2. 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 Electronically Signed   By: Dasie Hamburg M.D.   On: 05/19/2023 20:10    DISCHARGE  EXAMINATION: Vitals:   05/25/23 0332 05/25/23 0500 05/25/23 1015 05/25/23 1028  BP: (!) 157/53   (!) 114/49  Pulse: 60  63 (!) 58  Resp: 14     Temp: Tammy.6 F (36.4 C)     TempSrc: Oral     SpO2: 98%     Weight:  58.6 kg    Height:       Patient is awake alert.  In no distress. S1-S2 is normal regular. Lungs are clear to auscultation bilaterally. Abdomen is soft.  Nontender nondistended.  DISPOSITION: SNF  Discharge Instructions     Call MD for:  difficulty breathing, headache or visual disturbances   Complete by: As directed    Call MD for:  extreme fatigue   Complete by: As directed    Call MD for:  persistant dizziness or light-headedness   Complete by: As directed    Call MD for:  persistant nausea and vomiting   Complete by: As directed    Call MD for:  severe uncontrolled pain   Complete by: As directed    Call MD for:  temperature >100.4   Complete by: As directed    Discharge instructions   Complete by: As directed    Please review instructions on the discharge summary.  You were cared for by a hospitalist during your hospital stay. If you have any questions about your discharge medications or the care you received while you were in the hospital after you are discharged, you can call the unit and asked to speak with the hospitalist on call if the hospitalist that took care of you is not available. Once you are discharged, your primary care physician will handle any further medical issues. Please note that NO REFILLS for any discharge medications will be authorized once you are discharged, as it is imperative that you return to your primary care physician (or establish a relationship with a primary care physician if you do not have one) for your aftercare needs so that they can reassess your need for medications and monitor your lab values. If you do not have a primary care physician, you can call [phone] for a physician referral.   Increase activity slowly   Complete  by: As directed    No wound care   Complete by: As directed          Allergies as of 05/25/2023       Reactions   Penicillins Hives   Has patient had a PCN reaction causing immediate rash, facial/tongue/throat swelling, SOB or lightheadedness with hypotension: No Has patient had a PCN reaction causing severe rash involving mucus membranes or skin necrosis: Yes Has patient had a PCN reaction that required hospitalization No Has patient had a PCN reaction occurring within the last 10 years: Yes took again last year 2016  If all of the above answers are NO, then may proceed with Cephalosporin use.   Naprosyn [naproxen] Nausea Only   diarrhea   Sertraline Diarrhea   Lactose Intolerance (gi) Other (See Comments)   Unknown- [facility] documented        Medication List     STOP taking these medications    pantoprazole  40 MG tablet Commonly known as: PROTONIX        TAKE these medications    acetaminophen  325 MG tablet Commonly known as: TYLENOL  Take 2 tablets (650 mg total) by mouth every 6 (six) hours as needed for mild pain or moderate pain.   albuterol  108 (90 Base) MCG/ACT inhaler Commonly known as: VENTOLIN  HFA Inhale 2 puffs into the lungs every 6 (six) hours as needed for wheezing (cough).   aspirin  81 MG tablet Take 81 mg by mouth daily.   atorvastatin  10 MG tablet Commonly known as: LIPITOR Take 1 tablet (10 mg total) by mouth daily.   cyanocobalamin  1000 MCG tablet Take 1 tablet (1,000 mcg total) by mouth daily. Start taking on: May 26, 2023   folic acid  1 MG tablet Commonly known as: FOLVITE  Take 1 tablet (1 mg total) by mouth daily.   furosemide  20 MG tablet Commonly known as: Lasix  Take 1 tablet (20 mg total) by mouth daily.   irbesartan  75 MG tablet Commonly known as: AVAPRO  Take 1 tablet (75 mg total) by mouth daily. Start taking on: May 26, 2023 What changed:  medication strength how much to take    metoprolol  succinate 50 MG 24 hr tablet Commonly known as: TOPROL -XL Take 1 tablet by mouth daily.   sulfamethoxazole -trimethoprim  800-160 MG tablet Commonly known as: BACTRIM  DS Take 1 tablet by mouth every 12 (twelve) hours for 3 days.          Contact information for follow-up providers     Okey Carlin Redbird, MD. Schedule an appointment as soon as possible for a visit in 1 week(s).   Specialty: Family Medicine Why: post hospitalization follow up Contact information: [address] [place] [address] [phone]              Contact information for after-discharge care     Destination     [facility] .   Service: Skilled Nursing Contact information: [address] Test Update Address [place]  [address] [phone]                     TOTAL DISCHARGE TIME: 35 minutes  Jeremian Whitby Verdene  [facility] Pager on [email or web address]  05/25/2023, 10:35 AM

## 2023-05-25 NOTE — Progress Notes (Signed)
 The patient's caregiver Shona is at the bedside on the phone with the patient's sister. The patient's sister is asking for an update on the patient's medical condition. The patient is alert and oriented only to self at this time. I called the pt's daughter Delsie) to get permission to speak with the patient's sister; however,I could not reach her, and the voicemail is full. Explained HIPAA to the caregiver and pt's sister.

## 2023-05-25 NOTE — TOC Transition Note (Addendum)
 Transition of Care [facility]) - Discharge Note   Patient Details  Name: Tammy [name] MRN: [id number] Date of Birth: [birth year]  [facility] CM/SW Contact:  Bascom Service, RN Phone Number: 05/25/2023, 11:48 AM   Clinical Narrative: Margaret Perry chose another facility since [facility] no bed until next week. Received shara #[id number]-Izaapz chose [facility] has bed available today-await rm#,report tel#. [name] to transport on own.   -1:38p-going to rm#[id number]. No further CM needs.   Final next level of care: Skilled Nursing Facility Barriers to Discharge: No Barriers Identified   Patient Goals and CMS Choice Patient states their goals for this hospitalization and ongoing recovery are:: pt's dtr says she will d/c to SNF CMS Medicare.gov Compare Post Acute Care list provided to:: Patient Represenative (must comment) ([name] [name] (dtr))   [place] ownership interest in [facility].provided to:: Adult Children    Discharge Placement PASRR number recieved: 05/23/23            Patient chooses bed at: [facility] [place] Patient to be transferred to facility by: Claretta Perry Name of family member notified: [name](dtr)t Patient and family notified of of transfer: 05/25/23  Discharge Plan and Services Additional resources added to the After Visit Summary for     Discharge Planning Services: CM Consult Post Acute Care Choice: Skilled Nursing Facility                               Social Drivers of Health (SDOH) Interventions SDOH Screenings   Food Insecurity: No Food Insecurity (05/22/2023)  Housing: Low Risk  (05/22/2023)  Transportation Needs: No Transportation Needs (05/22/2023)  Utilities: Not At Risk (05/22/2023)  Social Connections: Socially Isolated (05/20/2023)  Tobacco Use: Medium Risk (05/20/2023)     Readmission Risk Interventions     No data to display

## 2023-06-01 DIAGNOSIS — L89302 Pressure ulcer of unspecified buttock, stage 2: Secondary | ICD-10-CM | POA: Diagnosis not present

## 2023-07-08 ENCOUNTER — Encounter (HOSPITAL_COMMUNITY): Payer: Self-pay

## 2023-07-08 ENCOUNTER — Other Ambulatory Visit: Payer: Self-pay

## 2023-07-08 ENCOUNTER — Emergency Department (HOSPITAL_COMMUNITY): Payer: PPO

## 2023-07-08 ENCOUNTER — Emergency Department (HOSPITAL_COMMUNITY)
Admission: EM | Admit: 2023-07-08 | Discharge: 2023-07-13 | Disposition: A | Discharge status: Home / Self Care | Payer: PPO | Attending: Emergency Medicine | Admitting: Emergency Medicine

## 2023-07-08 DIAGNOSIS — R0602 Shortness of breath: Secondary | ICD-10-CM | POA: Diagnosis not present

## 2023-07-08 DIAGNOSIS — J45909 Unspecified asthma, uncomplicated: Secondary | ICD-10-CM | POA: Diagnosis not present

## 2023-07-08 DIAGNOSIS — I2510 Atherosclerotic heart disease of native coronary artery without angina pectoris: Secondary | ICD-10-CM | POA: Diagnosis not present

## 2023-07-08 DIAGNOSIS — R944 Abnormal results of kidney function studies: Secondary | ICD-10-CM | POA: Insufficient documentation

## 2023-07-08 DIAGNOSIS — W19XXXA Unspecified fall, initial encounter: Secondary | ICD-10-CM | POA: Diagnosis not present

## 2023-07-08 DIAGNOSIS — Z7982 Long term (current) use of aspirin: Secondary | ICD-10-CM | POA: Diagnosis not present

## 2023-07-08 DIAGNOSIS — Z79899 Other long term (current) drug therapy: Secondary | ICD-10-CM | POA: Diagnosis not present

## 2023-07-08 DIAGNOSIS — R000 Tachycardia, unspecified: Secondary | ICD-10-CM | POA: Diagnosis not present

## 2023-07-08 DIAGNOSIS — F0390 Unspecified dementia without behavioral disturbance: Secondary | ICD-10-CM | POA: Diagnosis not present

## 2023-07-08 DIAGNOSIS — R531 Weakness: Secondary | ICD-10-CM | POA: Insufficient documentation

## 2023-07-08 DIAGNOSIS — M8448XA Pathological fracture, other site, initial encounter for fracture: Secondary | ICD-10-CM

## 2023-07-08 DIAGNOSIS — I10 Essential (primary) hypertension: Secondary | ICD-10-CM | POA: Diagnosis not present

## 2023-07-08 LAB — CBC WITH DIFFERENTIAL/PLATELET
Abs Immature Granulocytes: 0.04 10*3/uL (ref 0.00–0.07)
Basophils Absolute: 0.1 10*3/uL (ref 0.0–0.1)
Basophils Relative: 1 %
Eosinophils Absolute: 0.1 10*3/uL (ref 0.0–0.5)
Eosinophils Relative: 1 %
HCT: 40.4 % (ref 36.0–46.0)
Hemoglobin: 11.2 g/dL — ABNORMAL LOW (ref 12.0–15.0)
Immature Granulocytes: 0 %
Lymphocytes Relative: 14 %
Lymphs Abs: 1.4 10*3/uL (ref 0.7–4.0)
MCH: 27.3 pg (ref 26.0–34.0)
MCHC: 27.7 g/dL — ABNORMAL LOW (ref 30.0–36.0)
MCV: 98.3 fL (ref 80.0–100.0)
Monocytes Absolute: 0.9 10*3/uL (ref 0.1–1.0)
Monocytes Relative: 8 %
Neutro Abs: 7.8 10*3/uL — ABNORMAL HIGH (ref 1.7–7.7)
Neutrophils Relative %: 76 %
Platelets: 333 10*3/uL (ref 150–400)
RBC: 4.11 MIL/uL (ref 3.87–5.11)
RDW: 14.3 % (ref 11.5–15.5)
WBC: 10.3 10*3/uL (ref 4.0–10.5)
nRBC: 0 % (ref 0.0–0.2)

## 2023-07-08 LAB — HEPATIC FUNCTION PANEL
ALT: 21 U/L (ref 0–44)
AST: 36 U/L (ref 15–41)
Albumin: 3.5 g/dL (ref 3.5–5.0)
Alkaline Phosphatase: 88 U/L (ref 38–126)
Bilirubin, Direct: 0.2 mg/dL (ref 0.0–0.2)
Indirect Bilirubin: 0.7 mg/dL (ref 0.3–0.9)
Total Bilirubin: 0.9 mg/dL (ref 0.0–1.2)
Total Protein: 8.1 g/dL (ref 6.5–8.1)

## 2023-07-08 LAB — URINALYSIS, W/ REFLEX TO CULTURE (INFECTION SUSPECTED)
Bacteria, UA: NONE SEEN
Bilirubin Urine: NEGATIVE
Glucose, UA: NEGATIVE mg/dL
Hgb urine dipstick: NEGATIVE
Ketones, ur: NEGATIVE mg/dL
Leukocytes,Ua: NEGATIVE
Nitrite: NEGATIVE
Protein, ur: NEGATIVE mg/dL
Specific Gravity, Urine: 1.018 (ref 1.005–1.030)
pH: 5 (ref 5.0–8.0)

## 2023-07-08 LAB — RESP PANEL BY RT-PCR (RSV, FLU A&B, COVID)  RVPGX2
Influenza A by PCR: NEGATIVE
Influenza B by PCR: NEGATIVE
Resp Syncytial Virus by PCR: NEGATIVE
SARS Coronavirus 2 by RT PCR: NEGATIVE

## 2023-07-08 LAB — BASIC METABOLIC PANEL
Anion gap: 15 (ref 5–15)
BUN: 23 mg/dL (ref 8–23)
CO2: 18 mmol/L — ABNORMAL LOW (ref 22–32)
Calcium: 8.7 mg/dL — ABNORMAL LOW (ref 8.9–10.3)
Chloride: 106 mmol/L (ref 98–111)
Creatinine, Ser: 1.1 mg/dL — ABNORMAL HIGH (ref 0.44–1.00)
GFR, Estimated: 48 mL/min — ABNORMAL LOW (ref >60)
Glucose, Bld: 113 mg/dL — ABNORMAL HIGH (ref 70–99)
Potassium: 4.1 mmol/L (ref 3.5–5.1)
Sodium: 139 mmol/L (ref 135–145)

## 2023-07-08 MED ORDER — ACETAMINOPHEN 160 MG/5ML PO SOLN
650.0000 mg | ORAL | Status: DC | PRN
  Administered 2023-07-08 – 2023-07-13 (×5): 650 mg via ORAL
  Filled 2023-07-08 (×6): qty 20.3

## 2023-07-08 MED ORDER — SODIUM CHLORIDE 0.9 % IV BOLUS
500.0000 mL | Freq: Once | INTRAVENOUS | Status: AC
  Administered 2023-07-08: 500 mL via INTRAVENOUS

## 2023-07-08 MED ORDER — ACETAMINOPHEN 325 MG PO TABS
650.0000 mg | ORAL_TABLET | Freq: Once | ORAL | Status: DC
  Filled 2023-07-08: qty 2

## 2023-07-08 NOTE — Discharge Instructions (Addendum)
It was a pleasure caring for you today. please follow-up with your primary care provider.  Seek emergency care if experiencing any new or worsening symptoms.

## 2023-07-08 NOTE — ED Provider Notes (Signed)
 [place] EMERGENCY DEPARTMENT AT [facility] Provider Note   CSN: [id number] Arrival date & time: 07/08/23  1703     History  Chief Complaint  Patient presents with   Mancel Bale GRAHAM DOUKAS is a 89 y.o. female with PMHx essential tremor, afib, CAD, asthma, GERD, dementia, HLD, HTN, IBS who presents to ED concerned for progressive weakness x3-4 days. Patient was recently admitted 05/19/2023 for urosepsis and was discharged to SNF.  Patient was discharged from [facility] 07/03/2023.  Patient did fine for the initial 2 days - but then patient had 2 falls on Tuesday and has been slowly decreasing in mobility/activity since then. Family member at bedside stating that home health has been visiting patient 8 hours daily whenever family member is at work. PT came out to assess patient today as stated that patient needs further assessment for her new generalized weakness.   Fall       Home Medications Prior to Admission medications   Medication Sig Start Date End Date Taking? Authorizing Provider  acetaminophen (TYLENOL) 325 MG tablet Take 2 tablets (650 mg total) by mouth every 6 (six) hours as needed for mild pain or moderate pain. Patient taking differently: Take 325 mg by mouth in the morning. 12/16/19  Yes Rai, Ripudeep K, MD  albuterol (VENTOLIN HFA) 108 (90 Base) MCG/ACT inhaler Inhale 2 puffs into the lungs every 6 (six) hours as needed for wheezing (cough). 02/11/20  Yes Ngetich, Dinah C, NP  aspirin EC 81 MG tablet Take 81 mg by mouth daily. Swallow whole.   Yes [provider]  atorvastatin (LIPITOR) 10 MG tablet Take 1 tablet (10 mg total) by mouth daily. 02/11/20  Yes Ngetich, Dinah C, NP  FOLIC ACID PO Take 1 tablet by mouth in the morning.   Yes [provider]  furosemide (LASIX) 20 MG tablet Take 1 tablet (20 mg total) by mouth daily. 02/11/20  Yes Ngetich, Dinah C, NP  irbesartan (AVAPRO) 75 MG tablet Take 1 tablet (75 mg total) by mouth daily. 05/26/23  Yes  Osvaldo Shipper, MD  metoprolol succinate (TOPROL-XL) 50 MG 24 hr tablet Take 50 mg by mouth daily.   Yes [provider]  cyanocobalamin 1000 MCG tablet Take 1 tablet (1,000 mcg total) by mouth daily. Patient not taking: Reported on 07/08/2023 05/26/23   Osvaldo Shipper, MD  folic acid (FOLVITE) 1 MG tablet Take 1 tablet (1 mg total) by mouth daily. Patient not taking: Reported on 07/08/2023 01/06/16   Cathren Harsh, MD      Allergies    Penicillins, Naproxen, Sertraline, Tape, and Lactose intolerance (gi)    Review of Systems   Review of Systems  Neurological:  Positive for weakness.    Physical Exam Updated Vital Signs BP (!) 133/55   Pulse (!) 127   Temp 98.2 F (36.8 C) (Oral)   Resp 20   SpO2 (!) 88%  Physical Exam Vitals and nursing note reviewed.  Constitutional:      General: She is not in acute distress.    Appearance: She is not ill-appearing or toxic-appearing.  HENT:     Head: Normocephalic and atraumatic.     Mouth/Throat:     Mouth: Mucous membranes are moist.  Eyes:     General: No scleral icterus.       Right eye: No discharge.        Left eye: No discharge.     Conjunctiva/sclera: Conjunctivae normal.  Cardiovascular:  Rate and Rhythm: Regular rhythm. Tachycardia present.     Pulses: Normal pulses.     Heart sounds: Normal heart sounds. No murmur heard. Pulmonary:     Effort: Pulmonary effort is normal. No respiratory distress.     Breath sounds: Normal breath sounds. No wheezing, rhonchi or rales.  Abdominal:     General: Abdomen is flat. Bowel sounds are normal. There is no distension.     Palpations: Abdomen is soft. There is no mass.     Tenderness: There is no abdominal tenderness.  Musculoskeletal:     Right lower leg: No edema.     Left lower leg: No edema.  Skin:    General: Skin is warm and dry.     Findings: No rash.  Neurological:     General: No focal deficit present.     Mental Status: She is alert. Mental status is at  baseline.     Coordination: Coordination normal.     Comments: GCS 15. Speech is goal oriented. No deficits appreciated to CN III-XII; symmetric eyebrow raise, no facial drooping, tongue midline. Patient has equal grip strength bilaterally with 4/5 strength against resistance in all major muscle groups bilaterally. Sensation to light touch intact. Patient moves extremities without ataxia.    Psychiatric:        Mood and Affect: Mood normal.     Comments: Pleasantly demented at baseline     ED Results / Procedures / Treatments   Labs (all labs ordered are listed, but only abnormal results are displayed) Labs Reviewed  CBC WITH DIFFERENTIAL/PLATELET - Abnormal; Notable for the following components:      Result Value   Hemoglobin 11.2 (*)    MCHC 27.7 (*)    Neutro Abs 7.8 (*)    All other components within normal limits  BASIC METABOLIC PANEL - Abnormal; Notable for the following components:   CO2 18 (*)    Glucose, Bld 113 (*)    Creatinine, Ser 1.10 (*)    Calcium 8.7 (*)    GFR, Estimated 48 (*)    All other components within normal limits  RESP PANEL BY RT-PCR (RSV, FLU A&B, COVID)  RVPGX2  URINALYSIS, W/ REFLEX TO CULTURE (INFECTION SUSPECTED)  HEPATIC FUNCTION PANEL    EKG None  Radiology CT Cervical Spine Wo Contrast Result Date: 07/08/2023 CLINICAL DATA:  Fall, neck trauma EXAM: CT CERVICAL SPINE WITHOUT CONTRAST TECHNIQUE: Multidetector CT imaging of the cervical spine was performed without intravenous contrast. Multiplanar CT image reconstructions were also generated. RADIATION DOSE REDUCTION: This exam was performed according to the departmental dose-optimization program which includes automated exposure control, adjustment of the mA and/or kV according to patient size and/or use of iterative reconstruction technique. COMPARISON:  None Available. FINDINGS: Alignment: 3 mm of degenerative anterolisthesis of C3 on C4 and C4 on C5. Skull base and vertebrae: Area of  sclerosis within the superior endplate of T2, new since prior study. This likely reflects healing compression fracture. No acute fracture. Sclerotic area within the C7 vertebral body and left pedicle, stable, likely bone island. Soft tissues and spinal canal: No prevertebral fluid or swelling. No visible canal hematoma. Disc levels: Degenerative disc disease with disc space narrowing and anterior spurring. Bilateral moderate to advanced degenerative facet disease, left greater than right. Upper chest: No acute findings. Other: None IMPRESSION: Area of sclerosis within the superior endplate of T2, new since prior study, likely healing slight compression fracture. No acute bony abnormality. Degenerative disc and facet disease.  Electronically Signed   By: Charlett Nose M.D.   On: 07/08/2023 20:15   CT Head Wo Contrast Result Date: 07/08/2023 CLINICAL DATA:  Head trauma, fall EXAM: CT HEAD WITHOUT CONTRAST TECHNIQUE: Contiguous axial images were obtained from the base of the skull through the vertex without intravenous contrast. RADIATION DOSE REDUCTION: This exam was performed according to the departmental dose-optimization program which includes automated exposure control, adjustment of the mA and/or kV according to patient size and/or use of iterative reconstruction technique. 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 There is atrophy and chronic small vessel disease changes. No acute intracranial abnormality. Specifically, no hemorrhage, hydrocephalus, mass lesion, acute infarction, or significant intracranial injury. Vascular: No hyperdense vessel or unexpected calcification. Skull: No acute calvarial abnormality. Sinuses/Orbits: No acute findings Other: None IMPRESSION: Atrophy, chronic microvascular disease. No acute intracranial abnormality. Electronically Signed   By: Charlett Nose M.D.   On: 07/08/2023 20:11   DG Hip Unilat W or Wo Pelvis 2-3 Views  Left Result Date: 07/08/2023 CLINICAL DATA:  Multiple falls, left hip and groin pain EXAM: DG HIP (WITH OR WITHOUT PELVIS) 2-3V LEFT COMPARISON:  08/25/2020 FINDINGS: Frontal view of the pelvis as well as frontal and cross-table lateral views of the left hip are obtained. Prior healed left superior and inferior pubic rami fractures. No acute displaced fracture, subluxation, or dislocation. Mild symmetrical bilateral hip osteoarthritis. Marked degenerative changes within the lower lumbar spine. IMPRESSION: 1. No acute displaced fracture. 2. Symmetrical bilateral hip osteoarthritis. 3. Prior healed left superior and inferior pubic rami fractures. Electronically Signed   By: Sharlet Salina M.D.   On: 07/08/2023 19:08   DG Chest Port 1 View Result Date: 07/08/2023 CLINICAL DATA:  Short of breath, multiple falls EXAM: PORTABLE CHEST 1 VIEW COMPARISON:  05/19/2023 FINDINGS: Single frontal view of the chest demonstrates postsurgical changes from median sternotomy. The cardiac silhouette is unremarkable. Pulmonary vascular congestion stress that mild pulmonary vascular congestion without airspace disease, effusion, or pneumothorax. No acute displaced fracture. IMPRESSION: 1. Mild pulmonary vascular congestion.  No acute airspace disease. Electronically Signed   By: Sharlet Salina M.D.   On: 07/08/2023 19:06    Procedures Procedures    Medications Ordered in ED Medications  acetaminophen (TYLENOL) 160 MG/5ML solution 650 mg (650 mg Oral Given 07/08/23 2222)  sodium chloride 0.9 % bolus 500 mL (500 mLs Intravenous New Bag/Given 07/08/23 2143)    ED Course/ Medical Decision Making/ A&P Clinical Course as of 07/08/23 2347  Fri Jul 08, 2023  1806 StablePA SMAdmitted for AMS in January. Recently discharged from SNF for this admission. Witnessed falls x2 Home Health PT came out  today and stated she needs to be in a higher level care.  [CC]    Clinical Course User Index [CC] Glyn Ade, MD                                  Medical Decision Making Amount and/or Complexity of Data Reviewed Labs: ordered. Radiology: ordered.  Risk OTC drugs.   This patient presents to the ED for concern of weakness, this involves an extensive number of treatment options, and is a complaint that carries with it a high risk of complications and morbidity.  The differential diagnosis includes Ischemic stroke, intracerebral hemorrhage, subarachnoid hemorrhage, Guillain-Barr syndrome, hypoglycemia, electrolyte abnormality, sepsis, ACS, carbon monoxide poisoning, anemia, dehydration.   Co morbidities that complicate  the patient evaluation  essential tremor, afib, CAD, asthma, GERD, dementia, HLD, HTN, IBS   Additional history obtained:  Additional history obtained from 05/2023 ED note: patient admitted for metabolic encephalopathy and urosepsis    Problem List / ED Course / Critical interventions / Medication management  Patient presents to ED concerned for progressive weakness x3-4 days. Patient does have home health assistance at home when her family member is at work. PT assessed patient today at home and recommended further workup for her deconditioning. Patient with mild tachycardia during her ED stay which seems to be d/t her decreased appetite and decreased food intake. Offered patient drink here in ED which she happily accepted and is tolerating well. Rest of vitals reassuring. Physical exam with some generalized weakness, but is overall unremarkable.  I Ordered, and personally interpreted labs. UA without concern for infection. resp panel negative.  CMP with slight elevation in Cr at 1.10 which I believe is d/t patient's mild dehydration. CO2 low at 18 without anion gap. CBC with mild anemia - hgb 11.2. no leukocytosis.  The patient was maintained on a cardiac monitor.  I personally viewed and interpreted the EKG/cardiac monitored which showed an underlying rhythm of: sinus tachycardia I ordered  imaging studies including pelvis xray, chest xray, CT head/cervical spine. I independently visualized and interpreted imaging which showed no acute process . I agree with the radiologist interpretation. Patient's imaging and lab workup today was reassuring. She is mildly tachycardic, but this seems to be d/t mild dehydration as her infectious workup today was unremarkable. I will provide patient with a small dose of IV fluids and I encouraged patient to continue drinking fluids orally.  I shared results with patient and family member at bedside and answered all questions. Patient does not meet criteria for admission from a medical standpoint at this time. I submitted TOC consult to further assist patient with her needs and to possibly help patient get readmitted to SNF. Plan at the end of my shift is to discharge patient home and have SW reach out to her tomorrow. Patient and family member verbalizes understanding of plan. Staffed with Dr. Doran Durand I have reviewed the patients home medicines and have made adjustments as needed   Social Determinants of Health:  none         Final Clinical Impression(s) / ED Diagnoses Final diagnoses:  Fall, initial encounter    Rx / DC Orders ED Discharge Orders     None         Margarita Rana 07/08/23 2347    Glyn Ade, MD 07/11/23 1452

## 2023-07-08 NOTE — ED Triage Notes (Signed)
EMS reports from home, fall on 18th X 2 with Hx of multiple falls. Pt c/o hip and groin pain. No obvious injuries or blood thinners. At [facility] recently for PT. Decreased ambulatory status since. Home health left Pt in chair since yesterday. Hx of dementia.  BP 1362/74 HR 114 RR 28 Sp02 94 RA CBG 147

## 2023-07-09 ENCOUNTER — Emergency Department (HOSPITAL_COMMUNITY): Payer: PPO

## 2023-07-09 MED ORDER — LIDOCAINE 5 % EX PTCH
1.0000 | MEDICATED_PATCH | CUTANEOUS | Status: DC
  Administered 2023-07-09 – 2023-07-12 (×4): 1 via TRANSDERMAL
  Filled 2023-07-09 (×8): qty 1

## 2023-07-09 NOTE — ED Notes (Signed)
 Patient reports continued and worsening pain in left hip and bilateral legs. Patient reports tylenol that was given previously is not helping.  EDP notified.

## 2023-07-09 NOTE — ED Provider Notes (Addendum)
 Patient has been holding in the ED for possible placement.  Physical therapy was not able to bear assessment.  Social work indicates patient would not be able to to go for SNF placement if she is not can to participate for physical therapy.  Family has been asking about this placement.  I do not see particular medical condition that suggests she needs hospice placement.  Patient has been complaining of pain in her pelvis area.  No focal swelling or tenderness noted on ecam bilateral lower extremity.  Will proceed with hip ct to rule out occult fx   Linwood Dibbles, MD 07/09/23 1618  CT scan is negative for acute pelvic fracture.  Pt does have a nondisplaced sacral insufficiency fracture.   Linwood Dibbles, MD 07/09/23 [phone]

## 2023-07-09 NOTE — Progress Notes (Addendum)
 CSW attempted to contact pt's daughter, Tammy [name]. Left HIPAA Compliant voicemail.   Addend @ 4:01 PM CSW spoke with pt's daughter who expressed concern about pt's pain in her leg, hip, and pelvis. Daughter is uncertain whether pt needs "skilled or palliative." She reported pt wa discharged to [facility] from [facility] in January (per chart review, pt was admitted 1/2-1/8) and pt was discharged from [facility] on last Sunday. Daughter expressed concern for ongoing decline. CSW noted PT eval and pt refused to participate. CSW did inform daughter that if pt were to go to SNF, a new PT eval with participation would be needed. Daughter verbalized understanding but is unsure if she wants SNF vs returning pt home with palliative. Daughter open to pt returning to [facility] if SNF route.   CSW staffed case with RNCM who will outreach to daughter to discuss. RNCM initially stated that EDP's typically advise on whether pt is palliative or hospice appropriate. RNCM to outreach to EDP if needed.

## 2023-07-09 NOTE — Progress Notes (Signed)
 Will follow for PT eval to determine disposition.

## 2023-07-09 NOTE — ED Provider Notes (Signed)
 Emergency Medicine Observation Re-evaluation Note  Tammy [name] is a 89 y.o. female, seen on rounds today.  Pt initially presented to the ED for complaints of Fall Currently, the patient is sleeping.  Physical Exam  BP 126/87   Pulse 86   Temp 98 F (36.7 C)   Resp (!) 27   SpO2 94%  Physical Exam General: NAD Cardiac: Normal rate Lungs: No increased work of breathing Psych: Sleeping  ED Course / MDM  EKG:   I have reviewed the labs performed to date as well as medications administered while in observation.  Recent changes in the last 24 hours include none.  Plan  Current plan is for social work consult for nursing home placement.    Rolan Bucco, MD 07/09/23 [phone]

## 2023-07-09 NOTE — Evaluation (Signed)
 Physical Therapy Evaluation Patient Details Name: Tammy [name] MRN: [id number] DOB: 1930/07/28 Today's Date: 07/09/2023  History of Present Illness  Pt is 89 yo female admitted on 07/08/23 with new generalized weakness.   All imaging negative for acute changes. Pt with recent hospital admission for urosepsis on 05/19/23 and was d/c to SNF until 07/03/23.  Pt did well initially but has since became weaker and falling. HHPT had recommended higher level of care.  Pt with hx including but not limited to essential tremor, afib, CAD, asthma, GERD, dementia, HLD, HTN, IBS  Clinical Impression  Pt admitted with above diagnosis. Pt unable to provide PLOF but per per notes/prior admission she was ambulatory and had returned home with supervision until the past few days had became weak and unable to mobilize with HHPT recommending higher level of care.  Today's evaluation very limited due to pt's cognition and refusal of mobility.  Tried multiple methods to encourage and educate but pt refusing and asking to be left alone.  Did observe bil UE movement that appeared equal, resistance to LE movement with some limitation to ROM (unsure if tight or pt resisting), and pt partially rolling in bed.  She does appear deconditioned and weak.   Does appear below her baseline.  Pt currently with functional limitations due to the deficits listed below (see PT Problem List). Pt will benefit from acute skilled PT to increase their independence and safety with mobility to allow discharge.  Patient will benefit from continued inpatient follow up therapy, <3 hours/day at d/c.         If plan is discharge home, recommend the following: A lot of help with walking and/or transfers;A lot of help with bathing/dressing/bathroom   Can travel by private vehicle   No    Equipment Recommendations Wheelchair cushion (measurements PT);Wheelchair (measurements PT)  Recommendations for Other Services       Functional Status Assessment  Patient has had a recent decline in their functional status and demonstrates the ability to make significant improvements in function in a reasonable and predictable amount of time.     Precautions / Restrictions Precautions Precautions: Fall      Mobility  Bed Mobility Overal bed mobility: Needs Assistance      Pt did partially roll in bed with supervision       General bed mobility comments: refused    Transfers                        Ambulation/Gait                  Stairs            Wheelchair Mobility     Tilt Bed    Modified Rankin (Stroke Patients Only)       Balance Overall balance assessment: Needs assistance     Sitting balance - Comments: refused                                     Pertinent Vitals/Pain Pain Assessment Pain Assessment: Faces Faces Pain Scale: Hurts little more Pain Location: generalized Pain Descriptors / Indicators: Grimacing, Guarding Pain Intervention(s): Limited activity within patient's tolerance, Monitored during session    Home Living Family/patient expects to be discharged to:: Skilled nursing facility  Additional Comments: Pt is from home, she had recent stay at [facility].  Pt unable to provide hx.  Per chart pt has 8 hr home care while family at work and was getting HHPT.    Prior Function Prior Level of Function : Needs assist;Patient poor historian/Family not available             Mobility Comments: Pt unable to state.  Per last admission limited ambulator and pt did ambulate 57' with min A last admission in January ADLs Comments: Per last admission: "Pt reports she does not use a RW or rollator at baseline. Pt normally able to perofrm ADL at home. wears pull ups at home"     Extremity/Trunk Assessment   Upper Extremity Assessment Upper Extremity Assessment: Difficult to assess due to impaired cognition (Pt refusing most request.  She did squeeze  therapist fingers (equal bil) does have obvious arthritis in hands.  Pt randomly moving both arms up to comb hair with hands.)    Lower Extremity Assessment Lower Extremity Assessment: Difficult to assess due to impaired cognition (Pt again refusing most movements.  She did wiggle toes some.  Tried DF and is not quite able to get to neutral vs pt resisting.  Tried knee flexion/heel slide but pt refusing)    Cervical / Trunk Assessment Cervical / Trunk Assessment: Other exceptions Cervical / Trunk Exceptions: In supine but appears kyphotic, leans to R.  Is able to turn head.  Communication   Communication Factors Affecting Communication: Hearing impaired    Cognition Arousal: Lethargic Behavior During Therapy: Agitated   PT - Cognitive impairments: No family/caregiver present to determine baseline, History of cognitive impairments                       PT - Cognition Comments: Pt lethargic but arousable.  She was oriented to self only.  Pt needing max encouragement/ education but still limited participation stating "I said leave me alone, I'm old and want to rest."  Tried educating on importance of mobility and that nearly lunch try - need to just try some mobility, but she refused. Following commands: Impaired       Cueing       General Comments      Exercises     Assessment/Plan    PT Assessment Patient needs continued PT services  PT Problem List Decreased strength;Decreased coordination;Pain;Decreased range of motion;Decreased cognition;Decreased activity tolerance;Decreased knowledge of use of DME;Decreased balance;Decreased mobility       PT Treatment Interventions DME instruction;Therapeutic exercise;Gait training;Balance training;Functional mobility training;Therapeutic activities;Patient/family education;Modalities    PT Goals (Current goals can be found in the Care Plan section)  Acute Rehab PT Goals Patient Stated Goal: pt unable PT Goal Formulation:  Patient unable to participate in goal setting Time For Goal Achievement: 07/23/23 Potential to Achieve Goals: Fair    Frequency Min 1X/week     Co-evaluation               AM-PAC PT "6 Clicks" Mobility  Outcome Measure Help needed turning from your back to your side while in a flat bed without using bedrails?: Total Help needed moving from lying on your back to sitting on the side of a flat bed without using bedrails?: Total Help needed moving to and from a bed to a chair (including a wheelchair)?: Total Help needed standing up from a chair using your arms (e.g., wheelchair or bedside chair)?: Total Help needed to walk in hospital room?:  Total Help needed climbing 3-5 steps with a railing? : Total 6 Click Score: 6    End of Session   Activity Tolerance: Other (comment) (cognition/self limiting) Patient left: in bed;with call bell/phone within reach;with bed alarm set Nurse Communication: Mobility status PT Visit Diagnosis: Other abnormalities of gait and mobility (R26.89);Muscle weakness (generalized) (M62.81)    Time: 8657-8469 PT Time Calculation (min) (ACUTE ONLY): 10 min   Charges:   PT Evaluation $PT Eval Low Complexity: 1 Low   PT General Charges $$ ACUTE PT VISIT: 1 Visit         Anise Salvo, PT Acute Rehab [facility] [phone]   Rayetta Humphrey 07/09/2023, 12:22 PM

## 2023-07-09 NOTE — ED Notes (Addendum)
 Physical Therapy attempted to do evaluation. Patient would not follow commands and did not cooperate. Patient stated "leave me alone." Physical Therapy tells this nurse that patient is not safe to discharge home at this time. EDP notified.

## 2023-07-09 NOTE — Care Management (Signed)
 Called daughter Tammy [name] to discuss  disposition, no answer and mailbox full could not leave a message. Called TCU [phone] and they transferred to daughter.   Daughter states  that  she is unable to meet her mothers needs at home she works still, would like her to go to [facility] with palliative services. No particular agency for palliative.  Discussed that the provider came in and are getting some CT scans due to continuing pain.  Discussed that she could possibly be in co-pay days and likely will be boarding until Monday. Will follow patient, send Fl2 and fax out

## 2023-07-09 NOTE — ED Notes (Signed)
 Physical therapy at bedside

## 2023-07-09 NOTE — NC FL2 (Signed)
 [place] MEDICAID FL2 LEVEL OF CARE FORM     IDENTIFICATION  Patient Name: Tammy [name] Birthdate: [birth year] Sex: female Admission Date (Current Location): 07/08/2023  [facility] [place] [facility] [place] Number:  Producer, television/film/video and Address:  [facility],  [address], [place] [address]      Provider Number: [id number]  Attending Physician Name and Address:  No att. providers found  Relative Name and Phone Number:  Caroleen Hamman [phone]    Current Level of Care: Hospital Recommended Level of Care: Skilled Nursing Facility Prior Approval Number:    Date Approved/Denied:   PASRR Number: [id number]  Discharge Plan: SNF    Current Diagnoses: Patient Active Problem List   Diagnosis Date Noted   Acute metabolic encephalopathy 05/19/2023   Severe sepsis (HCC) 05/19/2023   Acute cystitis 05/19/2023   Acute prerenal azotemia 05/19/2023   Mild intermittent asthma 05/19/2023   GERD (gastroesophageal reflux disease)    Delirium 12/18/2019   Chronic diastolic CHF (congestive heart failure) (HCC) 12/18/2019   History of rhabdomyolysis 12/18/2019   Recurrent falls 12/18/2019   Malnutrition of moderate degree 12/14/2019   Elevated troponin 12/11/2019   Essential tremor 03/16/2018   Dementia without behavioral disturbance (HCC) 11/08/2017   Gait difficulty 11/08/2017   Left groin pain 12/09/2016   Acute respiratory failure with hypoxia (HCC) 12/08/2016   Closed fracture of right distal radius 03/16/2016   Hypothermia 02/20/2016   SIRS (systemic inflammatory response syndrome) (HCC) 02/19/2016   Diverticulosis    Encephalopathy    Hyponatremia 02/14/2016   Acute encephalopathy 02/14/2016   Elevated lactic acid level 02/14/2016   Dizziness 02/14/2016   Abdominal pain 02/14/2016   Symptomatic bradycardia 02/12/2016   Leukocytosis 02/12/2016   Depression 02/12/2016   Renal insufficiency    SOB (shortness of breath)    Bradycardia on ECG 02/11/2016    Atrial fibrillation with RVR (HCC) 01/01/2016   Paroxysmal atrial fibrillation (HCC) 01/01/2016   Alcohol dependence with withdrawal with complication (HCC) 01/01/2016   Fall at home, initial encounter 01/01/2016   AKI (acute kidney injury) [facility])    Dehydration    Exertional dyspnea 09/11/2015   Chronic diarrhea 08/07/2015   Defecation urgency 08/07/2015   Family history of colon cancer 08/07/2015   Fecal incontinence 08/07/2015   Memory loss 11/13/2013   Abnormal involuntary movement 11/13/2013   Pulmonary hypertension (HCC) 11/02/2013   Generalized weakness 11/02/2013   Coronary artery disease involving native coronary artery of native heart with angina pectoris (HCC) 11/16/2012   Mitral regurgitation 11/16/2012   Renal artery stenosis (HCC) 11/16/2012   HLD (hyperlipidemia) 11/16/2012   Essential hypertension 11/16/2012    Orientation RESPIRATION BLADDER Height & Weight     Self  Normal Continent Weight:   Height:     BEHAVIORAL SYMPTOMS/MOOD NEUROLOGICAL BOWEL NUTRITION STATUS      Continent Diet (See discharge summary)  AMBULATORY STATUS COMMUNICATION OF NEEDS Skin   Limited Assist Verbally Normal                       Personal Care Assistance Level of Assistance  Total care       Total Care Assistance: Limited assistance   Functional Limitations Info             SPECIAL CARE FACTORS FREQUENCY  PT (By licensed PT), OT (By licensed OT)     PT Frequency: 5 X a week OT Frequency: 5 x Week  Contractures Contractures Info: Not present    Additional Factors Info                  Current Medications (07/09/2023):  This is the current hospital active medication list Current Facility-Administered Medications  Medication Dose Route Frequency Provider Last Rate Last Admin   acetaminophen (TYLENOL) 160 MG/5ML solution 650 mg  650 mg Oral Q4H PRN Valrie Hart F, PA-C   650 mg at 07/09/23 1334   Current Outpatient Medications   Medication Sig Dispense Refill   acetaminophen (TYLENOL) 325 MG tablet Take 2 tablets (650 mg total) by mouth every 6 (six) hours as needed for mild pain or moderate pain. (Patient taking differently: Take 325 mg by mouth in the morning.)     albuterol (VENTOLIN HFA) 108 (90 Base) MCG/ACT inhaler Inhale 2 puffs into the lungs every 6 (six) hours as needed for wheezing (cough). 18 g 0   aspirin EC 81 MG tablet Take 81 mg by mouth daily. Swallow whole.     atorvastatin (LIPITOR) 10 MG tablet Take 1 tablet (10 mg total) by mouth daily. 30 tablet 0   FOLIC ACID PO Take 1 tablet by mouth in the morning.     furosemide (LASIX) 20 MG tablet Take 1 tablet (20 mg total) by mouth daily. 30 tablet 0   irbesartan (AVAPRO) 75 MG tablet Take 1 tablet (75 mg total) by mouth daily. 30 tablet 2   metoprolol succinate (TOPROL-XL) 50 MG 24 hr tablet Take 50 mg by mouth daily.     cyanocobalamin 1000 MCG tablet Take 1 tablet (1,000 mcg total) by mouth daily. (Patient not taking: Reported on 07/08/2023) 30 tablet 3   folic acid (FOLVITE) 1 MG tablet Take 1 tablet (1 mg total) by mouth daily. (Patient not taking: Reported on 07/08/2023)       Discharge Medications: Please see discharge summary for a list of discharge medications.  Relevant Imaging Results:  Relevant Lab Results:   Additional Information    Lockie Pares, RN

## 2023-07-10 NOTE — Progress Notes (Signed)
 CSW attempted to contact [facility] to review pt for SNF, no answer sent message. TOC to follow.   Valentina Shaggy.Tyrail Grandfield, MSW, LCSWA [facility]  Transitions of Care Clinical Social Worker I Direct Dial: [phone]  Fax: [phone] [email or web address]

## 2023-07-10 NOTE — ED Notes (Signed)
 Patient resting in her bed with her eyes closed. Tammy [name]

## 2023-07-10 NOTE — ED Notes (Signed)
 Patient alert and sitting up in bed. No complaints or needs at present. JRPRN

## 2023-07-10 NOTE — ED Notes (Signed)
 Patients daughter at bedside visiting and talking with patient. JRPRN

## 2023-07-10 NOTE — ED Provider Notes (Signed)
 Emergency Medicine Observation Re-evaluation Note  Tammy [name] is a 89 y.o. female, seen on rounds today.  Pt initially presented to the ED for complaints of Fall Currently, the patient is laying in bed.  Physical Exam  BP (!) 146/79 (BP Location: Left Arm)   Pulse 87   Temp 98.6 F (37 C) (Oral)   Resp 18   SpO2 99%  Physical Exam General: No acute distress Cardiac: Normal rate Lungs: No increased work of breathing Psych: Pleasant, calm  ED Course / MDM  EKG:   I have reviewed the labs performed to date as well as medications administered while in observation.  Recent changes in the last 24 hours include none.  Plan  Current plan is for nursing home placement.    Rolan Bucco, MD 07/10/23 [phone]

## 2023-07-11 MED ORDER — ACETAMINOPHEN 325 MG PO TABS
325.0000 mg | ORAL_TABLET | Freq: Every morning | ORAL | Status: DC
  Administered 2023-07-11 – 2023-07-12 (×2): 325 mg via ORAL
  Filled 2023-07-11 (×3): qty 1

## 2023-07-11 MED ORDER — IRBESARTAN 75 MG PO TABS
75.0000 mg | ORAL_TABLET | Freq: Every day | ORAL | Status: DC
  Administered 2023-07-11 – 2023-07-13 (×3): 75 mg via ORAL
  Filled 2023-07-11 (×3): qty 1

## 2023-07-11 MED ORDER — FOLIC ACID 1 MG PO TABS
1.0000 mg | ORAL_TABLET | Freq: Every morning | ORAL | Status: DC
  Administered 2023-07-11 – 2023-07-13 (×3): 1 mg via ORAL
  Filled 2023-07-11 (×3): qty 1

## 2023-07-11 MED ORDER — ATORVASTATIN CALCIUM 10 MG PO TABS
10.0000 mg | ORAL_TABLET | Freq: Every day | ORAL | Status: DC
  Administered 2023-07-11 – 2023-07-13 (×3): 10 mg via ORAL
  Filled 2023-07-11 (×3): qty 1

## 2023-07-11 MED ORDER — METOPROLOL SUCCINATE ER 50 MG PO TB24
50.0000 mg | ORAL_TABLET | Freq: Every day | ORAL | Status: DC
  Administered 2023-07-11 – 2023-07-13 (×3): 50 mg via ORAL
  Filled 2023-07-11 (×3): qty 1

## 2023-07-11 MED ORDER — ASPIRIN 81 MG PO TBEC
81.0000 mg | DELAYED_RELEASE_TABLET | Freq: Every day | ORAL | Status: DC
  Administered 2023-07-11 – 2023-07-13 (×3): 81 mg via ORAL
  Filled 2023-07-11 (×3): qty 1

## 2023-07-11 MED ORDER — FUROSEMIDE 20 MG PO TABS
20.0000 mg | ORAL_TABLET | Freq: Every day | ORAL | Status: DC
  Administered 2023-07-11 – 2023-07-13 (×3): 20 mg via ORAL
  Filled 2023-07-11 (×3): qty 1

## 2023-07-11 NOTE — Progress Notes (Addendum)
 CSW spoke with French Ana at [facility] who is requesting clarity on if patient needs STR or LTC.  CSW spoke with patient's Eunice Blase to discuss discharge plan. [name] states patient was recently at [facility] for STR and was discharged home. Eunice Blase states patient recently had the flu and has not fully recovered. [name] states patient is active with [facility] for [facility]. Eunice Blase states she wants to attempt to obtain [facility] auth for STR at [facility].  CSW returned call to French Ana who is agreeable to offer a bed for STR which is accepted.  CSW will initiate insurance authorization from [facility].  CSW spoke with [place], PT to request she see patient as the last PT note was 2/22.  Edwin Dada, MSW, LCSW Transitions of Care  Clinical Social Worker II [phone]

## 2023-07-11 NOTE — Progress Notes (Signed)
 Physical Therapy Treatment Patient Details Name: Tammy [name] MRN: [id number] DOB: 01-04-1931 Today's Date: 07/11/2023   History of Present Illness Pt is 89 yo female admitted on 07/08/23 with new generalized weakness.   All imaging negative for acute changes. Pt with recent hospital admission for urosepsis on 05/19/23 and was d/c to SNF until 07/03/23.  Pt did well initially but has since became weaker and falling. HHPT had recommended higher level of care.  Pt with hx including but not limited to essential tremor, afib, CAD, asthma, GERD, dementia, HLD, HTN, IBS    PT Comments  Pt needing significant increased time to mobilize, max multimodal cues, redirection to task at hand as pt appears to forget quickly. Pt needing min A to mobilize LLE to EOB and upright trunk into sitting, able to power to stand with min A and bil HHA. Pt takes sidesteps up [facility] then returns to sitting and supine with mod A to lift LLE and reposition. Pt with significant improvement in mobility compared to eval, though still needing encouragement, education and cues.   If plan is discharge home, recommend the following: A lot of help with walking and/or transfers;A lot of help with bathing/dressing/bathroom   Can travel by private vehicle     No  Equipment Recommendations  Wheelchair cushion (measurements PT);Wheelchair (measurements PT)    Recommendations for Other Services       Precautions / Restrictions Precautions Precautions: Fall Restrictions Weight Bearing Restrictions Per Provider Order: No     Mobility  Bed Mobility Overal bed mobility: Needs Assistance Bed Mobility: Supine to Sit, Sit to Supine     Supine to sit: Min assist Sit to supine: Mod assist   General bed mobility comments: pt needing significantly increased time to come to sitting EOB, self mobilizing BLE but with increased pain when mobilizing LLE, assist to upright trunk and cues to use bedrails to assist; significant increased time to  return to supine, mod A to lift LLE back into bed    Transfers Overall transfer level: Needs assistance Equipment used: 1 person hand held assist Transfers: Sit to/from Stand Sit to Stand: Min assist           General transfer comment: initially appearts to be resistant needing heavy education and redirection to task, min A to steady and power up once pt verbalizes understanding, steadiness improves with time, able to take 2 sidesteps up to [facility] before return to sitting    Ambulation/Gait                   Stairs             Wheelchair Mobility     Tilt Bed    Modified Rankin (Stroke Patients Only)       Balance Overall balance assessment: Needs assistance Sitting-balance support: Feet supported Sitting balance-Leahy Scale: Fair     Standing balance support: Reliant on assistive device for balance, During functional activity, Bilateral upper extremity supported Standing balance-Leahy Scale: Poor                              Communication Communication Communication: Impaired Factors Affecting Communication: Hearing impaired  Cognition Arousal: Alert Behavior During Therapy: WFL for tasks assessed/performed   PT - Cognitive impairments: No family/caregiver present to determine baseline, History of cognitive impairments  Following commands: Impaired Following commands impaired: Follows one step commands inconsistently    Cueing    Exercises      General Comments        Pertinent Vitals/Pain      Home Living                          Prior Function            PT Goals (current goals can now be found in the care plan section) Acute Rehab PT Goals Patient Stated Goal: pt unable PT Goal Formulation: Patient unable to participate in goal setting Time For Goal Achievement: 07/23/23 Potential to Achieve Goals: Fair Progress towards PT goals: Progressing toward goals     Frequency    Min 1X/week      PT Plan      Co-evaluation              AM-PAC PT "6 Clicks" Mobility   Outcome Measure  Help needed turning from your back to your side while in a flat bed without using bedrails?: A Little Help needed moving from lying on your back to sitting on the side of a flat bed without using bedrails?: A Little Help needed moving to and from a bed to a chair (including a wheelchair)?: A Little Help needed standing up from a chair using your arms (e.g., wheelchair or bedside chair)?: A Little Help needed to walk in hospital room?: Total Help needed climbing 3-5 steps with a railing? : Total 6 Click Score: 14    End of Session Equipment Utilized During Treatment: Gait belt Activity Tolerance: Patient tolerated treatment well Patient left: in bed;with bed alarm set Nurse Communication: Mobility status PT Visit Diagnosis: Other abnormalities of gait and mobility (R26.89);Muscle weakness (generalized) (M62.81)     Time: 1610-9604 PT Time Calculation (min) (ACUTE ONLY): 29 min  Charges:    $Therapeutic Activity: 23-37 mins PT General Charges $$ ACUTE PT VISIT: 1 Visit                     Tori Majel Giel PT, DPT 07/11/23, 12:59 PM

## 2023-07-11 NOTE — ED Provider Notes (Signed)
 Emergency Medicine Observation Re-evaluation Note  Tammy [name] is a 89 y.o. female, seen on rounds today.  Pt initially presented to the ED for complaints of Fall Currently, the patient is in room without complaint..  Physical Exam  BP (!) 163/74 (BP Location: Left Arm)   Pulse 83   Temp 97.7 F (36.5 C) (Axillary)   Resp 20   SpO2 95%  Physical Exam Vitals reviewed.  Pulmonary:     Effort: Pulmonary effort is normal.  Musculoskeletal:     Cervical back: Normal range of motion.  Neurological:     General: No focal deficit present.    ED Course / MDM  EKG:EKG Interpretation Date/Time:  Friday July 08 2023 17:16:27 EST Ventricular Rate:  114 PR Interval:  156 QRS Duration:  84 QT Interval:  335 QTC Calculation: 462 R Axis:   50  Text Interpretation: Sinus tachycardia Confirmed by Marily Memos [phone]) on 07/11/2023 12:32:51 AM  I have reviewed the labs performed to date as well as medications administered while in observation.  Recent changes in the last 24 hours include no changes, still pending placement.  Plan  Current plan is for SNF placement.    Anders Simmonds T, DO 07/11/23 [phone]

## 2023-07-12 NOTE — Progress Notes (Signed)
 CSW received call from [place] at [facility] who states a peer to peer has been offered - MD to call Dr. Logan Bores at [phone] prior to 12pm on 07/13/23.  CSW informed MD of information.  Edwin Dada, MSW, LCSW Transitions of Care  Clinical Social Worker II [phone]

## 2023-07-12 NOTE — ED Provider Notes (Addendum)
 Emergency Medicine Observation Re-evaluation Note  Tammy [name] is a 89 y.o. female, seen on rounds today.  Pt initially presented to the ED for complaints of Fall Currently, the patient is asleep in her room.  Physical Exam  BP (!) 129/58 (BP Location: Left Arm)   Pulse 71   Temp 98.1 F (36.7 C) (Axillary)   Resp 16   SpO2 99%  Physical Exam General: No acute distress Cardiac: Normal rate Lungs: Normal effort Psych: Unable to assess  ED Course / MDM  EKG:EKG Interpretation Date/Time:  Friday July 08 2023 17:16:27 EST Ventricular Rate:  114 PR Interval:  156 QRS Duration:  84 QT Interval:  335 QTC Calculation: 462 R Axis:   50  Text Interpretation: Sinus tachycardia Confirmed by Marily Memos [phone]) on 07/11/2023 12:32:51 AM  I have reviewed the labs performed to date as well as medications administered while in observation.  Recent changes in the last 24 hours include patient has been accepted to a long-term care facility, just waiting on a bed.  Plan  Current plan is for transfer to long-term care facility.  I did a peer to peer on the patient with Dr. Logan Bores.  Patient is approved for long-term care.    Anders Simmonds T, DO 07/12/23 0724    Anders Simmonds T, DO 07/12/23 1501

## 2023-07-13 DIAGNOSIS — Z7401 Bed confinement status: Secondary | ICD-10-CM | POA: Diagnosis not present

## 2023-07-13 DIAGNOSIS — R4182 Altered mental status, unspecified: Secondary | ICD-10-CM | POA: Diagnosis not present

## 2023-07-13 DIAGNOSIS — I959 Hypotension, unspecified: Secondary | ICD-10-CM | POA: Diagnosis not present

## 2023-07-13 NOTE — Progress Notes (Signed)
   [facility] Liaison Note:  Notified by [facility] manager of patient/family request for [facility] Palliative services at home after discharge.   Please call with any hospice or outpatient palliative care related questions.   Thank you for the opportunity to participate in this patient's care.   Glenna Fellows, BSN, RN, OCN [employer] [phone]

## 2023-07-13 NOTE — Progress Notes (Addendum)
 Tammy [name] with [facility] states pt wishes to do home care and transition pt to LTC later. Tammy [name] will staff with his RN to see how soon they can assist and develop a plan of care. TOC following.   Addend @ 1:10PM Tammy [name] reported his team will go ahead and create a care plan based on referral sent via email. They will begin assigning care givers and an RN will assess pt in the home today. Daughter has requested [facility] transport. [facility] called.

## 2023-07-13 NOTE — ED Notes (Signed)
 Called [name], Tammy [name] (Daughter) [phone] and informed of [facility] transport at facility and estimated ETA to home.

## 2023-07-13 NOTE — Progress Notes (Addendum)
 CSW attempted to contact [place] with [employer] to confirm pt was approved for SNF and PTAR. Left HIPAA Compliant voicemail requesting call back.   Addend @ 9:59AM CSW received call back from Tammy [name] with [facility] who states their system is having technical issues and she will call this Clinical research associate back.   CSW awaiting auth information for SNF and PTAR. TOC following.

## 2023-07-13 NOTE — Care Management (Addendum)
 Per chart review patient SNF insurance [facility] has been denied. Per [facility] ED SW patient's daughter is agreeable to [facility] services at discharge. This RNCM spoke with patient's daughter Eunice Blase who reports she would like HH services and needs assistance with taking care of her mother, as she is the only child.This RNCM offered HH choice and patient's daughter reports she will accept any agency that accepts her insurance. She was with [facility] previously and it took a week for them to see patient. This RNCM explained SW has made a referral for [facility] to assist with LTC placement. [name] reports she previously used [employer] for private duty services and does not wish to use them again. Eunice Blase is also requesting assistance for palliative services. This RNCM offered choice for outpt palliaitve, daughter chose [facility].    This RNCM spoke with Amy with [facility] who will follow patient in the community for HHPT/OT/HHA with a start date of tomorrow.  Notified EDP of need for HHPT/OT, HHA orders.   - 1:17pm This RNCM spoke with Shawn with [facility] to refer patient for outpatient palliaitve services. Shawn accepted and will follow patient in the community. EMS has been called, RN notified as patient's daughter is requesting patient be dressed in the clothes that she left in patient's locker.    TOC will continue to follow for discharge needs.

## 2023-07-13 NOTE — ED Provider Notes (Addendum)
 Emergency Medicine Observation Re-evaluation Note  Tammy [name] is a 89 y.o. female, seen on rounds today.  Pt initially presented to the ED for complaints of Fall Currently, the patient is asleep in her room.  Physical Exam  BP (!) 137/46 (BP Location: Left Arm)   Pulse 67   Temp 97.6 F (36.4 C) (Oral)   Resp 16   SpO2 100%  Physical Exam General: No acute distress Lungs: Normal effort Psych: resting comfortably  ED Course / MDM  EKG:EKG Interpretation Date/Time:  Friday July 08 2023 17:16:27 EST Ventricular Rate:  114 PR Interval:  156 QRS Duration:  84 QT Interval:  335 QTC Calculation: 462 R Axis:   50  Text Interpretation: Sinus tachycardia Confirmed by Tammy [name] [phone]) on 07/11/2023 12:32:51 AM  I have reviewed the labs performed to date as well as medications administered while in observation.  Recent changes in the last 24 hours include patient has been accepted to a long-term care facility, just waiting on a bed. Peer to peer yesterday and patient approved for long term care.  [name]  Current [name] is for transfer to long-term care facility.     Patient has now been cleared for discharge.  Home health orders were placed.    Tammy Plan, DO 07/13/23 1335

## 2023-07-13 NOTE — Progress Notes (Addendum)
 CSW received call back from Tammy w/[facility] who reports the pt was denied by their medical director for SNF. Tammy to fax over denial letter in case family wishes to appeal. Eunice Blase declined to appeal denial stating "I trust her insurance."  CSW spoke with pt's daughter, Eunice Blase, who states she does have difficulty taking care of the pt at home and needs help. [name] agreeable to [facility] services being continued through [facility]. RNCM to assist. CSW offered referral to [facility] for Home Care and assistance with finding LTC. Daughter accepted.   CSW spoke with Jeannett Senior with [facility] to complete referral and will email referral packet.

## 2023-07-20 DIAGNOSIS — R627 Adult failure to thrive: Secondary | ICD-10-CM | POA: Diagnosis not present

## 2023-07-20 DIAGNOSIS — F324 Major depressive disorder, single episode, in partial remission: Secondary | ICD-10-CM | POA: Diagnosis not present

## 2023-07-20 DIAGNOSIS — J449 Chronic obstructive pulmonary disease, unspecified: Secondary | ICD-10-CM | POA: Diagnosis not present

## 2023-07-20 DIAGNOSIS — R296 Repeated falls: Secondary | ICD-10-CM | POA: Diagnosis not present

## 2023-07-20 DIAGNOSIS — F0390 Unspecified dementia without behavioral disturbance: Secondary | ICD-10-CM | POA: Diagnosis not present

## 2023-07-20 DIAGNOSIS — Z515 Encounter for palliative care: Secondary | ICD-10-CM | POA: Diagnosis not present

## 2023-07-20 DIAGNOSIS — Z7189 Other specified counseling: Secondary | ICD-10-CM | POA: Diagnosis not present

## 2023-07-20 DIAGNOSIS — W19XXXD Unspecified fall, subsequent encounter: Secondary | ICD-10-CM | POA: Diagnosis not present
